# Patient Record
Sex: Male | Born: 1935 | Race: White | Hispanic: No | Smoking: Former smoker
Health system: Southern US, Community
[De-identification: ages and names within clinical notes are randomized; demographics above are authoritative.]

## PROBLEM LIST (undated history)

## (undated) DIAGNOSIS — N189 Chronic kidney disease, unspecified: Secondary | ICD-10-CM

## (undated) DIAGNOSIS — E7800 Pure hypercholesterolemia, unspecified: Secondary | ICD-10-CM

## (undated) DIAGNOSIS — R195 Other fecal abnormalities: Secondary | ICD-10-CM

## (undated) DIAGNOSIS — I10 Essential (primary) hypertension: Secondary | ICD-10-CM

## (undated) DIAGNOSIS — K589 Irritable bowel syndrome without diarrhea: Secondary | ICD-10-CM

## (undated) DIAGNOSIS — I2510 Atherosclerotic heart disease of native coronary artery without angina pectoris: Secondary | ICD-10-CM

## (undated) DIAGNOSIS — C801 Malignant (primary) neoplasm, unspecified: Secondary | ICD-10-CM

## (undated) DIAGNOSIS — K219 Gastro-esophageal reflux disease without esophagitis: Secondary | ICD-10-CM

## (undated) DIAGNOSIS — C61 Malignant neoplasm of prostate: Secondary | ICD-10-CM

## (undated) HISTORY — PX: OTHER SURGICAL HISTORY: SHX169

## (undated) HISTORY — DX: Chronic kidney disease, unspecified: N18.9

## (undated) HISTORY — PX: SKIN CANCER EXCISION: SHX779

## (undated) HISTORY — DX: Pure hypercholesterolemia, unspecified: E78.00

## (undated) HISTORY — DX: Atherosclerotic heart disease of native coronary artery without angina pectoris: I25.10

## (undated) HISTORY — PX: RADIOACTIVE SEED IMPLANT: SHX5150

## (undated) HISTORY — DX: Other fecal abnormalities: R19.5

## (undated) HISTORY — DX: Malignant neoplasm of prostate: C61

## (undated) HISTORY — DX: Irritable bowel syndrome, unspecified: K58.9

## (undated) HISTORY — DX: Essential (primary) hypertension: I10

## (undated) HISTORY — PX: CHOLECYSTECTOMY: SHX55

## (undated) HISTORY — DX: Malignant (primary) neoplasm, unspecified: C80.1

## (undated) HISTORY — DX: Gastro-esophageal reflux disease without esophagitis: K21.9

---

## 1998-06-12 ENCOUNTER — Inpatient Hospital Stay (HOSPITAL_COMMUNITY): Admission: EM | Admit: 1998-06-12 | Discharge: 1998-06-16 | Payer: Self-pay | Admitting: Emergency Medicine

## 1998-06-12 ENCOUNTER — Encounter: Payer: Self-pay | Admitting: Emergency Medicine

## 1998-06-13 ENCOUNTER — Encounter: Payer: Self-pay | Admitting: Emergency Medicine

## 1998-06-14 ENCOUNTER — Encounter: Payer: Self-pay | Admitting: General Surgery

## 2000-07-24 ENCOUNTER — Ambulatory Visit (HOSPITAL_COMMUNITY): Admission: RE | Admit: 2000-07-24 | Discharge: 2000-07-24 | Payer: Self-pay | Admitting: Ophthalmology

## 2002-03-08 ENCOUNTER — Ambulatory Visit: Admission: RE | Admit: 2002-03-08 | Discharge: 2002-06-04 | Payer: Self-pay | Admitting: Radiation Oncology

## 2002-03-25 ENCOUNTER — Encounter: Payer: Self-pay | Admitting: Urology

## 2002-03-25 ENCOUNTER — Encounter: Admission: RE | Admit: 2002-03-25 | Discharge: 2002-03-25 | Payer: Self-pay | Admitting: Urology

## 2002-05-03 ENCOUNTER — Encounter: Payer: Self-pay | Admitting: Urology

## 2002-05-03 ENCOUNTER — Ambulatory Visit (HOSPITAL_BASED_OUTPATIENT_CLINIC_OR_DEPARTMENT_OTHER): Admission: RE | Admit: 2002-05-03 | Discharge: 2002-05-03 | Payer: Self-pay | Admitting: Urology

## 2004-06-27 HISTORY — PX: CORONARY ARTERY BYPASS GRAFT: SHX141

## 2004-07-08 ENCOUNTER — Encounter: Admission: RE | Admit: 2004-07-08 | Discharge: 2004-10-06 | Payer: Self-pay | Admitting: Emergency Medicine

## 2004-07-15 ENCOUNTER — Inpatient Hospital Stay (HOSPITAL_BASED_OUTPATIENT_CLINIC_OR_DEPARTMENT_OTHER): Admission: RE | Admit: 2004-07-15 | Discharge: 2004-07-15 | Payer: Self-pay | Admitting: Cardiology

## 2004-07-19 ENCOUNTER — Inpatient Hospital Stay (HOSPITAL_COMMUNITY): Admission: AD | Admit: 2004-07-19 | Discharge: 2004-07-26 | Payer: Self-pay | Admitting: Cardiology

## 2004-09-28 ENCOUNTER — Encounter
Admission: RE | Admit: 2004-09-28 | Discharge: 2004-09-28 | Payer: Self-pay | Admitting: Thoracic Surgery (Cardiothoracic Vascular Surgery)

## 2005-10-17 ENCOUNTER — Encounter (INDEPENDENT_AMBULATORY_CARE_PROVIDER_SITE_OTHER): Payer: Self-pay | Admitting: Specialist

## 2005-10-17 ENCOUNTER — Ambulatory Visit (HOSPITAL_BASED_OUTPATIENT_CLINIC_OR_DEPARTMENT_OTHER): Admission: RE | Admit: 2005-10-17 | Discharge: 2005-10-17 | Payer: Self-pay | Admitting: Urology

## 2006-10-26 ENCOUNTER — Encounter: Payer: Self-pay | Admitting: Cardiology

## 2010-04-14 ENCOUNTER — Ambulatory Visit: Payer: Self-pay | Admitting: Cardiology

## 2010-11-12 NOTE — Cardiovascular Report (Signed)
NAME:  COSTA, JHA NO.:  [id number]   MEDICAL RECORD NO.:  [id number]          PATIENT TYPE:  OIB   LOCATION:  6501                         FACILITY:  [facility]   PHYSICIAN:  Peter M. Swaziland, M.D.  DATE OF BIRTH:  11/03/1932   DATE OF PROCEDURE:  07/15/2004  DATE OF DISCHARGE:                              CARDIAC CATHETERIZATION   INDICATIONS FOR PROCEDURE:  A 77 year old white male with history of  diabetes, hyperlipidemia, and hypertension who presents with symptoms of  dyspnea on exertion. He has an abnormal stress test.   PROCEDURES:  Left heart catheterization, coronary and left ventricular  angiography access via the right femoral artery using standard Seldinger  technique.   EQUIPMENT USED:  A  4-French, 4-cm, right-and-left Judkins catheter; 4-  French, pigtail catheter; 4-French arterial sheath   MEDICATIONS:  Local anesthesia 1% Xylocaine.   CONTRAST:  90 mL of Omnipaque.   HEMODYNAMIC DATA:  1.  Aortic pressure is 149/70 with mean of 101 mmHg.  2.  Left ventricular pressure was 142 with EDP of 15 mmHg.   ANGIOGRAPHIC DATA:  1.  The left coronary arises and distributes normally.  The left main      coronary is normal.  2.  The left anterior descending artery is heavily calcified proximally. It      is occluded after the takeoff of the first septal perforator. There are      good collaterals right-to-left from the right ventricular branch of the      right coronary. There also faint collaterals, left-to-left from the      obtuse marginal branch of the circumflex.  3.  The left circumflex coronary gives rise to a very large obtuse marginal      branch. Then continues and gives off a smaller obtuse marginal branch      before terminating in the AV groove.  In the proximal circumstance      circumflex, there is very complex 90% stenosis prior to the takeoff of      the first obtuse marginal with a large aneurysmal segment and large      aneurysmal  segment at the takeoff of the first obtuse marginal branch.      The first marginal branch is diffusely diseased up to 70% proximally. At      its bifurcation in the mid vessel there is a 90% bifurcation stenosis.      The left circumflex continues after the first marginal branch with      diffuse 50-60% stenosis.  4.  The right coronary artery rises and distributes in a dominant fashion.      In the mid vessel after the takeoff of the right ventricular marginal      branch, there is a complex 90% stenosis. This is followed by 95%      stenosis of the distal right coronary artery. The posterior descending      artery is a relatively small branch, but appears patent. The      posterolateral branch is occluded at its arch and fills by left-to-right  collaterals.  5.  Left ventricular angiography is performed in RAO view. This demonstrates      normal left ventricular size and contractility with normal systolic      function. Ejection fraction is estimated at 60%. There is no mitral      regurgitation or prolapse.   FINAL INTERPRETATION.:  1.  Severe 3-vessel, obstructive, atherosclerotic, coronary artery disease.  2.  Normal left ventricular function.   PLAN:  Recommend referral for coronary artery bypass surgery.       PMJ/MEDQ  D:  07/15/2004  T:  07/15/2004  Job:  [id number]   cc:   Dellis Anes. Idell Pickles, M.D.  [address]  [place]  [place] [address]  Fax: [phone]   CVTS

## 2010-11-12 NOTE — Op Note (Signed)
NAME:  Alexander Choi, Alexander Choi NO.:  [id number]   MEDICAL RECORD NO.:  [id number]          PATIENT TYPE:  INP   LOCATION:  2316                         FACILITY:  [facility]   PHYSICIAN:  Salvatore Decent. Dorris Fetch, M.D.DATE OF BIRTH:  1932/09/22   DATE OF PROCEDURE:  07/21/2004  DATE OF DISCHARGE:                                 OPERATIVE REPORT   PREOPERATIVE DIAGNOSIS:  Severe three-vessel coronary disease with unstable  angina.   POSTOPERATIVE DIAGNOSIS:  Severe three-vessel coronary disease with unstable  angina.   PROCEDURES:  1.  Median sternotomy.  2.  Extracorporeal circulation.  3.  Coronary artery bypass grafting x 4 (left internal mammary artery to      LAD, saphenous vein graft to acute marginal, sequential saphenous vein      graft to obtuse marginal 1 mid and distal).  4.  Endoscopic vein harvest, right thigh.   SURGEON:  Salvatore Decent. Dorris Fetch, M.D.   ASSISTANTEber Jones A. Eustaquio Boyden.   ANESTHESIA:  General.   FINDINGS:  Vein and mammary artery of good quality.  Targets of poor quality  and diffusely disease, except acute marginal, which was a good quality  vessel.  Distal right coronary and posterior descending not graftable.  Not  a candidate for redo surgery.   CLINICAL NOTE:  Alexander Choi is a 77 year old gentleman who had presented  initially with stable angina.  He had cardiac catheterization which revealed  severe three-vessel disease with diffusely disease, poorly quality target  vessels.  The patient was referred for consideration for coronary artery  bypass graft.  The indications, risks, benefits and alternatives had been  discussed in detail with the patient.  He was subsequently admitted to the  hospital with unstable angina in the interim between his office visit and  his scheduled surgery.  The patient understood and accepted the risks of the  procedure.  He and his family understood that with the poor target vessels  he was at high risk  for early and late graft failure.  He agreed to proceed  with surgery.   OPERATIVE NOTE:  Alexander Choi was brought to the preoperative holding area on  July 21, 2004.  There lines were placed by the anesthesia service to  monitor arterial __________ and pulmonary arterial pressure.  EKG leads were  placed for continuous telemetry.  Intravenous antibiotics were administered.  The patient was taken to the operating room, anesthetized and intubated.  A  Foley catheter was placed.  The chest, abdomen and legs were prepped and  draped in usual fashion.   An incision was made in the medial aspect of the right leg at the level of  the knee.  The greater saphenous vein was identified and was harvested  endoscopically from the right thigh.  An additional short segment was  harvested from just below the knee also endoscopically.  The vein was of  good quality.   Simultaneously a medial sternotomy was performed and the left internal  mammary artery was harvested using standard technique.  The mammary was a  good quality conduit.  The patient  was fully heparinized prior to dividing  the distal end of the mammary and there was excellent flow through the cut  end of the vessel.   The pericardium was opened after ensuring adequate anticoagulation with ACT  measurement.  The ascending aorta was inspected and palpated.  There was no  palpable atherosclerotic disease.  The aorta was cannulated via concentric 2-  0 Ethibond pledgeted pursestring sutures.  A dual-stage venous cannula was  placed via pursestring suture in the right atrial appendage.  Cardiopulmonary bypass was instituted and the patient was cooled to 32  degrees Celsius.  The coronary arteries were inspected.  The LAD was heavily  calcified and diffusely diseased.  OM1 was more heavily calcified than  appreciated on the catheterization films and also was diffusely diseased,  including a lesion a bifurcation before two branches.  The  distal right  coronary artery was heavily calcified.  The posterior descending was a small  nongraftable vessel as it appeared on catheterization.  The large acute  marginal branch of the right, which also supplied some of the apical LAD  collaterals, was the only good quality vessel.  The conduits were inspected  and cut to length.  The foam pad was placed in the pericardium to protect  the left phrenic nerve.  A temperature probe was placed in the myocardium  septum.  A cardioplegia cannula was placed in the ascending aorta and a  retrograde cardioplegia cannula was placed via pursestring suture in the  right atrium and directed into the coronary sinus.   The aorta was cross clamped.  The left ventricle was emptied via the aortic  root.  Cardiac arrest was then achieved with a combination of cold antegrade  and retrograde blood cardioplegia and topical iced saline.  After achieving  a complete diastolic arrest and adequate myocardium septum cooling, the  following distal anastomoses were performed:   First, a reverse saphenous vein graft was placed end to side to the acute  marginal branch of the right coronary.  This was a 1.5 mm good quality  vessel at the site of the anastomosis.  The vein graft was of good quality.  The anastomosis was performed with a running 7-0 Prolene suture.  There was  excellent flow through the graft.  Cardioplegia was administered.  There was  good hemostasis.   Next, a reverse saphenous vein graft was placed sequentially to the first  obtuse marginal vessel.  The vein was first opened at a side branch and  anastomosed side to side to the mid portion of OM1.  The distal end of the  vein then was anastomosed to the superior branch of the bifurcation beyond  another tight stenosis.  The OM was a diffusely diseased, poor quality  vessel.  Both anastomoses were performed with running 7-0 Prolene sutures and both were probed proximally and distally to ensure  patency before tying  the suture.  There was good flow through the graft.  Cardioplegia was  administered.  A small leak for the heel of the distal anastomosis was fixed  with a single 7-0 Prolene suture.   Next, a window was made in the pericardium.  The left internal mammary  artery was brought through the pericardium.  The distal end was prepared for  grafting.  It was a 2 mm good quality conduit.  It was anastomosed end to  side to the distal LAD.  The LAD was diffusely diseased and heavily  calcified throughout.  It was  1.5 mm in diameter at the site of the  anastomosis, but only a 1 mm probe would pass proximally and distally.  The  end to side anastomosis was performed with a running 8-0 Prolene suture.  At  the completion of the anastomosis, bulldog clamps were briefly removed.  Immediate and rapid septal rewarming was noted.  The bulldog clamp was  replaced.   Additional cardioplegia then was administered.  The vein grafts were cut to  length and the proximal vein graft anastomoses were performed to 4.4 mm  punch aortotomies with running 6-0 Prolene sutures.  At the completion of  the final proximal anastomosis, lidocaine was administered.  The patient was  placed on Trendelenburg position.  The aortic root was deaired.  Retrograde  cardioplegia was administered.  The bulldog clamp was again removed from the  left mammary artery.  The balloon on the retrograde catheter was then  deflated and the aortic cross clamp was removed.  The total cross clamp time  was 73 minutes.   While the patient was being rewarmed, all proximal and distal anastomoses  were inspected for hemostasis.  The patient did require single  defibrillation with 20 joules, but remained in sinus rhythm thereafter.  A  low-dose dopamine infusion was initiated.  Epicardial pacing wires were  placed on the right ventricle and right atrium and the patient was rewarmed  to 37 degree Celsius.  He was weaned from  cardiopulmonary bypass without  difficulty.  The total bypass time was 121 minutes.  The initial cardiac  index was approximately 1.5 L per min/sq m, but responded immediately to  volume resuscitation.  The patient remained hemodynamically stable  thereafter.   A test dose of protamine was administered and was well tolerated.  The  atrial and aortic cannulas were removed.  The remainder of the protamine was  administered without incident.  The chest was irrigated with 1 L of warm  normal saline containing 1 g of vancomycin.  Hemostasis was achieved.  A  left pleural and two mediastinal chest tubes were placed through separate  subcostal incisions.  The sternum was closed with heavy gauge stainless  steel wires.  The remainder of the incisions were closed in standard  fashion.  Subcuticular closure was used for the skin.  All sponge, needle  and instrument counts were correct at the end of the procedure.  The patient was taken from the operating room to the surgical intensive care unit in  critical, but stable condition.      SCH/MEDQ  D:  07/21/2004  T:  07/21/2004  Job:  [id number]   cc:   Reuben Likes, M.D.  [address]  [place]  [place] [address]  Fax: [phone]

## 2010-11-12 NOTE — Op Note (Signed)
NAME:  Alexander Choi, Alexander Choi               ACCOUNT NO.:  [id number]   MEDICAL RECORD NO.:  [id number]          PATIENT TYPE:  AMB   LOCATION:  [facility]                         FACILITY:  [facility]   PHYSICIAN:  Mark C. Vernie Ammons, M.D.  DATE OF BIRTH:  03/26/1933   DATE OF PROCEDURE:  10/17/2005  DATE OF DISCHARGE:                                 OPERATIVE REPORT   PREOP DIAGNOSIS:  Gross hematuria.   POSTOP DIAGNOSES:  1.  Gross hematuria.  2.  Bladder lesion.  3.  Bladder stone (approx. 1 cm.).   PROCEDURE:  Cystoscopy with clot evacuation, bladder biopsy and removal of  bladder stone.   SURGEON:  Mark C. Vernie Ammons, M.D.   ANESTHESIA:  General.   SPECIMEN:  Stone to patient and bladder biopsy to pathology.   BLOOD LOSS:  Minimal.   COMPLICATIONS:  None.   DRAINS:  None.   COMPLICATIONS:  None.   INDICATIONS:  The patient is a 77 year old white male whose had a history of  radiation for prostate cancer. He has also had a prior TURP. He developed  gross hematuria. Upper tract evaluation revealed normal upper tracts and  cystoscopy in my office revealed a large clot in his bladder. He has gone  into clot retention two times, has an indwelling Foley at this time. He had  been on Levaquin.  His workup has revealed in addition to the normal upper  tract by CT scan, his hemoglobin was slightly low at 10.6 with hematocrit of  30.7, platelets were normal at 339. His PT is 13.3, PTT 35 and INR 1.0.  His  electrolytes and liver function tests were entirely normal with a creatinine  of 1.0.  He is brought to the operating room today for anesthetic cysto,  clot evacuation, and biopsy of any lesion in the bladder as well as  fulguration of any bleeding points. The risks, complications and  alternatives have discussed.  He understands and elected to proceed.   DESCRIPTION OF OPERATION:  After informed consent, the patient was brought  to the major OR, placed on the table and administered general  anesthesia in  moved to the dorsal lithotomy position. Genitalia was sterilely prepped and  draped. Initially a 22-French cystoscope was introduced per urethra and the  no new findings were noted from cystoscopy in my office. There was a lot of  bloody urine in the bladder and large clot precluding visualization.   The Ellik evacuator was then used to evacuate a large volume of gelatinous  clot from his bladder. Eventually I was able to clear all clot from the  bladder with the Ellik.   Repeat cystoscopy then was performed with both 12 and 70 degrees lenses. The  bladder was fully inspected. On the posterior wall there appeared to be some  bolus inflammatory changes most likely from the presence of his Foley  catheter. The remainder of the bladder was fully inspected and noted to be  free of any tumor, stones or inflammatory lesions. The ureteral orifices  were normal configuration and position. I then began withdrawing the scope  to  reevaluate the bladder neck and prostatic urethral region. Just inside  the bladder neck and the prostatic urethra, at the 5 o'clock position was a  prominent stone affixed at the bladder neck. I grasped this with alligator  forceps and was able to remove it intact. In doing so there was some  bleeding noted from where it had been attached and I then fulgurated this  with the Bugbee electrode. On the contralateral side, one could see bleeding  where the stone had been rubbing against the bladder neck and likely the  cause of his continued hematuria. I fulgurated this area as well. I then  inserted the cold cup biopsy forceps and obtained a biopsy from the direct  posterior wall of the bladder in the area of the lesion which appeared to be  edema. This was fulgurated with the Bugbee as well.   The end of the procedure no further bleeding was identified within the  bladder or prostatic urethra. I therefore drained the bladder, removed the  cystoscope and the  patient was awakened and taken to recovery room in stable  satisfactory condition. He tolerated procedure well. He will be given  prescription for Pyridium 200 milligrams #28 and Vicodin ES #24. He follow-  up my office in 2 weeks. He will remain off his aspirin and his Naprosyn for  five more days. His Foley catheter will be left out.  He will contact me if  he has any difficulty. Otherwise I will contact him with results of his  biopsy.      Mark C. Vernie Ammons, M.D.  Electronically Signed     MCO/MEDQ  D:  10/17/2005  T:  10/18/2005  Job:  [id number]

## 2010-11-12 NOTE — Op Note (Signed)
NAME:  Alexander Choi, Alexander Choi                         ACCOUNT NO.:  [id number]   MEDICAL RECORD NO.:  [id number]                   PATIENT TYPE:  AMB   LOCATION:  N[facility]C                                 [facility]ACILITY:  [facility]   PHYSICIAN:  Mark C. Vernie Ammons, M.D.               DATE OF BIRTH:  1932/09/01   DATE OF PROCEDURE:  05/03/2002  DATE OF DISCHARGE:                                 OPERATIVE REPORT   PREOPERATIVE DIAGNOSIS:  Adenocarcinoma of the prostate.   POSTOPERATIVE DIAGNOSIS:  Adenocarcinoma of the prostate.   PROCEDURE:  I-125 seed implantation.   SURGEON:  Mark C. Vernie Ammons, M.D.   RADIATION ONCOLOGIST:  Billie Lade, M.D.   ANESTHESIA:  General LMA.   BLOOD LOSS:  Less than 10 cc.   DRAINS:  16 French Foley catheter.   SPECIMENS:  None.   COMPLICATIONS:  None.   INDICATIONS FOR PROCEDURE:  The patient is a 77 year old white male with  Gleason 6 adenocarcinoma of the prostate, found in 5% of the specimen from  the right lobe.  That was performed due to a PSA elevation to 9 with a ratio  of 16% but no evidence of extracapsular disease by ultrasound and a smooth,  symmetric prostate on exam with no nodularity.  He is brought to the OR  today for radioactive seed implant.  I have discussed all of the treatment  options, and these are outlined in my office noted which have been placed in  the chart.   DESCRIPTION OF OPERATION:  After informed consent, the patient brought to  the major OR and placed on the table and administered general anesthesia,  then moved to the dorsal lithotomy position.  His genitalia was sterilely  prepped and draped, and a 16 French Foley catheter was inserted in the  bladder with dilute contrast filling the balloon and a rectal tube placed.  The ultrasound probe was then affixed to the table and placed within the  rectum.  Using the preoperative planning ultrasound, the prostate was  positioned in an identical position, and the real-time fluoroscopy  as well  as ultrasound were then used to place the seeds after two stabilizing  needles were inserted.  A total of 24 needles were used to place 97 seeds.   Flexible cystoscopy was then performed, and I noted the urethra was entirely  normal down to the sphincter.  At the verumontanum, I could see an area of  Vicryl traversing the prostatic urethra.  I saw also two strands protruding  from the prostate into the TUR defect.   The 21 French rigid scope and 12 degree lens were then inserted with the  cystoscope scissors, and I cut the Vicryl away from the strand that was  traversing the prostatic urethra at the level of the verumontanum, leaving  the seeds in place, but removing the intervening Vicryl so that no source of  irritation  would be left within the prostate.  Then the strands that were  protruding into the bladder were also cut flush with the prostate.  No other  strands were identified protruding, and I therefore drained the bladder and  reinserted the 16 French Foley catheter, and the patient was awakened and  taken to the recovery room in stable, satisfactory condition.  He tolerated  the procedure well.  There were no intraoperative complications, and after  removal of the strands, there were a total of 91 seeds that were remaining.   DISPOSITION:  He will be given a prescription for 250 mg of Levaquin for one  week as well as Flomax 0.4 mg q.d. for a week and Vicodin ES #28.  He will  return to my office in three weeks for follow-up.                                               Mark C. Vernie Ammons, M.D.    MCO/MEDQ  D:  05/03/2002  T:  05/03/2002  Job:  [id number]   cc:   Billie Lade, M.D.  [address] - [facility]  [place]  [place]  [phone]  Fax: [phone]

## 2010-11-12 NOTE — H&P (Signed)
NAME:  Alexander Choi, Alexander Choi NO.:  [id number]   MEDICAL RECORD NO.:  [id number]          PATIENT TYPE:  AMB   LOCATION:                               FACILITY:  [facility]   PHYSICIAN:  Peter M. Swaziland, M.D.  DATE OF BIRTH:  Oct 28, 1932   DATE OF ADMISSION:  07/15/2004  DATE OF DISCHARGE:                                HISTORY & PHYSICAL   Alexander Choi is a pleasant 77 year old white male who is seen for evaluation  of dyspnea.  He states that since prior to Christmas this year he has had  symptoms of shortness of breath.  It is associated with some numbness in his  left arm.  He has report of a lot of indigestion-like symptoms.  He has  multiple cardiac risk factors.  On subsequent stress Cardiolite study, the  patient had a very poor exercise tolerance associated with symptoms of chest  tightness and dyspnea.  He had been in a low heart rate.  He had marked ST  segment depression inferolaterally.  Remarkably, subsequent Cardiolite  images were fairly unremarkable and ejection fraction was estimated at 64%.  However, given his poor exercise response and multiple risk factors and  symptoms, it is felt that he has a high likelihood of having significant  obstructive disease and cardiac catheterization is recommended.   PAST MEDICAL HISTORY:  1.  Hypercholesterolemia.  2.  Diabetes mellitus type 2.  3.  Hypertension.  4.  Chronic bowel problems.  5.  History of bowel obstruction, status post surgery 27 years ago.  6.  Status post prostate surgery with seed implants.  7.  Removal of skin cancer.  8.  Status post cholecystectomy.   ALLERGIES:  No known drug allergies.  There is some history of intolerance  to ACE INHIBITORS with a cough.  Also sublingual NITROGLYCERIN in the past  and made him extremely ill.   CURRENT MEDICATIONS:  1.  Avapro 150 mg per day.  2.  Nexium 40 mg per day.  3.  Nabumetone 500 mg per day.  4.  Zocor 20 mg per day.  5.  Glipizide 2.5 mg  daily.   SOCIAL HISTORY:  The patient is retired from the [employer].  [employer].  He is married.  He has three children in good health.  He  formerly smoked greater than three packs per day but quit 27 years ago.  Denies alcohol use.  He walks and swims for exercise.   FAMILY HISTORY:  His father died at age 80 with colon cancer and had several  heart attacks in his 82s.  Mother had a history of hypertension and died of  leukemia.  One brother  has epilepsy and high cholesterol.  One sister has a  sleep disorder and one sister has had coronary bypass surgery in her 48s.   REVIEW OF SYMPTOMS:  She denies any edema, orthopnea or PND.  She does  complain of a lot of indigestion.  He also states that he bleeds and bruises  easily.  He has no history of TIA or stroke.  Other review of systems are  negative.   PHYSICAL EXAMINATION:  VITAL SIGNS:  Weight is 201, blood pressure 140/80,  pulse 80 and regular.  HEENT:  Pupils are equal, round and reactive to light.  Sclerae are  anicteric.  Extraocular movements are full.  Oropharynx is clear.  NECK:  Without JVD, adenopathy, thyromegaly or bruits.  LUNGS:  Clear to auscultation and percussion.  CARDIOVASCULAR:  Regular rate and rhythm without gallop, murmur or click.  ABDOMEN:  Soft and nontender.  There are no masses or bruits.  EXTREMITIES:  Without edema.   LABORATORY DATA:  ECG shows normal sinus rhythm and is normal at rest.   IMPRESSION:  1.  Symptoms of dyspnea and chest tightness consistent with angina, markedly      abnormal stress response to exercise.  2.  Hypercholesterolemia.  3.  Diabetes mellitus type 2.  4.  Hypertension.  5.  Previous bowel surgery.  6.  History of prostate carcinoma.  7.  Remote history of tobacco use.  8.  Family history of coronary disease.   PLAN:  Proceed with coronary angiography.        ___________________________________________  Peter M. Swaziland, M.D.    PMJ/MEDQ   D:  07/12/2004  T:  07/12/2004  Job:  [id number]   cc:   Reuben Likes, M.D.  [address]  [place]  [place] [address]  Fax: [phone]

## 2010-11-12 NOTE — Discharge Summary (Signed)
NAME:  Alexander Choi, CURE NO.:  [id number]   MEDICAL RECORD NO.:  [id number]          PATIENT TYPE:  INP   LOCATION:  2012                         FACILITY:  [facility]   PHYSICIAN:  Salvatore Decent. Dorris Fetch, M.D.DATE OF BIRTH:  09-18-1932   DATE OF ADMISSION:  07/19/2004  DATE OF DISCHARGE:  07/26/2004                                 DISCHARGE SUMMARY   ADMITTING DIAGNOSIS:  Severe three-vessel coronary artery disease with  unstable angina.   PAST MEDICAL HISTORY:  1.  Severe three-vessel coronary artery disease, scheduled for elective      coronary artery bypass graft January 30.  2.  Diabetes mellitus type 2.  3.  Hypertension.  4.  Hypercholesterolemia.   SURGICAL HISTORY:  1.  Abdominal surgery for bowel obstruction 27 years ago.  2.  Prostate surgery with seed implants.  3.  Removal of skin cancer.  4.  Cholecystectomy.   ALLERGIES:  The patient has no known drug allergies; however, he has  intolerance to ACE inhibitors resulting in a cough.  Also, sublingual  nitroglycerin has made him ill in the past.   DISCHARGE DIAGNOSIS:  Severe three-vessel coronary artery disease, status  post coronary artery bypass graft.   BRIEF HISTORY:  Mr. Hammond is a 77 year old Caucasian man.  He presented  with symptoms of shortness of breath as well as indigestion-like symptoms.  Dr. Swaziland recommended Cardiolite study.  This was abnormal.  He then  recommended proceeding with cardiac catheterization.  This was performed on  July 15, 2002.  This revealed severe three-vessel coronary artery disease  with normal left ventricular function.  As his lesions are not amenable to  PCI, cardiac surgery consultation was requested.  Mr. Prajapati was evaluated  by Dr. Viviann Spare C. Dorris Fetch, at the [facility] office on July 16, 2004.  After  examination of the patient and review of all the available records, Dr.  Dorris Fetch agreed that coronary artery bypass grafting was the preferred  treatment choice of this gentleman.  The procedure, risks and benefits were  all discussed with Mr. Allred, including that his coronary disease is very  severe and diffuse, and that he will be a difficult bypass candidate.  Mr.  Kissoon agreed to proceed with surgery.  He was scheduled for January 30.  However, prior to his scheduled surgery date, Mr. Ruta developed unstable  angina and was admitted to [facility].   HOSPITAL COURSE:  On July 19, 2004, Mr. Matheney presented to [facility] with symptoms of increasing chest tightness at rest.  He was  evaluated by Dr. Swaziland who felt the best course of action was admission for  medical stabilization prior to proceeding with coronary artery bypass graft.  Mr. Hannan was evaluated the next morning by Dr. Dorris Fetch.  He was added  to the surgical schedule for Wednesday, January 25.  Mr. Tucker remained  stable in the hospital, awaiting surgery.   On July 21, 2004, Mr. Ladona Ridgel underwent the following surgical procedure  by Dr. Viviann Spare C. Hendrickson:  Coronary artery bypass grafting x4.  Grafts  placed at the  time of the procedure:  Left internal mammary artery graft to  the left anterior descending artery, saphenous vein graft in a sequential  fashion to the mid OM and distal OM artery, saphenous vein was grafted to  the acute marginal artery.  Vein was harvested from the right thigh via the  endo vein harvesting technique.   CLINICAL NOTE:  As expected, Mr. Hymes had very poor targets for bypass.  He is not a redo candidate.  Mr. Farver tolerated this procedure well,  transferring in stable condition to the SICU.  He remained hemodynamically  stable in the immediate postoperative period.  He was extubated several  hours after arrival into the intensive care unit and awoke from anesthesia  neurologically intact.  Mr. Stancil required only routine care in the  intensive care unit.  He was ready for transfer to Unit 2000  on  postoperative day #2.   Mr. Mckinney postoperative course has been uneventful, and he is making very  good progress in recovering from his surgery.  This morning, July 25, 2004, on rounds, Mr. Wanzer reported feeling very well.  His vital signs are  stable.  His blood pressure is 119/67.  He is afebrile.  Room air saturation  is 90%.  His heart is maintained in normal sinus rhythm.  His lungs are  clear to auscultation.  His incisions are healing well.  He is tolerating  his diet.  Bowel and bladder functions are within normal limits for him.  He  is ambulating with the cardiac rehabilitation service and minimal  assistance.  His pain is well controlled.  If Mr. Dipasquale remains stable, it  is anticipated he will be ready for discharge home tomorrow, July 26, 2004.   LABORATORY DATA:  January 28:  Chemistries include sodium 138, potassium  4.0, BUN 30, creatinine 0.9, glucose 99.  January 27:  CBC:  White blood  cells 7.6, hemoglobin 8.2, hematocrit 23, platelets 151.  Hemoglobin A1c on  admission January 24, 7.4.   DISCHARGE CONDITION:  Improved.   DISCHARGE MEDICATIONS:  1.  Enteric-coated aspirin, 325 mg daily.  2.  Lopressor 25 mg b.i.d.  3.  Zocor 20 mg daily.  4.  Glipizide 2.5 mg daily.  5.  Nexium 40 mg daily.  6.  Lasix 40 mg daily for the next five days.  7.  Potassium chloride 20 mEq daily for the next five days.  8.  For pain management, Tylox one to two p.o. q.4h for moderate to severe      pain, or Tylenol 325 mg one to two p.o. q.4h. for mild pain.   DISCHARGE INSTRUCTIONS:  1.  Discharge activity:  He is asked to refrain from any driving or heavy      lifting, pushing or pulling.  He is also instructed to continue his      breathing exercises and daily walking.  2.  Discharge diet:  A carbohydrate modified medium calorie diet.  3.  Wound care:  He is to shower daily with mild soap and water.  If his     incisions show any sign of infection, he is to  call Dr. Sunday Corn      office.   FOLLOWUP:  1.  He needs to see Dr. Swaziland in approximately two weeks.  He has been      asked to call to arrange this appointment, and given the office phone      number.  2.  He will  have a chest x-ray taken at that appointment.  3.  Dr. Dorris Fetch would like to see him back at the [facility] office in three      weeks.  The office will call with the date and time for that      appointment.  He will be asked to bring the chest x-ray from Dr.      Elvis Coil office for Dr. Dorris Fetch to review.      CTK/MEDQ  D:  07/25/2004  T:  07/25/2004  Job:  [id number]   cc:   Peter M. Swaziland, M.D.  [address], [address]  [place], [place] [address]  Fax: [phone]   Reuben Likes, M.D.  [address]  [place]  [place] [address]  Fax: [phone]

## 2011-03-14 ENCOUNTER — Encounter: Payer: Self-pay | Admitting: Cardiology

## 2011-03-23 ENCOUNTER — Ambulatory Visit (INDEPENDENT_AMBULATORY_CARE_PROVIDER_SITE_OTHER): Payer: Medicare Other | Admitting: Cardiology

## 2011-03-23 ENCOUNTER — Encounter: Payer: Self-pay | Admitting: Cardiology

## 2011-03-23 DIAGNOSIS — E119 Type 2 diabetes mellitus without complications: Secondary | ICD-10-CM

## 2011-03-23 DIAGNOSIS — E7800 Pure hypercholesterolemia, unspecified: Secondary | ICD-10-CM

## 2011-03-23 DIAGNOSIS — I10 Essential (primary) hypertension: Secondary | ICD-10-CM

## 2011-03-23 DIAGNOSIS — I2510 Atherosclerotic heart disease of native coronary artery without angina pectoris: Secondary | ICD-10-CM

## 2011-03-23 NOTE — Patient Instructions (Signed)
Continue your current medications.  Watch your weight. Keep up with your exercise.  I will see you again in 6 months.

## 2011-03-24 ENCOUNTER — Encounter: Payer: Self-pay | Admitting: Cardiology

## 2011-03-24 DIAGNOSIS — E119 Type 2 diabetes mellitus without complications: Secondary | ICD-10-CM | POA: Insufficient documentation

## 2011-03-24 DIAGNOSIS — I10 Essential (primary) hypertension: Secondary | ICD-10-CM | POA: Insufficient documentation

## 2011-03-24 DIAGNOSIS — E7800 Pure hypercholesterolemia, unspecified: Secondary | ICD-10-CM | POA: Insufficient documentation

## 2011-03-24 DIAGNOSIS — I2510 Atherosclerotic heart disease of native coronary artery without angina pectoris: Secondary | ICD-10-CM | POA: Insufficient documentation

## 2011-03-24 NOTE — Assessment & Plan Note (Signed)
Blood pressure control is excellent on his current regimen. 

## 2011-03-24 NOTE — Progress Notes (Signed)
Alexander Choi Date of Birth: 03/28/1933   History of Present Illness: [name] is seen for followup today. He has a history of coronary disease and is status post coronary bypass surgery in 2006. This included an LIMA graft to LAD, saphenous vein graft sequentially to the mid and distal obtuse marginal vessel, and saphenous vein graft to the acute marginal branch of the right coronary. He continues to do very well. He denies any cardiac complaints. He specifically denies any chest pain, shortness of breath, or palpitations. He stays active.  Current Outpatient Prescriptions on File Prior to Visit  Medication Sig Dispense Refill  . aspirin 81 MG tablet Take 81 mg by mouth daily.        Marland Kitchen glipiZIDE (GLUCOTROL XL) 2.5 MG 24 hr tablet Take 2.5 mg by mouth daily.        . metoprolol succinate (TOPROL-XL) 25 MG 24 hr tablet Take 25 mg by mouth daily.        . Omega-3 Fatty Acids (FISH OIL PO) Take by mouth.        . simvastatin (ZOCOR) 40 MG tablet Take 1 tablet by mouth Daily.      . valsartan-hydrochlorothiazide (DIOVAN-HCT) 160-12.5 MG per tablet Take 1 tablet by mouth daily.          Allergies  Allergen Reactions  . Ace Inhibitors   . Nitrates, Organic     Past Medical History  Diagnosis Date  . Coronary artery disease   . Diabetes mellitus     Type 2  . Hypertension   . Hypercholesterolemia   . Cancer   . Prostate cancer   . Loose stools   . Chronic kidney disease     kidney stones  . Hematuria   . Urinary obstruction     Past Surgical History  Procedure Date  . Coronary artery bypass graft 2006  . Other surgical history     bowel obstruction  . Radioactive seed implant     for prostate cancer  . Cholecystectomy   . Skin cancer excision     History  Smoking status  . Former Smoker -- 3.0 packs/day  Smokeless tobacco  . Not on file    History  Alcohol Use No    Family History  Problem Relation Age of Onset  . Cancer Mother     Leukemia  .  Hypertension Mother   . Cancer Father     Colon  . Heart attack Father     Had Several in his 45's  . Hypertension Father   . Diabetes Father     Review of Systems: The review of systems is positive for stopping his metformin because of frequent loose stools. His blood sugars have been well controlled. All other systems were reviewed and are negative.  Physical Exam: BP 110/62  Pulse 58  Ht 5\' 10"  (1.778 m)  Wt 204 lb (92.534 kg)  BMI 29.27 kg/m2 The patient is alert and oriented x 3.  The mood and affect are normal.  The skin is warm and dry.  Color is normal.  The HEENT exam reveals that the sclera are nonicteric.  The mucous membranes are moist.  The carotids are 2+ without bruits.  There is no thyromegaly.  There is no JVD.  The lungs are clear.  The chest wall is non tender.  The heart exam reveals a regular rate with a normal S1 and S2.  There are no murmurs, gallops, or rubs.  The PMI is not displaced.   Abdominal exam reveals good bowel sounds.  There is no guarding or rebound.  There is no hepatosplenomegaly or tenderness.  There are no masses.  Exam of the legs reveal no clubbing, cyanosis, or edema.  The legs are without rashes.  The distal pulses are intact.  Cranial nerves II - XII are intact.  Motor and sensory functions are intact.  The gait is normal.  LABORATORY DATA: From April of this year his total cholesterol was 118, triglycerides 102, HDL 33, and LDL 65.  Assessment / Plan:

## 2011-03-24 NOTE — Assessment & Plan Note (Signed)
He is asymptomatic status post CABG in 2006. His last Myoview study was in 2008. We will consider a followup stress test next year.

## 2011-03-24 NOTE — Assessment & Plan Note (Signed)
Lipid parameters are very good. He will continue with simvastatin.

## 2011-04-29 ENCOUNTER — Encounter: Payer: Self-pay | Admitting: Cardiology

## 2011-10-26 ENCOUNTER — Ambulatory Visit: Payer: Medicare Other | Admitting: Cardiology

## 2011-10-26 ENCOUNTER — Encounter: Payer: Self-pay | Admitting: Cardiology

## 2011-10-26 ENCOUNTER — Ambulatory Visit (INDEPENDENT_AMBULATORY_CARE_PROVIDER_SITE_OTHER): Payer: Medicare Other | Admitting: Cardiology

## 2011-10-26 VITALS — BP 115/76 | HR 58 | Ht 70.0 in | Wt 204.8 lb

## 2011-10-26 DIAGNOSIS — E785 Hyperlipidemia, unspecified: Secondary | ICD-10-CM

## 2011-10-26 DIAGNOSIS — E7800 Pure hypercholesterolemia, unspecified: Secondary | ICD-10-CM

## 2011-10-26 DIAGNOSIS — E119 Type 2 diabetes mellitus without complications: Secondary | ICD-10-CM

## 2011-10-26 DIAGNOSIS — I2510 Atherosclerotic heart disease of native coronary artery without angina pectoris: Secondary | ICD-10-CM

## 2011-10-26 DIAGNOSIS — I10 Essential (primary) hypertension: Secondary | ICD-10-CM

## 2011-10-26 NOTE — Patient Instructions (Signed)
We will schedule you for a nuclear stress test.  Continue your other therapy.  I will see you again in six months.

## 2011-10-27 NOTE — Assessment & Plan Note (Signed)
He has persistently low HDL cholesterol. We are currently conducting a clinical trial of a new CTEP inhibitor. Alexander Choi is very interested in participating in this study and we will have our study coordinator discuss this with him.

## 2011-10-27 NOTE — Assessment & Plan Note (Signed)
Blood pressure control is excellent. We will continue with his current Diovan HCT and metoprolol.

## 2011-10-27 NOTE — Assessment & Plan Note (Signed)
He remains asymptomatic. His last evaluation with stress testing was in May of 2008. Since he is over 5 years out from his bypass surgery we will schedule him for a followup LexiScan Myoview study.

## 2011-10-27 NOTE — Progress Notes (Signed)
Alexander Choi Date of Birth: 1932-07-10   History of Present Illness: Alexander Choi is seen for followup today. He has a history of coronary disease and is status post coronary bypass surgery in 2006. This included an LIMA graft to LAD, saphenous vein graft sequentially to the mid and distal obtuse marginal vessel, and saphenous vein graft to the acute marginal branch of the right coronary. He reports that he is doing very well today. His Zocor was switched to Lipitor. He reports that he stopped his metformin because of some diarrhea but when his sugar started going up again he resumed it and has been doing well. He has been having more arthritic trouble with his right knee and has received some injections.  Current Outpatient Prescriptions on File Prior to Visit  Medication Sig Dispense Refill  . aspirin 81 MG tablet Take 81 mg by mouth daily.        Marland Kitchen atorvastatin (LIPITOR) 40 MG tablet Take 40 mg by mouth daily.      Marland Kitchen glyBURIDE (DIABETA) 5 MG tablet Take 5 mg by mouth 2 (two) times daily with a meal.       . metFORMIN (GLUCOPHAGE) 500 MG tablet Take 500 mg by mouth as directed.      . metoprolol succinate (TOPROL-XL) 25 MG 24 hr tablet Take 25 mg by mouth daily.        . valsartan-hydrochlorothiazide (DIOVAN-HCT) 160-12.5 MG per tablet Take 1 tablet by mouth daily.          Allergies  Allergen Reactions  . Ace Inhibitors   . Nitrates, Organic     Past Medical History  Diagnosis Date  . Coronary artery disease   . Diabetes mellitus     Type 2  . Hypertension   . Hypercholesterolemia   . Cancer   . Prostate cancer   . Loose stools   . Chronic kidney disease     kidney stones  . Hematuria   . Urinary obstruction     Past Surgical History  Procedure Date  . Coronary artery bypass graft 2006  . Other surgical history     bowel obstruction  . Radioactive seed implant     for prostate cancer  . Cholecystectomy   . Skin cancer excision     History  Smoking status  .  Former Smoker -- 3.0 packs/day  Smokeless tobacco  . Not on file    History  Alcohol Use No    Family History  Problem Relation Age of Onset  . Cancer Mother     Leukemia  . Hypertension Mother   . Cancer Father     Colon  . Heart attack Father     Had Several in his 25's  . Hypertension Father   . Diabetes Father     Review of Systems: The review of systems is positive arthralgias in his right knee. He still walks daily. All other systems were reviewed and are negative.  Physical Exam: BP 115/76  Pulse 58  Ht 5\' 10"  (1.778 m)  Wt 204 lb 12.8 oz (92.897 kg)  BMI 29.39 kg/m2 The patient is alert and oriented x 3.  The skin is warm and dry.  Color is normal.  The HEENT exam reveals that the sclera are nonicteric.  The mucous membranes are moist.  The carotids are 2+ without bruits.  There is no thyromegaly.  There is no JVD.  The lungs are clear.  The chest wall is non  tender.  The heart exam reveals a regular rate with a normal S1 and S2.  There are no murmurs, gallops, or rubs.  The PMI is not displaced.   Abdominal exam reveals good bowel sounds.  There is no guarding or rebound.  There is no hepatosplenomegaly or tenderness.  There are no masses.  Exam of the legs reveal no clubbing, cyanosis, or edema.  The legs are without rashes.  The distal pulses are intact.  Cranial nerves II - XII are intact.  Motor and sensory functions are intact.  The gait is normal.  LABORATORY DATA: ECG today shows sinus bradycardia with a rate of 58 beats per minute. It is otherwise normal. We reviewed his last lipid data which showed a persistently low HDL but his other parameters were fairly good.  Assessment / Plan:

## 2011-11-07 ENCOUNTER — Ambulatory Visit (HOSPITAL_COMMUNITY): Payer: Medicare Other | Attending: Cardiology | Admitting: Radiology

## 2011-11-07 VITALS — BP 151/65 | Ht 70.0 in | Wt 202.0 lb

## 2011-11-07 DIAGNOSIS — I2510 Atherosclerotic heart disease of native coronary artery without angina pectoris: Secondary | ICD-10-CM

## 2011-11-07 DIAGNOSIS — Z8249 Family history of ischemic heart disease and other diseases of the circulatory system: Secondary | ICD-10-CM | POA: Insufficient documentation

## 2011-11-07 DIAGNOSIS — I10 Essential (primary) hypertension: Secondary | ICD-10-CM | POA: Insufficient documentation

## 2011-11-07 DIAGNOSIS — Z951 Presence of aortocoronary bypass graft: Secondary | ICD-10-CM | POA: Insufficient documentation

## 2011-11-07 DIAGNOSIS — E785 Hyperlipidemia, unspecified: Secondary | ICD-10-CM

## 2011-11-07 DIAGNOSIS — E119 Type 2 diabetes mellitus without complications: Secondary | ICD-10-CM | POA: Insufficient documentation

## 2011-11-07 DIAGNOSIS — Z87891 Personal history of nicotine dependence: Secondary | ICD-10-CM | POA: Insufficient documentation

## 2011-11-07 MED ORDER — TECHNETIUM TC 99M TETROFOSMIN IV KIT
11.0000 | PACK | Freq: Once | INTRAVENOUS | Status: AC | PRN
  Administered 2011-11-07: 11 via INTRAVENOUS

## 2011-11-07 MED ORDER — REGADENOSON 0.4 MG/5ML IV SOLN
0.4000 mg | Freq: Once | INTRAVENOUS | Status: AC
  Administered 2011-11-07: 0.4 mg via INTRAVENOUS

## 2011-11-07 MED ORDER — TECHNETIUM TC 99M TETROFOSMIN IV KIT
33.0000 | PACK | Freq: Once | INTRAVENOUS | Status: AC | PRN
  Administered 2011-11-07: 33 via INTRAVENOUS

## 2011-11-07 NOTE — Progress Notes (Signed)
[facility] SITE 3 NUCLEAR MED [address] [place] [address] [phone]  Cardiology Nuclear Med Study  Alexander Choi is a 78 y.o. male     MRN : [id number]     DOB: [birth year]  Procedure Date: 11/07/2011  Nuclear Med Background Indication for Stress Test:  Evaluation for Ischemia and Graft Patency History:  2006 Heart Cath: severe 3v Dz-CABG-EF:60%, 2008 MPS: NL EF: 61% Cardiac Risk Factors: Family History - CAD, History of Smoking, Hypertension, Lipids and NIDDM  Symptoms:  n/a   Nuclear Pre-Procedure Caffeine/Decaff Intake:  None> 12 hrs NPO After: 9:00pm   Lungs:  clear O2 Sat: 95% on room air. IV 0.9% NS with Angio Cath:  20g  IV Site: L Hand x 1, tolerated well IV Started by:  Irean Hong, RN  Chest Size (in):  46 Cup Size: n/a  Height: 5\' 10"  (1.778 m)  Weight:  202 lb (91.627 kg)  BMI:  Body mass index is 28.98 kg/(m^2). Tech Comments:  Toprol held 24 hrs per patient,no diabetic medication today    Nuclear Med Study 1 or 2 day study: 1 day  Stress Test Type:  Treadmill/Lexiscan  Reading MD: Olga Millers, MD  Order Authorizing Provider:  Peter Swaziland, MD  Resting Radionuclide: Technetium 28m Tetrofosmin  Resting Radionuclide Dose: 11.0 mCi   Stress Radionuclide:  Technetium 22m Tetrofosmin  Stress Radionuclide Dose: 33.0 mCi           Stress Protocol Rest HR: 55 Stress HR: 80  Rest BP: 151/65 Stress BP: 173/64  Exercise Time (min): n/a METS: n/a   Predicted Max HR: 144 bpm % Max HR: 55.56 bpm Rate Pressure Product: 21308   Dose of Adenosine (mg):  n/a Dose of Lexiscan: 0.4 mg  Dose of Atropine (mg): n/a Dose of Dobutamine: n/a mcg/kg/min (at max HR)  Stress Test Technologist: Milana Na, EMT-P  Nuclear Technologist:  Domenic Polite, CNMT     Rest Procedure:  Myocardial perfusion imaging was performed at rest 45 minutes following the intravenous administration of Technetium 51m Tetrofosmin. Rest ECG: NSR - Normal  EKG  Stress Procedure:  The patient received IV Lexiscan 0.4 mg over 15-seconds with concurrent low level exercise and then Technetium 18m Tetrofosmin was injected at 30-seconds while the patient continued walking one more minute. There were no significant changes, + sob, and nausea with Lexiscan. Quantitative spect images were obtained after a 45-minute delay. Stress ECG: No significant change from baseline ECG  QPS Raw Data Images:  Acquisition technically good; normal left ventricular size. Stress Images:  There is decreased uptake in the inferior wall. Rest Images:  There is decreased uptake in the inferior wall. Subtraction (SDS):  No evidence of ischemia. Transient Ischemic Dilatation (Normal <1.22):  1.03 Lung/Heart Ratio (Normal <0.45):  0.29  Quantitative Gated Spect Images QGS EDV:  89 ml QGS ESV:  33 ml  Impression Exercise Capacity:  Lexiscan with no exercise. BP Response:  Normal blood pressure response. Clinical Symptoms:  There is dyspnea. ECG Impression:  No significant ST segment change suggestive of ischemia. Comparison with Prior Nuclear Study: No significant change from previous study  Overall Impression:  Normal stress nuclear study with a small, mild, fixed inferobasal defect consistent with thinning; no ischemia.  LV Ejection Fraction: 62%.  LV Wall Motion:  NL LV Function; NL Wall Motion  Olga Millers

## 2012-05-04 ENCOUNTER — Encounter: Payer: Self-pay | Admitting: Cardiology

## 2012-05-04 ENCOUNTER — Ambulatory Visit (INDEPENDENT_AMBULATORY_CARE_PROVIDER_SITE_OTHER): Payer: Medicare Other | Admitting: Cardiology

## 2012-05-04 VITALS — BP 120/70 | HR 51 | Ht 70.0 in | Wt 193.0 lb

## 2012-05-04 DIAGNOSIS — E7800 Pure hypercholesterolemia, unspecified: Secondary | ICD-10-CM

## 2012-05-04 DIAGNOSIS — I2510 Atherosclerotic heart disease of native coronary artery without angina pectoris: Secondary | ICD-10-CM

## 2012-05-04 DIAGNOSIS — I10 Essential (primary) hypertension: Secondary | ICD-10-CM

## 2012-05-04 NOTE — Patient Instructions (Signed)
Continue your current therapy.  I will see you back in 6 months.   

## 2012-05-04 NOTE — Progress Notes (Signed)
Alexander Choi Date of Birth: 1933/01/12   History of Present Illness: [name] is seen for followup today. He has a history of coronary disease and is status post coronary bypass surgery in 2006. This included an LIMA graft to LAD, saphenous vein graft sequentially to the mid and distal obtuse marginal vessel, and saphenous vein graft to the acute marginal branch of the right coronary. He had a normal Myoview study in may of 2013. He continues to feel very well. He denies any chest pain or shortness of breath. He is now unrolled and are trial for a new CTEP  Inhibitor. He states he hasn't noticed any change.  Current Outpatient Prescriptions on File Prior to Visit  Medication Sig Dispense Refill  . aspirin 81 MG tablet Take 81 mg by mouth daily.        Marland Kitchen atorvastatin (LIPITOR) 40 MG tablet Take 40 mg by mouth daily.      Marland Kitchen glyBURIDE (DIABETA) 5 MG tablet Take 5 mg by mouth 2 (two) times daily with a meal.       . metFORMIN (GLUCOPHAGE) 500 MG tablet Take 500 mg by mouth as directed.      . metoprolol succinate (TOPROL-XL) 25 MG 24 hr tablet Take 25 mg by mouth daily.        . valsartan-hydrochlorothiazide (DIOVAN-HCT) 160-12.5 MG per tablet Take 1 tablet by mouth daily.          Allergies  Allergen Reactions  . Ace Inhibitors   . Nitrates, Organic     Past Medical History  Diagnosis Date  . Coronary artery disease   . Diabetes mellitus     Type 2  . Hypertension   . Hypercholesterolemia   . Cancer   . Prostate cancer   . Loose stools   . Chronic kidney disease     kidney stones  . Hematuria   . Urinary obstruction     Past Surgical History  Procedure Date  . Coronary artery bypass graft 2006  . Other surgical history     bowel obstruction  . Radioactive seed implant     for prostate cancer  . Cholecystectomy   . Skin cancer excision     History  Smoking status  . Former Smoker -- 3.0 packs/day  Smokeless tobacco  . Not on file    History  Alcohol Use No     Family History  Problem Relation Age of Onset  . Cancer Mother     Leukemia  . Hypertension Mother   . Cancer Father     Colon  . Heart attack Father     Had Several in his 29's  . Hypertension Father   . Diabetes Father     Review of Systems: The review of systems is positive for cataract surgery. He has chronic arthralgias and thinks he may need to have knee surgery. All other systems were reviewed and are negative.  Physical Exam: BP 120/70  Pulse 51  Ht 5\' 10"  (1.778 m)  Wt 193 lb (87.544 kg)  BMI 27.69 kg/m2  SpO2 95% The patient is alert and oriented x 3.  The skin is warm and dry.  Color is normal.  The HEENT is normal. The carotids are 2+ without bruits.  There is no thyromegaly.  There is no JVD.  The lungs are clear.  The chest wall is non tender.  The heart exam reveals a regular rate with a normal S1 and S2.  There  are no murmurs, gallops, or rubs.  The PMI is not displaced.   Abdominal exam reveals good bowel sounds.    There are no masses.  Exam of the legs reveal no clubbing, cyanosis, or edema.  The legs are without rashes.  The distal pulses are intact.  Cranial nerves II - XII are intact.  Motor and sensory functions are intact.  The gait is normal.  LABORATORY DATA:   Assessment / Plan: 1. Coronary disease status post CABG in 2006. Normal Myoview study are rare this year. He is asymptomatic. We will continue with his medical therapy. I'll see him back again in 6 months.  2. Hypertension well controlled.  3. Hypercholesterolemia. Currently in a new drug trial. We will follow.

## 2012-11-20 ENCOUNTER — Ambulatory Visit: Payer: Medicare Other | Admitting: Cardiology

## 2012-11-23 ENCOUNTER — Ambulatory Visit: Payer: Medicare Other | Admitting: Nurse Practitioner

## 2012-11-23 ENCOUNTER — Encounter: Payer: Self-pay | Admitting: Cardiology

## 2012-11-26 ENCOUNTER — Encounter (INDEPENDENT_AMBULATORY_CARE_PROVIDER_SITE_OTHER): Payer: 59

## 2012-11-26 DIAGNOSIS — R0989 Other specified symptoms and signs involving the circulatory and respiratory systems: Secondary | ICD-10-CM

## 2012-11-28 ENCOUNTER — Ambulatory Visit (INDEPENDENT_AMBULATORY_CARE_PROVIDER_SITE_OTHER): Payer: 59 | Admitting: Nurse Practitioner

## 2012-11-28 ENCOUNTER — Encounter: Payer: Self-pay | Admitting: Nurse Practitioner

## 2012-11-28 VITALS — BP 120/56 | HR 50 | Ht 70.0 in | Wt 186.1 lb

## 2012-11-28 DIAGNOSIS — I2510 Atherosclerotic heart disease of native coronary artery without angina pectoris: Secondary | ICD-10-CM

## 2012-11-28 NOTE — Progress Notes (Signed)
Alexander Choi Date of Birth: 22-Feb-1933 Medical Record #[id number]  History of Present Illness: Alexander Choi is seen back today for a 6 month check. Seen for Dr. Swaziland. He has known CAD with prior CABG in 2006, negative Myoview in May of 2013. Other issues include DM, HTN and HLD.   Last seen here in November and was doing ok.   Comes back today. He is here alone. Doing ok. No real complaint. Has been working on his weight. Sugars are lower and he has had his Glyburide cut back. No chest pain. Not short of breath. Remains active and exercises daily. Tolerating his medicines. He is in a study on his lipids and has his labs checked thru that and otherwise thru Dr. Tomasa Blase.   Current Outpatient Prescriptions on File Prior to Visit  Medication Sig Dispense Refill  . aspirin 81 MG tablet Take 81 mg by mouth daily.        Marland Kitchen atorvastatin (LIPITOR) 40 MG tablet Take 40 mg by mouth daily.      Marland Kitchen glyBURIDE (DIABETA) 5 MG tablet Take 2.5 mg by mouth 2 (two) times daily with a meal.       . metFORMIN (GLUCOPHAGE) 500 MG tablet Take 500 mg by mouth as directed.      . metoprolol succinate (TOPROL-XL) 25 MG 24 hr tablet Take 25 mg by mouth daily.        . valsartan-hydrochlorothiazide (DIOVAN-HCT) 160-12.5 MG per tablet Take 1 tablet by mouth daily.         No current facility-administered medications on file prior to visit.    Allergies  Allergen Reactions  . Ace Inhibitors   . Nitrates, Organic     Past Medical History  Diagnosis Date  . Coronary artery disease     with CABG in 2006 with LIMA to LAD, SVG to mid and distal OM, SVG to AM. Normal Myoview in May of 2013  . Diabetes mellitus     Type 2  . Hypertension   . Hypercholesterolemia   . Cancer   . Prostate cancer   . Loose stools   . Chronic kidney disease     kidney stones  . Hematuria   . Urinary obstruction     Past Surgical History  Procedure Laterality Date  . Coronary artery bypass graft  2006  . Other surgical  history      bowel obstruction  . Radioactive seed implant      for prostate cancer  . Cholecystectomy    . Skin cancer excision      History  Smoking status  . Former Smoker -- 3.00 packs/day  Smokeless tobacco  . Not on file    History  Alcohol Use No    Family History  Problem Relation Age of Onset  . Cancer Mother     Leukemia  . Hypertension Mother   . Cancer Father     Colon  . Heart attack Father     Had Several in his 38's  . Hypertension Father   . Diabetes Father     Review of Systems: The review of systems is per the HPI.  All other systems were reviewed and are negative.  Physical Exam: BP 120/56  Pulse 50  Ht 5\' 10"  (1.778 m)  Wt 186 lb 1.9 oz (84.423 kg)  BMI 26.71 kg/m2 Patient is very pleasant and in no acute distress. Skin is warm and dry. Color is normal.  HEENT is unremarkable. Normocephalic/atraumatic.  PERRL. Sclera are nonicteric. Neck is supple. No masses. No JVD. Lungs are clear. Cardiac exam shows a regular rate and rhythm. Abdomen is soft. Extremities are without edema. Gait and ROM are intact. No gross neurologic deficits noted.  LABORATORY DATA:  No results found for this basename: WBC,  HGB,  HCT,  PLT,  GLUCOSE,  CHOL,  TRIG,  HDL,  LDLDIRECT,  LDLCALC,  ALT,  AST,  NA,  K,  CL,  CREATININE,  BUN,  CO2,  TSH,  PSA,  INR,  GLUF,  HGBA1C,  MICROALBUR    Assessment / Plan: 1. CAD - prior CABG - negative Myoview last May of 2013 - doing very well. Continue with CV risk factor modification.   2. HTN - blood pressure looks good.   3.HLD - on statin therapy.   Will see him back in 6 months. No change in his medicines.   Patient is agreeable to this plan and will call if any problems develop in the interim.   Rosalio Macadamia, RN, ANP-C [facility] [address] [place], [place]  [address]

## 2012-11-28 NOTE — Patient Instructions (Addendum)
I think you are doing well  Stay active  Dr. Swaziland will see you in 6 months  Stay on your current medicines  Call the [facility] office at [phone] if you have any questions, problems or concerns.

## 2013-02-08 ENCOUNTER — Encounter: Payer: Self-pay | Admitting: Cardiovascular Disease

## 2013-07-17 ENCOUNTER — Encounter: Payer: Self-pay | Admitting: Cardiology

## 2013-07-17 ENCOUNTER — Ambulatory Visit (INDEPENDENT_AMBULATORY_CARE_PROVIDER_SITE_OTHER): Payer: 59 | Admitting: Cardiology

## 2013-07-17 VITALS — BP 132/68 | HR 57 | Ht 70.0 in | Wt 183.8 lb

## 2013-07-17 DIAGNOSIS — I2510 Atherosclerotic heart disease of native coronary artery without angina pectoris: Secondary | ICD-10-CM

## 2013-07-17 DIAGNOSIS — E7800 Pure hypercholesterolemia, unspecified: Secondary | ICD-10-CM

## 2013-07-17 DIAGNOSIS — I10 Essential (primary) hypertension: Secondary | ICD-10-CM

## 2013-07-17 NOTE — Patient Instructions (Signed)
Continue your current therapy  I will see you in 6 months.   

## 2013-07-17 NOTE — Progress Notes (Signed)
Alexander Choi Date of Birth: 06-08-1933 Medical Record #[id number]  History of Present Illness: Alexander Choi is seen back today for a 6 month check. He has known CAD with prior CABG in 2006, negative Myoview in May of 2013. Other issues include DM, HTN and HLD.  On follow up today he states he is doing well.  No real complaint. Has been working on his weight.  No chest pain. Not short of breath. Remains active and exercises daily. Tolerating his medicines. He is in a study on his lipids. Labs in November reviewed but lipids not included since this is monitored by the study.  Current Outpatient Prescriptions on File Prior to Visit  Medication Sig Dispense Refill  . aspirin 81 MG tablet Take 81 mg by mouth daily.        Marland Kitchen atorvastatin (LIPITOR) 40 MG tablet Take 40 mg by mouth daily.      . metFORMIN (GLUCOPHAGE) 500 MG tablet Take 500 mg by mouth 2 (two) times daily with a meal.       . metoprolol succinate (TOPROL-XL) 25 MG 24 hr tablet Take 25 mg by mouth daily.        . valsartan-hydrochlorothiazide (DIOVAN-HCT) 160-12.5 MG per tablet Take 1 tablet by mouth daily.         No current facility-administered medications on file prior to visit.    Allergies  Allergen Reactions  . Ace Inhibitors   . Nitrates, Organic     Past Medical History  Diagnosis Date  . Coronary artery disease     with CABG in 2006 with LIMA to LAD, SVG to mid and distal OM, SVG to AM. Normal Myoview in May of 2013  . Diabetes mellitus     Type 2  . Hypertension   . Hypercholesterolemia   . Cancer   . Prostate cancer   . Loose stools   . Chronic kidney disease     kidney stones  . Hematuria   . Urinary obstruction   . GERD (gastroesophageal reflux disease)     Past Surgical History  Procedure Laterality Date  . Coronary artery bypass graft  2006  . Other surgical history      bowel obstruction  . Radioactive seed implant      for prostate cancer  . Cholecystectomy    . Skin cancer excision       History  Smoking status  . Former Smoker -- 3.00 packs/day  Smokeless tobacco  . Not on file    History  Alcohol Use No    Family History  Problem Relation Age of Onset  . Cancer Mother     Leukemia  . Hypertension Mother   . Cancer Father     Colon  . Heart attack Father     Had Several in his 36's  . Hypertension Father   . Diabetes Father     Review of Systems: The review of systems is per the HPI.  All other systems were reviewed and are negative.  Physical Exam: BP 132/68  Pulse 57  Ht 5\' 10"  (1.778 m)  Wt 183 lb 12.8 oz (83.371 kg)  BMI 26.37 kg/m2 Patient is very pleasant and in no acute distress. Skin is warm and dry. Color is normal.  HEENT is unremarkable. Normocephalic/atraumatic. PERRL. Sclera are nonicteric. Neck is supple. No masses. No JVD. Lungs are clear. Cardiac exam shows a regular rate and rhythm. Abdomen is soft. Extremities are without edema. Gait and ROM are  intact. No gross neurologic deficits noted.  LABORATORY DATA: Ecg: sinus brady rate 57 bpm. Normal.   Assessment / Plan: 1. CAD - prior CABG - negative Myoview last May of 2013 - doing very well. Continue with CV risk factor modification.   2. HTN - blood pressure looks good.   3.HLD - on statin therapy.   4. DM type II controlled.

## 2013-09-06 ENCOUNTER — Encounter: Payer: Self-pay | Admitting: Cardiology

## 2013-11-19 ENCOUNTER — Ambulatory Visit (INDEPENDENT_AMBULATORY_CARE_PROVIDER_SITE_OTHER): Payer: Medicare PPO | Admitting: Podiatrist

## 2013-11-19 ENCOUNTER — Encounter: Payer: Self-pay | Admitting: Podiatrist

## 2013-11-19 ENCOUNTER — Ambulatory Visit (INDEPENDENT_AMBULATORY_CARE_PROVIDER_SITE_OTHER): Payer: Medicare PPO

## 2013-11-19 VITALS — BP 131/64 | HR 59 | Resp 18

## 2013-11-19 DIAGNOSIS — M7750 Other enthesopathy of unspecified foot: Secondary | ICD-10-CM

## 2013-11-19 DIAGNOSIS — R52 Pain, unspecified: Secondary | ICD-10-CM

## 2013-11-19 DIAGNOSIS — M792 Neuralgia and neuritis, unspecified: Secondary | ICD-10-CM

## 2013-11-19 DIAGNOSIS — IMO0002 Reserved for concepts with insufficient information to code with codable children: Secondary | ICD-10-CM

## 2013-11-19 MED ORDER — MELOXICAM 15 MG PO TABS: 15.0000 mg | ORAL_TABLET | Freq: Every day | ORAL | Status: DC

## 2013-11-19 MED ORDER — TRIAMCINOLONE ACETONIDE 10 MG/ML IJ SUSP
10.0000 mg | Freq: Once | INTRAMUSCULAR | Status: AC
  Administered 2013-11-19: 10 mg

## 2013-11-19 MED ORDER — METHYLPREDNISOLONE (PAK) 4 MG PO TABS: ORAL_TABLET | ORAL | Status: DC

## 2013-11-19 NOTE — Patient Instructions (Addendum)
Diabetic Neuropathy Diabetic neuropathy is a nerve disease or nerve damage that is caused by diabetes mellitus. About half of all people with diabetes mellitus have some form of nerve damage. Nerve damage is more common in those who have had diabetes mellitus for many years and who generally have not had good control of their blood sugar (glucose) level. Diabetic neuropathy is a common complication of diabetes mellitus. There are three more common types of diabetic neuropathy and a fourth type that is less common and less understood:   Peripheral neuropathy This is the most common type of diabetic neuropathy. It causes damage to the nerves of the feet and legs first and then eventually the hands and arms.The damage affects the ability to sense touch.  Autonomic neuropathy This type causes damage to the autonomic nervous system, which controls the following functions:  Heartbeat.  Body temperature.  Blood pressure.  Urination.  Digestion.  Sweating.  Sexual function.  Focal neuropathy Focal neuropathy can be painful and unpredictable and occurs most often in older adults with diabetes mellitus. It involves a specific nerve or one area and often comes on suddenly. It usually does not cause long-term problems.  Radiculoplexus neuropathy Sometimes called lumbosacral radiculoplexus neuropathy, radiculoplexus neuropathy affects the nerves of the thighs, hips, buttocks, or legs. It is more common in people with type 2 diabetes mellitus and in older men. It is characterized by debilitating pain, weakness, and atrophy, usually in the thigh muscles. CAUSES  The cause of peripheral, autonomic, and focal neuropathies is diabetes mellitus that is uncontrolled and high glucose levels. The cause of radiculoplexus neuropathy is unknown. However, it is thought to be caused by inflammation related to uncontrolled glucose levels. SIGNS AND SYMPTOMS  Peripheral Neuropathy Peripheral neuropathy develops  slowly over time. When the nerves of the feet and legs no longer work there may be:   Burning, stabbing, or aching pain in the legs or feet.  Inability to feel pressure or pain in your feet. This can lead to:  Thick calluses over pressure areas.  Pressure sores.  Ulcers.  Foot deformities.  Reduced ability to feel temperature changes.  Muscle weakness. Autonomic Neuropathy The symptoms of autonomic neuropathy vary depending on which nerves are affected. Symptoms may include:  Problems with digestion, such as:  Feeling sick to your stomach (nausea).  Vomiting.  Bloating.  Constipation.  Diarrhea.  Abdominal pain.  Difficulty with urination. This occurs if you lose your ability to sense when your bladder is full. Problems include:  Urine leakage (incontinence).  Inability to empty your bladder completely (retention).  Rapid or irregular heartbeat (palpitations).  Blood pressure drops when you stand up (orthostatic hypotension). When you stand up you may feel:  Dizzy.  Weak.  Faint.  In men, inability to attain and maintain an erection.  In women, vaginal dryness and problems with decreased sexual desire and arousal.  Problems with body temperature regulation.  Increased or decreased sweating. Focal Neuropathy  Abnormal eye movements or abnormal alignment of both eyes.  Weakness in the wrist.  Foot drop. This results in an inability to lift the foot properly and abnormal walking or foot movement.  Paralysis on one side of your face (Bell palsy).  Chest or abdominal pain. Radiculoplexus Neuropathy  Sudden, severe pain in your hip, thigh, or buttocks.  Weakness and wasting of thigh muscles.  Difficulty rising from a seated position.  Abdominal swelling.  Unexplained weight loss (usually more than 10 lb [4.5 kg]). DIAGNOSIS  Peripheral Neuropathy   Your senses may be tested. Sensory function testing can be done with:  A light touch using a  monofilament.  A vibration with tuning fork.  A sharp sensation with a pin prick. Other tests that can help diagnose neuropathy are:  Nerve conduction velocity. This test checks the transmission of an electrical current through a nerve.  Electromyography. This shows how muscles respond to electrical signals transmitted by nearby nerves.  Quantitative sensory testing. This is used to assess how your nerves respond to vibrations and changes in temperature. Autonomic Neuropathy Diagnosis is often based on reported symptoms. Tell your health care provider if you experience:   Dizziness.   Constipation.   Diarrhea.   Inappropriate urination or inability to urinate.   Inability to get or maintain an erection.  Tests that may be done include:   Electrocardiography or Holter monitor. These are tests that can help show problems with the heart rate or heart rhythm.   An X-ray exam may be done. Focal Neuropathy Diagnosis is made based on your symptoms and what your health care provider finds during your exam. Other tests may be done. They may include:  Nerve conduction velocities. This checks the transmission of electrical current through a nerve.  Electromyography. This shows how muscles respond to electrical signals transmitted by nearby nerves.  Quantitative sensory testing. This test is used to assess how your nerves respond to vibration and changes in temperature. Radiculoplexus Neuropathy  Often the first thing is to eliminate any other issue or problems that might be the cause, as there is no stick test for diagnosis.  X-ray exam of your spine and lumbar region.  Spinal tap to rule out cancer.  MRI to rule out other lesions. TREATMENT  Once nerve damage occurs, it cannot be reversed. The goal of treatment is to keep the disease or nerve damage from getting worse and affecting more nerve fibers. Controlling your blood glucose level is the key. Most people with  radiculoplexus neuropathy see at least a partial improvement over time. You will need to keep your blood glucose and HbA1c levels in the target range determined by your health care provider. Things that help control blood glucose levels include:   Blood glucose monitoring.   Meal planning.   Physical activity.   Diabetes medicine.  Over time, maintaining lower blood glucose levels helps lessen symptoms. Sometimes, prescription pain medicine is needed. HOME CARE INSTRUCTIONS:  Do not smoke.  Keep your blood glucose level in the range that you and your health care provider have determined acceptable for you.  Keep your blood pressure level in the range that you and your health care provider have determined acceptable for you.  Eat a well-balanced diet.  Be active every day.  Check your feet every day. SEEK MEDICAL CARE IF:   You have burning, stabbing, or aching pain in the legs or feet.  You are unable to feel pressure or pain in your feet.  You develop problems with digestion such as:  Nausea.  Vomiting.  Bloating.  Constipation.  Diarrhea.  Abdominal pain.  You have difficulty with urination, such as:  Incontinence.  Retention.  You have palpitations.  You develop orthostatic hypotension. When you stand up you may feel:  Dizzy.  Weak.  Faint.  You cannot attain and maintain an erection (in men).  You have vaginal dryness and problems with decreased sexual desire and arousal (in women).  You have severe pain in your thighs, legs, or buttocks.  You have   unexplained weight loss. Document Released: 08/22/2001 Document Revised: 04/03/2013 Document Reviewed: 11/22/2012 [employer] Patient Information 2014 [employer].  

## 2013-11-19 NOTE — Progress Notes (Signed)
   Subjective:    Patient ID: Alexander Choi, male    DOB: [birth year], 80 y.o.   MRN: [id number]  HPI my right foot has some nerve pain on top and has been going on for about 2 weeks ago and burns and throbs and no numbness or tingling and I took ibuprofen before lunch today    Review of Systems  Genitourinary: Positive for frequency.  Hematological: Bruises/bleeds easily.  All other systems reviewed and are negative.      Objective:   Physical Exam GENERAL APPEARANCE: Alert, conversant. Appropriately groomed. No acute distress.  VASCULAR: Pedal pulses palpable at 1/4 DP and PT bilateral.  Capillary refill time is immediate to all digits,  Proximal to distal cooling it warm to warm.   NEUROLOGIC: sensation is decreased via 5.07 monofilament at 0/5 sites bilateral.  Light touch is decreased bilateral, vibratory sensation absent bilateral, achilles tendon reflex is absent bilateral. Nerve type discomfort is present along the dorsal aspect of the foot at the deep peroneal nerve.  Subjective complaints of shocking pain reported at times.  MUSCULOSKELETAL: pes planus deformity of the foot is noted.  The foot is abducted at the mid tarsal joint region.  Patient relates past pain at the 5th metatarsal base but no current pain is reported.  No pain in the sinus tarsi region noted.  DERMATOLOGIC: skin color, texture, and turger are within normal limits.  No preulcerative lesions are seen, no interdigital maceration noted.  No open lesions present.  Digital nails are asymptomatic.    Nerve pain top of right foot at superficial peroneal nerve.  Pain on lateral 5th met base is better.  Neuropathy  Abducted foot type.  pred kit, nsaids, shot      Assessment & Plan:  Neuritis, pes planus deformity, diabetes with neuropathy, arthritis  Plan:  Recommended an injection near the nerve to try and decrease the discomfort-- otherwise discussed oral gabapentin as the treatment option.  Patient agreed on an  injection and this was carried out with 2.5 mg kenalog and marcaine plain near the deep peroneal nerve anterior to the ankle joint.  Discussed his foot and his trauma he sustained as a young man with what sounds like a lisfranc's dislocation.  Most likely arthritis is causing his pain.  Recommended a medrol dose pack if the pain returns and mobic.  He will call if he has continued discomfort.

## 2013-11-20 ENCOUNTER — Encounter: Payer: Self-pay | Admitting: Cardiology

## 2013-11-22 ENCOUNTER — Encounter: Payer: Self-pay | Admitting: Cardiology

## 2013-12-04 ENCOUNTER — Telehealth: Payer: Self-pay | Admitting: *Deleted

## 2013-12-04 NOTE — Telephone Encounter (Signed)
Spoke with patient and called the prescriptions into [facility]. Alexander Choi

## 2013-12-04 NOTE — Telephone Encounter (Signed)
I was in to see her on 05/26.  She gave me a shot for Neuropathy.  She said if that didn't work to give her a call and she would prescribe a medication.  I need to get that medication.

## 2013-12-04 NOTE — Telephone Encounter (Signed)
Can  You call in a medrol dose pack and mobic-=- and see if he has a follow up-- i'd like to see him in 2-3 weeks since his pain is still there.  Thanks!

## 2014-03-27 ENCOUNTER — Encounter: Payer: Self-pay | Admitting: Cardiology

## 2014-03-27 ENCOUNTER — Ambulatory Visit (INDEPENDENT_AMBULATORY_CARE_PROVIDER_SITE_OTHER): Payer: 59 | Admitting: Cardiology

## 2014-03-27 VITALS — BP 122/66 | HR 64 | Ht 70.0 in | Wt 192.0 lb

## 2014-03-27 DIAGNOSIS — E7800 Pure hypercholesterolemia, unspecified: Secondary | ICD-10-CM

## 2014-03-27 DIAGNOSIS — I10 Essential (primary) hypertension: Secondary | ICD-10-CM

## 2014-03-27 DIAGNOSIS — I25119 Atherosclerotic heart disease of native coronary artery with unspecified angina pectoris: Secondary | ICD-10-CM

## 2014-03-27 NOTE — Patient Instructions (Signed)
We will schedule you for a nuclear stress test.   Continue your current therapy  I will see you in one year.  

## 2014-03-27 NOTE — Progress Notes (Signed)
[name] Date of Birth: 09-18-1932 Medical Record #[id number]  History of Present Illness: Mr. Berendt is seen back today for a follow up visit. He has known CAD with prior CABG in 2006, negative Myoview in May of 2013. Other issues include DM, HTN and HLD.  On follow up today he states he is doing well.  He has been under a great deal of stress this past summer due to his wife's psychiatric problems with severe anxiety. She is now in an assisted living arrangement and his stress is better. He has experienced severe reflux symptoms especially at night following supper. He is on Zantac and Prilosec with some improvement. No exertional chest pain. He reports some problems with bladder control evaluated by urology but this has improved. He has gained 9 lbs and his glipizide dose has been increased. His lipids are monitored by a lipid study he is participating in.   Current Outpatient Prescriptions on File Prior to Visit  Medication Sig Dispense Refill  . aspirin 81 MG tablet Take 81 mg by mouth daily.        Marland Kitchen atorvastatin (LIPITOR) 40 MG tablet Take 40 mg by mouth daily.      Marland Kitchen FREESTYLE LITE test strip As directed      . metFORMIN (GLUCOPHAGE) 500 MG tablet Take 500 mg by mouth 2 (two) times daily with a meal.       . metoprolol succinate (TOPROL-XL) 25 MG 24 hr tablet Take 25 mg by mouth daily.        . valsartan-hydrochlorothiazide (DIOVAN-HCT) 160-12.5 MG per tablet Take 1 tablet by mouth daily.         No current facility-administered medications on file prior to visit.    Allergies  Allergen Reactions  . Ace Inhibitors   . Nitrates, Organic     Past Medical History  Diagnosis Date  . Coronary artery disease     with CABG in 2006 with LIMA to LAD, SVG to mid and distal OM, SVG to AM. Normal Myoview in May of 2013  . Diabetes mellitus     Type 2  . Hypertension   . Hypercholesterolemia   . Cancer   . Prostate cancer   . Loose stools   . Chronic kidney disease     kidney  stones  . Hematuria   . Urinary obstruction   . GERD (gastroesophageal reflux disease)     Past Surgical History  Procedure Laterality Date  . Coronary artery bypass graft  2006  . Other surgical history      bowel obstruction  . Radioactive seed implant      for prostate cancer  . Cholecystectomy    . Skin cancer excision      History  Smoking status  . Former Smoker -- 3.00 packs/day  Smokeless tobacco  . Not on file    History  Alcohol Use No    Family History  Problem Relation Age of Onset  . Cancer Mother     Leukemia  . Hypertension Mother   . Cancer Father     Colon  . Heart attack Father     Had Several in his 53's  . Hypertension Father   . Diabetes Father     Review of Systems: The review of systems is per the HPI.  All other systems were reviewed and are negative.  Physical Exam: BP 122/66  Pulse 64  Ht 5\' 10"  (1.778 m)  Wt 192 lb (87.091 kg)  BMI 27.55 kg/m2 Patient is very pleasant and in no acute distress. Skin is warm and dry. Color is normal.  HEENT is unremarkable. Normocephalic/atraumatic. PERRL. Sclera are nonicteric. Neck is supple. No masses. No JVD. Lungs are clear. Cardiac exam shows a regular rate and rhythm. Normal S1-2. No gallop or murmur. Abdomen is soft. Extremities are without edema. Gait and ROM are intact. No gross neurologic deficits noted.  LABORATORY DATA: Labs reviewed from 02/24/14 and scanned into Epic. Normal chemistries and CBC. A1c of 7.0%.    Assessment / Plan: 1. CAD - prior CABG in 2006 - negative Myoview in May of 2013. With his significant reflux symptoms I want to make sure he is not ischemic. Will schedule for [employer].  Continue with CV risk factor modification.   2. HTN - blood pressure looks good.   3.HLD - on statin therapy.   4. DM type II. Per primary care.

## 2014-04-02 ENCOUNTER — Telehealth (HOSPITAL_COMMUNITY): Payer: Self-pay

## 2014-04-02 NOTE — Telephone Encounter (Signed)
Encounter complete. 

## 2014-04-04 ENCOUNTER — Other Ambulatory Visit: Payer: Self-pay | Admitting: Cardiology

## 2014-04-04 ENCOUNTER — Ambulatory Visit (HOSPITAL_COMMUNITY)
Admission: RE | Admit: 2014-04-04 | Discharge: 2014-04-04 | Disposition: A | Discharge status: Home / Self Care | Payer: Medicare PPO | Source: Ambulatory Visit | Attending: Cardiology | Admitting: Cardiology

## 2014-04-04 DIAGNOSIS — E119 Type 2 diabetes mellitus without complications: Secondary | ICD-10-CM | POA: Insufficient documentation

## 2014-04-04 DIAGNOSIS — E669 Obesity, unspecified: Secondary | ICD-10-CM | POA: Insufficient documentation

## 2014-04-04 DIAGNOSIS — E7800 Pure hypercholesterolemia, unspecified: Secondary | ICD-10-CM

## 2014-04-04 DIAGNOSIS — I10 Essential (primary) hypertension: Secondary | ICD-10-CM

## 2014-04-04 DIAGNOSIS — Z87891 Personal history of nicotine dependence: Secondary | ICD-10-CM | POA: Insufficient documentation

## 2014-04-04 DIAGNOSIS — R9439 Abnormal result of other cardiovascular function study: Secondary | ICD-10-CM

## 2014-04-04 DIAGNOSIS — R5383 Other fatigue: Secondary | ICD-10-CM | POA: Insufficient documentation

## 2014-04-04 DIAGNOSIS — R0600 Dyspnea, unspecified: Secondary | ICD-10-CM | POA: Insufficient documentation

## 2014-04-04 DIAGNOSIS — I25119 Atherosclerotic heart disease of native coronary artery with unspecified angina pectoris: Secondary | ICD-10-CM

## 2014-04-04 DIAGNOSIS — E785 Hyperlipidemia, unspecified: Secondary | ICD-10-CM | POA: Insufficient documentation

## 2014-04-04 MED ORDER — TECHNETIUM TC 99M SESTAMIBI GENERIC - CARDIOLITE
30.9000 | Freq: Once | INTRAVENOUS | Status: AC | PRN
  Administered 2014-04-04: 30.9 via INTRAVENOUS

## 2014-04-04 MED ORDER — AMINOPHYLLINE 25 MG/ML IV SOLN
75.0000 mg | Freq: Once | INTRAVENOUS | Status: AC
  Administered 2014-04-04: 75 mg via INTRAVENOUS

## 2014-04-04 MED ORDER — REGADENOSON 0.4 MG/5ML IV SOLN
0.4000 mg | Freq: Once | INTRAVENOUS | Status: AC
  Administered 2014-04-04: 0.4 mg via INTRAVENOUS

## 2014-04-04 MED ORDER — TECHNETIUM TC 99M SESTAMIBI GENERIC - CARDIOLITE
10.6000 | Freq: Once | INTRAVENOUS | Status: AC | PRN
  Administered 2014-04-04: 11 via INTRAVENOUS

## 2014-04-04 NOTE — Procedures (Addendum)
[facility] [address] [place] [address] [phone]  Cardiology Nuclear Med Study  Alexander Choi is a 80 y.o. male     MRN : [id number]     DOB: 1932/07/20  Procedure Date: 04/04/2014  Nuclear Med Background Indication for Stress Test:  Graft Patency History:  CAD;CABG X3-2006;Last NUC MPI on 11/07/2011-nonischemic;EF=62% Cardiac Risk Factors: Family History - CAD, History of Smoking, Hypertension, Lipids, NIDDM and Overweight  Symptoms:  DOE and Fatigue   Nuclear Pre-Procedure Caffeine/Decaff Intake:  7:00pm NPO After: 5:00am   IV Site: R Forearm  IV 0.9% NS with Angio Cath:  22g  Chest Size (in):  42"  IV Started by: Rolene Course, RN  Height: 5\' 10"  (1.778 m)  Cup Size: n/a  BMI:  Body mass index is 27.55 kg/(m^2). Weight:  192 lb (87.091 kg)   Tech Comments:  n/a    Nuclear Med Study 1 or 2 day study: 1 day  Stress Test Type:  [place]  Order Authorizing Provider:  Peter [name], MD   Resting Radionuclide: Technetium 86m Sestamibi  Resting Radionuclide Dose: 10.6 mCi   Stress Radionuclide:  Technetium 73m Sestamibi  Stress Radionuclide Dose: 30.9 mCi           Stress Protocol Rest HR: 55 Stress HR: 67  Rest BP: 161/77 Stress BP: 146/67  Exercise Time (min): n/a METS: n/a   Predicted Max HR: 141 bpm % Max HR: 52.48 bpm Rate Pressure Product: 11988  Dose of Adenosine (mg):  n/a Dose of Lexiscan: 0.4 mg  Dose of Atropine (mg): n/a Dose of Dobutamine: n/a mcg/kg/min (at max HR)  Stress Test Technologist: Leane Para, CCT Nuclear Technologist: Imagene Riches, CNMT   Rest Procedure:  Myocardial perfusion imaging was performed at rest 45 minutes following the intravenous administration of Technetium 43m Sestamibi. Stress Procedure:  The patient received IV Lexiscan 0.4 mg over 15-seconds.  Technetium 10m Sestamibi injected at 30-seconds.  The patient experienced SOB and 75 mg Aminophylline IV was  administered.  There were no significant changes with Lexiscan.  Quantitative spect images were obtained after a 45 minute delay.  Transient Ischemic Dilatation (Normal <1.22):  1.07  QGS EDV:  94 ml QGS ESV:  40 ml LV Ejection Fraction: 58%       Rest ECG: NSR - Normal EKG  Stress ECG: No significant change from baseline ECG  QPS Raw Data Images:  Normal; no motion artifact; normal heart/lung ratio. Stress Images:  There is decreased uptake in the inferior wall. Rest Images:  Normal homogeneous uptake in all areas of the myocardium. Subtraction (SDS):  These findings are consistent with ischemia.  Impression Exercise Capacity:  Lexiscan with no exercise. BP Response:  Normal blood pressure response. Clinical Symptoms:  No significant symptoms noted. ECG Impression:  No significant ST segment change suggestive of ischemia. Comparison with Prior Nuclear Study: Inferior ischemic abnormality new. Pt will follow up with Dr. [name]  Overall Impression:  Low risk stress nuclear study Mild to moderate ischemic abnormality mid to distal inferior wall.  LV Wall Motion:  NL LV Function; NL Wall Motion   Lorretta Harp, MD  04/04/2014 11:25 AM

## 2014-04-08 ENCOUNTER — Other Ambulatory Visit: Payer: Self-pay

## 2014-04-08 ENCOUNTER — Ambulatory Visit
Admission: RE | Admit: 2014-04-08 | Discharge: 2014-04-08 | Disposition: A | Discharge status: Home / Self Care | Payer: Medicare PPO | Source: Ambulatory Visit | Attending: Cardiology | Admitting: Cardiology

## 2014-04-08 DIAGNOSIS — R9439 Abnormal result of other cardiovascular function study: Secondary | ICD-10-CM

## 2014-04-08 LAB — CBC WITH DIFFERENTIAL/PLATELET
BASOS ABS: 0.1 10*3/uL (ref 0.0–0.1)
BASOS PCT: 1 % (ref 0–1)
EOS PCT: 5 % (ref 0–5)
Eosinophils Absolute: 0.3 10*3/uL (ref 0.0–0.7)
HEMATOCRIT: 37.7 % — AB (ref 39.0–52.0)
Hemoglobin: 13 g/dL (ref 13.0–17.0)
LYMPHS PCT: 33 % (ref 12–46)
Lymphs Abs: 2 10*3/uL (ref 0.7–4.0)
MCH: 31.2 pg (ref 26.0–34.0)
MCHC: 34.5 g/dL (ref 30.0–36.0)
MCV: 90.4 fL (ref 78.0–100.0)
MONO ABS: 0.4 10*3/uL (ref 0.1–1.0)
Monocytes Relative: 7 % (ref 3–12)
Neutro Abs: 3.3 10*3/uL (ref 1.7–7.7)
Neutrophils Relative %: 54 % (ref 43–77)
Platelets: 215 10*3/uL (ref 150–400)
RBC: 4.17 MIL/uL — ABNORMAL LOW (ref 4.22–5.81)
RDW: 14.1 % (ref 11.5–15.5)
WBC: 6.1 10*3/uL (ref 4.0–10.5)

## 2014-04-08 LAB — BASIC METABOLIC PANEL
BUN: 13 mg/dL (ref 6–23)
CO2: 31 meq/L (ref 19–32)
Calcium: 9.4 mg/dL (ref 8.4–10.5)
Chloride: 98 mEq/L (ref 96–112)
Creat: 0.75 mg/dL (ref 0.50–1.35)
GLUCOSE: 142 mg/dL — AB (ref 70–99)
POTASSIUM: 4.1 meq/L (ref 3.5–5.3)
SODIUM: 138 meq/L (ref 135–145)

## 2014-04-08 LAB — PROTIME-INR
INR: 0.96 (ref <1.50)
Prothrombin Time: 12.8 seconds (ref 11.6–15.2)

## 2014-04-09 ENCOUNTER — Encounter (HOSPITAL_COMMUNITY): Payer: Self-pay | Admitting: Pharmacy Technician

## 2014-04-11 ENCOUNTER — Encounter (HOSPITAL_COMMUNITY)
Admission: RE | Disposition: A | Discharge status: Home / Self Care | Payer: Self-pay | Source: Ambulatory Visit | Attending: Cardiology

## 2014-04-11 ENCOUNTER — Ambulatory Visit (HOSPITAL_COMMUNITY)
Admission: RE | Admit: 2014-04-11 | Discharge: 2014-04-11 | Disposition: A | Discharge status: Home / Self Care | Payer: Medicare PPO | Source: Ambulatory Visit | Attending: Cardiology | Admitting: Cardiology

## 2014-04-11 DIAGNOSIS — Z7982 Long term (current) use of aspirin: Secondary | ICD-10-CM | POA: Diagnosis not present

## 2014-04-11 DIAGNOSIS — I10 Essential (primary) hypertension: Secondary | ICD-10-CM | POA: Diagnosis not present

## 2014-04-11 DIAGNOSIS — E119 Type 2 diabetes mellitus without complications: Secondary | ICD-10-CM | POA: Diagnosis not present

## 2014-04-11 DIAGNOSIS — Z87891 Personal history of nicotine dependence: Secondary | ICD-10-CM | POA: Diagnosis not present

## 2014-04-11 DIAGNOSIS — R9439 Abnormal result of other cardiovascular function study: Secondary | ICD-10-CM

## 2014-04-11 DIAGNOSIS — E785 Hyperlipidemia, unspecified: Secondary | ICD-10-CM | POA: Diagnosis not present

## 2014-04-11 DIAGNOSIS — Z951 Presence of aortocoronary bypass graft: Secondary | ICD-10-CM | POA: Diagnosis not present

## 2014-04-11 DIAGNOSIS — E118 Type 2 diabetes mellitus with unspecified complications: Secondary | ICD-10-CM | POA: Diagnosis not present

## 2014-04-11 DIAGNOSIS — I2510 Atherosclerotic heart disease of native coronary artery without angina pectoris: Secondary | ICD-10-CM | POA: Diagnosis not present

## 2014-04-11 DIAGNOSIS — K219 Gastro-esophageal reflux disease without esophagitis: Secondary | ICD-10-CM | POA: Insufficient documentation

## 2014-04-11 HISTORY — PX: LEFT HEART CATHETERIZATION WITH CORONARY/GRAFT ANGIOGRAM: SHX5450

## 2014-04-11 LAB — GLUCOSE, CAPILLARY: Glucose-Capillary: 131 mg/dL — ABNORMAL HIGH (ref 70–99)

## 2014-04-11 SURGERY — LEFT HEART CATHETERIZATION WITH CORONARY/GRAFT ANGIOGRAM
Anesthesia: LOCAL

## 2014-04-11 MED ORDER — SODIUM CHLORIDE 0.9 % IJ SOLN: 3.0000 mL | INTRAMUSCULAR | Status: DC | PRN

## 2014-04-11 MED ORDER — SODIUM CHLORIDE 0.9 % IV SOLN: 250.0000 mL | INTRAVENOUS | Status: DC | PRN

## 2014-04-11 MED ORDER — MIDAZOLAM HCL 2 MG/2ML IJ SOLN
INTRAMUSCULAR | Status: AC
  Filled 2014-04-11: qty 2

## 2014-04-11 MED ORDER — SODIUM CHLORIDE 0.9 % IV SOLN: 1.0000 mL/kg/h | INTRAVENOUS | Status: DC

## 2014-04-11 MED ORDER — SODIUM CHLORIDE 0.9 % IJ SOLN: 3.0000 mL | Freq: Two times a day (BID) | INTRAMUSCULAR | Status: DC

## 2014-04-11 MED ORDER — VERAPAMIL HCL 2.5 MG/ML IV SOLN
INTRAVENOUS | Status: AC
  Filled 2014-04-11: qty 2

## 2014-04-11 MED ORDER — SODIUM CHLORIDE 0.9 % IV SOLN
INTRAVENOUS | Status: DC
  Administered 2014-04-11: 1000 mL via INTRAVENOUS

## 2014-04-11 MED ORDER — FENTANYL CITRATE 0.05 MG/ML IJ SOLN
INTRAMUSCULAR | Status: AC
  Filled 2014-04-11: qty 2

## 2014-04-11 MED ORDER — LIDOCAINE HCL (PF) 1 % IJ SOLN
INTRAMUSCULAR | Status: AC
  Filled 2014-04-11: qty 30

## 2014-04-11 MED ORDER — ASPIRIN 81 MG PO CHEW
81.0000 mg | CHEWABLE_TABLET | ORAL | Status: AC
  Administered 2014-04-11: 81 mg via ORAL

## 2014-04-11 MED ORDER — HEPARIN SODIUM (PORCINE) 1000 UNIT/ML IJ SOLN
INTRAMUSCULAR | Status: AC
  Filled 2014-04-11: qty 1

## 2014-04-11 MED ORDER — ASPIRIN 81 MG PO CHEW
CHEWABLE_TABLET | ORAL | Status: AC
  Filled 2014-04-11: qty 1

## 2014-04-11 MED ORDER — METFORMIN HCL 500 MG PO TABS: 500.0000 mg | ORAL_TABLET | Freq: Two times a day (BID) | ORAL | Status: DC

## 2014-04-11 MED ORDER — HEPARIN (PORCINE) IN NACL 2-0.9 UNIT/ML-% IJ SOLN
INTRAMUSCULAR | Status: AC
  Filled 2014-04-11: qty 1500

## 2014-04-11 NOTE — Discharge Instructions (Signed)
Radial Site Care °Refer to this sheet in the next few weeks. These instructions provide you with information on caring for yourself after your procedure. Your caregiver may also give you more specific instructions. Your treatment has been planned according to current medical practices, but problems sometimes occur. Call your caregiver if you have any problems or questions after your procedure. °HOME CARE INSTRUCTIONS °· You may shower the day after the procedure. Remove the bandage (dressing) and gently wash the site with plain soap and water. Gently pat the site dry. °· Do not apply powder or lotion to the site. °· Do not submerge the affected site in water for 3 to 5 days. °· Inspect the site at least twice daily. °· Do not flex or bend the affected arm for 24 hours. °· No lifting over 5 pounds (2.3 kg) for 5 days after your procedure. °· Do not drive home if you are discharged the same day of the procedure. Have someone else drive you. °· You may drive 24 hours after the procedure unless otherwise instructed by your caregiver. °· Do not operate machinery or power tools for 24 hours. °· A responsible adult should be with you for the first 24 hours after you arrive home. °What to expect: °· Any bruising will usually fade within 1 to 2 weeks. °· Blood that collects in the tissue (hematoma) may be painful to the touch. It should usually decrease in size and tenderness within 1 to 2 weeks. °SEEK IMMEDIATE MEDICAL CARE IF: °· You have unusual pain at the radial site. °· You have redness, warmth, swelling, or pain at the radial site. °· You have drainage (other than a small amount of blood on the dressing). °· You have chills. °· You have a fever or persistent symptoms for more than 72 hours. °· You have a fever and your symptoms suddenly get worse. °· Your arm becomes pale, cool, tingly, or numb. °· You have heavy bleeding from the site. Hold pressure on the site. °Document Released: 07/16/2010 Document Revised:  09/05/2011 Document Reviewed: 07/16/2010 °ExitCare® Patient Information ©2015 [employer]. This information is not intended to replace advice given to you by your health care provider. Make sure you discuss any questions you have with your health care provider. ° °

## 2014-04-11 NOTE — Progress Notes (Signed)
TR BAND REMOVAL  LOCATION:    left radial  DEFLATED PER PROTOCOL:    Yes.    TIME BAND OFF / DRESSING APPLIED:    1315   SITE UPON ARRIVAL:    Level 0  SITE AFTER BAND REMOVAL:    Level 0  CIRCULATION SENSATION AND MOVEMENT:    Within Normal Limits   Yes.    COMMENTS:   TRB REMOVED/ TEGADERM DSG APPLIED

## 2014-04-11 NOTE — H&P (View-Only) (Signed)
Alexander Choi Date of Birth: 04/02/1933 Medical Record #[id number]  History of Present Illness: Alexander Choi is seen back today for a follow up visit. He has known CAD with prior CABG in 2006, negative Myoview in May of 2013. Other issues include DM, HTN and HLD.  On follow up today he states he is doing well.  He has been under a great deal of stress this past summer due to his wife's psychiatric problems with severe anxiety. She is now in an assisted living arrangement and his stress is better. He has experienced severe reflux symptoms especially at night following supper. He is on Zantac and Prilosec with some improvement. No exertional chest pain. He reports some problems with bladder control evaluated by urology but this has improved. He has gained 9 lbs and his glipizide dose has been increased. His lipids are monitored by a lipid study he is participating in.   Current Outpatient Prescriptions on File Prior to Visit  Medication Sig Dispense Refill  . aspirin 81 MG tablet Take 81 mg by mouth daily.        Marland Kitchen atorvastatin (LIPITOR) 40 MG tablet Take 40 mg by mouth daily.      Marland Kitchen FREESTYLE LITE test strip As directed      . metFORMIN (GLUCOPHAGE) 500 MG tablet Take 500 mg by mouth 2 (two) times daily with a meal.       . metoprolol succinate (TOPROL-XL) 25 MG 24 hr tablet Take 25 mg by mouth daily.        . valsartan-hydrochlorothiazide (DIOVAN-HCT) 160-12.5 MG per tablet Take 1 tablet by mouth daily.         No current facility-administered medications on file prior to visit.    Allergies  Allergen Reactions  . Ace Inhibitors   . Nitrates, Organic     Past Medical History  Diagnosis Date  . Coronary artery disease     with CABG in 2006 with LIMA to LAD, SVG to mid and distal OM, SVG to AM. Normal Myoview in May of 2013  . Diabetes mellitus     Type 2  . Hypertension   . Hypercholesterolemia   . Cancer   . Prostate cancer   . Loose stools   . Chronic kidney disease     kidney  stones  . Hematuria   . Urinary obstruction   . GERD (gastroesophageal reflux disease)     Past Surgical History  Procedure Laterality Date  . Coronary artery bypass graft  2006  . Other surgical history      bowel obstruction  . Radioactive seed implant      for prostate cancer  . Cholecystectomy    . Skin cancer excision      History  Smoking status  . Former Smoker -- 3.00 packs/day  Smokeless tobacco  . Not on file    History  Alcohol Use No    Family History  Problem Relation Age of Onset  . Cancer Mother     Leukemia  . Hypertension Mother   . Cancer Father     Colon  . Heart attack Father     Had Several in his 36's  . Hypertension Father   . Diabetes Father     Review of Systems: The review of systems is per the HPI.  All other systems were reviewed and are negative.  Physical Exam: BP 122/66  Pulse 64  Ht 5\' 10"  (1.778 m)  Wt 192 lb (87.091 kg)  BMI 27.55 kg/m2 Patient is very pleasant and in no acute distress. Skin is warm and dry. Color is normal.  HEENT is unremarkable. Normocephalic/atraumatic. PERRL. Sclera are nonicteric. Neck is supple. No masses. No JVD. Lungs are clear. Cardiac exam shows a regular rate and rhythm. Normal S1-2. No gallop or murmur. Abdomen is soft. Extremities are without edema. Gait and ROM are intact. No gross neurologic deficits noted.  LABORATORY DATA: Labs reviewed from 02/24/14 and scanned into Epic. Normal chemistries and CBC. A1c of 7.0%.    Assessment / Plan: 1. CAD - prior CABG in 2006 - negative Myoview in May of 2013. With his significant reflux symptoms I want to make sure he is not ischemic. Will schedule for [employer].  Continue with CV risk factor modification.   2. HTN - blood pressure looks good.   3.HLD - on statin therapy.   4. DM type II. Per primary care.

## 2014-04-11 NOTE — Interval H&P Note (Signed)
History and Physical Interval Note:  04/11/2014 9:35 AM  Alexander Choi  has presented today for surgery, with the diagnosis of abn stress  The various methods of treatment have been discussed with the patient and family. After consideration of risks, benefits and other options for treatment, the patient has consented to  Procedure(s): LEFT HEART CATHETERIZATION WITH CORONARY/GRAFT ANGIOGRAM (N/A) as a surgical intervention .  The patient's history has been reviewed, patient examined, no change in status, stable for surgery.  I have reviewed the patient's chart and labs.  Questions were answered to the patient's satisfaction.   Cath Lab Visit (complete for each Cath Lab visit)  Clinical Evaluation Leading to the Procedure:   ACS: No.  Non-ACS:    Anginal Classification: CCS III  Anti-ischemic medical therapy: Minimal Therapy (1 class of medications)  Non-Invasive Test Results: Intermediate-risk stress test findings: cardiac mortality 1-3%/year  Prior CABG: Previous CABG        [name] 04/11/2014 9:35 AM

## 2014-04-11 NOTE — CV Procedure (Addendum)
    Cardiac Catheterization Procedure Note  Name: RAJVEER HANDLER MRN: [id number] DOB: January 26, 1933  Procedure: Left Heart Cath, Selective Coronary Angiography, SVG and LIMA angiography,LV angiography  Indication: 80 yo WM s/p CABG in 2006 presents with symptoms of severe indigestion. Myoview study demonstrates inferior wall ischemia new since 2013.    Procedural Details: The left wrist was prepped, draped, and anesthetized with 1% lidocaine. Using the modified Seldinger technique, a 6 French slender sheath was introduced into the left radial artery. 3 mg of verapamil was administered through the sheath, weight-based unfractionated heparin was administered intravenously. Standard Judkins catheters were used for selective coronary angiography and left ventriculography. A JR4 catheter was used to image the grafts. A 3DRC catheter was used to image the RCA. Catheter exchanges were performed over an exchange length guidewire. There were no immediate procedural complications. A TR band was used for radial hemostasis at the completion of the procedure.  The patient was transferred to the post catheterization recovery area for further monitoring.  Procedural Findings: Hemodynamics: AO 112/54 mean 77 mm Hg LV 118/15 mm Hg  Coronary angiography: Coronary dominance: right  Left mainstem: 20% proximal narrowing.   Left anterior descending (LAD): The LAD is occluded proximally after the first septal perforator. It is heavily calcified.   There is a small ramus intermediate with a 95% stenosis in the proximal vessel.  Left circumflex (LCx): The LCx is a large vessel with complex disease in the proximal and mid vessel. There is moderate calcification. There is a 90-95% stenosis in the proximal vessel followed by a 90% stenosis in the mid vessel. Between these lesions there is an aneurysmal segment. The first OM has a 95% proximal stenosis and is then occluded in the mid vessel. The Second and third OM  branches are normal. The distal LCx gives a large epicardial collateral to the posterolateral branches of the RCA.  Right coronary artery (RCA): The RCA is heavily calcified. There is a 95% stenosis in the mid vessel. There is diffuse 80-90% disease from the crux into the distal vessel. The PDA is occluded proximally. The PLOM is occluded.  The SVG to the RV marginal branch of the RCA is widely patent.   There is a SVG sequentially to OM1 in the mid and distal vessel is widely patent. It gives collateral flow to the first diagonal.  The LIMA to the LAD is widely patent. There is a focal 90% stenosis in the distal native LAD.  Left ventriculography: Left ventricular systolic function is normal, LVEF is estimated at 60-65%, there is no significant mitral regurgitation   Final Conclusions:   1. Severe 3 vessel obstructive CAD 2. Patent LIMA to the LAD 3. Patent sequential SVG to the OM1 (mid and distal OM) 4. Patent SVG to the [facility] branch of the RCA 5. Normal LV function.  Recommendations: Will review study. Potential areas of ischemia include the distal LAD and the mid to distal LCx with OM2, OM3 and collaterals to the distal RCA. 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   Arick Mareno [name], [place]  04/11/2014, 10:33 AM

## 2014-04-14 ENCOUNTER — Telehealth: Payer: Self-pay | Admitting: Cardiology

## 2014-04-14 ENCOUNTER — Other Ambulatory Visit: Payer: Self-pay | Admitting: Cardiology

## 2014-04-14 MED ORDER — NITROGLYCERIN 0.4 MG SL SUBL: 0.4000 mg | SUBLINGUAL_TABLET | SUBLINGUAL | Status: DC | PRN

## 2014-04-14 MED ORDER — PANTOPRAZOLE SODIUM 40 MG PO TBEC: 40.0000 mg | DELAYED_RELEASE_TABLET | Freq: Every day | ORAL | Status: DC

## 2014-04-14 NOTE — Telephone Encounter (Signed)
Returned call to patient no answer.LMTC. 

## 2014-04-14 NOTE — Telephone Encounter (Signed)
I spoke with [name] by phone. Reviewed results of cardiac cath Friday. Grafts all patent. He does have complex disease involving the LCx that was not grafted and is a potential area of ischemia. In reviewing his cath films this was present even before bypass. Will atttempt medical therapy. He has been having a lot of reflux symptoms especially with wife's illness this year. Will change to Protonix. Interestingly his symptoms are improved with walking and tend to occur at night.   Alexander Blansett [name] MD, [facility]

## 2014-04-14 NOTE — Telephone Encounter (Signed)
I spoke to patient this morning and already addressed this. See my phone note.  Peter [name] MD, [facility]

## 2014-04-14 NOTE — Telephone Encounter (Signed)
Returned call to patient he stated Friday night after cath he had chest pressure across chest lasted appox 2 min.Stated this past Sat 04/12/14 had chest pressure left chest that lasted 5 to 10 min.Stated he chewed a aspirin with relief.Stated he was unable to speak to [place] after cath and he wanted to let Dr.Jordan know about chest pressure and to find out results of cath.Stated he feels better this morning no chest pressure.Message sent to [place].

## 2014-04-14 NOTE — Telephone Encounter (Signed)
Pt had a cath on Friday. Please call,he have been pressure in chest at night.

## 2014-04-14 NOTE — Telephone Encounter (Signed)
Received a call from patient he stated he spoke to Dr.Jordan this morning and has already picked up medication he prescribed.

## 2014-04-24 ENCOUNTER — Telehealth: Payer: Self-pay | Admitting: *Deleted

## 2014-04-24 NOTE — Telephone Encounter (Signed)
Opened chart in error.

## 2014-04-30 ENCOUNTER — Ambulatory Visit (INDEPENDENT_AMBULATORY_CARE_PROVIDER_SITE_OTHER): Payer: Medicare PPO | Admitting: Nurse Practitioner

## 2014-04-30 ENCOUNTER — Encounter: Payer: Self-pay | Admitting: Nurse Practitioner

## 2014-04-30 VITALS — BP 154/58 | HR 51 | Ht 70.0 in | Wt 196.8 lb

## 2014-04-30 DIAGNOSIS — I2510 Atherosclerotic heart disease of native coronary artery without angina pectoris: Secondary | ICD-10-CM

## 2014-04-30 DIAGNOSIS — Z9889 Other specified postprocedural states: Secondary | ICD-10-CM

## 2014-04-30 NOTE — Patient Instructions (Signed)
Stay on your current medicines  See Dr. [name] in 3 to 4 months  Call the [facility] office at [phone] if you have any questions, problems or concerns.

## 2014-04-30 NOTE — Progress Notes (Signed)
[name] Date of Birth: 06-01-1933 Medical Record #[id number]  History of Present Illness: [name] is seen back today for a follow up visit. This is a post cath visit. Seen for Dr. [name]. He has known CAD with prior CABG in 2006, negative Myoview in May of 2013. Other issues include DM, HTN and HLD.   He was seen earlier in October by Dr. [name] - was having GERD symptoms - similar to his past anginal equivalent - Lexiscan obtained and then proceeded with cath. He had been under a great deal of stress this past summer due to his wife's psychiatric problems with severe anxiety but she has since been placed in an assisted living arrangement. .  Comes back today. Here alone.He says he is doing much better. His reflux has improved. No chest pain. Not short of breath. Wife will be leaving the assisted living center soon - says they have her medicines straightened out. He is eating out more - weight is up. No swelling. More limited by arthritis. Thinking about seeing someone about a knee replacement but says that for now "he is getting along ok".   Current Outpatient Prescriptions  Medication Sig Dispense Refill  . Acetaminophen (TYLENOL ARTHRITIS PAIN PO) Take 2 tablets by mouth daily.     Marland Kitchen aspirin 81 MG tablet Take 81 mg by mouth daily.      Marland Kitchen atorvastatin (LIPITOR) 40 MG tablet Take 40 mg by mouth daily.    Marland Kitchen FREESTYLE LITE test strip As directed    . glipiZIDE (GLUCOTROL) 5 MG tablet Take 5 mg by mouth daily before breakfast.     . metFORMIN (GLUCOPHAGE) 500 MG tablet Take 500 mg by mouth 2 (two) times daily with a meal.     . metoprolol succinate (TOPROL-XL) 25 MG 24 hr tablet Take 25 mg by mouth daily.      . nitroGLYCERIN (NITROSTAT) 0.4 MG SL tablet Place 1 tablet (0.4 mg total) under the tongue every 5 (five) minutes as needed for chest pain. 90 tablet 3  . pantoprazole (PROTONIX) 40 MG tablet Take 1 tablet (40 mg total) by mouth daily. 30 tablet 11  .  valsartan-hydrochlorothiazide (DIOVAN-HCT) 160-12.5 MG per tablet Take 1 tablet by mouth daily.       No current facility-administered medications for this visit.    Allergies  Allergen Reactions  . Ace Inhibitors   . Nitrates, Organic   . Penicillins Rash    Past Medical History  Diagnosis Date  . Coronary artery disease     with CABG in 2006 with LIMA to LAD, SVG to mid and distal OM, SVG to AM. Normal Myoview in May of 2013  . Diabetes mellitus     Type 2  . Hypertension   . Hypercholesterolemia   . Cancer   . Prostate cancer   . Loose stools   . Chronic kidney disease     kidney stones  . Hematuria   . Urinary obstruction   . GERD (gastroesophageal reflux disease)     Past Surgical History  Procedure Laterality Date  . Coronary artery bypass graft  2006  . Other surgical history      bowel obstruction  . Radioactive seed implant      for prostate cancer  . Cholecystectomy    . Skin cancer excision      History  Smoking status  . Former Smoker -- 3.00 packs/day  Smokeless tobacco  . Not on file  History  Alcohol Use No    Family History  Problem Relation Age of Onset  . Cancer Mother     Leukemia  . Hypertension Mother   . Cancer Father     Colon  . Heart attack Father     Had Several in his 22's  . Hypertension Father   . Diabetes Father     Review of Systems: The review of systems is per the HPI.  All other systems were reviewed and are negative.  Physical Exam: BP 154/58 mmHg  Pulse 51  Ht 5\' 10"  (1.778 m)  Wt 196 lb 12.8 oz (89.268 kg)  BMI 28.24 kg/m2  SpO2 97% Patient is very pleasant and in no acute distress. His weight is up 8 pounds. Skin is warm and dry. Color is normal.  HEENT is unremarkable. Normocephalic/atraumatic. PERRL. Sclera are nonicteric. Neck is supple. No masses. No JVD. Lungs are clear. Cardiac exam shows a regular rate and rhythm. Abdomen is soft. Extremities are without edema. Gait and ROM are intact. No gross  neurologic deficits noted.  Wt Readings from Last 3 Encounters:  04/30/14 196 lb 12.8 oz (89.268 kg)  04/11/14 188 lb (85.276 kg)  04/04/14 192 lb (87.091 kg)    LABORATORY DATA/PROCEDURES:  Lab Results  Component Value Date   WBC 6.1 04/08/2014   HGB 13.0 04/08/2014   HCT 37.7* 04/08/2014   PLT 215 04/08/2014   GLUCOSE 142* 04/08/2014   NA 138 04/08/2014   K 4.1 04/08/2014   CL 98 04/08/2014   CREATININE 0.75 04/08/2014   BUN 13 04/08/2014   CO2 31 04/08/2014   INR 0.96 04/08/2014    BNP (last 3 results) No results for input(s): PROBNP in the last 8760 hours.  Cardiac Catheterization Procedure Note  Name: HASSELL PATRAS MRN: [id number] DOB: Aug 01, 1932  Procedure: Left Heart Cath, Selective Coronary Angiography, SVG and LIMA angiography,LV angiography  Indication: 80 yo WM s/p CABG in 2006 presents with symptoms of severe indigestion. Myoview study demonstrates inferior wall ischemia new since 2013.   Procedural Details: The left wrist was prepped, draped, and anesthetized with 1% lidocaine. Using the modified Seldinger technique, a 6 French slender sheath was introduced into the left radial artery. 3 mg of verapamil was administered through the sheath, weight-based unfractionated heparin was administered intravenously. Standard Judkins catheters were used for selective coronary angiography and left ventriculography. A JR4 catheter was used to image the grafts. A 3DRC catheter was used to image the RCA. Catheter exchanges were performed over an exchange length guidewire. There were no immediate procedural complications. A TR band was used for radial hemostasis at the completion of the procedure. The patient was transferred to the post catheterization recovery area for further monitoring.  Procedural Findings: Hemodynamics: AO 112/54 mean 77 mm Hg LV 118/15 mm Hg  Coronary angiography: Coronary dominance: right  Left mainstem: 20%  proximal narrowing.   Left anterior descending (LAD): The LAD is occluded proximally after the first septal perforator. It is heavily calcified.   There is a small ramus intermediate with a 95% stenosis in the proximal vessel.  Left circumflex (LCx): The LCx is a large vessel with complex disease in the proximal and mid vessel. There is moderate calcification. There is a 90-95% stenosis in the proximal vessel followed by a 90% stenosis in the mid vessel. Between these lesions there is an aneurysmal segment. The first OM has a 95% proximal stenosis and is then occluded in the mid vessel. The Second  and third OM branches are normal. The distal LCx gives a large epicardial collateral to the posterolateral branches of the RCA.  Right coronary artery (RCA): The RCA is heavily calcified. There is a 95% stenosis in the mid vessel. There is diffuse 80-90% disease from the crux into the distal vessel. The PDA is occluded proximally. The PLOM is occluded.  The SVG to the RV marginal branch of the RCA is widely patent.   There is a SVG sequentially to OM1 in the mid and distal vessel is widely patent. It gives collateral flow to the first diagonal.  The LIMA to the LAD is widely patent. There is a focal 90% stenosis in the distal native LAD.  Left ventriculography: Left ventricular systolic function is normal, LVEF is estimated at 60-65%, there is no significant mitral regurgitation   Final Conclusions:  1. Severe 3 vessel obstructive CAD 2. Patent LIMA to the LAD 3. Patent sequential SVG to the OM1 (mid and distal OM) 4. Patent SVG to the [facility] branch of the RCA 5. Normal LV function.  Recommendations: Will review study. Potential areas of ischemia include the distal LAD and the mid to distal LCx with OM2, OM3 and collaterals to the distal RCA. 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   "I spoke with Mr. Mark by phone. Reviewed results of cardiac cath Friday. Grafts  all patent. He does have complex disease involving the LCx that was not grafted and is a potential area of ischemia. In reviewing his cath films this was present even before bypass. Will atttempt medical therapy. He has been having a lot of reflux symptoms especially with wife's illness this year. Will change to Protonix. Interestingly his symptoms are improved with walking and tend to occur at night."  Peter [name], [place]  04/14/2014, 10:33 AM  Assessment / Plan: 1. S/P cardiac cath - to manage medically - he has improved clinically - would continue on current regimen. See back in 3 to 4 months.   2. CAD  3. GERD -  Improved with change of PPI therapy  4. OA  See back in 3 to 4 months. No change with his current regimen.   Patient is agreeable to this plan and will call if any problems develop in the interim.   Burtis Junes, RN, [place] [address] [place], [place]  [id number] [phone]

## 2014-06-05 ENCOUNTER — Encounter (HOSPITAL_COMMUNITY): Payer: Self-pay | Admitting: Cardiology

## 2014-06-16 ENCOUNTER — Encounter: Payer: Self-pay | Admitting: Cardiology

## 2014-07-10 ENCOUNTER — Ambulatory Visit (INDEPENDENT_AMBULATORY_CARE_PROVIDER_SITE_OTHER): Payer: Medicare PPO

## 2014-07-10 VITALS — BP 140/69 | HR 64 | Resp 12

## 2014-07-10 DIAGNOSIS — M792 Neuralgia and neuritis, unspecified: Secondary | ICD-10-CM

## 2014-07-10 DIAGNOSIS — R269 Unspecified abnormalities of gait and mobility: Secondary | ICD-10-CM

## 2014-07-10 DIAGNOSIS — M19171 Post-traumatic osteoarthritis, right ankle and foot: Secondary | ICD-10-CM

## 2014-07-10 DIAGNOSIS — M7750 Other enthesopathy of unspecified foot: Secondary | ICD-10-CM

## 2014-07-10 DIAGNOSIS — M779 Enthesopathy, unspecified: Secondary | ICD-10-CM

## 2014-07-10 DIAGNOSIS — M7671 Peroneal tendinitis, right leg: Secondary | ICD-10-CM

## 2014-07-10 MED ORDER — CYCLOBENZAPRINE HCL 10 MG PO TABS: 10.0000 mg | ORAL_TABLET | Freq: Two times a day (BID) | ORAL | Status: DC

## 2014-07-10 NOTE — Patient Instructions (Signed)
Peroneal Tendinitis with Rehab Tendonitis is inflammation of a tendon. Inflammation of the tendons on the back of the outer ankle (peroneal tendons) is known as peroneal tendonitis. The peroneal tendons are responsible for connecting the muscles that allow you to stand on your tiptoes to the bones of the ankle. For this reason, peroneal tendonitis often causes pain when trying to complete such motions. Peroneal tendonitis often involves a tear (strain) of the peroneal tendons. Strains are classified into three categories. Grade 1 strains cause pain, but the tendon is not lengthened. Grade 2 strains include a lengthened ligament, due to the ligament being stretched or partially ruptured. With grade 2 strains there is still function, although function may be decreased. Grade 3 strains involve a complete tear of the tendon or muscle, and function is usually impaired. SYMPTOMS   Pain, tenderness, swelling, warmth, or redness over the back of the outer side of the ankle, the outer part of the mid-foot, or the bottom of the arch.  Pain that gets worse with ankle motion (especially when pushing off or pushing down with the front of the foot), or when standing on the ball of the foot or pushing the foot outward.  Crackling sound (crepitation) when the tendon is moved or touched. CAUSES  Peroneal tendinitis occurs when injury to the peroneal tendons causes the body to respond with inflammation. Common causes of injury include:  An overuse injury, in which the groove behind the outer ankle (where the tendon is located) causes wear on the tendon.  A sudden stress placed on the tendon, such as from an increase in the intensity, frequency, or duration of training.  Direct hit (trauma) to the tendon.  Return to activity too soon after a previous ankle injury. RISK INCREASES WITH:  Sports that require sudden, repetitive pushing off of the foot, such as jumping or quick starts.  Kicking and running sports,  especially running down hills or long distances.  Poor strength and flexibility.  Previous injury to the foot, ankle, or leg. PREVENTION  Warm up and stretch properly before activity.  Allow for adequate recovery between workouts.  Maintain physical fitness:  Strength, flexibility, and endurance.  Cardiovascular fitness.  Complete rehabilitation after previous injury. PROGNOSIS  If treated properly, peroneal tendonitis usually heals within 6 weeks.  RELATED COMPLICATIONS  Longer healing time, if not properly treated or if not given enough time to heal.  Recurring symptoms if activity is resumed too soon, with overuse, or when using poor technique.  If untreated, tendinitis may result in tendon rupture, requiring surgery. TREATMENT  Treatment first involves the use of ice and medicine to reduce pain and inflammation. The use of strengthening and stretching exercises may help reduce pain with activity. These exercises may be performed at home or with a therapist. Sometimes, the foot and ankle will be restrained for 10 to 14 days to promote healing. Your caregiver may advise that you place a heel lift in your shoes to reduce the stress placed on the tendon. If nonsurgical treatment is unsuccessful, surgery to remove the inflamed tendon lining (sheath) may be advised.  MEDICATION   If pain medicine is needed, nonsteroidal anti-inflammatory medicines (aspirin and ibuprofen), or other minor pain relievers (acetaminophen), are often advised.  Do not take pain medicine for 7 days before surgery.  Prescription pain relievers may be given, if your caregiver thinks they are needed. Use only as directed and only as much as you need. HEAT AND COLD  Cold treatment (icing) should   be applied for 10 to 15 minutes every 2 to 3 hours for inflammation and pain, and immediately after activity that aggravates your symptoms. Use ice packs or an ice massage.  Heat treatment may be used before  performing stretching and strengthening activities prescribed by your caregiver, physical therapist, or athletic trainer. Use a heat pack or a warm water soak. SEEK MEDICAL CARE IF:  Symptoms get worse or do not improve in 2 to 4 weeks, despite treatment.  New, unexplained symptoms develop. (Drugs used in treatment may produce side effects.) EXERCISES RANGE OF MOTION (ROM) AND STRETCHING EXERCISES - Peroneal Tendinitis These exercises may help you when beginning to rehabilitate your injury. Your symptoms may resolve with or without further involvement from your physician, physical therapist or athletic trainer. While completing these exercises, remember:   Restoring tissue flexibility helps normal motion to return to the joints. This allows healthier, less painful movement and activity.  An effective stretch should be held for at least 30 seconds.  A stretch should never be painful. You should only feel a gentle lengthening or release in the stretched tissue. RANGE OF MOTION - Ankle Eversion  Sit with your right / left ankle crossed over your opposite knee.  Grip your foot with your opposite hand, placing your thumb on the top of your foot and your fingers across the bottom of your foot.  Gently push your foot downward with a slight rotation, so your littlest toes rise slightly toward the ceiling.  You should feel a gentle stretch on the inside of your ankle. Hold the stretch for __________ seconds. Repeat __________ times. Complete this exercise __________ times per day.  RANGE OF MOTION - Ankle Inversion  Sit with your right / left ankle crossed over your opposite knee.  Grip your foot with your opposite hand, placing your thumb on the bottom of your foot and your fingers across the top of your foot.  Gently pull your foot so the smallest toe comes toward you and your thumb pushes the inside of the ball of your foot away from you.  You should feel a gentle stretch on the outside of  your ankle. Hold the stretch for __________ seconds. Repeat __________ times. Complete this exercise __________ times per day.  RANGE OF MOTION - Ankle Plantar Flexion  Sit with your right / left leg crossed over your opposite knee.  Use your opposite hand to pull the top of your foot and toes toward you.  You should feel a gentle stretch on the top of your foot and ankle. Hold this position for __________ seconds. Repeat __________ times. Complete __________ times per day.  STRETCH - Gastroc, Standing  Place your hands on a wall.  Extend your right / left leg behind you, keeping the front knee somewhat bent.  Slightly point your toes inward on your back foot.  Keeping your right / left heel on the floor and your knee straight, shift your weight toward the wall, not allowing your back to arch.  You should feel a gentle stretch in the calf. Hold this position for __________ seconds. Repeat __________ times. Complete this stretch __________ times per day. STRETCH - Soleus, Standing  Place your hands on a wall.  Extend your right / left leg behind you, keeping the other knee somewhat bent.  Slightly point your toes inward on your back foot.  Keep your heel on the floor, bend your back knee, and slightly shift your weight over the back leg so that   you feel a gentle stretch deep in your back calf.  Hold this position for __________ seconds. Repeat __________ times. Complete this stretch __________ times per day. STRETCH - Gastrocsoleus, Standing Note: This exercise can place a lot of stress on your foot and ankle. Please complete this exercise only if specifically instructed by your caregiver.   Place the ball of your right / left foot on a step, keeping your other foot firmly on the same step.  Hold on to the wall or a rail for balance.  Slowly lift your other foot, allowing your body weight to press your heel down over the edge of the step.  You should feel a stretch in your  right / left calf.  Hold this position for __________ seconds.  Repeat this exercise with a slight bend in your knee. Repeat __________ times. Complete this stretch __________ times per day.  STRENGTHENING EXERCISES - Peroneal Tendinitis  These exercises may help you when beginning to rehabilitate your injury. They may resolve your symptoms with or without further involvement from your physician, physical therapist or athletic trainer. While completing these exercises, remember:   Muscles can gain both the endurance and the strength needed for everyday activities through controlled exercises.  Complete these exercises as instructed by your physician, physical therapist or athletic trainer. Increase the resistance and repetitions only as guided by your caregiver. STRENGTH - Dorsiflexors  Secure a rubber exercise band or tubing to a fixed object (table, pole) and loop the other end around your right / left foot.  Sit on the floor facing the fixed object. The band should be slightly tense when your foot is relaxed.  Slowly draw your foot back toward you, using your ankle and toes.  Hold this position for __________ seconds. Slowly release the tension in the band and return your foot to the starting position. Repeat __________ times. Complete this exercise __________ times per day.  STRENGTH - Towel Curls  Sit in a chair, on a non-carpeted surface.  Place your foot on a towel, keeping your heel on the floor.  Pull the towel toward your heel only by curling your toes. Keep your heel on the floor.  If instructed by your physician, physical therapist or athletic trainer, add weight to the end of the towel. Repeat __________ times. Complete this exercise __________ times per day. STRENGTH - Ankle Eversion   Secure one end of a rubber exercise band or tubing to a fixed object (table, pole). Loop the other end around your foot, just before your toes.  Place your fists between your knees.  This will focus your strengthening at your ankle.  Drawing the band across your opposite foot, away from the pole, slowly, pull your little toe out and up. Make sure the band is positioned to resist the entire motion.  Hold this position for __________ seconds.  Have your muscles resist the band, as it slowly pulls your foot back to the starting position. Repeat __________ times. Complete this exercise __________ times per day.  Document Released: 06/13/2005 Document Revised: 10/28/2013 Document Reviewed: 09/25/2008 [employer] Patient Information 2015 [employer]. This information is not intended to replace advice given to you by your health care provider. Make sure you discuss any questions you have with your health care provider.  

## 2014-07-10 NOTE — Progress Notes (Signed)
   Subjective:    Patient ID: Alexander Choi, male    DOB: 28-Oct-1932, 81 y.o.   MRN: [id number]  HPI  ''RT OUTSIDE AND TOP OF THE FOOT IS PAINFUL.''  Review of Systems  All other systems reviewed and are negative.      Objective:   Physical Exam Vascular status appears to be intact pedal pulses palpable DP plus one over 4 PT plus one over 4 bilateral Refill time 3 seconds all digits there is decreased sensation Semmes Weinstein to the forefoot digits and arch. Patient does have some hypersensitivity at times the toes with shooting sensation. Vibratory sensation is absent Achilles tendon reflex is also absent. Patient is has spasming of the foot with peroneal contracture and peroneal spastic type contracture at this time. Cannot relax foot is in an abducted position with pain on palpation of the  Tendon and fifth metatarsal base insertion of the peroneal tendon. 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 Patient does have are osteoarthropathy as well as diabetic neuropathy issues no other new problems or difficulties wearing appropriate diabetic type shoes or stability shoes however movement inversion eversion is painful tender symptomatically guarded by the patient. No pain in the sinus tarsi no open wounds no ulcers no secondary infections mild semirigid digital contractures noted 2 through 5 hallux is relatively rectus       Assessment & Plan:  Assessment this time is diabetic diabetes with history peripheral neuropathy however patient also has biomechanical fault gait abnormality and has valgus deformity of the foot with collapse the medial column and peroneal spasm being noted at this time. Asymmetric joint space narrowing Lisfranc's fourth fifth metatarsal base and cuboid and some loss of joint space in the medial Lisfranc's joint. Patient given  prescription for Flexeril at this time as a muscle relaxant help calm down the peroneal tendon and reduce pain in the muscles cramping and spasming. Patient will maintain a good stable shoe we'll make arrangements for Richie brace casting for AFO brace that may help stabilize the foot and ankle for both arthropathy gait abnormality and peroneal tendinitis. A Richie brace may provide stability and prevent further breakdown and arthrosis of the joints. Will be scheduled for cast the brace with the next 2-4 weeks. Next  Harriet Masson DPM

## 2014-07-21 ENCOUNTER — Ambulatory Visit (INDEPENDENT_AMBULATORY_CARE_PROVIDER_SITE_OTHER): Payer: Medicare PPO | Admitting: *Deleted

## 2014-07-21 VITALS — BP 150/77 | HR 68 | Resp 18

## 2014-07-21 DIAGNOSIS — M7671 Peroneal tendinitis, right leg: Secondary | ICD-10-CM

## 2014-07-21 NOTE — Patient Instructions (Signed)
PATIENT WAS CASTED TODAY FOR RITCHIE BRACE

## 2014-07-21 NOTE — Progress Notes (Signed)
   Subjective:    Patient ID: Alexander Choi, male    DOB: August 08, 1932, 81 y.o.   MRN: [id number]  HPI I AM HERE TO GET CASTED FOR RITCHIE BRACE    Review of Systems     Objective:   Physical Exam        Assessment & Plan:

## 2014-07-28 ENCOUNTER — Encounter: Payer: Self-pay | Admitting: Cardiology

## 2014-07-28 ENCOUNTER — Ambulatory Visit (INDEPENDENT_AMBULATORY_CARE_PROVIDER_SITE_OTHER): Payer: Medicare PPO | Admitting: Cardiology

## 2014-07-28 VITALS — BP 120/64 | HR 73 | Ht 70.0 in | Wt 192.0 lb

## 2014-07-28 DIAGNOSIS — E7800 Pure hypercholesterolemia, unspecified: Secondary | ICD-10-CM

## 2014-07-28 DIAGNOSIS — I2510 Atherosclerotic heart disease of native coronary artery without angina pectoris: Secondary | ICD-10-CM

## 2014-07-28 DIAGNOSIS — I10 Essential (primary) hypertension: Secondary | ICD-10-CM

## 2014-07-28 DIAGNOSIS — E119 Type 2 diabetes mellitus without complications: Secondary | ICD-10-CM

## 2014-07-28 NOTE — Patient Instructions (Signed)
Continue your current therapy  I will see you in 6 months.   

## 2014-07-28 NOTE — Progress Notes (Signed)
Alexander Choi Date of Birth: [birth year] Medical Record #[id number]  History of Present Illness: Alexander Choi is seen for follow up CAD.He has known CAD with prior CABG in 2006, negative Myoview in May of 2013. Other issues include DM, HTN and HLD.   He was seen earlier in October - was having GERD symptoms - similar to his past anginal equivalent - Lexiscan obtained and then proceeded with cath. cath results noted below. Treated medically. He had been under a great deal of stress this past summer due to his wife's psychiatric problems- currently in a nursing facility.   On follow up today he reports he is doing well. No dyspnea. He had one episode of chest pain that resolved with ASA. He has completed his lipid trial ?CTEP inhibitor. He has lost 4 lbs and states he has been able to exercise more.   Current Outpatient Prescriptions  Medication Sig Dispense Refill  . Acetaminophen (TYLENOL ARTHRITIS PAIN PO) Take 2 tablets by mouth daily.     Marland Kitchen aspirin 81 MG tablet Take 81 mg by mouth daily.      Marland Kitchen atorvastatin (LIPITOR) 40 MG tablet Take 40 mg by mouth daily.    . cyclobenzaprine (FLEXERIL) 10 MG tablet Take 1 tablet (10 mg total) by mouth 2 (two) times daily with a meal. 60 tablet 1  . FREESTYLE LITE test strip As directed    . glipiZIDE (GLUCOTROL) 5 MG tablet Take 5 mg by mouth daily before breakfast.     . metFORMIN (GLUCOPHAGE) 500 MG tablet Take 500 mg by mouth 2 (two) times daily with a meal.     . metoprolol succinate (TOPROL-XL) 25 MG 24 hr tablet Take 25 mg by mouth daily.      Marland Kitchen MYRBETRIQ 50 MG TB24 tablet     . nitroGLYCERIN (NITROSTAT) 0.4 MG SL tablet Place 1 tablet (0.4 mg total) under the tongue every 5 (five) minutes as needed for chest pain. 90 tablet 3  . pantoprazole (PROTONIX) 40 MG tablet Take 1 tablet (40 mg total) by mouth daily. 30 tablet 11  . valsartan-hydrochlorothiazide (DIOVAN-HCT) 160-12.5 MG per tablet Take 1 tablet by mouth daily.       No current  facility-administered medications for this visit.    Allergies  Allergen Reactions  . Ace Inhibitors   . Nitrates, Organic   . Penicillins Rash    Past Medical History  Diagnosis Date  . Coronary artery disease     with CABG in 2006 with LIMA to LAD, SVG to mid and distal OM, SVG to AM. Normal Myoview in May of 2013  . Diabetes mellitus     Type 2  . Hypertension   . Hypercholesterolemia   . Cancer   . Prostate cancer   . Loose stools   . Chronic kidney disease     kidney stones  . Hematuria   . Urinary obstruction   . GERD (gastroesophageal reflux disease)     Past Surgical History  Procedure Laterality Date  . Coronary artery bypass graft  2006  . Other surgical history      bowel obstruction  . Radioactive seed implant      for prostate cancer  . Cholecystectomy    . Skin cancer excision    . Left heart catheterization with coronary/graft angiogram N/A 04/11/2014    Procedure: LEFT HEART CATHETERIZATION WITH [name];  Surgeon: Sierra Bissonette M [name], MD;  Location: [facility] CATH LAB;  Service: Cardiovascular;  Laterality: N/A;  History  Smoking status  . Former Smoker -- 3.00 packs/day  Smokeless tobacco  . Not on file    History  Alcohol Use No    Family History  Problem Relation Age of Onset  . Cancer Mother     Leukemia  . Hypertension Mother   . Cancer Father     Colon  . Heart attack Father     Had Several in his 80's  . Hypertension Father   . Diabetes Father     Review of Systems: The review of systems is per the HPI.  All other systems were reviewed and are negative.  Physical Exam: BP 120/64 mmHg  Pulse 73  Ht 5\' 10"  (1.778 m)  Wt 192 lb (87.091 kg)  BMI 27.55 kg/m2 Patient is very pleasant and in no acute distress. His weight is up 8 pounds. Skin is warm and dry. Color is normal.  HEENT is unremarkable. Normocephalic/atraumatic. PERRL. Sclera are nonicteric. Neck is supple. No masses. No JVD. Lungs are clear. Cardiac exam  shows a regular rate and rhythm. Abdomen is soft. Extremities are without edema. Gait and ROM are intact. No gross neurologic deficits noted.  Wt Readings from Last 3 Encounters:  07/28/14 192 lb (87.091 kg)  04/30/14 196 lb 12.8 oz (89.268 kg)  04/11/14 188 lb (85.276 kg)    LABORATORY DATA/PROCEDURES:  Lab Results  Component Value Date   WBC 6.1 04/08/2014   HGB 13.0 04/08/2014   HCT 37.7* 04/08/2014   PLT 215 04/08/2014   GLUCOSE 142* 04/08/2014   NA 138 04/08/2014   K 4.1 04/08/2014   CL 98 04/08/2014   CREATININE 0.75 04/08/2014   BUN 13 04/08/2014   CO2 31 04/08/2014   INR 0.96 04/08/2014    BNP (last 3 results) No results for input(s): PROBNP in the last 8760 hours.  Cardiac Catheterization Procedure Note  Name: Alexander Choi MRN: [id number] DOB: 27-Feb-1933  Procedure: Left Heart Cath, Selective Coronary Angiography, SVG and LIMA angiography,LV angiography  Indication: 81 yo WM s/p CABG in 2006 presents with symptoms of severe indigestion. Myoview study demonstrates inferior wall ischemia new since 2013.   Procedural Details: The left wrist was prepped, draped, and anesthetized with 1% lidocaine. Using the modified Seldinger technique, a 6 French slender sheath was introduced into the left radial artery. 3 mg of verapamil was administered through the sheath, weight-based unfractionated heparin was administered intravenously. Standard Judkins catheters were used for selective coronary angiography and left ventriculography. A JR4 catheter was used to image the grafts. A 3DRC catheter was used to image the RCA. Catheter exchanges were performed over an exchange length guidewire. There were no immediate procedural complications. A TR band was used for radial hemostasis at the completion of the procedure. The patient was transferred to the post catheterization recovery area for further monitoring.  Procedural Findings: Hemodynamics: AO  112/54 mean 77 mm Hg LV 118/15 mm Hg  Coronary angiography: Coronary dominance: right  Left mainstem: 20% proximal narrowing.   Left anterior descending (LAD): The LAD is occluded proximally after the first septal perforator. It is heavily calcified.   There is a small ramus intermediate with a 95% stenosis in the proximal vessel.  Left circumflex (LCx): The LCx is a large vessel with complex disease in the proximal and mid vessel. There is moderate calcification. There is a 90-95% stenosis in the proximal vessel followed by a 90% stenosis in the mid vessel. Between these lesions there is an aneurysmal segment. The  first OM has a 95% proximal stenosis and is then occluded in the mid vessel. The Second and third OM branches are normal. The distal LCx gives a large epicardial collateral to the posterolateral branches of the RCA.  Right coronary artery (RCA): The RCA is heavily calcified. There is a 95% stenosis in the mid vessel. There is diffuse 80-90% disease from the crux into the distal vessel. The PDA is occluded proximally. The PLOM is occluded.  The SVG to the RV marginal branch of the RCA is widely patent.   There is a SVG sequentially to OM1 in the mid and distal vessel is widely patent. It gives collateral flow to the first diagonal.  The LIMA to the LAD is widely patent. There is a focal 90% stenosis in the distal native LAD.  Left ventriculography: Left ventricular systolic function is normal, LVEF is estimated at 60-65%, there is no significant mitral regurgitation   Final Conclusions:  1. Severe 3 vessel obstructive CAD 2. Patent LIMA to the LAD 3. Patent sequential SVG to the OM1 (mid and distal OM) 4. Patent SVG to the [facility] Alexander Choi branch of the RCA 5. Normal LV function.  Recommendations: Will review study. Potential areas of ischemia include the distal LAD and the mid to distal LCx with OM2, OM3 and collaterals to the distal RCA. The disease in the proximal to mid LCx is very  complex and aneurysmal.   "I spoke with [name] by phone. Reviewed results of cardiac cath Friday. Grafts all patent. He does have complex disease involving the LCx that was not grafted and is a potential area of ischemia. In reviewing his cath films this was present even before bypass. Will atttempt medical therapy. He has been having a lot of reflux symptoms especially with wife's illness this year. Will change to Protonix. Interestingly his symptoms are improved with walking and tend to occur at night."  Alexander Choi, [place]  04/14/2014, 10:33 AM  Assessment / Plan: 1. CAD- s/p CABG 2006. Grafts are patent. To manage medically - he has improved clinically - would continue on current regimen. I will follow up in 6 months.  2. Hyperlipidemia. On statin.  3. GERD -  Improved with change of PPI therapy  4. DM type 2. Encourage further weight loss.

## 2014-08-15 ENCOUNTER — Ambulatory Visit (INDEPENDENT_AMBULATORY_CARE_PROVIDER_SITE_OTHER): Payer: Medicare PPO

## 2014-08-15 ENCOUNTER — Ambulatory Visit: Payer: Medicare PPO

## 2014-08-15 DIAGNOSIS — M19171 Post-traumatic osteoarthritis, right ankle and foot: Secondary | ICD-10-CM | POA: Diagnosis not present

## 2014-08-15 DIAGNOSIS — R269 Unspecified abnormalities of gait and mobility: Secondary | ICD-10-CM | POA: Diagnosis not present

## 2014-08-15 DIAGNOSIS — M779 Enthesopathy, unspecified: Secondary | ICD-10-CM | POA: Diagnosis not present

## 2014-08-15 DIAGNOSIS — M7671 Peroneal tendinitis, right leg: Secondary | ICD-10-CM | POA: Diagnosis not present

## 2014-08-15 DIAGNOSIS — M25371 Other instability, right ankle: Secondary | ICD-10-CM

## 2014-08-15 DIAGNOSIS — M7750 Other enthesopathy of unspecified foot: Secondary | ICD-10-CM

## 2014-08-15 NOTE — Patient Instructions (Signed)
Patient was in office today to pick up brace. Alexander Choi

## 2014-08-15 NOTE — Patient Instructions (Signed)

## 2014-08-15 NOTE — Progress Notes (Signed)
   Subjective:    Patient ID: Alexander Choi, male    DOB: 1933/06/15, 81 y.o.   MRN: [id number]  HPI I am here to get fitted for my brace    Review of Systems no new findings or systemic changes noted     Objective:   Physical Exam Neurovascular status unchanged pedal pulses palpable no other new findings or complications noted the AFO is dispensed with oral and written break in instructions for use the AFO fits and contour as well as the foot and ankle as designed. The AFO is dispensed and fitted to the foot as well as the accommodative shoe.       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 Plan at this time AFO is dispensed with oral and written instructions follow-up within the next one to 2 months for reevaluation adjustments as needed in the future.  Harriet Masson DPM

## 2014-09-05 ENCOUNTER — Encounter: Payer: Self-pay | Admitting: Cardiology

## 2014-09-26 ENCOUNTER — Ambulatory Visit: Payer: Medicare PPO

## 2014-10-09 ENCOUNTER — Ambulatory Visit (INDEPENDENT_AMBULATORY_CARE_PROVIDER_SITE_OTHER): Payer: Medicare PPO

## 2014-10-09 VITALS — BP 144/75 | HR 54 | Resp 18

## 2014-10-09 DIAGNOSIS — M19171 Post-traumatic osteoarthritis, right ankle and foot: Secondary | ICD-10-CM | POA: Diagnosis not present

## 2014-10-09 DIAGNOSIS — M779 Enthesopathy, unspecified: Secondary | ICD-10-CM | POA: Diagnosis not present

## 2014-10-09 DIAGNOSIS — M7750 Other enthesopathy of unspecified foot: Secondary | ICD-10-CM

## 2014-10-09 NOTE — Progress Notes (Signed)
Patient presents today for follow-up of there is an AFO brace for the right foot. He states that it is comfortable he's been wearing it for the last 2 months without problems.  Objective: Neurovascular status is intact and unchanged.  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 Collapsing arch height with a prominent navicular is also present. Hallux is also laterally deviating with a mild bunion deformity present.  Assessment: Ankle arthropathy, ankle instability and peroneal tendinitis right  Plan: AFO is noted to fit the foot nicely. When standing his arch is supported nicely and his ankle was also supported at 90. Overall he is happy with the device. If any concerns arise he will call otherwise he'll be seen back as needed.

## 2014-12-03 DIAGNOSIS — E1140 Type 2 diabetes mellitus with diabetic neuropathy, unspecified: Secondary | ICD-10-CM | POA: Diagnosis not present

## 2014-12-03 DIAGNOSIS — E785 Hyperlipidemia, unspecified: Secondary | ICD-10-CM | POA: Diagnosis not present

## 2014-12-03 DIAGNOSIS — D649 Anemia, unspecified: Secondary | ICD-10-CM | POA: Diagnosis not present

## 2014-12-05 ENCOUNTER — Encounter: Payer: Self-pay | Admitting: Cardiology

## 2015-01-26 ENCOUNTER — Encounter: Payer: Self-pay | Admitting: Cardiology

## 2015-01-26 ENCOUNTER — Ambulatory Visit (INDEPENDENT_AMBULATORY_CARE_PROVIDER_SITE_OTHER): Payer: Medicare PPO | Admitting: Cardiology

## 2015-01-26 VITALS — BP 138/66 | HR 74 | Ht 70.0 in | Wt 190.0 lb

## 2015-01-26 DIAGNOSIS — E7800 Pure hypercholesterolemia, unspecified: Secondary | ICD-10-CM

## 2015-01-26 DIAGNOSIS — I25708 Atherosclerosis of coronary artery bypass graft(s), unspecified, with other forms of angina pectoris: Secondary | ICD-10-CM | POA: Diagnosis not present

## 2015-01-26 DIAGNOSIS — I10 Essential (primary) hypertension: Secondary | ICD-10-CM

## 2015-01-26 MED ORDER — PANTOPRAZOLE SODIUM 40 MG PO TBEC: 40.0000 mg | DELAYED_RELEASE_TABLET | Freq: Every day | ORAL | Status: DC

## 2015-01-26 NOTE — Progress Notes (Signed)
Alexander Choi Date of Birth: [birth year] Medical Record #[id number]  History of Present Illness: Alexander Choi is seen for follow up CAD.He has known CAD with prior CABG in 2006, negative Myoview in October 2015 showed inferior ischemia. Cardiac cath showed all grafts patent. There was severe disease in the native LCx that predated his CABG. Other issues include DM, HTN and HLD.  On follow up today he reports he is doing well. No dyspnea. Minimal symptoms of chest discomfort relieved with acid reflux therapy.  He is walking 30 minutes twice a day without restriction. Having more difficulty with IBS.   Current Outpatient Prescriptions  Medication Sig Dispense Refill  . Acetaminophen (TYLENOL ARTHRITIS PAIN PO) Take 2 tablets by mouth daily.     Marland Kitchen aspirin 81 MG tablet Take 81 mg by mouth daily.      Marland Kitchen atorvastatin (LIPITOR) 40 MG tablet Take 40 mg by mouth daily.    . cyclobenzaprine (FLEXERIL) 10 MG tablet Take 1 tablet (10 mg total) by mouth 2 (two) times daily with a meal. 60 tablet 1  . Eluxadoline (VIBERZI) 75 MG TABS Take 75 mg by mouth 2 (two) times daily.    Marland Kitchen FREESTYLE LITE test strip As directed    . gabapentin (NEURONTIN) 100 MG capsule Take 100 mg by mouth 3 (three) times daily.    Marland Kitchen glipiZIDE (GLUCOTROL) 5 MG tablet Take 5 mg by mouth daily before breakfast.     . metFORMIN (GLUCOPHAGE) 500 MG tablet Take 500 mg by mouth 2 (two) times daily with a meal.     . metoprolol succinate (TOPROL-XL) 25 MG 24 hr tablet Take 25 mg by mouth daily.      Marland Kitchen MYRBETRIQ 50 MG TB24 tablet     . nitroGLYCERIN (NITROSTAT) 0.4 MG SL tablet Place 1 tablet (0.4 mg total) under the tongue every 5 (five) minutes as needed for chest pain. 90 tablet 3  . pantoprazole (PROTONIX) 40 MG tablet Take 1 tablet (40 mg total) by mouth daily. 90 tablet 3  . valsartan-hydrochlorothiazide (DIOVAN-HCT) 160-12.5 MG per tablet Take 1 tablet by mouth daily.       No current facility-administered medications for this  visit.    Allergies  Allergen Reactions  . Ace Inhibitors   . Nitrates, Organic   . Penicillins Rash    Past Medical History  Diagnosis Date  . Coronary artery disease     with CABG in 2006 with LIMA to LAD, SVG to mid and distal OM, SVG to AM. Normal Myoview in May of 2013  . Diabetes mellitus     Type 2  . Hypertension   . Hypercholesterolemia   . Cancer   . Prostate cancer   . Loose stools   . Chronic kidney disease     kidney stones  . Hematuria   . Urinary obstruction   . GERD (gastroesophageal reflux disease)   . Irritable bowel syndrome     Past Surgical History  Procedure Laterality Date  . Coronary artery bypass graft  2006  . Other surgical history      bowel obstruction  . Radioactive seed implant      for prostate cancer  . Cholecystectomy    . Skin cancer excision    . Left heart catheterization with coronary/graft angiogram N/A 04/11/2014    Procedure: LEFT HEART CATHETERIZATION WITH [name];  Surgeon: Demetress Tift M [name], MD;  Location: [facility] CATH LAB;  Service: Cardiovascular;  Laterality: N/A;  History  Smoking status  . Former Smoker -- 3.00 packs/day  Smokeless tobacco  . Not on file    History  Alcohol Use No    Family History  Problem Relation Age of Onset  . Cancer Mother     Leukemia  . Hypertension Mother   . Cancer Father     Colon  . Heart attack Father     Had Several in his 35's  . Hypertension Father   . Diabetes Father     Review of Systems: The review of systems is per the HPI.  All other systems were reviewed and are negative.  Physical Exam: BP 138/66 mmHg  Pulse 74  Ht 5\' 10"  (1.778 m)  Wt 86.183 kg (190 lb)  BMI 27.26 kg/m2 Patient is very pleasant and in no acute distress. His weight is up 8 pounds. Skin is warm and dry. Color is normal.  HEENT is unremarkable. Normocephalic/atraumatic. PERRL. Sclera are nonicteric. Neck is supple. No masses. No JVD. Lungs are clear. Cardiac exam shows a regular  rate and rhythm. Abdomen is soft. Extremities are without edema. Gait and ROM are intact. No gross neurologic deficits noted.  Wt Readings from Last 3 Encounters:  01/26/15 86.183 kg (190 lb)  07/28/14 87.091 kg (192 lb)  04/30/14 89.268 kg (196 lb 12.8 oz)    LABORATORY DATA/PROCEDURES:  Lab Results  Component Value Date   WBC 6.1 04/08/2014   HGB 13.0 04/08/2014   HCT 37.7* 04/08/2014   PLT 215 04/08/2014   GLUCOSE 142* 04/08/2014   NA 138 04/08/2014   K 4.1 04/08/2014   CL 98 04/08/2014   CREATININE 0.75 04/08/2014   BUN 13 04/08/2014   CO2 31 04/08/2014   INR 0.96 04/08/2014    BNP (last 3 results) No results for input(s): PROBNP in the last 8760 hours.  Cardiac Catheterization Procedure Note  Name: Alexander Choi MRN: [id number] DOB: [birth year]  Procedure: Left Heart Cath, Selective Coronary Angiography, SVG and LIMA angiography,LV angiography  Indication: 81 yo WM s/p CABG in 2006 presents with symptoms of severe indigestion. Myoview study demonstrates inferior wall ischemia new since 2013.   Procedural Details: The left wrist was prepped, draped, and anesthetized with 1% lidocaine. Using the modified Seldinger technique, a 6 French slender sheath was introduced into the left radial artery. 3 mg of verapamil was administered through the sheath, weight-based unfractionated heparin was administered intravenously. Standard Judkins catheters were used for selective coronary angiography and left ventriculography. A JR4 catheter was used to image the grafts. A 3DRC catheter was used to image the RCA. Catheter exchanges were performed over an exchange length guidewire. There were no immediate procedural complications. A TR band was used for radial hemostasis at the completion of the procedure. The patient was transferred to the post catheterization recovery area for further monitoring.  Procedural Findings: Hemodynamics: AO 112/54 mean 77 mm  Hg LV 118/15 mm Hg  Coronary angiography: Coronary dominance: right  Left mainstem: 20% proximal narrowing.   Left anterior descending (LAD): The LAD is occluded proximally after the first septal perforator. It is heavily calcified.   There is a small ramus intermediate with a 95% stenosis in the proximal vessel.  Left circumflex (LCx): The LCx is a large vessel with complex disease in the proximal and mid vessel. There is moderate calcification. There is a 90-95% stenosis in the proximal vessel followed by a 90% stenosis in the mid vessel. Between these lesions there is an aneurysmal segment. The  first OM has a 95% proximal stenosis and is then occluded in the mid vessel. The Second and third OM branches are normal. The distal LCx gives a large epicardial collateral to the posterolateral branches of the RCA.  Right coronary artery (RCA): The RCA is heavily calcified. There is a 95% stenosis in the mid vessel. There is diffuse 80-90% disease from the crux into the distal vessel. The PDA is occluded proximally. The PLOM is occluded.  The SVG to the RV marginal branch of the RCA is widely patent.   There is a SVG sequentially to OM1 in the mid and distal vessel is widely patent. It gives collateral flow to the first diagonal.  The LIMA to the LAD is widely patent. There is a focal 90% stenosis in the distal native LAD.  Left ventriculography: Left ventricular systolic function is normal, LVEF is estimated at 60-65%, there is no significant mitral regurgitation   Final Conclusions:  1. Severe 3 vessel obstructive CAD 2. Patent LIMA to the LAD 3. Patent sequential SVG to the OM1 (mid and distal OM) 4. Patent SVG to the [facility] branch of the RCA 5. Normal LV function.  Recommendations: Will review study. Potential areas of ischemia include the distal LAD and the mid to distal LCx with OM2, OM3 and collaterals to the distal RCA. The disease in the proximal to mid LCx is very complex and  aneurysmal.   Reatha Sur [name], [place]  04/14/2014, 10:33 AM  Assessment / Plan: 1. CAD- s/p CABG 2006. Grafts are patent. Will continue to manage medically - he has improved clinically - would continue on current regimen. I will follow up in 6 months.  2. Hyperlipidemia. On statin.  3. GERD -  Improved with  PPI therapy  4. DM type 2. Encourage further weight loss.

## 2015-01-26 NOTE — Patient Instructions (Signed)
Continue your current therapy  I will see you in 6 months.   

## 2015-03-11 DIAGNOSIS — I25810 Atherosclerosis of coronary artery bypass graft(s) without angina pectoris: Secondary | ICD-10-CM | POA: Diagnosis not present

## 2015-03-11 DIAGNOSIS — E785 Hyperlipidemia, unspecified: Secondary | ICD-10-CM | POA: Diagnosis not present

## 2015-03-11 DIAGNOSIS — E1140 Type 2 diabetes mellitus with diabetic neuropathy, unspecified: Secondary | ICD-10-CM | POA: Diagnosis not present

## 2015-03-11 DIAGNOSIS — Z1389 Encounter for screening for other disorder: Secondary | ICD-10-CM | POA: Diagnosis not present

## 2015-03-11 DIAGNOSIS — Z23 Encounter for immunization: Secondary | ICD-10-CM | POA: Diagnosis not present

## 2015-03-11 DIAGNOSIS — Z6827 Body mass index (BMI) 27.0-27.9, adult: Secondary | ICD-10-CM | POA: Diagnosis not present

## 2015-03-11 DIAGNOSIS — D638 Anemia in other chronic diseases classified elsewhere: Secondary | ICD-10-CM | POA: Diagnosis not present

## 2015-03-11 DIAGNOSIS — I10 Essential (primary) hypertension: Secondary | ICD-10-CM | POA: Diagnosis not present

## 2015-03-12 ENCOUNTER — Encounter: Payer: Self-pay | Admitting: Cardiology

## 2015-04-01 DIAGNOSIS — E1140 Type 2 diabetes mellitus with diabetic neuropathy, unspecified: Secondary | ICD-10-CM | POA: Diagnosis not present

## 2015-04-10 DIAGNOSIS — Z79899 Other long term (current) drug therapy: Secondary | ICD-10-CM | POA: Diagnosis not present

## 2015-04-10 DIAGNOSIS — I2510 Atherosclerotic heart disease of native coronary artery without angina pectoris: Secondary | ICD-10-CM | POA: Diagnosis not present

## 2015-04-10 DIAGNOSIS — I119 Hypertensive heart disease without heart failure: Secondary | ICD-10-CM | POA: Diagnosis not present

## 2015-04-10 DIAGNOSIS — I160 Hypertensive urgency: Secondary | ICD-10-CM | POA: Diagnosis not present

## 2015-04-10 DIAGNOSIS — I10 Essential (primary) hypertension: Secondary | ICD-10-CM | POA: Diagnosis not present

## 2015-04-10 DIAGNOSIS — R51 Headache: Secondary | ICD-10-CM | POA: Diagnosis not present

## 2015-04-11 DIAGNOSIS — I10 Essential (primary) hypertension: Secondary | ICD-10-CM | POA: Diagnosis not present

## 2015-05-06 DIAGNOSIS — I10 Essential (primary) hypertension: Secondary | ICD-10-CM | POA: Diagnosis not present

## 2015-05-29 DIAGNOSIS — M1711 Unilateral primary osteoarthritis, right knee: Secondary | ICD-10-CM | POA: Diagnosis not present

## 2015-05-29 DIAGNOSIS — M19071 Primary osteoarthritis, right ankle and foot: Secondary | ICD-10-CM | POA: Diagnosis not present

## 2015-06-05 ENCOUNTER — Ambulatory Visit (INDEPENDENT_AMBULATORY_CARE_PROVIDER_SITE_OTHER): Payer: Medicare PPO | Admitting: Cardiology

## 2015-06-05 ENCOUNTER — Encounter: Payer: Self-pay | Admitting: Cardiology

## 2015-06-05 VITALS — BP 134/70 | HR 59 | Ht 70.0 in | Wt 196.5 lb

## 2015-06-05 DIAGNOSIS — E119 Type 2 diabetes mellitus without complications: Secondary | ICD-10-CM | POA: Diagnosis not present

## 2015-06-05 DIAGNOSIS — I10 Essential (primary) hypertension: Secondary | ICD-10-CM

## 2015-06-05 DIAGNOSIS — E7800 Pure hypercholesterolemia, unspecified: Secondary | ICD-10-CM

## 2015-06-05 DIAGNOSIS — I25810 Atherosclerosis of coronary artery bypass graft(s) without angina pectoris: Secondary | ICD-10-CM | POA: Diagnosis not present

## 2015-06-05 MED ORDER — NITROGLYCERIN 0.4 MG SL SUBL: 0.4000 mg | SUBLINGUAL_TABLET | SUBLINGUAL | Status: DC | PRN

## 2015-06-05 NOTE — Progress Notes (Signed)
Alexander Choi Date of Birth: [birth year] Medical Record [id number]  History of Present Illness: Alexander Choi is seen for follow up CAD. He has known CAD with prior CABG in 2006, negative Myoview in October 2015 showed inferior ischemia. Cardiac cath showed all grafts patent. There was severe disease in the native LCx that predated his CABG. Other issues include DM, HTN and HLD.  On follow up today he reports he is doing well. No dyspnea or chest pain.  He notes his blood pressure was high and his Valsartan dose was doubled with good effect. His BS has been running higher. 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 He is planning on having TKR.   Current Outpatient Prescriptions  Medication Sig Dispense Refill  . Acetaminophen (TYLENOL ARTHRITIS PAIN PO) Take 2 tablets by mouth daily.     Marland Kitchen aspirin 81 MG tablet Take 81 mg by mouth daily.      Marland Kitchen atorvastatin (LIPITOR) 40 MG tablet Take 40 mg by mouth daily.    Marland Kitchen FREESTYLE LITE test strip As directed    . gabapentin (NEURONTIN) 100 MG capsule Take 100 mg by mouth 3 (three) times daily.    Marland Kitchen glipiZIDE (GLUCOTROL) 5 MG tablet Take 5 mg by mouth daily before breakfast.     . metFORMIN (GLUCOPHAGE) 500 MG tablet Take 500 mg by mouth 2 (two) times daily with a meal.     . metoprolol succinate (TOPROL-XL) 25 MG 24 hr tablet Take 25 mg by mouth daily.      . nitroGLYCERIN (NITROSTAT) 0.4 MG SL tablet Place 1 tablet (0.4 mg total) under the tongue every 5 (five) minutes as needed for chest pain. 90 tablet 3  . pantoprazole (PROTONIX) 40 MG tablet Take 1 tablet (40 mg total) by mouth daily. 90 tablet 3  . valsartan-hydrochlorothiazide (DIOVAN-HCT) 320-25 MG tablet Take 1 tablet by mouth daily. Take 1 tab daily     No current facility-administered medications for this visit.    Allergies  Allergen Reactions  . Ace Inhibitors   . Nitrates, Organic   . Penicillins Rash    Past Medical History    Diagnosis Date  . Coronary artery disease     with CABG in 2006 with LIMA to LAD, SVG to mid and distal OM, SVG to AM. Normal Myoview in May of 2013  . Diabetes mellitus     Type 2  . Hypertension   . Hypercholesterolemia   . Cancer ([place])   . Prostate cancer ([place])   . Loose stools   . Chronic kidney disease     kidney stones  . Hematuria   . Urinary obstruction   . GERD (gastroesophageal reflux disease)   . Irritable bowel syndrome     Past Surgical History  Procedure Laterality Date  . Coronary artery bypass graft  2006  . Other surgical history      bowel obstruction  . Radioactive seed implant      for prostate cancer  . Cholecystectomy    . Skin cancer excision    . Left heart catheterization with coronary/graft angiogram N/A 04/11/2014    Procedure: LEFT HEART CATHETERIZATION WITH [name];  Surgeon: Alexander Grand M [name], MD;  Location: [facility] CATH LAB;  Service: Cardiovascular;  Laterality: N/A;    History  Smoking status  . Former Smoker -- 3.00 packs/day  Smokeless tobacco  . Not on file    History  Alcohol Use No    Family History  Problem Relation Age of Onset  . Cancer Mother     Leukemia  . Hypertension Mother   . Cancer Father     Colon  . Heart attack Father     Had Several in his 80's  . Hypertension Father   . Diabetes Father     Review of Systems: The review of systems is per the HPI.  All other systems were reviewed and are negative.  Physical Exam: BP 134/70 mmHg  Pulse 59  Ht 5\' 10"  (1.778 m)  Wt 89.132 kg (196 lb 8 oz)  BMI 28.19 kg/m2 Patient is very pleasant and in no acute distress.  Skin is warm and dry. Color is normal.  HEENT is unremarkable. Normocephalic/atraumatic. PERRL. Sclera are nonicteric. Neck is supple. No masses. No JVD. Lungs are clear. Cardiac exam shows a regular rate and rhythm. Abdomen is soft. Extremities are without edema. Gait and ROM are intact. No gross neurologic deficits noted.  Wt Readings  from Last 3 Encounters:  06/05/15 89.132 kg (196 lb 8 oz)  01/26/15 86.183 kg (190 lb)  07/28/14 87.091 kg (192 lb)    LABORATORY DATA/PROCEDURES:  Lab Results  Component Value Date   WBC 6.1 04/08/2014   HGB 13.0 04/08/2014   HCT 37.7* 04/08/2014   PLT 215 04/08/2014   GLUCOSE 142* 04/08/2014   NA 138 04/08/2014   K 4.1 04/08/2014   CL 98 04/08/2014   CREATININE 0.75 04/08/2014   BUN 13 04/08/2014   CO2 31 04/08/2014   INR 0.96 04/08/2014    BNP (last 3 results) No results for input(s): PROBNP in the last 8760 hours.  Cardiac Catheterization Procedure Note  Name: Alexander Choi MRN: [id number] DOB: February 25, 1933  Procedure: Left Heart Cath, Selective Coronary Angiography, SVG and LIMA angiography,LV angiography  Indication: 81 yo WM s/p CABG in 2006 presents with symptoms of severe indigestion. Myoview study demonstrates inferior wall ischemia new since 2013.   Procedural Details: The left wrist was prepped, draped, and anesthetized with 1% lidocaine. Using the modified Seldinger technique, a 6 French slender sheath was introduced into the left radial artery. 3 mg of verapamil was administered through the sheath, weight-based unfractionated heparin was administered intravenously. Standard Judkins catheters were used for selective coronary angiography and left ventriculography. A JR4 catheter was used to image the grafts. A 3DRC catheter was used to image the RCA. Catheter exchanges were performed over an exchange length guidewire. There were no immediate procedural complications. A TR band was used for radial hemostasis at the completion of the procedure. The patient was transferred to the post catheterization recovery area for further monitoring.  Procedural Findings: Hemodynamics: AO 112/54 mean 77 mm Hg LV 118/15 mm Hg  Coronary angiography: Coronary dominance: right  Left mainstem: 20% proximal narrowing.   Left anterior descending  (LAD): The LAD is occluded proximally after the first septal perforator. It is heavily calcified.   There is a small ramus intermediate with a 95% stenosis in the proximal vessel.  Left circumflex (LCx): The LCx is a large vessel with complex disease in the proximal and mid vessel. There is moderate calcification. There is a 90-95% stenosis in the proximal vessel followed by a 90% stenosis in the mid vessel. Between these lesions there is an aneurysmal segment. The first OM has a 95% proximal stenosis and is then occluded in the mid vessel. The Second and third OM branches are normal. The distal LCx gives  a large epicardial collateral to the posterolateral branches of the RCA.  Right coronary artery (RCA): The RCA is heavily calcified. There is a 95% stenosis in the mid vessel. There is diffuse 80-90% disease from the crux into the distal vessel. The PDA is occluded proximally. The PLOM is occluded.  The SVG to the RV marginal branch of the RCA is widely patent.   There is a SVG sequentially to OM1 in the mid and distal vessel is widely patent. It gives collateral flow to the first diagonal.  The LIMA to the LAD is widely patent. There is a focal 90% stenosis in the distal native LAD.  Left ventriculography: Left ventricular systolic function is normal, LVEF is estimated at 60-65%, there is no significant mitral regurgitation   Final Conclusions:  1. Severe 3 vessel obstructive CAD 2. Patent LIMA to the LAD 3. Patent sequential SVG to the OM1 (mid and distal OM) 4. Patent SVG to the [facility] branch of the RCA 5. Normal LV function.  Recommendations: Will review study. Potential areas of ischemia include the distal LAD and the mid to distal LCx with OM2, OM3 and collaterals to the distal RCA. 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   Ilham Roughton Choi, [place]  04/14/2014, 10:33 AM  Ecg today shows NSR with rate 59. Otherwise normal. I have personally reviewed and  interpreted this study.  Assessment / Plan: 1. CAD- s/p CABG 2006. Grafts are patent. Will continue to manage medically - he is stable clinically - would continue on current regimen. I will follow up in 6 months.  2. Hyperlipidemia. On statin.  3. GERD -  Improved with  PPI therapy  4. DM type 2. Encourage further weight loss.   5. HTN controlled.  From a cardiac standpoint he is cleared for knee surgery.

## 2015-06-05 NOTE — Patient Instructions (Signed)
Continue your current therapy  I will see you in 6 months.   

## 2015-06-11 DIAGNOSIS — Z6828 Body mass index (BMI) 28.0-28.9, adult: Secondary | ICD-10-CM | POA: Diagnosis not present

## 2015-06-11 DIAGNOSIS — Z139 Encounter for screening, unspecified: Secondary | ICD-10-CM | POA: Diagnosis not present

## 2015-06-11 DIAGNOSIS — I25810 Atherosclerosis of coronary artery bypass graft(s) without angina pectoris: Secondary | ICD-10-CM | POA: Diagnosis not present

## 2015-06-11 DIAGNOSIS — M1711 Unilateral primary osteoarthritis, right knee: Secondary | ICD-10-CM | POA: Diagnosis not present

## 2015-06-11 DIAGNOSIS — D638 Anemia in other chronic diseases classified elsewhere: Secondary | ICD-10-CM | POA: Diagnosis not present

## 2015-06-11 DIAGNOSIS — E785 Hyperlipidemia, unspecified: Secondary | ICD-10-CM | POA: Diagnosis not present

## 2015-06-11 DIAGNOSIS — I10 Essential (primary) hypertension: Secondary | ICD-10-CM | POA: Diagnosis not present

## 2015-06-11 DIAGNOSIS — E1140 Type 2 diabetes mellitus with diabetic neuropathy, unspecified: Secondary | ICD-10-CM | POA: Diagnosis not present

## 2015-06-12 DIAGNOSIS — Z7984 Long term (current) use of oral hypoglycemic drugs: Secondary | ICD-10-CM | POA: Diagnosis not present

## 2015-06-12 DIAGNOSIS — Z961 Presence of intraocular lens: Secondary | ICD-10-CM | POA: Diagnosis not present

## 2015-06-12 DIAGNOSIS — H18413 Arcus senilis, bilateral: Secondary | ICD-10-CM | POA: Diagnosis not present

## 2015-06-12 DIAGNOSIS — H02403 Unspecified ptosis of bilateral eyelids: Secondary | ICD-10-CM | POA: Diagnosis not present

## 2015-06-12 DIAGNOSIS — H11153 Pinguecula, bilateral: Secondary | ICD-10-CM | POA: Diagnosis not present

## 2015-06-12 DIAGNOSIS — Z9849 Cataract extraction status, unspecified eye: Secondary | ICD-10-CM | POA: Diagnosis not present

## 2015-06-12 DIAGNOSIS — H04123 Dry eye syndrome of bilateral lacrimal glands: Secondary | ICD-10-CM | POA: Diagnosis not present

## 2015-06-12 DIAGNOSIS — E119 Type 2 diabetes mellitus without complications: Secondary | ICD-10-CM | POA: Diagnosis not present

## 2015-06-12 DIAGNOSIS — H353131 Nonexudative age-related macular degeneration, bilateral, early dry stage: Secondary | ICD-10-CM | POA: Diagnosis not present

## 2015-06-15 ENCOUNTER — Encounter: Payer: Self-pay | Admitting: Cardiology

## 2015-07-13 ENCOUNTER — Telehealth: Payer: Self-pay

## 2015-07-13 NOTE — Telephone Encounter (Signed)
Received a surgical clearance from [facility].Dr.Jordan cleared patient for surgery.Form faxed back to fax # [phone].

## 2015-07-27 ENCOUNTER — Ambulatory Visit: Payer: Medicare PPO | Admitting: Cardiology

## 2015-09-03 ENCOUNTER — Encounter: Payer: Self-pay | Admitting: Cardiology

## 2015-10-06 ENCOUNTER — Other Ambulatory Visit: Payer: Self-pay

## 2016-02-15 ENCOUNTER — Other Ambulatory Visit: Payer: Self-pay | Admitting: Cardiology

## 2016-02-15 NOTE — Telephone Encounter (Signed)
Rx request sent to pharmacy.  

## 2016-02-24 ENCOUNTER — Encounter (INDEPENDENT_AMBULATORY_CARE_PROVIDER_SITE_OTHER): Payer: Self-pay

## 2016-02-24 ENCOUNTER — Ambulatory Visit (INDEPENDENT_AMBULATORY_CARE_PROVIDER_SITE_OTHER): Payer: Medicare Other | Admitting: Sports Medicine

## 2016-02-24 ENCOUNTER — Encounter: Payer: Self-pay | Admitting: Sports Medicine

## 2016-02-24 DIAGNOSIS — M79671 Pain in right foot: Secondary | ICD-10-CM

## 2016-02-24 DIAGNOSIS — M79672 Pain in left foot: Secondary | ICD-10-CM

## 2016-02-24 DIAGNOSIS — M2142 Flat foot [pes planus] (acquired), left foot: Secondary | ICD-10-CM | POA: Diagnosis not present

## 2016-02-24 DIAGNOSIS — E1142 Type 2 diabetes mellitus with diabetic polyneuropathy: Secondary | ICD-10-CM

## 2016-02-24 DIAGNOSIS — M2141 Flat foot [pes planus] (acquired), right foot: Secondary | ICD-10-CM | POA: Diagnosis not present

## 2016-02-24 NOTE — Progress Notes (Addendum)
Subjective: Alexander Choi is a 82 y.o. male patient with history of diabetes who presents to office today complaining of pain at left arch and side of foot that feel sharp that comes and goes. Neuropathy on Gabapentin with relief but forgets to take it 3x per day. Reports history of flat feet and has arch supports which he got from [employer] store and wanted to know which should he use. Patient states that the glucose reading this morning was 90 mg/dl. Patient denies any new changes in medication or new problems.   Reports had AFO that he used for right foot; does not notice a difference with or without.  Patient Active Problem List   Diagnosis Date Noted  . Coronary artery disease   . Hypertension   . Hypercholesterolemia   . Diabetes mellitus ([place])    Current Outpatient Prescriptions on File Prior to Visit  Medication Sig Dispense Refill  . Acetaminophen (TYLENOL ARTHRITIS PAIN PO) Take 2 tablets by mouth daily.     Marland Kitchen aspirin 81 MG tablet Take 81 mg by mouth daily.      Marland Kitchen atorvastatin (LIPITOR) 40 MG tablet Take 40 mg by mouth daily.    Marland Kitchen FREESTYLE LITE test strip As directed    . gabapentin (NEURONTIN) 100 MG capsule Take 100 mg by mouth 3 (three) times daily.    Marland Kitchen glipiZIDE (GLUCOTROL) 5 MG tablet Take 5 mg by mouth daily before breakfast.     . metFORMIN (GLUCOPHAGE) 500 MG tablet Take 500 mg by mouth 2 (two) times daily with a meal.     . metoprolol succinate (TOPROL-XL) 25 MG 24 hr tablet Take 25 mg by mouth daily.      . nitroGLYCERIN (NITROSTAT) 0.4 MG SL tablet Place 1 tablet (0.4 mg total) under the tongue every 5 (five) minutes as needed for chest pain. 90 tablet 3  . pantoprazole (PROTONIX) 40 MG tablet TAKE ONE TABLET BY MOUTH ONCE DAILY 90 tablet 3  . valsartan-hydrochlorothiazide (DIOVAN-HCT) 320-25 MG tablet Take 1 tablet by mouth daily. Take 1 tab daily     No current facility-administered medications on file prior to visit.    Allergies  Allergen Reactions  . Ace  Inhibitors   . Nitrates, Organic   . Penicillins Rash    No results found for this or any previous visit (from the past 2160 hour(s)).  Objective: General: Patient is awake, alert, and oriented x 3 and in no acute distress.  Integument: Skin is warm, dry and supple bilateral. Nails are short, thickened and dystrophic with subungual debris, consistent with onychomycosis, 1-5 bilateral with dry blood underneath left 2nd toenail. No signs of infection. No open lesions or preulcerative lesions present bilateral. Remaining integument unremarkable.  Vasculature:  Dorsalis Pedis pulse 1/4 bilateral. Posterior Tibial pulse  1/4 bilateral. Capillary fill time <5 sec 1-5 bilateral. Scant hair growth to the level of the digits.Temperature gradient within normal limits. No varicosities present bilateral. No edema present bilateral.   Neurology: The patient has intact sensation measured with a 5.07/10g Semmes Weinstein Monofilament at all pedal sites bilateral . Vibratory sensation diminished bilateral with tuning fork. No Babinski sign present bilateral. Subjective sharp shooting ocassional pain left medial foot and arch, non-reproducible.   Musculoskeletal: Pes planus foot type bilateral. Muscular strength 5/5 in all lower extremity muscular groups bilateral without pain on range of motion. No tenderness with calf compression bilateral.  Assessment and Plan: Problem List Items Addressed This Visit    None  Visit Diagnoses    Pes planus of both feet    -  Primary   Arch pain of left foot       Foot pain, bilateral       Diabetic polyneuropathy associated with type 2 diabetes mellitus ([place])          -Examined patient. -Discussed and educated patient on diabetic foot care, especially with  regards to the vascular, neurological and musculoskeletal systems.  -Stressed the importance of good glycemic control and the detriment of not controlling glucose levels in relation to the foot. -Discussed  occasional pain which is likely secondary to neuropathy vs structural -Rx topical neuropathy pain cream from [facility] -Recommend patient to continue with good supportive shoes and custom orthotics/insert -Answered all patient questions -Patient to return as needed -Patient advised to call the office if any problems or questions arise in the meantime.  Landis Martins, DPM

## 2016-02-25 ENCOUNTER — Telehealth: Payer: Self-pay | Admitting: *Deleted

## 2016-02-25 MED ORDER — NONFORMULARY OR COMPOUNDED ITEM: 3 refills | Status: DC

## 2016-02-25 NOTE — Telephone Encounter (Addendum)
-----   Message from Landis Martins, [place] sent at 02/24/2016  2:37 PM EDT ----- Regarding: [facility] Neuropathy cream for left foot pain Thanks Dr Cannon Kettle. 02/25/2016-Orders faxed to [facility] [employer].

## 2016-03-09 ENCOUNTER — Encounter: Payer: Self-pay | Admitting: Cardiology

## 2016-03-23 NOTE — Progress Notes (Signed)
[name] Date of Birth: December 28, 1932 Medical Record [id number]  History of Present Illness: [name] is seen for follow up CAD. He has known CAD with prior CABG in 2006, negative Myoview in October 2015 showed inferior ischemia. Cardiac cath showed all grafts patent. There was severe disease in the native LCx that predated his CABG. Other issues include DM, HTN and HLD.   On follow up today he reports he is doing well. No dyspnea or chest pain.   His BS is doing better. He did not tolerate higher metformin doses due to GI side effects. He did undergo right TKR in April. He is walking 30 minutes a day. He does complain of peripheral neuropathy in his feet.  Current Outpatient Prescriptions  Medication Sig Dispense Refill  . Acetaminophen (TYLENOL ARTHRITIS PAIN PO) Take 2 tablets by mouth daily.     Marland Kitchen aspirin 81 MG tablet Take 81 mg by mouth daily.      Marland Kitchen atorvastatin (LIPITOR) 40 MG tablet Take 40 mg by mouth daily.    Marland Kitchen FREESTYLE LITE test strip As directed    . gabapentin (NEURONTIN) 100 MG capsule Take 100 mg by mouth 3 (three) times daily.    Marland Kitchen glimepiride (AMARYL) 1 MG tablet Take 1 mg by mouth daily with breakfast.    . metFORMIN (GLUCOPHAGE) 500 MG tablet Take 500 mg by mouth 2 (two) times daily with a meal.     . metoprolol succinate (TOPROL-XL) 25 MG 24 hr tablet Take 25 mg by mouth daily.      . nitroGLYCERIN (NITROSTAT) 0.4 MG SL tablet Place 1 tablet (0.4 mg total) under the tongue every 5 (five) minutes as needed for chest pain. 90 tablet 3  . NONFORMULARY OR COMPOUNDED ITEM [facility]:  Peripheral Neuropathy Cream - Bupivacaine 1%, doxepin 3%, Gabapentin 6%, Pentoxifylline 3%, Topiramate 1%, apply 1-2 grams to affected area 3-4 times daily. 120 each 3  . pantoprazole (PROTONIX) 40 MG tablet TAKE ONE TABLET BY MOUTH ONCE DAILY 90 tablet 3  . ranitidine (ZANTAC) 300 MG tablet Take 300 mg by mouth at bedtime.    . valsartan-hydrochlorothiazide (DIOVAN-HCT) 320-25 MG  tablet Take 1 tablet by mouth daily. Take 1 tab daily     No current facility-administered medications for this visit.     Allergies  Allergen Reactions  . Ace Inhibitors   . Nitrates, Organic   . Penicillins Rash    Past Medical History:  Diagnosis Date  . Cancer ([place])   . Chronic kidney disease    kidney stones  . Coronary artery disease    with CABG in 2006 with LIMA to LAD, SVG to mid and distal OM, SVG to AM. Normal Myoview in May of 2013  . Diabetes mellitus    Type 2  . GERD (gastroesophageal reflux disease)   . Hematuria   . Hypercholesterolemia   . Hypertension   . Irritable bowel syndrome   . Loose stools   . Prostate cancer ([place])   . Urinary obstruction     Past Surgical History:  Procedure Laterality Date  . CHOLECYSTECTOMY    . CORONARY ARTERY BYPASS GRAFT  2006  . LEFT HEART CATHETERIZATION WITH CORONARY/GRAFT ANGIOGRAM N/A 04/11/2014   Procedure: LEFT HEART CATHETERIZATION WITH [name];  Surgeon: Peter M [name], MD;  Location: [facility] CATH LAB;  Service: Cardiovascular;  Laterality: N/A;  . OTHER SURGICAL HISTORY     bowel obstruction  . RADIOACTIVE SEED IMPLANT  for prostate cancer  . SKIN CANCER EXCISION      History  Smoking Status  . Former Smoker  . Packs/day: 3.00  Smokeless Tobacco  . Never Used    History  Alcohol Use No    Family History  Problem Relation Age of Onset  . Cancer Mother     Leukemia  . Hypertension Mother   . Cancer Father     Colon  . Heart attack Father     Had Several in his 77's  . Hypertension Father   . Diabetes Father     Review of Systems: The review of systems is per the HPI.  All other systems were reviewed and are negative.  Physical Exam: BP (!) 150/66   Pulse (!) 47   Ht 5\' 10"  (1.778 m)   Wt 191 lb 3.2 oz (86.7 kg)   SpO2 99%   BMI 27.43 kg/m  Patient is very pleasant and in no acute distress.  Skin is warm and dry. Color is normal.  HEENT is unremarkable.  Normocephalic/atraumatic. PERRL. Sclera are nonicteric. Neck is supple. No masses. No JVD. Lungs are clear. Cardiac exam shows a regular rate and rhythm. Abdomen is soft. Extremities are without edema. Gait and ROM are intact. No gross neurologic deficits noted.  Wt Readings from Last 3 Encounters:  03/24/16 191 lb 3.2 oz (86.7 kg)  06/05/15 196 lb 8 oz (89.1 kg)  01/26/15 190 lb (86.2 kg)    LABORATORY DATA/PROCEDURES:  Lab Results  Component Value Date   WBC 6.1 04/08/2014   HGB 13.0 04/08/2014   HCT 37.7 (L) 04/08/2014   PLT 215 04/08/2014   GLUCOSE 142 (H) 04/08/2014   NA 138 04/08/2014   K 4.1 04/08/2014   CL 98 04/08/2014   CREATININE 0.75 04/08/2014   BUN 13 04/08/2014   CO2 31 04/08/2014   INR 0.96 04/08/2014    BNP (last 3 results) No results for input(s): PROBNP in the last 8760 hours.  Labs reviewed from 01/04/16: cholesterol 123, triglycerides 151, HDL 36, LDL 57. CBC and CMET normal.    Assessment / Plan: 1. CAD- s/p CABG 2006. Grafts are patent by cath in 2015. Will continue to manage medically - he is stable clinically - would continue on current regimen. I will follow up in 6 months.  2. Hyperlipidemia. On statin. Excellent control.  3. GERD  4. DM type 2. Encourage further weight loss.   5. HTN

## 2016-03-24 ENCOUNTER — Ambulatory Visit (INDEPENDENT_AMBULATORY_CARE_PROVIDER_SITE_OTHER): Payer: Medicare Other | Admitting: Cardiology

## 2016-03-24 ENCOUNTER — Encounter: Payer: Self-pay | Admitting: Cardiology

## 2016-03-24 VITALS — BP 150/66 | HR 47 | Ht 70.0 in | Wt 191.2 lb

## 2016-03-24 DIAGNOSIS — E7800 Pure hypercholesterolemia, unspecified: Secondary | ICD-10-CM

## 2016-03-24 DIAGNOSIS — I10 Essential (primary) hypertension: Secondary | ICD-10-CM | POA: Diagnosis not present

## 2016-03-24 DIAGNOSIS — I25810 Atherosclerosis of coronary artery bypass graft(s) without angina pectoris: Secondary | ICD-10-CM

## 2016-03-24 DIAGNOSIS — E119 Type 2 diabetes mellitus without complications: Secondary | ICD-10-CM | POA: Diagnosis not present

## 2016-03-24 NOTE — Patient Instructions (Signed)
Continue your current therapy  I will see you in 6 months.   

## 2016-04-07 ENCOUNTER — Encounter: Payer: Self-pay | Admitting: Cardiology

## 2016-04-20 DIAGNOSIS — A5211 Tabes dorsalis: Secondary | ICD-10-CM | POA: Insufficient documentation

## 2016-07-08 ENCOUNTER — Encounter: Payer: Self-pay | Admitting: Cardiology

## 2016-08-31 NOTE — Progress Notes (Signed)
Alexander Choi Date of Birth: 04-23-1933 Medical Record #[id number]  History of Present Illness: Alexander Choi is seen for follow up CAD. He has known CAD with prior CABG in 2006, negative Myoview in October 2015 showed inferior ischemia. Cardiac cath showed all grafts patent. There was severe disease in the native LCx that predated his CABG. Other issues include DM, HTN and HLD.   On follow up today he reports he is doing well. No dyspnea or chest pain.   His BS is doing better. He did not tolerate higher metformin doses due to GI side effects. He does have stress fracture in left foot requiring a boot. Walking is limited. His weight is up 7 lbs from inactivity.  Current Outpatient Prescriptions  Medication Sig Dispense Refill  . Acetaminophen (TYLENOL ARTHRITIS PAIN PO) Take 2 tablets by mouth daily.     Marland Kitchen aspirin 81 MG tablet Take 81 mg by mouth daily.      Marland Kitchen atorvastatin (LIPITOR) 40 MG tablet Take 40 mg by mouth daily.    Marland Kitchen FREESTYLE LITE test strip As directed    . gabapentin (NEURONTIN) 100 MG capsule Take 100 mg by mouth 3 (three) times daily.    Marland Kitchen glimepiride (AMARYL) 1 MG tablet Take 1 mg by mouth daily with breakfast.    . metFORMIN (GLUCOPHAGE) 500 MG tablet Take 500 mg by mouth 2 (two) times daily with a meal.     . metoprolol succinate (TOPROL-XL) 25 MG 24 hr tablet Take 25 mg by mouth daily.      . nitroGLYCERIN (NITROSTAT) 0.4 MG SL tablet Place 1 tablet (0.4 mg total) under the tongue every 5 (five) minutes as needed for chest pain. 90 tablet 3  . NONFORMULARY OR COMPOUNDED ITEM [facility]:  Peripheral Neuropathy Cream - Bupivacaine 1%, doxepin 3%, Gabapentin 6%, Pentoxifylline 3%, Topiramate 1%, apply 1-2 grams to affected area 3-4 times daily. 120 each 3  . pantoprazole (PROTONIX) 40 MG tablet TAKE ONE TABLET BY MOUTH ONCE DAILY 90 tablet 3  . ranitidine (ZANTAC) 300 MG tablet Take 300 mg by mouth at bedtime.    . valsartan-hydrochlorothiazide (DIOVAN-HCT) 320-25 MG  tablet Take 1 tablet by mouth daily. Take 1 tab daily     No current facility-administered medications for this visit.     Allergies  Allergen Reactions  . Ace Inhibitors   . Nitrates, Organic   . Penicillins Rash    Past Medical History:  Diagnosis Date  . Cancer ([place])   . Chronic kidney disease    kidney stones  . Coronary artery disease    with CABG in 2006 with LIMA to LAD, SVG to mid and distal OM, SVG to AM. Normal Myoview in May of 2013  . Diabetes mellitus    Type 2  . GERD (gastroesophageal reflux disease)   . Hematuria   . Hypercholesterolemia   . Hypertension   . Irritable bowel syndrome   . Loose stools   . Prostate cancer ([place])   . Urinary obstruction     Past Surgical History:  Procedure Laterality Date  . CHOLECYSTECTOMY    . CORONARY ARTERY BYPASS GRAFT  2006  . LEFT HEART CATHETERIZATION WITH CORONARY/GRAFT ANGIOGRAM N/A 04/11/2014   Procedure: LEFT HEART CATHETERIZATION WITH [name];  Surgeon: Perri Aragones M [name], MD;  Location: [facility] CATH LAB;  Service: Cardiovascular;  Laterality: N/A;  . OTHER SURGICAL HISTORY     bowel obstruction  . RADIOACTIVE SEED IMPLANT     for  prostate cancer  . SKIN CANCER EXCISION      History  Smoking Status  . Former Smoker  . Packs/day: 3.00  Smokeless Tobacco  . Never Used    History  Alcohol Use No    Family History  Problem Relation Age of Onset  . Cancer Mother     Leukemia  . Hypertension Mother   . Cancer Father     Colon  . Heart attack Father     Had Several in his 28's  . Hypertension Father   . Diabetes Father     Review of Systems: The review of systems is per the HPI.  All other systems were reviewed and are negative.  Physical Exam: There were no vitals taken for this visit. Patient is very pleasant and in no acute distress.  Skin is warm and dry. Color is normal.  HEENT is unremarkable. Normocephalic/atraumatic. PERRL. Sclera are nonicteric. Neck is supple. No masses. No  JVD. Lungs are clear. Cardiac exam shows a regular rate and rhythm. Abdomen is soft. Extremities are without edema. Gait and ROM are intact. No gross neurologic deficits noted.  Wt Readings from Last 3 Encounters:  03/24/16 191 lb 3.2 oz (86.7 kg)  06/05/15 196 lb 8 oz (89.1 kg)  01/26/15 190 lb (86.2 kg)    LABORATORY DATA/PROCEDURES:  Lab Results  Component Value Date   WBC 6.1 04/08/2014   HGB 13.0 04/08/2014   HCT 37.7 (L) 04/08/2014   PLT 215 04/08/2014   GLUCOSE 142 (H) 04/08/2014   NA 138 04/08/2014   K 4.1 04/08/2014   CL 98 04/08/2014   CREATININE 0.75 04/08/2014   BUN 13 04/08/2014   CO2 31 04/08/2014   INR 0.96 04/08/2014    BNP (last 3 results) No results for input(s): PROBNP in the last 8760 hours.  Labs reviewed from 01/04/16: cholesterol 123, triglycerides 151, HDL 36, LDL 57. CBC and CMET normal.  Dated 07/08/16: cholesterol 116, triglycerides 205, HDL 30, LDL 45. CMET and CBC normal.  Ecg today shows NSR with a normal Ecg. I have personally reviewed and interpreted this study.  Assessment / Plan: 1. CAD- s/p CABG 2006. Grafts are patent by cath in 2015. Will continue to manage medically - he is stable clinically - would continue on current regimen. I will follow up in 6 months.  2. Hyperlipidemia. On statin. Excellent control.  3. GERD  4. DM type 2. Per primary care  5. HTN

## 2016-09-02 ENCOUNTER — Ambulatory Visit (INDEPENDENT_AMBULATORY_CARE_PROVIDER_SITE_OTHER): Payer: Medicare Other | Admitting: Cardiology

## 2016-09-02 ENCOUNTER — Encounter: Payer: Self-pay | Admitting: Cardiology

## 2016-09-02 VITALS — BP 130/62 | HR 60 | Ht 70.0 in | Wt 198.0 lb

## 2016-09-02 DIAGNOSIS — I10 Essential (primary) hypertension: Secondary | ICD-10-CM | POA: Diagnosis not present

## 2016-09-02 DIAGNOSIS — E7800 Pure hypercholesterolemia, unspecified: Secondary | ICD-10-CM | POA: Diagnosis not present

## 2016-09-02 DIAGNOSIS — E119 Type 2 diabetes mellitus without complications: Secondary | ICD-10-CM

## 2016-09-02 DIAGNOSIS — I25810 Atherosclerosis of coronary artery bypass graft(s) without angina pectoris: Secondary | ICD-10-CM

## 2016-09-02 NOTE — Patient Instructions (Signed)
Continue your current therapy  I will see you in 6 months.   

## 2017-03-23 NOTE — Progress Notes (Signed)
Alexander Choi Date of Birth: [birth year] Medical Record #[id number]  History of Present Illness: Alexander Choi is seen for follow up CAD. He has known CAD with prior CABG in 2006, negative Myoview in October 2015 showed inferior ischemia. Cardiac cath showed all grafts patent. There was severe disease in the native LCx that predated his CABG. Other issues include DM, HTN and HLD.   On follow up today he reports he is doing well. No dyspnea or chest pain.   He notes his wife has been in poor health for the last 2 years with numerous falls. He is now acting as caretaker. He overall feels well.   Current Outpatient Prescriptions  Medication Sig Dispense Refill  . Acetaminophen (TYLENOL ARTHRITIS PAIN PO) Take 2 tablets by mouth daily.     Marland Kitchen aspirin 81 MG tablet Take 81 mg by mouth daily.      Marland Kitchen atorvastatin (LIPITOR) 40 MG tablet Take 40 mg by mouth daily.    Marland Kitchen FREESTYLE LITE test strip As directed    . gabapentin (NEURONTIN) 100 MG capsule Take 100 mg by mouth 3 (three) times daily.    Marland Kitchen glimepiride (AMARYL) 1 MG tablet Take 1 mg by mouth daily with breakfast.    . metFORMIN (GLUCOPHAGE) 500 MG tablet Take 500 mg by mouth 2 (two) times daily with a meal.     . metoprolol succinate (TOPROL-XL) 25 MG 24 hr tablet Take 25 mg by mouth daily.      . nitroGLYCERIN (NITROSTAT) 0.4 MG SL tablet Place 1 tablet (0.4 mg total) under the tongue every 5 (five) minutes as needed for chest pain. 90 tablet 3  . NONFORMULARY OR COMPOUNDED ITEM [facility]:  Peripheral Neuropathy Cream - Bupivacaine 1%, doxepin 3%, Gabapentin 6%, Pentoxifylline 3%, Topiramate 1%, apply 1-2 grams to affected area 3-4 times daily. 120 each 3  . pantoprazole (PROTONIX) 40 MG tablet TAKE ONE TABLET BY MOUTH ONCE DAILY 90 tablet 3  . ranitidine (ZANTAC) 300 MG tablet Take 300 mg by mouth at bedtime.     No current facility-administered medications for this visit.     Allergies  Allergen Reactions  . Ace Inhibitors   .  Nitrates, Organic   . Penicillins Rash    Past Medical History:  Diagnosis Date  . Cancer ([place])   . Chronic kidney disease    kidney stones  . Coronary artery disease    with CABG in 2006 with LIMA to LAD, SVG to mid and distal OM, SVG to AM. Normal Myoview in May of 2013  . Diabetes mellitus    Type 2  . GERD (gastroesophageal reflux disease)   . Hematuria   . Hypercholesterolemia   . Hypertension   . Irritable bowel syndrome   . Loose stools   . Prostate cancer ([place])   . Urinary obstruction     Past Surgical History:  Procedure Laterality Date  . CHOLECYSTECTOMY    . CORONARY ARTERY BYPASS GRAFT  2006  . LEFT HEART CATHETERIZATION WITH CORONARY/GRAFT ANGIOGRAM N/A 04/11/2014   Procedure: LEFT HEART CATHETERIZATION WITH [name];  Surgeon: Tarrance Januszewski M [name], MD;  Location: [facility] CATH LAB;  Service: Cardiovascular;  Laterality: N/A;  . OTHER SURGICAL HISTORY     bowel obstruction  . RADIOACTIVE SEED IMPLANT     for prostate cancer  . SKIN CANCER EXCISION      History  Smoking Status  . Former Smoker  . Packs/day: 3.00  Smokeless Tobacco  . Never  Used    History  Alcohol Use No    Family History  Problem Relation Age of Onset  . Cancer Mother        Leukemia  . Hypertension Mother   . Cancer Father        Colon  . Heart attack Father        Had Several in his 81's  . Hypertension Father   . Diabetes Father     Review of Systems: The review of systems is per the HPI.  All other systems were reviewed and are negative.  Physical Exam: BP 130/64   Pulse 62   Ht 5\' 9"  (1.753 m)   Wt 195 lb (88.5 kg)   BMI 28.80 kg/m  GENERAL:  Well appearing WM in NAD HEENT:  PERRL, EOMI, sclera are clear. Oropharynx is clear. NECK:  No jugular venous distention, carotid upstroke brisk and symmetric, no bruits, no thyromegaly or adenopathy LUNGS:  Clear to auscultation bilaterally CHEST:  Unremarkable HEART:  RRR,  PMI not displaced or sustained,S1 and  S2 within normal limits, no S3, no S4: no clicks, no rubs, no murmurs ABD:  Soft, nontender. BS +, no masses or bruits. No hepatomegaly, no splenomegaly EXT:  2 + pulses throughout, no edema, no cyanosis no clubbing SKIN:  Warm and dry.  No rashes NEURO:  Alert and oriented x 3. Cranial nerves II through XII intact. PSYCH:  Cognitively intact    Wt Readings from Last 3 Encounters:  03/27/17 195 lb (88.5 kg)  09/02/16 198 lb (89.8 kg)  03/24/16 191 lb 3.2 oz (86.7 kg)    LABORATORY DATA/PROCEDURES:  Lab Results  Component Value Date   WBC 6.1 04/08/2014   HGB 13.0 04/08/2014   HCT 37.7 (L) 04/08/2014   PLT 215 04/08/2014   GLUCOSE 142 (H) 04/08/2014   NA 138 04/08/2014   K 4.1 04/08/2014   CL 98 04/08/2014   CREATININE 0.75 04/08/2014   BUN 13 04/08/2014   CO2 31 04/08/2014   INR 0.96 04/08/2014    BNP (last 3 results) No results for input(s): PROBNP in the last 8760 hours.  Labs reviewed from 01/04/16: cholesterol 123, triglycerides 151, HDL 36, LDL 57. CBC and CMET normal.  Dated 07/08/16: cholesterol 116, triglycerides 205, HDL 30, LDL 45. CMET and CBC normal. Dated 01/18/17: Hgb 12.2. CMET normal. Cholesterol 105, triglycerides 152, HDL 28, LDL 47. A1c 6.3%.  Assessment / Plan: 1. CAD- s/p CABG 2006. He is asymptomatic.  Grafts are patent by cath in 2015. Will continue to manage medically - with ASA, Toprol, an statin.. I will follow up in 6 months.  2. Hyperlipidemia. On statin. Excellent control.  3. DM type 2. Controlled. Per primary care.  5. HTN - well controlled.

## 2017-03-27 ENCOUNTER — Ambulatory Visit (INDEPENDENT_AMBULATORY_CARE_PROVIDER_SITE_OTHER): Payer: Medicare Other | Admitting: Cardiology

## 2017-03-27 VITALS — BP 130/64 | HR 62 | Ht 69.0 in | Wt 195.0 lb

## 2017-03-27 DIAGNOSIS — I25810 Atherosclerosis of coronary artery bypass graft(s) without angina pectoris: Secondary | ICD-10-CM | POA: Diagnosis not present

## 2017-03-27 DIAGNOSIS — E7800 Pure hypercholesterolemia, unspecified: Secondary | ICD-10-CM | POA: Diagnosis not present

## 2017-03-27 DIAGNOSIS — I10 Essential (primary) hypertension: Secondary | ICD-10-CM | POA: Diagnosis not present

## 2017-03-27 NOTE — Patient Instructions (Signed)
Continue your current therapy  I will see you in 6 months.   

## 2017-09-08 ENCOUNTER — Encounter: Payer: Self-pay | Admitting: Cardiology

## 2017-09-16 NOTE — Progress Notes (Signed)
[name] Date of Birth: Mar 21, 1933 Medical Record #[id number]  History of Present Illness: [name] is seen for follow up CAD. He has known CAD with prior CABG in 2006, negative Myoview in October 2015 showed inferior ischemia. Cardiac cath showed all grafts patent. There was severe disease in the native LCx that predated his CABG. Other issues include DM, HTN and HLD.   On follow up today he reports he is doing well. No dyspnea or chest pain.   He states his wife was just diagnosed with a brain tumor. Work up is in process. As a result he hasn't exercised as much recently.  Sugars have increased a little.   Current Outpatient Medications  Medication Sig Dispense Refill  . Acetaminophen (TYLENOL ARTHRITIS PAIN PO) Take 2 tablets by mouth daily.     Marland Kitchen aspirin 81 MG tablet Take 81 mg by mouth daily.      Marland Kitchen atorvastatin (LIPITOR) 40 MG tablet Take 40 mg by mouth daily.    Marland Kitchen FREESTYLE LITE test strip As directed    . gabapentin (NEURONTIN) 100 MG capsule Take 100 mg by mouth 3 (three) times daily.    Marland Kitchen glimepiride (AMARYL) 1 MG tablet Take 1 mg by mouth daily with breakfast.    . metFORMIN (GLUCOPHAGE) 500 MG tablet Take 500 mg by mouth 2 (two) times daily with a meal.     . metoprolol succinate (TOPROL-XL) 25 MG 24 hr tablet Take 25 mg by mouth daily.      . nitroGLYCERIN (NITROSTAT) 0.4 MG SL tablet Place 1 tablet (0.4 mg total) under the tongue every 5 (five) minutes as needed for chest pain. 90 tablet 3  . NONFORMULARY OR COMPOUNDED ITEM [facility]:  Peripheral Neuropathy Cream - Bupivacaine 1%, doxepin 3%, Gabapentin 6%, Pentoxifylline 3%, Topiramate 1%, apply 1-2 grams to affected area 3-4 times daily. 120 each 3  . pantoprazole (PROTONIX) 40 MG tablet TAKE ONE TABLET BY MOUTH ONCE DAILY 90 tablet 3  . ranitidine (ZANTAC) 300 MG tablet Take 300 mg by mouth at bedtime.     No current facility-administered medications for this visit.     Allergies  Allergen Reactions  .  Ace Inhibitors   . Nitrates, Organic   . Penicillins Rash    Past Medical History:  Diagnosis Date  . Cancer ([place])   . Chronic kidney disease    kidney stones  . Coronary artery disease    with CABG in 2006 with LIMA to LAD, SVG to mid and distal OM, SVG to AM. Normal Myoview in May of 2013  . Diabetes mellitus    Type 2  . GERD (gastroesophageal reflux disease)   . Hematuria   . Hypercholesterolemia   . Hypertension   . Irritable bowel syndrome   . Loose stools   . Prostate cancer ([place])   . Urinary obstruction     Past Surgical History:  Procedure Laterality Date  . CHOLECYSTECTOMY    . CORONARY ARTERY BYPASS GRAFT  2006  . LEFT HEART CATHETERIZATION WITH CORONARY/GRAFT ANGIOGRAM N/A 04/11/2014   Procedure: LEFT HEART CATHETERIZATION WITH [name];  Surgeon: Miyoshi Ligas M [name], MD;  Location: [facility] CATH LAB;  Service: Cardiovascular;  Laterality: N/A;  . OTHER SURGICAL HISTORY     bowel obstruction  . RADIOACTIVE SEED IMPLANT     for prostate cancer  . SKIN CANCER EXCISION      Social History   Tobacco Use  Smoking Status Former Smoker  . Packs/day:  3.00  Smokeless Tobacco Never Used    Social History   Substance and Sexual Activity  Alcohol Use No    Family History  Problem Relation Age of Onset  . Cancer Mother        Leukemia  . Hypertension Mother   . Cancer Father        Colon  . Heart attack Father        Had Several in his 7's  . Hypertension Father   . Diabetes Father     Review of Systems: The review of systems is per the HPI.  All other systems were reviewed and are negative.  Physical Exam: BP (!) 144/68   Pulse (!) 54   Ht 5\' 10"  (1.778 m)   Wt 197 lb (89.4 kg)   BMI 28.27 kg/m  GENERAL:  Well appearing WM in NAD HEENT:  PERRL, EOMI, sclera are clear. Oropharynx is clear. NECK:  No jugular venous distention, carotid upstroke brisk and symmetric, no bruits, no thyromegaly or adenopathy LUNGS:  Clear to auscultation  bilaterally CHEST:  Unremarkable HEART:  RRR,  PMI not displaced or sustained,S1 and S2 within normal limits, no S3, no S4: no clicks, no rubs, no murmurs ABD:  Soft, nontender. BS +, no masses or bruits. No hepatomegaly, no splenomegaly EXT:  2 + pulses throughout, no edema, no cyanosis no clubbing SKIN:  Warm and dry.  No rashes NEURO:  Alert and oriented x 3. Cranial nerves II through XII intact. PSYCH:  Cognitively intact      Wt Readings from Last 3 Encounters:  09/18/17 197 lb (89.4 kg)  03/27/17 195 lb (88.5 kg)  09/02/16 198 lb (89.8 kg)    LABORATORY DATA/PROCEDURES:  Lab Results  Component Value Date   WBC 6.1 04/08/2014   HGB 13.0 04/08/2014   HCT 37.7 (L) 04/08/2014   PLT 215 04/08/2014   GLUCOSE 142 (H) 04/08/2014   NA 138 04/08/2014   K 4.1 04/08/2014   CL 98 04/08/2014   CREATININE 0.75 04/08/2014   BUN 13 04/08/2014   CO2 31 04/08/2014   INR 0.96 04/08/2014    BNP (last 3 results) No results for input(s): PROBNP in the last 8760 hours.  Labs reviewed from 01/04/16: cholesterol 123, triglycerides 151, HDL 36, LDL 57. CBC and CMET normal.  Dated 07/08/16: cholesterol 116, triglycerides 205, HDL 30, LDL 45. CMET and CBC normal. Dated 01/18/17: Hgb 12.2. CMET normal. Cholesterol 105, triglycerides 152, HDL 28, LDL 47. A1c 6.3%. Dated 07/25/17: Hgb 12.5. Glucose 124. Other chemistries normal. Cholesterol 107, Triglycerides 139, HDL 29, LDL 50. A1c 7.2%. Iron studies normal.   Ecg today shows NSR with rate 54. First degree AV block. I have personally reviewed and interpreted this study.  Assessment / Plan: 1. CAD- s/p CABG 2006. He is asymptomatic.  Grafts are patent by cath in 2015. Will continue to manage medically - with ASA, Toprol, an statin.  2. Hyperlipidemia. On statin. Excellent control.  3. DM type 2. A1c is up some. Going to work on exercise and weight control.  4. HTN - well controlled.

## 2017-09-18 ENCOUNTER — Encounter: Payer: Self-pay | Admitting: Cardiology

## 2017-09-18 ENCOUNTER — Ambulatory Visit: Payer: Medicare Other | Admitting: Cardiology

## 2017-09-18 VITALS — BP 144/68 | HR 54 | Ht 70.0 in | Wt 197.0 lb

## 2017-09-18 DIAGNOSIS — I10 Essential (primary) hypertension: Secondary | ICD-10-CM | POA: Diagnosis not present

## 2017-09-18 DIAGNOSIS — E7800 Pure hypercholesterolemia, unspecified: Secondary | ICD-10-CM | POA: Diagnosis not present

## 2017-09-18 DIAGNOSIS — I25810 Atherosclerosis of coronary artery bypass graft(s) without angina pectoris: Secondary | ICD-10-CM

## 2017-09-18 NOTE — Addendum Note (Signed)
Addended by: Kathyrn Lass on: 09/18/2017 09:37 AM   Modules accepted: Orders

## 2018-03-02 NOTE — Progress Notes (Signed)
Alexander Choi Date of Birth: 1933/05/16 Medical Record #[id number]  History of Present Illness: Alexander Choi is seen for follow up CAD. He has known CAD with prior CABG in 2006, negative Myoview in October 2015 showed inferior ischemia. Cardiac cath showed all grafts patent. There was severe disease in the native LCx that predated his CABG. Other issues include DM, HTN and HLD.   On follow up today he is grieving over the passing of his wife this past Friday. She was ill with brain cancer. He denies any dyspnea or chest pain.   He states he has been eating on the go and has gained 7 lbs this year. He is planning to walk more now and eat better. Sugars have been higher. He notes some cramping at rest in his right hand and thigh.  Current Outpatient Medications  Medication Sig Dispense Refill  . Acetaminophen (TYLENOL ARTHRITIS PAIN PO) Take 2 tablets by mouth daily.     Marland Kitchen aspirin 81 MG tablet Take 81 mg by mouth daily.      Marland Kitchen atorvastatin (LIPITOR) 40 MG tablet Take 40 mg by mouth daily.    Marland Kitchen FREESTYLE LITE test strip As directed    . gabapentin (NEURONTIN) 100 MG capsule Take 100 mg by mouth 3 (three) times daily.    Marland Kitchen glimepiride (AMARYL) 1 MG tablet Take 1 mg by mouth daily with breakfast.    . metFORMIN (GLUCOPHAGE) 500 MG tablet Take 500 mg by mouth 2 (two) times daily with a meal.     . metoprolol succinate (TOPROL-XL) 25 MG 24 hr tablet Take 25 mg by mouth daily.      . nitroGLYCERIN (NITROSTAT) 0.4 MG SL tablet Place 1 tablet (0.4 mg total) under the tongue every 5 (five) minutes as needed for chest pain. 90 tablet 3  . NONFORMULARY OR COMPOUNDED ITEM [facility]:  Peripheral Neuropathy Cream - Bupivacaine 1%, doxepin 3%, Gabapentin 6%, Pentoxifylline 3%, Topiramate 1%, apply 1-2 grams to affected area 3-4 times daily. 120 each 3  . pantoprazole (PROTONIX) 40 MG tablet TAKE ONE TABLET BY MOUTH ONCE DAILY 90 tablet 3  . ranitidine (ZANTAC) 300 MG tablet Take 300 mg by mouth at  bedtime.     No current facility-administered medications for this visit.     Allergies  Allergen Reactions  . Ace Inhibitors   . Nitrates, Organic   . Penicillins Rash    Past Medical History:  Diagnosis Date  . Cancer ([place])   . Chronic kidney disease    kidney stones  . Coronary artery disease    with CABG in 2006 with LIMA to LAD, SVG to mid and distal OM, SVG to AM. Normal Myoview in May of 2013  . Diabetes mellitus    Type 2  . GERD (gastroesophageal reflux disease)   . Hematuria   . Hypercholesterolemia   . Hypertension   . Irritable bowel syndrome   . Loose stools   . Prostate cancer ([place])   . Urinary obstruction     Past Surgical History:  Procedure Laterality Date  . CHOLECYSTECTOMY    . CORONARY ARTERY BYPASS GRAFT  2006  . LEFT HEART CATHETERIZATION WITH CORONARY/GRAFT ANGIOGRAM N/A 04/11/2014   Procedure: LEFT HEART CATHETERIZATION WITH Alexander Choi;  Surgeon: Alexander Trombetta M [name], MD;  Location: [facility] CATH LAB;  Service: Cardiovascular;  Laterality: N/A;  . OTHER SURGICAL HISTORY     bowel obstruction  . RADIOACTIVE SEED IMPLANT     for prostate cancer  .  SKIN CANCER EXCISION      Social History   Tobacco Use  Smoking Status Former Smoker  . Packs/day: 3.00  Smokeless Tobacco Never Used    Social History   Substance and Sexual Activity  Alcohol Use No    Family History  Problem Relation Age of Onset  . Cancer Mother        Leukemia  . Hypertension Mother   . Cancer Father        Colon  . Heart attack Father        Had Several in his 44's  . Hypertension Father   . Diabetes Father     Review of Systems: The review of systems is per the HPI.  All other systems were reviewed and are negative.  Physical Exam: BP (!) 153/64   Pulse 63   Ht 5\' 10"  (1.778 m)   Wt 202 lb 9.6 oz (91.9 kg)   BMI 29.07 kg/m  GENERAL:  Well appearing elderly WM HEENT:  PERRL, EOMI, sclera are clear. Oropharynx is clear. NECK:  No jugular venous  distention, carotid upstroke brisk and symmetric, no bruits, no thyromegaly or adenopathy LUNGS:  Clear to auscultation bilaterally CHEST:  Unremarkable HEART:  RRR,  PMI not displaced or sustained,S1 and S2 within normal limits, no S3, no S4: no clicks, no rubs, no murmurs ABD:  Soft, nontender. BS +, no masses or bruits. No hepatomegaly, no splenomegaly EXT:  2 + pulses throughout, no edema, no cyanosis no clubbing SKIN:  Warm and dry.  No rashes NEURO:  Alert and oriented x 3. Cranial nerves II through XII intact. PSYCH:  Cognitively intact   Wt Readings from Last 3 Encounters:  03/05/18 202 lb 9.6 oz (91.9 kg)  09/18/17 197 lb (89.4 kg)  03/27/17 195 lb (88.5 kg)    LABORATORY DATA/PROCEDURES:  Lab Results  Component Value Date   WBC 6.1 04/08/2014   HGB 13.0 04/08/2014   HCT 37.7 (L) 04/08/2014   PLT 215 04/08/2014   GLUCOSE 142 (H) 04/08/2014   NA 138 04/08/2014   K 4.1 04/08/2014   CL 98 04/08/2014   CREATININE 0.75 04/08/2014   BUN 13 04/08/2014   CO2 31 04/08/2014   INR 0.96 04/08/2014    BNP (last 3 results) No results for input(s): PROBNP in the last 8760 hours.  Labs reviewed from 01/04/16: cholesterol 123, triglycerides 151, HDL 36, LDL 57. CBC and CMET normal.  Dated 07/08/16: cholesterol 116, triglycerides 205, HDL 30, LDL 45. CMET and CBC normal. Dated 01/18/17: Hgb 12.2. CMET normal. Cholesterol 105, triglycerides 152, HDL 28, LDL 47. A1c 6.3%. Dated 07/25/17: Hgb 12.5. Glucose 124. Other chemistries normal. Cholesterol 107, Triglycerides 139, HDL 29, LDL 50. A1c 7.2%. Iron studies normal.  Dated 5.1.19: cholesterol 107, triglycerides 109, HDL 29, LDL 56. A1c 7.5%. CBC and chemistries normal.    Assessment / Plan: 1. CAD- s/p CABG 2006. He is asymptomatic.  Grafts are patent by cath in 2015. Will continue to manage medically - with ASA, Toprol, an statin.  2. Hyperlipidemia. On statin. Excellent control.  3. DM type 2. A1c and sugars are up some. For now  will focus on eating better and aerobic  Exercise.  4. HTN - well controlled.   Follow up in 6 months.

## 2018-03-05 ENCOUNTER — Encounter: Payer: Self-pay | Admitting: Cardiology

## 2018-03-05 ENCOUNTER — Ambulatory Visit: Payer: Medicare Other | Admitting: Cardiology

## 2018-03-05 VITALS — BP 153/64 | HR 63 | Ht 70.0 in | Wt 202.6 lb

## 2018-03-05 DIAGNOSIS — E119 Type 2 diabetes mellitus without complications: Secondary | ICD-10-CM

## 2018-03-05 DIAGNOSIS — I25810 Atherosclerosis of coronary artery bypass graft(s) without angina pectoris: Secondary | ICD-10-CM

## 2018-03-05 DIAGNOSIS — I10 Essential (primary) hypertension: Secondary | ICD-10-CM

## 2018-03-05 DIAGNOSIS — E7800 Pure hypercholesterolemia, unspecified: Secondary | ICD-10-CM

## 2018-03-05 NOTE — Patient Instructions (Signed)
Continue your current therapy  I will see you in 6 months.   

## 2018-09-13 ENCOUNTER — Telehealth: Payer: Self-pay

## 2018-09-14 NOTE — Telephone Encounter (Signed)
Follow  Up ° ° ° ° °Pt is returning call ° ° ° °Please call back  °

## 2018-09-17 ENCOUNTER — Ambulatory Visit: Payer: Medicare Other | Admitting: Cardiology

## 2018-09-17 NOTE — Telephone Encounter (Signed)
   Primary Cardiologist:  Dr.Jordan   Patient contacted.  History reviewed.  No symptoms to suggest any unstable cardiac conditions.  Based on discussion, with current pandemic situation, we will be postponing this 09/17/27 appointment for Alexander Choi.  If symptoms change, he has been instructed to contact our office.   Appointment rescheduled to 11/27/18 at 2:40 pm.   Kathyrn Lass, LPN  [id number] 3:81 AM         .

## 2018-09-17 NOTE — Telephone Encounter (Signed)
PT HAS RESCHEDULED [name] APPT TO 11-27-2018

## 2018-11-22 ENCOUNTER — Telehealth: Payer: Self-pay | Admitting: Cardiology

## 2018-11-22 NOTE — Telephone Encounter (Signed)
LVM for patient to call regarding video or phone visit with Dr. [name].

## 2018-11-27 ENCOUNTER — Telehealth: Payer: Medicare Other | Admitting: Cardiology

## 2019-01-21 DIAGNOSIS — L84 Corns and callosities: Secondary | ICD-10-CM | POA: Insufficient documentation

## 2019-04-18 DIAGNOSIS — M25512 Pain in left shoulder: Secondary | ICD-10-CM

## 2019-04-29 ENCOUNTER — Ambulatory Visit: Payer: Medicare Other | Admitting: Cardiology

## 2019-06-13 DIAGNOSIS — Z01818 Encounter for other preprocedural examination: Secondary | ICD-10-CM

## 2019-07-01 ENCOUNTER — Telehealth: Payer: Medicare Other | Admitting: Cardiology

## 2019-07-01 ENCOUNTER — Ambulatory Visit: Payer: Medicare Other | Admitting: Cardiology

## 2019-07-01 DIAGNOSIS — S46812D Strain of other muscles, fascia and tendons at shoulder and upper arm level, left arm, subsequent encounter: Secondary | ICD-10-CM | POA: Diagnosis not present

## 2019-07-01 DIAGNOSIS — M25512 Pain in left shoulder: Secondary | ICD-10-CM | POA: Diagnosis not present

## 2019-07-03 DIAGNOSIS — M25512 Pain in left shoulder: Secondary | ICD-10-CM | POA: Diagnosis not present

## 2019-07-03 DIAGNOSIS — S46812D Strain of other muscles, fascia and tendons at shoulder and upper arm level, left arm, subsequent encounter: Secondary | ICD-10-CM | POA: Diagnosis not present

## 2019-07-08 DIAGNOSIS — S46812D Strain of other muscles, fascia and tendons at shoulder and upper arm level, left arm, subsequent encounter: Secondary | ICD-10-CM | POA: Diagnosis not present

## 2019-07-08 DIAGNOSIS — M25512 Pain in left shoulder: Secondary | ICD-10-CM | POA: Diagnosis not present

## 2019-07-09 NOTE — Progress Notes (Addendum)
Alexander Choi Date of Birth: 1933-03-02 Medical Record [id number]  History of Present Illness: Alexander Choi is seen for follow up CAD. He has known CAD with prior CABG in Sep 30, 2004, negative Myoview in October 2015 showed inferior ischemia. Cardiac cath showed all grafts patent. There was severe disease in the native LCx that predated his CABG. Other issues include DM, HTN and HLD.   On follow up today he is doing well from a cardiac standpoint.  He denies any dyspnea or chest pain. He is taking Tagamet for some heartburn. His wife passed away in 09/30/2017. He was admitted to [facility] in October with bowel obstruction. He had surgery with lysis of adhesions. Was in hospital for 16 days. One week later he fell in his garage and reinjured his shoulder. Had shoulder surgery in December.   Current Outpatient Medications  Medication Sig Dispense Refill  . Acetaminophen (TYLENOL ARTHRITIS PAIN PO) Take 2 tablets by mouth daily.     Marland Kitchen aspirin 81 MG tablet Take 81 mg by mouth daily.      Marland Kitchen atorvastatin (LIPITOR) 40 MG tablet Take 40 mg by mouth daily.    . cimetidine (TAGAMET) 400 MG tablet Take 400 mg by mouth 2 (two) times daily.    Marland Kitchen dicyclomine (BENTYL) 10 MG capsule     . FREESTYLE LITE test strip As directed    . gabapentin (NEURONTIN) 100 MG capsule Take 100 mg by mouth 3 (three) times daily.    Marland Kitchen glimepiride (AMARYL) 1 MG tablet Take 1 mg by mouth daily with breakfast. 1/2 in the morning 1/2 at night    . hydrochlorothiazide (HYDRODIURIL) 25 MG tablet TAKE 1 TABLET BY MOUTH ONCE DAILY    . Hyoscyamine Sulfate SL 0.125 MG SUBL DISSOLVE 1 TABLET IN MOUTH EVERY 4 TO 6 HOURS AS NEEDED FOR DIARRHEA    . losartan (COZAAR) 100 MG tablet TAKE 1 TABLET BY MOUTH ONCE DAILY    . metFORMIN (GLUCOPHAGE) 500 MG tablet Take 500 mg by mouth 2 (two) times daily with a meal.     . metoprolol succinate (TOPROL-XL) 25 MG 24 hr tablet Take 25 mg by mouth daily.      . nitroGLYCERIN (NITROSTAT) 0.4 MG SL tablet  Place 1 tablet (0.4 mg total) under the tongue every 5 (five) minutes as needed for chest pain. 90 tablet 3  . NONFORMULARY OR COMPOUNDED ITEM [facility]:  Peripheral Neuropathy Cream - Bupivacaine 1%, doxepin 3%, Gabapentin 6%, Pentoxifylline 3%, Topiramate 1%, apply 1-2 grams to affected area 3-4 times daily. 120 each 3  . pantoprazole (PROTONIX) 40 MG tablet TAKE ONE TABLET BY MOUTH ONCE DAILY 90 tablet 3  . valsartan-hydrochlorothiazide (DIOVAN-HCT) 320-25 MG tablet Take by mouth.     No current facility-administered medications for this visit.    Allergies  Allergen Reactions  . Ace Inhibitors   . Nitrates, Organic   . Penicillins Rash    Past Medical History:  Diagnosis Date  . Cancer ([place])   . Chronic kidney disease    kidney stones  . Coronary artery disease    with CABG in 09-30-2004 with LIMA to LAD, SVG to mid and distal OM, SVG to AM. Normal Myoview in May of 2013  . Diabetes mellitus    Type 2  . GERD (gastroesophageal reflux disease)   . Hematuria   . Hypercholesterolemia   . Hypertension   . Irritable bowel syndrome   . Loose stools   . Prostate cancer ([place])   .  Urinary obstruction     Past Surgical History:  Procedure Laterality Date  . CHOLECYSTECTOMY    . CORONARY ARTERY BYPASS GRAFT  2006  . LEFT HEART CATHETERIZATION WITH CORONARY/GRAFT ANGIOGRAM N/A 04/11/2014   Procedure: LEFT HEART CATHETERIZATION WITH [name];  Surgeon: Lyndsay Talamante M [name], MD;  Location: [facility] CATH LAB;  Service: Cardiovascular;  Laterality: N/A;  . OTHER SURGICAL HISTORY     bowel obstruction  . RADIOACTIVE SEED IMPLANT     for prostate cancer  . SKIN CANCER EXCISION      Social History   Tobacco Use  Smoking Status Former Smoker  . Packs/day: 3.00  Smokeless Tobacco Never Used    Social History   Substance and Sexual Activity  Alcohol Use No    Family History  Problem Relation Age of Onset  . Cancer Mother        Leukemia  . Hypertension Mother   .  Cancer Father        Colon  . Heart attack Father        Had Several in his 83's  . Hypertension Father   . Diabetes Father     Review of Systems: The review of systems is per the HPI.  All other systems were reviewed and are negative.  Physical Exam: BP 129/66   Pulse 73   Temp (!) 97 F (36.1 C)   Ht 5\' 10"  (1.778 m)   Wt 199 lb 12.8 oz (90.6 kg)   SpO2 98%   BMI 28.67 kg/m  GENERAL:  Well appearing elderly WM HEENT:  PERRL, EOMI, sclera are clear. Oropharynx is clear. NECK:  No jugular venous distention, carotid upstroke brisk and symmetric, no bruits, no thyromegaly or adenopathy LUNGS:  Clear to auscultation bilaterally CHEST:  Unremarkable HEART:  RRR,  PMI not displaced or sustained,S1 and S2 within normal limits, no S3, no S4: no clicks, no rubs, no murmurs ABD:  Soft, nontender. BS +, no masses or bruits. No hepatomegaly, no splenomegaly EXT:  2 + pulses throughout, no edema, no cyanosis no clubbing. Brace on left ankle.  SKIN:  Warm and dry.  No rashes NEURO:  Alert and oriented x 3. Cranial nerves II through XII intact. PSYCH:  Cognitively intact   Wt Readings from Last 3 Encounters:  07/16/19 199 lb 12.8 oz (90.6 kg)  03/05/18 202 lb 9.6 oz (91.9 kg)  09/18/17 197 lb (89.4 kg)    LABORATORY DATA/PROCEDURES:  Lab Results  Component Value Date   WBC 6.1 04/08/2014   HGB 13.0 04/08/2014   HCT 37.7 (L) 04/08/2014   PLT 215 04/08/2014   GLUCOSE 142 (H) 04/08/2014   NA 138 04/08/2014   K 4.1 04/08/2014   CL 98 04/08/2014   CREATININE 0.75 04/08/2014   BUN 13 04/08/2014   CO2 31 04/08/2014   INR 0.96 04/08/2014    BNP (last 3 results) No results for input(s): PROBNP in the last 8760 hours.  Labs reviewed from 01/04/16: cholesterol 123, triglycerides 151, HDL 36, LDL 57. CBC and CMET normal.  Dated 07/08/16: cholesterol 116, triglycerides 205, HDL 30, LDL 45. CMET and CBC normal. Dated 01/18/17: Hgb 12.2. CMET normal. Cholesterol 105, triglycerides 152,  HDL 28, LDL 47. A1c 6.3%. Dated 07/25/17: Hgb 12.5. Glucose 124. Other chemistries normal. Cholesterol 107, Triglycerides 139, HDL 29, LDL 50. A1c 7.2%. Iron studies normal.  Dated 5.1.19: cholesterol 107, triglycerides 109, HDL 29, LDL 56. A1c 7.5%. CBC and chemistries normal.  Dated 05/29/19: cholesterol 142,  triglycerides 207, HDL 33, LDL 54. A1c 6.9%. CBC and CMET normal.  Ecg today shows NSR with normal Ecg. Rate 66. I have personally reviewed and interpreted this study.  Assessment / Plan: 1. CAD- s/p CABG 2006. He is asymptomatic.  Grafts are patent by cath in 2015. Will continue to manage medically - with ASA, Toprol, an statin.  2. Hyperlipidemia. On statin. Excellent control.  3. DM type 2. A1c is improved.   4. HTN - well controlled.   5. Bowel obstruction s/p surgery.   Follow up in one year

## 2019-07-10 DIAGNOSIS — S46812D Strain of other muscles, fascia and tendons at shoulder and upper arm level, left arm, subsequent encounter: Secondary | ICD-10-CM | POA: Diagnosis not present

## 2019-07-10 DIAGNOSIS — M25512 Pain in left shoulder: Secondary | ICD-10-CM | POA: Diagnosis not present

## 2019-07-15 DIAGNOSIS — S46812D Strain of other muscles, fascia and tendons at shoulder and upper arm level, left arm, subsequent encounter: Secondary | ICD-10-CM | POA: Diagnosis not present

## 2019-07-15 DIAGNOSIS — M25512 Pain in left shoulder: Secondary | ICD-10-CM | POA: Diagnosis not present

## 2019-07-16 ENCOUNTER — Encounter: Payer: Self-pay | Admitting: Cardiology

## 2019-07-16 ENCOUNTER — Encounter (INDEPENDENT_AMBULATORY_CARE_PROVIDER_SITE_OTHER): Payer: Self-pay

## 2019-07-16 ENCOUNTER — Other Ambulatory Visit: Payer: Self-pay

## 2019-07-16 ENCOUNTER — Ambulatory Visit: Payer: Medicare PPO | Admitting: Cardiology

## 2019-07-16 VITALS — BP 129/66 | HR 73 | Temp 97.0°F | Ht 70.0 in | Wt 199.8 lb

## 2019-07-16 DIAGNOSIS — I25810 Atherosclerosis of coronary artery bypass graft(s) without angina pectoris: Secondary | ICD-10-CM | POA: Diagnosis not present

## 2019-07-16 DIAGNOSIS — I10 Essential (primary) hypertension: Secondary | ICD-10-CM

## 2019-07-16 DIAGNOSIS — E7800 Pure hypercholesterolemia, unspecified: Secondary | ICD-10-CM

## 2019-07-16 DIAGNOSIS — E119 Type 2 diabetes mellitus without complications: Secondary | ICD-10-CM | POA: Diagnosis not present

## 2019-07-17 DIAGNOSIS — M25512 Pain in left shoulder: Secondary | ICD-10-CM | POA: Diagnosis not present

## 2019-07-17 DIAGNOSIS — S46812D Strain of other muscles, fascia and tendons at shoulder and upper arm level, left arm, subsequent encounter: Secondary | ICD-10-CM | POA: Diagnosis not present

## 2019-07-23 DIAGNOSIS — S46812D Strain of other muscles, fascia and tendons at shoulder and upper arm level, left arm, subsequent encounter: Secondary | ICD-10-CM | POA: Diagnosis not present

## 2019-07-23 DIAGNOSIS — M25512 Pain in left shoulder: Secondary | ICD-10-CM | POA: Diagnosis not present

## 2019-07-25 DIAGNOSIS — S46812D Strain of other muscles, fascia and tendons at shoulder and upper arm level, left arm, subsequent encounter: Secondary | ICD-10-CM | POA: Diagnosis not present

## 2019-07-25 DIAGNOSIS — M25512 Pain in left shoulder: Secondary | ICD-10-CM | POA: Diagnosis not present

## 2019-07-30 DIAGNOSIS — D3132 Benign neoplasm of left choroid: Secondary | ICD-10-CM | POA: Diagnosis not present

## 2019-07-30 DIAGNOSIS — H40013 Open angle with borderline findings, low risk, bilateral: Secondary | ICD-10-CM | POA: Diagnosis not present

## 2019-07-30 DIAGNOSIS — H18413 Arcus senilis, bilateral: Secondary | ICD-10-CM | POA: Diagnosis not present

## 2019-07-30 DIAGNOSIS — E119 Type 2 diabetes mellitus without complications: Secondary | ICD-10-CM | POA: Diagnosis not present

## 2019-07-30 DIAGNOSIS — H11153 Pinguecula, bilateral: Secondary | ICD-10-CM | POA: Diagnosis not present

## 2019-07-30 DIAGNOSIS — H353131 Nonexudative age-related macular degeneration, bilateral, early dry stage: Secondary | ICD-10-CM | POA: Diagnosis not present

## 2019-07-30 DIAGNOSIS — Z794 Long term (current) use of insulin: Secondary | ICD-10-CM | POA: Diagnosis not present

## 2019-07-30 DIAGNOSIS — H52223 Regular astigmatism, bilateral: Secondary | ICD-10-CM | POA: Diagnosis not present

## 2019-07-30 DIAGNOSIS — H04123 Dry eye syndrome of bilateral lacrimal glands: Secondary | ICD-10-CM | POA: Diagnosis not present

## 2019-07-31 DIAGNOSIS — S46812D Strain of other muscles, fascia and tendons at shoulder and upper arm level, left arm, subsequent encounter: Secondary | ICD-10-CM | POA: Diagnosis not present

## 2019-07-31 DIAGNOSIS — M25512 Pain in left shoulder: Secondary | ICD-10-CM | POA: Diagnosis not present

## 2019-08-01 DIAGNOSIS — M25512 Pain in left shoulder: Secondary | ICD-10-CM | POA: Diagnosis not present

## 2019-08-01 DIAGNOSIS — S46812D Strain of other muscles, fascia and tendons at shoulder and upper arm level, left arm, subsequent encounter: Secondary | ICD-10-CM | POA: Diagnosis not present

## 2019-08-05 DIAGNOSIS — S46812D Strain of other muscles, fascia and tendons at shoulder and upper arm level, left arm, subsequent encounter: Secondary | ICD-10-CM | POA: Diagnosis not present

## 2019-08-05 DIAGNOSIS — M25512 Pain in left shoulder: Secondary | ICD-10-CM | POA: Diagnosis not present

## 2019-08-07 DIAGNOSIS — M25512 Pain in left shoulder: Secondary | ICD-10-CM | POA: Diagnosis not present

## 2019-08-07 DIAGNOSIS — S46812D Strain of other muscles, fascia and tendons at shoulder and upper arm level, left arm, subsequent encounter: Secondary | ICD-10-CM | POA: Diagnosis not present

## 2019-08-12 DIAGNOSIS — S46812D Strain of other muscles, fascia and tendons at shoulder and upper arm level, left arm, subsequent encounter: Secondary | ICD-10-CM | POA: Diagnosis not present

## 2019-08-12 DIAGNOSIS — M25512 Pain in left shoulder: Secondary | ICD-10-CM | POA: Diagnosis not present

## 2019-08-14 DIAGNOSIS — S46812D Strain of other muscles, fascia and tendons at shoulder and upper arm level, left arm, subsequent encounter: Secondary | ICD-10-CM | POA: Diagnosis not present

## 2019-08-14 DIAGNOSIS — M25512 Pain in left shoulder: Secondary | ICD-10-CM | POA: Diagnosis not present

## 2019-08-19 DIAGNOSIS — M25512 Pain in left shoulder: Secondary | ICD-10-CM | POA: Diagnosis not present

## 2019-08-19 DIAGNOSIS — S46812D Strain of other muscles, fascia and tendons at shoulder and upper arm level, left arm, subsequent encounter: Secondary | ICD-10-CM | POA: Diagnosis not present

## 2019-08-21 DIAGNOSIS — M25512 Pain in left shoulder: Secondary | ICD-10-CM | POA: Diagnosis not present

## 2019-08-21 DIAGNOSIS — S46812D Strain of other muscles, fascia and tendons at shoulder and upper arm level, left arm, subsequent encounter: Secondary | ICD-10-CM | POA: Diagnosis not present

## 2019-08-26 DIAGNOSIS — S46812D Strain of other muscles, fascia and tendons at shoulder and upper arm level, left arm, subsequent encounter: Secondary | ICD-10-CM | POA: Diagnosis not present

## 2019-08-26 DIAGNOSIS — M25512 Pain in left shoulder: Secondary | ICD-10-CM | POA: Diagnosis not present

## 2019-08-28 DIAGNOSIS — M25512 Pain in left shoulder: Secondary | ICD-10-CM | POA: Diagnosis not present

## 2019-08-28 DIAGNOSIS — S46812D Strain of other muscles, fascia and tendons at shoulder and upper arm level, left arm, subsequent encounter: Secondary | ICD-10-CM | POA: Diagnosis not present

## 2019-08-30 DIAGNOSIS — E1165 Type 2 diabetes mellitus with hyperglycemia: Secondary | ICD-10-CM | POA: Diagnosis not present

## 2019-08-30 DIAGNOSIS — E1140 Type 2 diabetes mellitus with diabetic neuropathy, unspecified: Secondary | ICD-10-CM | POA: Diagnosis not present

## 2019-08-30 DIAGNOSIS — I10 Essential (primary) hypertension: Secondary | ICD-10-CM | POA: Diagnosis not present

## 2019-08-30 DIAGNOSIS — K219 Gastro-esophageal reflux disease without esophagitis: Secondary | ICD-10-CM | POA: Diagnosis not present

## 2019-08-30 DIAGNOSIS — D638 Anemia in other chronic diseases classified elsewhere: Secondary | ICD-10-CM | POA: Diagnosis not present

## 2019-08-30 DIAGNOSIS — E785 Hyperlipidemia, unspecified: Secondary | ICD-10-CM | POA: Diagnosis not present

## 2019-08-30 DIAGNOSIS — I25810 Atherosclerosis of coronary artery bypass graft(s) without angina pectoris: Secondary | ICD-10-CM | POA: Diagnosis not present

## 2019-09-03 DIAGNOSIS — M25512 Pain in left shoulder: Secondary | ICD-10-CM | POA: Diagnosis not present

## 2019-09-03 DIAGNOSIS — S46812D Strain of other muscles, fascia and tendons at shoulder and upper arm level, left arm, subsequent encounter: Secondary | ICD-10-CM | POA: Diagnosis not present

## 2019-09-05 DIAGNOSIS — M25512 Pain in left shoulder: Secondary | ICD-10-CM | POA: Diagnosis not present

## 2019-09-05 DIAGNOSIS — S46812D Strain of other muscles, fascia and tendons at shoulder and upper arm level, left arm, subsequent encounter: Secondary | ICD-10-CM | POA: Diagnosis not present

## 2019-09-09 DIAGNOSIS — M25512 Pain in left shoulder: Secondary | ICD-10-CM | POA: Diagnosis not present

## 2019-09-09 DIAGNOSIS — S46812D Strain of other muscles, fascia and tendons at shoulder and upper arm level, left arm, subsequent encounter: Secondary | ICD-10-CM | POA: Diagnosis not present

## 2019-09-11 DIAGNOSIS — S46812D Strain of other muscles, fascia and tendons at shoulder and upper arm level, left arm, subsequent encounter: Secondary | ICD-10-CM | POA: Diagnosis not present

## 2019-09-11 DIAGNOSIS — M25512 Pain in left shoulder: Secondary | ICD-10-CM | POA: Diagnosis not present

## 2019-09-16 DIAGNOSIS — M25512 Pain in left shoulder: Secondary | ICD-10-CM | POA: Diagnosis not present

## 2019-09-16 DIAGNOSIS — S46812D Strain of other muscles, fascia and tendons at shoulder and upper arm level, left arm, subsequent encounter: Secondary | ICD-10-CM | POA: Diagnosis not present

## 2019-09-17 ENCOUNTER — Emergency Department (HOSPITAL_COMMUNITY): Payer: Medicare PPO

## 2019-09-17 ENCOUNTER — Other Ambulatory Visit: Payer: Self-pay

## 2019-09-17 ENCOUNTER — Emergency Department (HOSPITAL_COMMUNITY)
Admission: EM | Admit: 2019-09-17 | Discharge: 2019-09-17 | Disposition: A | Discharge status: Home / Self Care | Payer: Medicare PPO | Attending: Emergency Medicine | Admitting: Emergency Medicine

## 2019-09-17 DIAGNOSIS — E119 Type 2 diabetes mellitus without complications: Secondary | ICD-10-CM | POA: Diagnosis not present

## 2019-09-17 DIAGNOSIS — I2510 Atherosclerotic heart disease of native coronary artery without angina pectoris: Secondary | ICD-10-CM | POA: Diagnosis not present

## 2019-09-17 DIAGNOSIS — Z8546 Personal history of malignant neoplasm of prostate: Secondary | ICD-10-CM | POA: Diagnosis not present

## 2019-09-17 DIAGNOSIS — Z951 Presence of aortocoronary bypass graft: Secondary | ICD-10-CM | POA: Diagnosis not present

## 2019-09-17 DIAGNOSIS — I10 Essential (primary) hypertension: Secondary | ICD-10-CM | POA: Insufficient documentation

## 2019-09-17 DIAGNOSIS — R0602 Shortness of breath: Secondary | ICD-10-CM | POA: Diagnosis not present

## 2019-09-17 DIAGNOSIS — Z79899 Other long term (current) drug therapy: Secondary | ICD-10-CM | POA: Insufficient documentation

## 2019-09-17 DIAGNOSIS — Z794 Long term (current) use of insulin: Secondary | ICD-10-CM | POA: Insufficient documentation

## 2019-09-17 DIAGNOSIS — Z7982 Long term (current) use of aspirin: Secondary | ICD-10-CM | POA: Insufficient documentation

## 2019-09-17 LAB — BASIC METABOLIC PANEL
Anion gap: 12 (ref 5–15)
BUN: 17 mg/dL (ref 8–23)
CO2: 30 mmol/L (ref 22–32)
Calcium: 9.2 mg/dL (ref 8.9–10.3)
Chloride: 95 mmol/L — ABNORMAL LOW (ref 98–111)
Creatinine, Ser: 1.08 mg/dL (ref 0.61–1.24)
GFR calc Af Amer: >60 mL/min (ref >60)
GFR calc non Af Amer: >60 mL/min (ref >60)
Glucose, Bld: 104 mg/dL — ABNORMAL HIGH (ref 70–99)
Potassium: 3.5 mmol/L (ref 3.5–5.1)
Sodium: 137 mmol/L (ref 135–145)

## 2019-09-17 LAB — CBC
HCT: 36.8 % — ABNORMAL LOW (ref 39.0–52.0)
Hemoglobin: 12.2 g/dL — ABNORMAL LOW (ref 13.0–17.0)
MCH: 31.7 pg (ref 26.0–34.0)
MCHC: 33.2 g/dL (ref 30.0–36.0)
MCV: 95.6 fL (ref 80.0–100.0)
Platelets: 198 10*3/uL (ref 150–400)
RBC: 3.85 MIL/uL — ABNORMAL LOW (ref 4.22–5.81)
RDW: 12.2 % (ref 11.5–15.5)
WBC: 7.4 10*3/uL (ref 4.0–10.5)
nRBC: 0 % (ref 0.0–0.2)

## 2019-09-17 LAB — TROPONIN I (HIGH SENSITIVITY): Troponin I (High Sensitivity): 5 ng/L

## 2019-09-17 MED ORDER — PANTOPRAZOLE SODIUM 40 MG PO TBEC: 40.0000 mg | DELAYED_RELEASE_TABLET | Freq: Every day | ORAL | 0 refills | Status: DC

## 2019-09-17 MED ORDER — ALUM & MAG HYDROXIDE-SIMETH 200-200-20 MG/5ML PO SUSP
30.0000 mL | Freq: Once | ORAL | Status: AC
  Administered 2019-09-17: 30 mL via ORAL
  Filled 2019-09-17: qty 30

## 2019-09-17 MED ORDER — PANTOPRAZOLE SODIUM 40 MG PO TBEC
40.0000 mg | DELAYED_RELEASE_TABLET | Freq: Once | ORAL | Status: AC
  Administered 2019-09-17: 40 mg via ORAL
  Filled 2019-09-17: qty 1

## 2019-09-17 MED ORDER — LIDOCAINE VISCOUS HCL 2 % MT SOLN
15.0000 mL | Freq: Once | OROMUCOSAL | Status: AC
  Administered 2019-09-17: 15 mL via ORAL
  Filled 2019-09-17: qty 15

## 2019-09-17 NOTE — ED Provider Notes (Addendum)
[facility] EMERGENCY DEPARTMENT Provider Note   CSN: [id number] Arrival date & time: 09/17/19  1411     History Chief Complaint  Patient presents with  . Shortness of Breath    Alexander Choi is a 86 y.o. male.  HPI   This patient is an 86 year old male, he has multiple medical problem, history of prostate cancer, acid reflux, hypercholesterolemia and a history of coronary bypass grafting approximately 17 years ago.  The patient reports that he is always short of breath, he can usually walk a mile give or take, he gets short of breath after about half a mile when walking in the morning and about after 1/10 of a mile if he tries to walk at night.  Today he was feeling short of breath while he was trying to get ready to leave the house which was a little bit unusual for him.  When he called his doctor they told him to come to the hospital to be evaluated.  The patient reports he is also having significant acid reflux symptoms feeling of burning in his throat and feeling like he coughs when he tries to eat.  He is not currently short of breath or having any chest pain but does feel an acid burning feeling in his throat.  He denies swelling of the legs, denies chest pain with exertion or at any time, denies headache.  He is struggling with numbness in his fingers of his left hand after having shoulder surgery which is a chronic problem ever since the surgical procedure.  He currently takes an aspirin, he does not take any other blood thinners, he has never had a pulmonary embolism, he denies urination problems diarrhea, nausea or vomiting.  He states he has had both doses of his Covid vaccine  Past Medical History:  Diagnosis Date  . Cancer ([place])   . Chronic kidney disease    kidney stones  . Coronary artery disease    with CABG in 2006 with LIMA to LAD, SVG to mid and distal OM, SVG to AM. Normal Myoview in May of 2013  . Diabetes mellitus    Type 2  . GERD  (gastroesophageal reflux disease)   . Hematuria   . Hypercholesterolemia   . Hypertension   . Irritable bowel syndrome   . Loose stools   . Prostate cancer ([place])   . Urinary obstruction     Patient Active Problem List   Diagnosis Date Noted  . Coronary artery disease   . Hypertension   . Hypercholesterolemia   . Diabetes mellitus ([place])     Past Surgical History:  Procedure Laterality Date  . CHOLECYSTECTOMY    . CORONARY ARTERY BYPASS GRAFT  2006  . LEFT HEART CATHETERIZATION WITH CORONARY/GRAFT ANGIOGRAM N/A 04/11/2014   Procedure: LEFT HEART CATHETERIZATION WITH [name];  Surgeon: Peter M [name], MD;  Location: [facility] CATH LAB;  Service: Cardiovascular;  Laterality: N/A;  . OTHER SURGICAL HISTORY     bowel obstruction  . RADIOACTIVE SEED IMPLANT     for prostate cancer  . SKIN CANCER EXCISION         Family History  Problem Relation Age of Onset  . Cancer Mother        Leukemia  . Hypertension Mother   . Cancer Father        Colon  . Heart attack Father        Had Several in his 6's  . Hypertension Father   .  Diabetes Father     Social History   Tobacco Use  . Smoking status: Former Smoker    Packs/day: 3.00  . Smokeless tobacco: Never Used  Substance Use Topics  . Alcohol use: No  . Drug use: No    Home Medications Prior to Admission medications   Medication Sig Start Date End Date Taking? Authorizing Provider  Acetaminophen (TYLENOL ARTHRITIS PAIN PO) Take 2 tablets by mouth daily.     [provider]  aspirin 81 MG tablet Take 81 mg by mouth daily.      [provider]  atorvastatin (LIPITOR) 40 MG tablet Take 40 mg by mouth daily.    [provider]  cimetidine (TAGAMET) 400 MG tablet Take 400 mg by mouth 2 (two) times daily.    [provider]  dicyclomine (BENTYL) 10 MG capsule  12/24/18   [provider]  FREESTYLE LITE test strip As directed 05/05/13   [provider]    gabapentin (NEURONTIN) 100 MG capsule Take 100 mg by mouth 3 (three) times daily. 12/03/14   [provider]  glimepiride (AMARYL) 1 MG tablet Take 1 mg by mouth daily with breakfast. 1/2 in the morning 1/2 at night    [provider]  hydrochlorothiazide (HYDRODIURIL) 25 MG tablet TAKE 1 TABLET BY MOUTH ONCE DAILY 05/31/18   [provider]  Hyoscyamine Sulfate SL 0.125 MG SUBL DISSOLVE 1 TABLET IN MOUTH EVERY 4 TO 6 HOURS AS NEEDED FOR DIARRHEA 05/31/18   [provider]  losartan (COZAAR) 100 MG tablet TAKE 1 TABLET BY MOUTH ONCE DAILY 05/31/18   [provider]  metFORMIN (GLUCOPHAGE) 500 MG tablet Take 500 mg by mouth 2 (two) times daily with a meal.  03/10/14   [provider]  metoprolol succinate (TOPROL-XL) 25 MG 24 hr tablet Take 25 mg by mouth daily.      [provider]  nitroGLYCERIN (NITROSTAT) 0.4 MG SL tablet Place 1 tablet (0.4 mg total) under the tongue every 5 (five) minutes as needed for chest pain. 06/05/15   [name], Peter M, MD  NONFORMULARY OR COMPOUNDED [facility]:  Peripheral Neuropathy Cream - Bupivacaine 1%, doxepin 3%, Gabapentin 6%, Pentoxifylline 3%, Topiramate 1%, apply 1-2 grams to affected area 3-4 times daily. 02/25/16   Landis Martins, DPM  pantoprazole (PROTONIX) 40 MG tablet Take 1 tablet (40 mg total) by mouth daily. 09/17/19 01/15/20  Noemi Chapel, MD  valsartan-hydrochlorothiazide (DIOVAN-HCT) 320-25 MG tablet Take by mouth.    [provider]    Allergies    Ace inhibitors; Nitrates, organic; and Penicillins  Review of Systems   Review of Systems  All other systems reviewed and are negative.   Physical Exam Updated Vital Signs BP (!) 158/53   Pulse (!) 51   Temp 97.7 F (36.5 C) (Oral)   Resp 13   Ht 1.778 m (5\' 10" )   SpO2 98%   BMI 28.67 kg/m   Physical Exam Vitals and nursing note reviewed.  Constitutional:      General: He is not in acute distress.     Appearance: He is well-developed.  HENT:     Head: Normocephalic and atraumatic.     Mouth/Throat:     Pharynx: No oropharyngeal exudate.  Eyes:     General: No scleral icterus.       Right eye: No discharge.        Left eye: No discharge.     Conjunctiva/sclera: Conjunctivae normal.  Pupils: Pupils are equal, round, and reactive to light.  Neck:     Thyroid: No thyromegaly.     Vascular: No JVD.  Cardiovascular:     Rate and Rhythm: Normal rate and regular rhythm.     Heart sounds: Normal heart sounds. No murmur. No friction rub. No gallop.   Pulmonary:     Effort: Pulmonary effort is normal. No respiratory distress.     Breath sounds: Normal breath sounds. No wheezing or rales.     Comments: Lung sounds are slightly diminished at the left base but no distress, no rales, no wheezing Abdominal:     General: Bowel sounds are normal. There is no distension.     Palpations: Abdomen is soft. There is no mass.     Tenderness: There is no abdominal tenderness.  Musculoskeletal:        General: No tenderness. Normal range of motion.     Cervical back: Normal range of motion and neck supple.  Lymphadenopathy:     Cervical: No cervical adenopathy.  Skin:    General: Skin is warm and dry.     Findings: No erythema or rash.  Neurological:     Mental Status: He is alert.     Coordination: Coordination normal.  Psychiatric:        Behavior: Behavior normal.     ED Results / Procedures / Treatments   Labs (all labs ordered are listed, but only abnormal results are displayed) Labs Reviewed  CBC - Abnormal; Notable for the following components:      Result Value   RBC 3.85 (*)    Hemoglobin 12.2 (*)    HCT 36.8 (*)    All other components within normal limits  BASIC METABOLIC PANEL - Abnormal; Notable for the following components:   Chloride 95 (*)    Glucose, Bld 104 (*)    All other components within normal limits  TROPONIN I (HIGH SENSITIVITY)    EKG EKG  Interpretation  Date/Time:  Tuesday September 17 2019 14:31:26 EDT Ventricular Rate:  58 PR Interval:  222 QRS Duration: 100 QT Interval:  410 QTC Calculation: 402 R Axis:   51 Text Interpretation: Normal sinus rhythm Normal ECG since last tracing no significant change Confirmed by Noemi Chapel [phone]) on 09/17/2019 5:46:20 PM   Radiology DG Chest 2 View  Result Date: 09/17/2019 CLINICAL DATA:  Shortness of breath for several hours EXAM: CHEST - 2 VIEW COMPARISON:  05/11/2019 FINDINGS: Cardiac shadow is stable. Elevation of the left hemidiaphragm is again noted. Postsurgical changes are again seen. Aortic calcifications are noted. No focal infiltrate or sizable effusion is seen. IMPRESSION: No acute abnormality noted. Electronically Signed   By: Inez Catalina M.D.   On: 09/17/2019 15:08    Procedures Procedures (including critical care time)  Medications Ordered in ED Medications  alum & mag hydroxide-simeth (MAALOX/MYLANTA) 200-200-20 MG/5ML suspension 30 mL (30 mLs Oral Given 09/17/19 1824)    And  lidocaine (XYLOCAINE) 2 % viscous mouth solution 15 mL (15 mLs Oral Given 09/17/19 1824)  pantoprazole (PROTONIX) EC tablet 40 mg (40 mg Oral Given 09/17/19 1824)    ED Course  I have reviewed the triage vital signs and the nursing notes.  Pertinent labs & imaging results that were available during my care of the patient were reviewed by me and considered in my medical decision making (see chart for details).    MDM Rules/Calculators/A&P  This patient has some intermittent shortness of breath which seems to come on with exertion.  Usually he has this only when he is walking longer distances, today it seemed to come on at rest, I have looked at his EKG, his chest x-ray and his lab work none of which are remarkable whatsoever.  He has chronic elevation of the left hemidiaphragm explains the decreased sounds at the left base.  There is no signs of infiltrate or pneumonia,  no signs of arrhythmia or ischemia on the EKG, will add a troponin, otherwise this patient is well-appearing and I anticipate discharge.  I will be more aggressive about treating his acid reflux as he is currently taking no PPI - will add.  Pt feels better with PPI and Gi cocktail - the CXR is without findings of concern as is the EKG,  Pt feels no SOB, stable for d/c.   Final Clinical Impression(s) / ED Diagnoses Final diagnoses:  Shortness of breath    Rx / DC Orders ED Discharge Orders         Ordered    pantoprazole (PROTONIX) 40 MG tablet  Daily     09/17/19 2011           Noemi Chapel, MD 09/17/19 2011    Noemi Chapel, MD 09/17/19 2012

## 2019-09-17 NOTE — ED Triage Notes (Signed)
Pt here for evaluation of shortness of breath onset this morning while walking around his basement. Endorses lower back pain. Endorses coughing when eating or drinking. Denies sick contacts.

## 2019-09-17 NOTE — Discharge Instructions (Signed)
Your testing has not shown a cause of your symptoms Your xray and blood work are reassuring.  To the emergency department for any other severe or worsening symptoms including fevers vomiting difficulty breathing or chest pain.  Please start taking Protonix daily

## 2019-09-17 NOTE — ED Notes (Signed)
Alexander Choi (Son#[phone]) called/would like to come visit once in a room.

## 2019-09-17 NOTE — ED Notes (Signed)
Patient verbalizes understanding of discharge instructions. Opportunity for questioning and answers were provided. Armband removed by staff, pt discharged from ED.  

## 2019-09-19 DIAGNOSIS — M25512 Pain in left shoulder: Secondary | ICD-10-CM | POA: Diagnosis not present

## 2019-09-19 DIAGNOSIS — S46812D Strain of other muscles, fascia and tendons at shoulder and upper arm level, left arm, subsequent encounter: Secondary | ICD-10-CM | POA: Diagnosis not present

## 2019-09-19 DIAGNOSIS — I25810 Atherosclerosis of coronary artery bypass graft(s) without angina pectoris: Secondary | ICD-10-CM | POA: Diagnosis not present

## 2019-09-19 DIAGNOSIS — K219 Gastro-esophageal reflux disease without esophagitis: Secondary | ICD-10-CM | POA: Diagnosis not present

## 2019-09-20 DIAGNOSIS — G5622 Lesion of ulnar nerve, left upper limb: Secondary | ICD-10-CM | POA: Insufficient documentation

## 2019-09-20 DIAGNOSIS — G5602 Carpal tunnel syndrome, left upper limb: Secondary | ICD-10-CM | POA: Insufficient documentation

## 2019-09-20 DIAGNOSIS — E1142 Type 2 diabetes mellitus with diabetic polyneuropathy: Secondary | ICD-10-CM | POA: Diagnosis not present

## 2019-09-20 DIAGNOSIS — M79642 Pain in left hand: Secondary | ICD-10-CM | POA: Diagnosis not present

## 2019-09-23 DIAGNOSIS — S46812D Strain of other muscles, fascia and tendons at shoulder and upper arm level, left arm, subsequent encounter: Secondary | ICD-10-CM | POA: Diagnosis not present

## 2019-09-23 DIAGNOSIS — M25512 Pain in left shoulder: Secondary | ICD-10-CM | POA: Diagnosis not present

## 2019-09-25 DIAGNOSIS — M25512 Pain in left shoulder: Secondary | ICD-10-CM | POA: Diagnosis not present

## 2019-09-25 DIAGNOSIS — S46812D Strain of other muscles, fascia and tendons at shoulder and upper arm level, left arm, subsequent encounter: Secondary | ICD-10-CM | POA: Diagnosis not present

## 2019-10-02 DIAGNOSIS — Z809 Family history of malignant neoplasm, unspecified: Secondary | ICD-10-CM | POA: Diagnosis not present

## 2019-10-02 DIAGNOSIS — Z87891 Personal history of nicotine dependence: Secondary | ICD-10-CM | POA: Diagnosis not present

## 2019-10-02 DIAGNOSIS — K2970 Gastritis, unspecified, without bleeding: Secondary | ICD-10-CM | POA: Diagnosis not present

## 2019-10-02 DIAGNOSIS — E785 Hyperlipidemia, unspecified: Secondary | ICD-10-CM | POA: Diagnosis not present

## 2019-10-02 DIAGNOSIS — K219 Gastro-esophageal reflux disease without esophagitis: Secondary | ICD-10-CM | POA: Diagnosis not present

## 2019-10-02 DIAGNOSIS — I10 Essential (primary) hypertension: Secondary | ICD-10-CM | POA: Diagnosis not present

## 2019-10-02 DIAGNOSIS — I25119 Atherosclerotic heart disease of native coronary artery with unspecified angina pectoris: Secondary | ICD-10-CM | POA: Diagnosis not present

## 2019-10-02 DIAGNOSIS — Z880 Allergy status to penicillin: Secondary | ICD-10-CM | POA: Diagnosis not present

## 2019-10-02 DIAGNOSIS — M25512 Pain in left shoulder: Secondary | ICD-10-CM | POA: Diagnosis not present

## 2019-10-02 DIAGNOSIS — E1142 Type 2 diabetes mellitus with diabetic polyneuropathy: Secondary | ICD-10-CM | POA: Diagnosis not present

## 2019-10-02 DIAGNOSIS — Z7984 Long term (current) use of oral hypoglycemic drugs: Secondary | ICD-10-CM | POA: Diagnosis not present

## 2019-10-02 DIAGNOSIS — Z9181 History of falling: Secondary | ICD-10-CM | POA: Diagnosis not present

## 2019-10-02 DIAGNOSIS — S46812D Strain of other muscles, fascia and tendons at shoulder and upper arm level, left arm, subsequent encounter: Secondary | ICD-10-CM | POA: Diagnosis not present

## 2019-10-02 DIAGNOSIS — M1990 Unspecified osteoarthritis, unspecified site: Secondary | ICD-10-CM | POA: Diagnosis not present

## 2019-10-02 DIAGNOSIS — Z85828 Personal history of other malignant neoplasm of skin: Secondary | ICD-10-CM | POA: Diagnosis not present

## 2019-10-09 DIAGNOSIS — S46812D Strain of other muscles, fascia and tendons at shoulder and upper arm level, left arm, subsequent encounter: Secondary | ICD-10-CM | POA: Diagnosis not present

## 2019-10-09 DIAGNOSIS — G5602 Carpal tunnel syndrome, left upper limb: Secondary | ICD-10-CM | POA: Diagnosis not present

## 2019-10-09 DIAGNOSIS — M25512 Pain in left shoulder: Secondary | ICD-10-CM | POA: Diagnosis not present

## 2019-10-11 DIAGNOSIS — E1140 Type 2 diabetes mellitus with diabetic neuropathy, unspecified: Secondary | ICD-10-CM | POA: Diagnosis not present

## 2019-10-11 DIAGNOSIS — E1165 Type 2 diabetes mellitus with hyperglycemia: Secondary | ICD-10-CM | POA: Diagnosis not present

## 2019-10-14 DIAGNOSIS — K5730 Diverticulosis of large intestine without perforation or abscess without bleeding: Secondary | ICD-10-CM | POA: Diagnosis not present

## 2019-10-14 DIAGNOSIS — K219 Gastro-esophageal reflux disease without esophagitis: Secondary | ICD-10-CM | POA: Diagnosis not present

## 2019-10-14 DIAGNOSIS — R1013 Epigastric pain: Secondary | ICD-10-CM | POA: Diagnosis not present

## 2019-10-14 DIAGNOSIS — D126 Benign neoplasm of colon, unspecified: Secondary | ICD-10-CM | POA: Diagnosis not present

## 2019-10-16 DIAGNOSIS — M25512 Pain in left shoulder: Secondary | ICD-10-CM | POA: Diagnosis not present

## 2019-10-16 DIAGNOSIS — Z20822 Contact with and (suspected) exposure to covid-19: Secondary | ICD-10-CM | POA: Diagnosis not present

## 2019-10-16 DIAGNOSIS — S46812D Strain of other muscles, fascia and tendons at shoulder and upper arm level, left arm, subsequent encounter: Secondary | ICD-10-CM | POA: Diagnosis not present

## 2019-10-16 DIAGNOSIS — Z20828 Contact with and (suspected) exposure to other viral communicable diseases: Secondary | ICD-10-CM | POA: Diagnosis not present

## 2019-10-21 DIAGNOSIS — S46812D Strain of other muscles, fascia and tendons at shoulder and upper arm level, left arm, subsequent encounter: Secondary | ICD-10-CM | POA: Diagnosis not present

## 2019-10-21 DIAGNOSIS — M25512 Pain in left shoulder: Secondary | ICD-10-CM | POA: Diagnosis not present

## 2019-10-22 DIAGNOSIS — I10 Essential (primary) hypertension: Secondary | ICD-10-CM | POA: Diagnosis not present

## 2019-10-22 DIAGNOSIS — Z951 Presence of aortocoronary bypass graft: Secondary | ICD-10-CM | POA: Diagnosis not present

## 2019-10-22 DIAGNOSIS — R0789 Other chest pain: Secondary | ICD-10-CM | POA: Diagnosis not present

## 2019-10-22 DIAGNOSIS — E119 Type 2 diabetes mellitus without complications: Secondary | ICD-10-CM | POA: Diagnosis not present

## 2019-10-22 DIAGNOSIS — K2970 Gastritis, unspecified, without bleeding: Secondary | ICD-10-CM | POA: Diagnosis not present

## 2019-10-22 DIAGNOSIS — K2940 Chronic atrophic gastritis without bleeding: Secondary | ICD-10-CM | POA: Diagnosis not present

## 2019-10-22 DIAGNOSIS — K219 Gastro-esophageal reflux disease without esophagitis: Secondary | ICD-10-CM | POA: Diagnosis not present

## 2019-10-22 DIAGNOSIS — Z87891 Personal history of nicotine dependence: Secondary | ICD-10-CM | POA: Diagnosis not present

## 2019-10-22 DIAGNOSIS — Z85828 Personal history of other malignant neoplasm of skin: Secondary | ICD-10-CM | POA: Diagnosis not present

## 2019-10-22 DIAGNOSIS — K449 Diaphragmatic hernia without obstruction or gangrene: Secondary | ICD-10-CM | POA: Diagnosis not present

## 2019-10-22 DIAGNOSIS — R079 Chest pain, unspecified: Secondary | ICD-10-CM | POA: Diagnosis not present

## 2019-10-28 DIAGNOSIS — Z1159 Encounter for screening for other viral diseases: Secondary | ICD-10-CM | POA: Diagnosis not present

## 2019-10-28 DIAGNOSIS — Z1152 Encounter for screening for COVID-19: Secondary | ICD-10-CM | POA: Diagnosis not present

## 2019-11-01 DIAGNOSIS — Z7982 Long term (current) use of aspirin: Secondary | ICD-10-CM | POA: Diagnosis not present

## 2019-11-01 DIAGNOSIS — M1990 Unspecified osteoarthritis, unspecified site: Secondary | ICD-10-CM | POA: Diagnosis not present

## 2019-11-01 DIAGNOSIS — E119 Type 2 diabetes mellitus without complications: Secondary | ICD-10-CM | POA: Diagnosis not present

## 2019-11-01 DIAGNOSIS — I2510 Atherosclerotic heart disease of native coronary artery without angina pectoris: Secondary | ICD-10-CM | POA: Diagnosis not present

## 2019-11-01 DIAGNOSIS — K219 Gastro-esophageal reflux disease without esophagitis: Secondary | ICD-10-CM | POA: Diagnosis not present

## 2019-11-01 DIAGNOSIS — I10 Essential (primary) hypertension: Secondary | ICD-10-CM | POA: Diagnosis not present

## 2019-11-01 DIAGNOSIS — Z8546 Personal history of malignant neoplasm of prostate: Secondary | ICD-10-CM | POA: Diagnosis not present

## 2019-11-01 DIAGNOSIS — G5602 Carpal tunnel syndrome, left upper limb: Secondary | ICD-10-CM | POA: Diagnosis not present

## 2019-11-01 DIAGNOSIS — E785 Hyperlipidemia, unspecified: Secondary | ICD-10-CM | POA: Diagnosis not present

## 2019-11-18 ENCOUNTER — Telehealth: Payer: Self-pay | Admitting: Cardiology

## 2019-11-18 NOTE — Telephone Encounter (Signed)
It certainly is more consistent with acid reflux. Agree with follow up with primary care. I reviewed records and Ecg from when he went to the ED in March and looked good from my standpoint.  Mackay Hanauer [name] MD, [facility]

## 2019-11-18 NOTE — Telephone Encounter (Signed)
New Message  Pt c/o Shortness Of Breath: STAT if SOB developed within the last 24 hours or pt is noticeably SOB on the phone  1. Are you currently SOB (can you hear that pt is SOB on the phone)? Yes  2. How long have you been experiencing SOB? Around 3/24  3. Are you SOB when sitting or when up moving around? Moving around.,  4. Are you currently experiencing any other symptoms? Numbness in the left arm

## 2019-11-18 NOTE — Telephone Encounter (Signed)
Incoming call from the patient. He stated that he has been having indigestion for the past few months which is worse at night and a numbness in his left arm. He stated that the numbness in his left arm has been there since shoulder surgery in December.   He went to the ED in March and was prescribed Protonix 40 mg. He stated that he takes that and Tums for relief. He denies swelling and chest pain.  He has been advised that he should follow up with his PCP about the indigestion as they might be able to try a different medication.   He wanted Dr. [name] to know to see if he felt like it may be cardiac related.

## 2019-11-18 NOTE — Telephone Encounter (Signed)
Spoke to patient Dr.Jordan's advice given.Stated GI Dr.thought sob coming from heart.Appointment scheduled with Almyra Deforest PA 5/27 at 2:45 pm.

## 2019-11-21 ENCOUNTER — Other Ambulatory Visit: Payer: Self-pay

## 2019-11-21 ENCOUNTER — Ambulatory Visit: Payer: Medicare PPO | Admitting: Physician Assistant

## 2019-11-21 VITALS — BP 118/62 | HR 51 | Temp 97.3°F | Ht 69.0 in | Wt 183.0 lb

## 2019-11-21 DIAGNOSIS — E119 Type 2 diabetes mellitus without complications: Secondary | ICD-10-CM

## 2019-11-21 DIAGNOSIS — E785 Hyperlipidemia, unspecified: Secondary | ICD-10-CM

## 2019-11-21 DIAGNOSIS — I10 Essential (primary) hypertension: Secondary | ICD-10-CM | POA: Diagnosis not present

## 2019-11-21 DIAGNOSIS — I25709 Atherosclerosis of coronary artery bypass graft(s), unspecified, with unspecified angina pectoris: Secondary | ICD-10-CM

## 2019-11-21 MED ORDER — AMLODIPINE BESYLATE 2.5 MG PO TABS: 2.5000 mg | ORAL_TABLET | Freq: Every day | ORAL | 1 refills | Status: DC

## 2019-11-21 NOTE — Progress Notes (Signed)
Cardiology Office Note:    Date:  11/23/2019   ID:  Alexander Choi, DOB 04-27-1933, MRN [id number]  PCP:  Nicoletta Dress, MD  Cardiologist:  Peter [name], MD  Electrophysiologist:  None   Referring MD: Nicoletta Dress, MD   Chief Complaint  Patient presents with  . Follow-up    seen for Dr. [name]    History of Present Illness:    Alexander Choi is a 86 y.o. male with a hx of CAD s/p CABG 2006, HTN, HLD, and DM II. he had a Myoview in October 2015 that was positive for ischemia in the inferior wall.  Last cardiac catheterization performed by Dr. [name] on 04/11/2014 showed severe three-vessel CAD with patent LIMA to LAD, patent sequential SVG to OM1, patent SVG to [facility] branch of RCA, although there was potential area of ischemia including the distal LAD and mid to distal left circumflex with OM 2 and OM 3 and a collateral to the distal RCA, disease in proximal to mid left circumflex vessel was very complex and aneurysmal and not amenable to PCI.  Eventually, it was decided to pursue medical management.  He was last seen by Dr. [name] in January 2021 at which time he was doing well.  Patient presents today for cardiology office visit evaluation.  He was seen in the ED in March 2021 with shortness of breath and his symptom was felt to be related to acid reflux symptom.  He was discharged on PPI Protonix.  Since then, he continued to have acid reflux-like symptoms.  He also have left arm pain.  Although patient admits to have bad left shoulder and a possible carpal tunnel in the left arm, he says he has left arm pain was also his original anginal symptom years ago prior to bypass surgery.  He has been noticing acid reflux symptom after both meals and physical activity.  He describes the symptom as a burning sensation along with burping.  He says he can walk roughly 5 to 7 minutes before the acid reflux symptom come on.  This has been steady for the past 2 months and has not increase in  frequency or duration.  I am not confident that his acid reflux symptom is noncardiac in nature.  I recommended stopping his hydrochlorothiazide and adding amlodipine 2.5 mg daily.  Even though valsartan-HCTZ is also on his medication list, he says that was taken off years ago.  We will remove valsartan-HCTZ off of his medication list.  I will see the patient back in 2 weeks for reassessment.  I am hesitant to proceed with Myoview at this time as he had positive Myoview in 2015 prior to his cardiac catheterization.  Medical therapy was pursued at the time.  I suspect if we do a repeat Myoview, it will come back abnormal either way.  Past Medical History:  Diagnosis Date  . Cancer ([place])   . Chronic kidney disease    kidney stones  . Coronary artery disease    with CABG in 2006 with LIMA to LAD, SVG to mid and distal OM, SVG to AM. Normal Myoview in May of 2013  . Diabetes mellitus    Type 2  . GERD (gastroesophageal reflux disease)   . Hematuria   . Hypercholesterolemia   . Hypertension   . Irritable bowel syndrome   . Loose stools   . Prostate cancer ([place])   . Urinary obstruction     Past Surgical History:  Procedure Laterality  Date  . CHOLECYSTECTOMY    . CORONARY ARTERY BYPASS GRAFT  2006  . LEFT HEART CATHETERIZATION WITH CORONARY/GRAFT ANGIOGRAM N/A 04/11/2014   Procedure: LEFT HEART CATHETERIZATION WITH [name];  Surgeon: Peter M [name], MD;  Location: [facility] LAB;  Service: Cardiovascular;  Laterality: N/A;  . OTHER SURGICAL HISTORY     bowel obstruction  . RADIOACTIVE SEED IMPLANT     for prostate cancer  . SKIN CANCER EXCISION      Current Medications: Current Meds  Medication Sig  . Accu-Chek Softclix Lancets lancets   . Accu-Chek Softclix Lancets lancets   . Acetaminophen (TYLENOL ARTHRITIS PAIN PO) Take 2 tablets by mouth daily.   Marland Kitchen aspirin 81 MG tablet Take 81 mg by mouth daily.    Marland Kitchen atorvastatin (LIPITOR) 40 MG tablet Take 40 mg by mouth daily.   . cimetidine (TAGAMET) 400 MG tablet Take 400 mg by mouth 2 (two) times daily.  . cyanocobalamin 100 MCG tablet Take 100 mcg by mouth daily. 2 capsules daily  . dicyclomine (BENTYL) 10 MG capsule   . gabapentin (NEURONTIN) 100 MG capsule Take 100 mg by mouth 3 (three) times daily.  Marland Kitchen glimepiride (AMARYL) 4 MG tablet   . glucose blood (ACCU-CHEK AVIVA PLUS) test strip   . Hyoscyamine Sulfate SL 0.125 MG SUBL DISSOLVE 1 TABLET IN MOUTH EVERY 4 TO 6 HOURS AS NEEDED FOR DIARRHEA  . losartan (COZAAR) 100 MG tablet TAKE 1 TABLET BY MOUTH ONCE DAILY  . metFORMIN (GLUCOPHAGE) 500 MG tablet Take 500 mg by mouth 2 (two) times daily with a meal.   . metoprolol succinate (TOPROL-XL) 25 MG 24 hr tablet Take 25 mg by mouth daily.    . nitroGLYCERIN (NITROSTAT) 0.4 MG SL tablet Place 1 tablet (0.4 mg total) under the tongue every 5 (five) minutes as needed for chest pain.  . NONFORMULARY OR COMPOUNDED ITEM [facility]:  Peripheral Neuropathy Cream - Bupivacaine 1%, doxepin 3%, Gabapentin 6%, Pentoxifylline 3%, Topiramate 1%, apply 1-2 grams to affected area 3-4 times daily.  . pantoprazole (PROTONIX) 40 MG tablet Take 1 tablet (40 mg total) by mouth daily.  . [DISCONTINUED] hydrochlorothiazide (HYDRODIURIL) 25 MG tablet TAKE 1 TABLET BY MOUTH ONCE DAILY     Allergies:   Ace inhibitors; Nitrates, organic; and Penicillins   Social History   Socioeconomic History  . Marital status: Married    Spouse name: Not on file  . Number of children: 3  . Years of education: Not on file  . Highest education level: Not on file  Occupational History  . Occupation: state trooper    Comment: retired  . 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 Smoking status: Former Smoker    Packs/day: 3.00  . Smokeless tobacco: Never Used  Substance and Sexual Activity  . Alcohol use: No  . Drug use: No  . Sexual activity: Not Currently  Other Topics Concern  . Not on file  Social History Narrative  . Not on file    Social Determinants of Health   Financial Resource Strain:   . Difficulty of Paying Living Expenses:   Food Insecurity:   . Worried About Charity fundraiser in the Last Year:   . Arboriculturist in the Last Year:   Transportation Needs:   . Film/video editor (Medical):   Marland Kitchen Lack of Transportation (Non-Medical):   Physical Activity:   . Days of Exercise per Week:   . Minutes of Exercise per  Session:   Stress:   . Feeling of Stress :   Social Connections:   . Frequency of Communication with Friends and Family:   . Frequency of Social Gatherings with Friends and Family:   . Attends Religious Services:   . Active Member of Clubs or Organizations:   . Attends Archivist Meetings:   Marland Kitchen Marital Status:      Family History: The patient's family history includes Cancer in his father and mother; Diabetes in his father; Heart attack in his father; Hypertension in his father and mother.  ROS:   Please see the history of present illness.     All other systems reviewed and are negative.  EKGs/Labs/Other Studies Reviewed:    The following studies were reviewed today:  Cath 10/162015 Procedural Findings: Hemodynamics: AO 112/54 mean 77 mm Hg LV 118/15 mm Hg  Coronary angiography: Coronary dominance: right  Left mainstem: 20% proximal narrowing.   Left anterior descending (LAD): The LAD is occluded proximally after the first septal perforator. It is heavily calcified.   There is a small ramus intermediate with a 95% stenosis in the proximal vessel.  Left circumflex (LCx): The LCx is a large vessel with complex disease in the proximal and mid vessel. There is moderate calcification. There is a 90-95% stenosis in the proximal vessel followed by a 90% stenosis in the mid vessel. Between these lesions there is an aneurysmal segment. The first OM has a 95% proximal stenosis and is then occluded in the mid vessel. The Second and third OM branches are normal. The  distal LCx gives a large epicardial collateral to the posterolateral branches of the RCA.  Right coronary artery (RCA): The RCA is heavily calcified. There is a 95% stenosis in the mid vessel. There is diffuse 80-90% disease from the crux into the distal vessel. The PDA is occluded proximally. The PLOM is occluded.  The SVG to the RV marginal branch of the RCA is widely patent.   There is a SVG sequentially to OM1 in the mid and distal vessel is widely patent. It gives collateral flow to the first diagonal.  The LIMA to the LAD is widely patent. There is a focal 90% stenosis in the distal native LAD.  Left ventriculography: Left ventricular systolic function is normal, LVEF is estimated at 60-65%, there is no significant mitral regurgitation   Final Conclusions:   1. Severe 3 vessel obstructive CAD 2. Patent LIMA to the LAD 3. Patent sequential SVG to the OM1 (mid and distal OM) 4. Patent SVG to the [facility] branch of the RCA 5. Normal LV function.  Recommendations: Will review study. Potential areas of ischemia include the distal LAD and the mid to distal LCx with OM2, OM3 and collaterals to the distal RCA. 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   EKG:  EKG is ordered today.  The ekg ordered today demonstrates sinus bradycardia without significant ST-T wave changes  Recent Labs: 09/17/2019: BUN 17; Creatinine, Ser 1.08; Hemoglobin 12.2; Platelets 198; Potassium 3.5; Sodium 137  Recent Lipid Panel No results found for: CHOL, TRIG, HDL, CHOLHDL, VLDL, LDLCALC, LDLDIRECT  Physical Exam:    VS:  BP 118/62   Pulse (!) 51   Temp (!) 97.3 F (36.3 C)   Ht 5\' 9"  (1.753 m)   Wt 183 lb (83 kg)   SpO2 97%   BMI 27.02 kg/m     Wt Readings from Last 3 Encounters:  11/21/19  183 lb (83 kg)  07/16/19 199 lb 12.8 oz (90.6 kg)  03/05/18 202 lb 9.6 oz (91.9 kg)     GEN:  Well nourished, well developed in no acute distress HEENT: Normal NECK: No JVD; No  carotid bruits LYMPHATICS: No lymphadenopathy CARDIAC: RRR, no murmurs, rubs, gallops RESPIRATORY:  Clear to auscultation without rales, wheezing or rhonchi  ABDOMEN: Soft, non-tender, non-distended MUSCULOSKELETAL:  No edema; No deformity  SKIN: Warm and dry NEUROLOGIC:  Alert and oriented x 3 PSYCHIATRIC:  Normal affect   ASSESSMENT:    1. Coronary artery disease involving coronary bypass graft of native heart with angina pectoris ([place])   2. Essential hypertension   3. Hyperlipidemia LDL goal <70   4. Controlled type 2 diabetes mellitus without complication, without long-term current use of insulin (HCC)    PLAN:    In order of problems listed above:  1. CAD s/p CABG: He has been having acid reflux-like symptoms recently.  He is on medication for acid reflux, however symptom seems to persist despite treatment.  The symptoms occur both after meals and with activity.  I am not confident that his acid reflux symptom is truly noncardiac in nature.  I recommended stopping his hydrochlorothiazide and add amlodipine 2.5 mg daily as antianginal therapy.  Myoview is unlikely to be beneficial in this case as he had a severe disease on the last cardiac catheterization following a abnormal Myoview.  Medical therapy was recommended at the time, therefore repeat Myoview likely will come back abnormal anyway  2. Hypertension: Blood pressure stable, switching hydrochlorothiazide to amlodipine for antianginal purposes  3. Hyperlipidemia: On Lipitor 40 mg daily  4. DM2: Managed by primary care provider.   Medication Adjustments/Labs and Tests Ordered: Current medicines are reviewed at length with the patient today.  Concerns regarding medicines are outlined above.  Orders Placed This Encounter  Procedures  . EKG 12-Lead   Meds ordered this encounter  Medications  . amLODipine (NORVASC) 2.5 MG tablet    Sig: Take 1 tablet (2.5 mg total) by mouth daily.    Dispense:  180 tablet    Refill:  1     Patient Instructions  Medication Instructions:   STOP HCTZ (Hydrochlorothiazide)  START Amlodipine 2.5 mg daily.  *If you need a refill on your cardiac medications before your next appointment, please call your pharmacy*   Follow-Up: At [facility], you and your health needs are our priority.  As part of our continuing mission to provide you with exceptional heart care, we have created designated Provider Care Teams.  These Care Teams include your primary Cardiologist (physician) and Advanced Practice Providers (APPs -  Physician Assistants and Nurse Practitioners) who all work together to provide you with the care you need, when you need it.  We recommend signing up for the patient portal called "MyChart".  Sign up information is provided on this After Visit Summary.  MyChart is used to connect with patients for Virtual Visits (Telemedicine).  Patients are able to view lab/test results, encounter notes, upcoming appointments, etc.  Non-urgent messages can be sent to your provider as well.   To learn more about what you can do with MyChart, go to [email or web address].    Your next appointment:   Friday  06/11/2021at 3:15PM  The format for your next appointment:   In Person  Provider:   Almyra Deforest, PA-C  Other Instructions      Signed, Almyra Deforest, [place]  11/23/2019 11:46 PM    [facility]

## 2019-11-21 NOTE — Patient Instructions (Addendum)
Medication Instructions:   STOP HCTZ (Hydrochlorothiazide)  START Amlodipine 2.5 mg daily.  *If you need a refill on your cardiac medications before your next appointment, please call your pharmacy*   Follow-Up: At [facility], you and your health needs are our priority.  As part of our continuing mission to provide you with exceptional heart care, we have created designated Provider Care Teams.  These Care Teams include your primary Cardiologist (physician) and Advanced Practice Providers (APPs -  Physician Assistants and Nurse Practitioners) who all work together to provide you with the care you need, when you need it.  We recommend signing up for the patient portal called "MyChart".  Sign up information is provided on this After Visit Summary.  MyChart is used to connect with patients for Virtual Visits (Telemedicine).  Patients are able to view lab/test results, encounter notes, upcoming appointments, etc.  Non-urgent messages can be sent to your provider as well.   To learn more about what you can do with MyChart, go to [email or web address].    Your next appointment:   Friday  06/11/2021at 3:15PM  The format for your next appointment:   In Person  Provider:   Almyra Deforest, PA-C  Other Instructions

## 2019-11-23 ENCOUNTER — Encounter: Payer: Self-pay | Admitting: Physician Assistant

## 2019-12-03 DIAGNOSIS — K219 Gastro-esophageal reflux disease without esophagitis: Secondary | ICD-10-CM | POA: Diagnosis not present

## 2019-12-03 DIAGNOSIS — E1140 Type 2 diabetes mellitus with diabetic neuropathy, unspecified: Secondary | ICD-10-CM | POA: Diagnosis not present

## 2019-12-03 DIAGNOSIS — E785 Hyperlipidemia, unspecified: Secondary | ICD-10-CM | POA: Diagnosis not present

## 2019-12-03 DIAGNOSIS — D638 Anemia in other chronic diseases classified elsewhere: Secondary | ICD-10-CM | POA: Diagnosis not present

## 2019-12-03 DIAGNOSIS — I25810 Atherosclerosis of coronary artery bypass graft(s) without angina pectoris: Secondary | ICD-10-CM | POA: Diagnosis not present

## 2019-12-03 DIAGNOSIS — E1165 Type 2 diabetes mellitus with hyperglycemia: Secondary | ICD-10-CM | POA: Diagnosis not present

## 2019-12-03 DIAGNOSIS — I10 Essential (primary) hypertension: Secondary | ICD-10-CM | POA: Diagnosis not present

## 2019-12-06 ENCOUNTER — Ambulatory Visit: Payer: Medicare PPO | Admitting: Physician Assistant

## 2019-12-06 NOTE — H&P (View-Only) (Signed)
Alexander Choi Date of Birth: [birth year] Medical Record #[id number]  History of Present Illness: Alexander Choi is seen for follow up CAD. He has known CAD with prior CABG in 2004/09/26, negative Myoview in October 2015 showed inferior ischemia. Cardiac cath showed all grafts patent. There was severe disease in the native LCx that predated his CABG. Other issues include DM, HTN and HLD.   On follow up today he is doing well from a cardiac standpoint.  He denies any dyspnea or chest pain. He is taking Tagamet for some heartburn. His wife passed away in 09-26-2017. He was admitted to [facility] in October 2020 with bowel obstruction. He had surgery with lysis of adhesions. Was in hospital for 16 days. One week later he fell in his garage and reinjured his shoulder. Had shoulder surgery in December.   He was seen in the ED in September 27, 2022 with dyspnea and symptoms of severe indigestion. He has been aggressively treated for indigestion with Tagamet and Protonix without improvement. Notes he had EGD 2 months ago that showed only mild inflammation. States symptoms are worse after eating a meal but also occur with activity. SOB is definitely worse with activity. States he can only swing a gold club 5-6 times before he has to stop. Doesn't take Ntg because it makes him sick. Notes some discomfort in his left arm but has also had carpel tunnel and shoulder surgery done this past year.   He was seen in May by Almyra Deforest PA-C for symptoms more consistent with acid reflux. PPI recommended.   Current Outpatient Medications  Medication Sig Dispense Refill  . Accu-Chek Softclix Lancets lancets     . Accu-Chek Softclix Lancets lancets     . Acetaminophen (TYLENOL ARTHRITIS PAIN PO) Take 2 tablets by mouth daily.     Marland Kitchen amLODipine (NORVASC) 2.5 MG tablet Take 1 tablet (2.5 mg total) by mouth daily. 180 tablet 1  . aspirin 81 MG tablet Take 81 mg by mouth daily.      Marland Kitchen atorvastatin (LIPITOR) 40 MG tablet Take 40 mg by mouth  daily.    . cimetidine (TAGAMET) 400 MG tablet Take 400 mg by mouth 2 (two) times daily.    . cyanocobalamin 100 MCG tablet Take 100 mcg by mouth daily. 2 capsules daily    . dicyclomine (BENTYL) 10 MG capsule     . gabapentin (NEURONTIN) 100 MG capsule Take 100 mg by mouth 3 (three) times daily.    Marland Kitchen glimepiride (AMARYL) 4 MG tablet     . glucose blood (ACCU-CHEK AVIVA PLUS) test strip     . Hyoscyamine Sulfate SL 0.125 MG SUBL DISSOLVE 1 TABLET IN MOUTH EVERY 4 TO 6 HOURS AS NEEDED FOR DIARRHEA    . losartan (COZAAR) 100 MG tablet TAKE 1 TABLET BY MOUTH ONCE DAILY    . metFORMIN (GLUCOPHAGE) 500 MG tablet Take 500 mg by mouth 2 (two) times daily with a meal.     . metoprolol succinate (TOPROL-XL) 25 MG 24 hr tablet Take 25 mg by mouth daily.      . nitroGLYCERIN (NITROSTAT) 0.4 MG SL tablet Place 1 tablet (0.4 mg total) under the tongue every 5 (five) minutes as needed for chest pain. 90 tablet 3  . NONFORMULARY OR COMPOUNDED ITEM [facility]:  Peripheral Neuropathy Cream - Bupivacaine 1%, doxepin 3%, Gabapentin 6%, Pentoxifylline 3%, Topiramate 1%, apply 1-2 grams to affected area 3-4 times daily. 120 each 3  . pantoprazole (PROTONIX) 40 MG tablet  Take 1 tablet (40 mg total) by mouth daily. 30 tablet 0   No current facility-administered medications for this visit.    Allergies  Allergen Reactions  . Ace Inhibitors   . Nitrates, Organic   . Penicillins Rash    Past Medical History:  Diagnosis Date  . Cancer ([place])   . Chronic kidney disease    kidney stones  . Coronary artery disease    with CABG in 2006 with LIMA to LAD, SVG to mid and distal OM, SVG to AM. Normal Myoview in May of 2013  . Diabetes mellitus    Type 2  . GERD (gastroesophageal reflux disease)   . Hematuria   . Hypercholesterolemia   . Hypertension   . Irritable bowel syndrome   . Loose stools   . Prostate cancer ([place])   . Urinary obstruction     Past Surgical History:  Procedure Laterality Date  .  CHOLECYSTECTOMY    . CORONARY ARTERY BYPASS GRAFT  2006  . LEFT Choi CATHETERIZATION WITH CORONARY/GRAFT ANGIOGRAM N/A 04/11/2014   Procedure: LEFT Choi CATHETERIZATION WITH [name];  Surgeon: Cathaleen Korol M [name], MD;  Location: [facility] CATH LAB;  Service: Cardiovascular;  Laterality: N/A;  . OTHER SURGICAL HISTORY     bowel obstruction  . RADIOACTIVE SEED IMPLANT     for prostate cancer  . SKIN CANCER EXCISION      Social History   Tobacco Use  Smoking Status Former Smoker  . Packs/day: 3.00  Smokeless Tobacco Never Used    Social History   Substance and Sexual Activity  Alcohol Use No    Family History  Problem Relation Age of Onset  . Cancer Mother        Leukemia  . Hypertension Mother   . Cancer Father        Colon  . Choi attack Father        Had Several in his 54's  . Hypertension Father   . Diabetes Father     Review of Systems: The review of systems is per the HPI.  All other systems were reviewed and are negative.  Physical Exam: There were no vitals taken for this visit. GENERAL:  Well appearing elderly WM HEENT:  PERRL, EOMI, sclera are clear. Oropharynx is clear. NECK:  No jugular venous distention, carotid upstroke brisk and symmetric, no bruits, no thyromegaly or adenopathy LUNGS:  Clear to auscultation bilaterally CHEST:  Unremarkable Choi:  RRR,  PMI not displaced or sustained,S1 and S2 within normal limits, no S3, no S4: no clicks, no rubs, no murmurs ABD:  Soft, nontender. BS +, no masses or bruits. No hepatomegaly, no splenomegaly EXT:  2 + pulses throughout, no edema, no cyanosis no clubbing. Brace on left ankle.  SKIN:  Warm and dry.  No rashes NEURO:  Alert and oriented x 3. Cranial nerves II through XII intact. PSYCH:  Cognitively intact   Wt Readings from Last 3 Encounters:  11/21/19 183 lb (83 kg)  07/16/19 199 lb 12.8 oz (90.6 kg)  03/05/18 202 lb 9.6 oz (91.9 kg)    LABORATORY DATA/PROCEDURES:  Lab Results    Component Value Date   WBC 7.4 09/17/2019   HGB 12.2 (L) 09/17/2019   HCT 36.8 (L) 09/17/2019   PLT 198 09/17/2019   GLUCOSE 104 (H) 09/17/2019   NA 137 09/17/2019   K 3.5 09/17/2019   CL 95 (L) 09/17/2019   CREATININE 1.08 09/17/2019   BUN 17 09/17/2019   CO2 30  09/17/2019   INR 0.96 04/08/2014    BNP (last 3 results) No results for input(s): PROBNP in the last 8760 hours.  Labs reviewed from 01/04/16: cholesterol 123, triglycerides 151, HDL 36, LDL 57. CBC and CMET normal.  Dated 07/08/16: cholesterol 116, triglycerides 205, HDL 30, LDL 45. CMET and CBC normal. Dated 01/18/17: Hgb 12.2. CMET normal. Cholesterol 105, triglycerides 152, HDL 28, LDL 47. A1c 6.3%. Dated 07/25/17: Hgb 12.5. Glucose 124. Other chemistries normal. Cholesterol 107, Triglycerides 139, HDL 29, LDL 50. A1c 7.2%. Iron studies normal.  Dated 5.1.19: cholesterol 107, triglycerides 109, HDL 29, LDL 56. A1c 7.5%. CBC and chemistries normal.  Dated 05/29/19: cholesterol 142, triglycerides 207, HDL 33, LDL 54. A1c 6.9%. CBC and CMET normal. Dated 12/03/19: Hgb 12. A1c 6.7%. otherwise CBC and CMET normal. Iron studies normal. Cholesterol 122, triglycerides 118, HDL 35, LDL 65.    Assessment / Plan: 1. CAD- s/p CABG 2006. He is asymptomatic.  Grafts are patent by cath in 2015. SVG to OM1, SVG to RV marginal branch and LIMA to LAD. He did have complex severe disease in the LCx and in the mid RCA that were not grafted. While his current symptoms sound a lot like indigestion he has not responded to aggressive antireflux therapy and recent EGD was not that revealing. I reviewed his prior cath films. PCI of the LCx and RCA would be quite complex and require atherectomy. I have recommended proceeding with [facility] with grafts. If stable from before may need to consider staged PCI of the RCA and LCx.  Will continue current antianginal therapy  2. Hyperlipidemia. On statin. Excellent control.  3. DM type 2. A1c is improved.   4. HTN  - well controlled.   5. Bowel obstruction s/p surgery.   6. S/p left shoulder and carpel tunnel surgery.   7. GERD

## 2019-12-06 NOTE — Progress Notes (Signed)
Alexander Choi Date of Birth: 03-01-1933 Medical Record #[id number]  History of Present Illness: Alexander Choi is seen for follow up CAD. He has known CAD with prior CABG in 10/17/2004, negative Myoview in October 2015 showed inferior ischemia. Cardiac cath showed all grafts patent. There was severe disease in the native LCx that predated his CABG. Other issues include DM, HTN and HLD.   On follow up today he is doing well from a cardiac standpoint.  He denies any dyspnea or chest pain. He is taking Tagamet for some heartburn. His wife passed away in 10-17-2017. He was admitted to [facility] in October 2020 with bowel obstruction. He had surgery with lysis of adhesions. Was in hospital for 16 days. One week later he fell in his garage and reinjured his shoulder. Had shoulder surgery in December.   He was seen in the ED in 18-Oct-2022 with dyspnea and symptoms of severe indigestion. He has been aggressively treated for indigestion with Tagamet and Protonix without improvement. Notes he had EGD 2 months ago that showed only mild inflammation. States symptoms are worse after eating a meal but also occur with activity. SOB is definitely worse with activity. States he can only swing a gold club 5-6 times before he has to stop. Doesn't take Ntg because it makes him sick. Notes some discomfort in his left arm but has also had carpel tunnel and shoulder surgery done this past year.   He was seen in May by Almyra Deforest PA-C for symptoms more consistent with acid reflux. PPI recommended.   Current Outpatient Medications  Medication Sig Dispense Refill  . Accu-Chek Softclix Lancets lancets     . Accu-Chek Softclix Lancets lancets     . Acetaminophen (TYLENOL ARTHRITIS PAIN PO) Take 2 tablets by mouth daily.     Marland Kitchen amLODipine (NORVASC) 2.5 MG tablet Take 1 tablet (2.5 mg total) by mouth daily. 180 tablet 1  . aspirin 81 MG tablet Take 81 mg by mouth daily.      Marland Kitchen atorvastatin (LIPITOR) 40 MG tablet Take 40 mg by mouth  daily.    . cimetidine (TAGAMET) 400 MG tablet Take 400 mg by mouth 2 (two) times daily.    . cyanocobalamin 100 MCG tablet Take 100 mcg by mouth daily. 2 capsules daily    . dicyclomine (BENTYL) 10 MG capsule     . gabapentin (NEURONTIN) 100 MG capsule Take 100 mg by mouth 3 (three) times daily.    Marland Kitchen glimepiride (AMARYL) 4 MG tablet     . glucose blood (ACCU-CHEK AVIVA PLUS) test strip     . Hyoscyamine Sulfate SL 0.125 MG SUBL DISSOLVE 1 TABLET IN MOUTH EVERY 4 TO 6 HOURS AS NEEDED FOR DIARRHEA    . losartan (COZAAR) 100 MG tablet TAKE 1 TABLET BY MOUTH ONCE DAILY    . metFORMIN (GLUCOPHAGE) 500 MG tablet Take 500 mg by mouth 2 (two) times daily with a meal.     . metoprolol succinate (TOPROL-XL) 25 MG 24 hr tablet Take 25 mg by mouth daily.      . nitroGLYCERIN (NITROSTAT) 0.4 MG SL tablet Place 1 tablet (0.4 mg total) under the tongue every 5 (five) minutes as needed for chest pain. 90 tablet 3  . NONFORMULARY OR COMPOUNDED ITEM [facility]:  Peripheral Neuropathy Cream - Bupivacaine 1%, doxepin 3%, Gabapentin 6%, Pentoxifylline 3%, Topiramate 1%, apply 1-2 grams to affected area 3-4 times daily. 120 each 3  . pantoprazole (PROTONIX) 40 MG tablet  Take 1 tablet (40 mg total) by mouth daily. 30 tablet 0   No current facility-administered medications for this visit.    Allergies  Allergen Reactions  . Ace Inhibitors   . Nitrates, Organic   . Penicillins Rash    Past Medical History:  Diagnosis Date  . Cancer ([place])   . Chronic kidney disease    kidney stones  . Coronary artery disease    with CABG in 2006 with LIMA to LAD, SVG to mid and distal OM, SVG to AM. Normal Myoview in May of 2013  . Diabetes mellitus    Type 2  . GERD (gastroesophageal reflux disease)   . Hematuria   . Hypercholesterolemia   . Hypertension   . Irritable bowel syndrome   . Loose stools   . Prostate cancer ([place])   . Urinary obstruction     Past Surgical History:  Procedure Laterality Date  .  CHOLECYSTECTOMY    . CORONARY ARTERY BYPASS GRAFT  2006  . LEFT HEART CATHETERIZATION WITH CORONARY/GRAFT ANGIOGRAM N/A 04/11/2014   Procedure: LEFT HEART CATHETERIZATION WITH [name];  Surgeon: Margarethe Virgen M [name], MD;  Location: [facility] CATH LAB;  Service: Cardiovascular;  Laterality: N/A;  . OTHER SURGICAL HISTORY     bowel obstruction  . RADIOACTIVE SEED IMPLANT     for prostate cancer  . SKIN CANCER EXCISION      Social History   Tobacco Use  Smoking Status Former Smoker  . Packs/day: 3.00  Smokeless Tobacco Never Used    Social History   Substance and Sexual Activity  Alcohol Use No    Family History  Problem Relation Age of Onset  . Cancer Mother        Leukemia  . Hypertension Mother   . Cancer Father        Colon  . Heart attack Father        Had Several in his 63's  . Hypertension Father   . Diabetes Father     Review of Systems: The review of systems is per the HPI.  All other systems were reviewed and are negative.  Physical Exam: There were no vitals taken for this visit. GENERAL:  Well appearing elderly WM HEENT:  PERRL, EOMI, sclera are clear. Oropharynx is clear. NECK:  No jugular venous distention, carotid upstroke brisk and symmetric, no bruits, no thyromegaly or adenopathy LUNGS:  Clear to auscultation bilaterally CHEST:  Unremarkable HEART:  RRR,  PMI not displaced or sustained,S1 and S2 within normal limits, no S3, no S4: no clicks, no rubs, no murmurs ABD:  Soft, nontender. BS +, no masses or bruits. No hepatomegaly, no splenomegaly EXT:  2 + pulses throughout, no edema, no cyanosis no clubbing. Brace on left ankle.  SKIN:  Warm and dry.  No rashes NEURO:  Alert and oriented x 3. Cranial nerves II through XII intact. PSYCH:  Cognitively intact   Wt Readings from Last 3 Encounters:  11/21/19 183 lb (83 kg)  07/16/19 199 lb 12.8 oz (90.6 kg)  03/05/18 202 lb 9.6 oz (91.9 kg)    LABORATORY DATA/PROCEDURES:  Lab Results   Component Value Date   WBC 7.4 09/17/2019   HGB 12.2 (L) 09/17/2019   HCT 36.8 (L) 09/17/2019   PLT 198 09/17/2019   GLUCOSE 104 (H) 09/17/2019   NA 137 09/17/2019   K 3.5 09/17/2019   CL 95 (L) 09/17/2019   CREATININE 1.08 09/17/2019   BUN 17 09/17/2019   CO2 30 09/17/2019  INR 0.96 04/08/2014    BNP (last 3 results) No results for input(s): PROBNP in the last 8760 hours.  Labs reviewed from 01/04/16: cholesterol 123, triglycerides 151, HDL 36, LDL 57. CBC and CMET normal.  Dated 07/08/16: cholesterol 116, triglycerides 205, HDL 30, LDL 45. CMET and CBC normal. Dated 01/18/17: Hgb 12.2. CMET normal. Cholesterol 105, triglycerides 152, HDL 28, LDL 47. A1c 6.3%. Dated 07/25/17: Hgb 12.5. Glucose 124. Other chemistries normal. Cholesterol 107, Triglycerides 139, HDL 29, LDL 50. A1c 7.2%. Iron studies normal.  Dated 5.1.19: cholesterol 107, triglycerides 109, HDL 29, LDL 56. A1c 7.5%. CBC and chemistries normal.  Dated 05/29/19: cholesterol 142, triglycerides 207, HDL 33, LDL 54. A1c 6.9%. CBC and CMET normal. Dated 12/03/19: Hgb 12. A1c 6.7%. otherwise CBC and CMET normal. Iron studies normal. Cholesterol 122, triglycerides 118, HDL 35, LDL 65.    Assessment / Plan: 1. CAD- s/p CABG 2006. He is asymptomatic.  Grafts are patent by cath in 2015. SVG to OM1, SVG to RV marginal branch and LIMA to LAD. He did have complex severe disease in the LCx and in the mid RCA that were not grafted. While his current symptoms sound a lot like indigestion he has not responded to aggressive antireflux therapy and recent EGD was not that revealing. I reviewed his prior cath films. PCI of the LCx and RCA would be quite complex and require atherectomy. I have recommended proceeding with [facility] with grafts. If stable from before may need to consider staged PCI of the RCA and LCx.  Will continue current antianginal therapy  2. Hyperlipidemia. On statin. Excellent control.  3. DM type 2. A1c is improved.   4. HTN  - well controlled.   5. Bowel obstruction s/p surgery.   6. S/p left shoulder and carpel tunnel surgery.   7. GERD

## 2019-12-09 ENCOUNTER — Other Ambulatory Visit: Payer: Self-pay

## 2019-12-09 ENCOUNTER — Ambulatory Visit: Payer: Medicare PPO | Admitting: Cardiology

## 2019-12-09 ENCOUNTER — Encounter: Payer: Self-pay | Admitting: Cardiology

## 2019-12-09 ENCOUNTER — Other Ambulatory Visit: Payer: Self-pay | Admitting: Cardiology

## 2019-12-09 VITALS — BP 152/68 | HR 58 | Ht 70.0 in | Wt 185.2 lb

## 2019-12-09 DIAGNOSIS — E785 Hyperlipidemia, unspecified: Secondary | ICD-10-CM

## 2019-12-09 DIAGNOSIS — E7800 Pure hypercholesterolemia, unspecified: Secondary | ICD-10-CM | POA: Diagnosis not present

## 2019-12-09 DIAGNOSIS — I200 Unstable angina: Secondary | ICD-10-CM

## 2019-12-09 DIAGNOSIS — E119 Type 2 diabetes mellitus without complications: Secondary | ICD-10-CM

## 2019-12-09 DIAGNOSIS — I25709 Atherosclerosis of coronary artery bypass graft(s), unspecified, with unspecified angina pectoris: Secondary | ICD-10-CM | POA: Diagnosis not present

## 2019-12-09 MED ORDER — SODIUM CHLORIDE 0.9% FLUSH: 3.0000 mL | Freq: Two times a day (BID) | INTRAVENOUS | Status: DC

## 2019-12-09 NOTE — Patient Instructions (Addendum)
Medication Instructions:  Continue same medications *If you need a refill on your cardiac medications before your next appointment, please call your pharmacy*   Lab Work: No Labs needed recent lab 6/8 with PCP    Testing/Procedures: Cardiac Cath scheduled Thurs 6/17 follow instructions below   Follow-Up: At [facility], you and your health needs are our priority.  As part of our continuing mission to provide you with exceptional heart care, we have created designated Provider Care Teams.  These Care Teams include your primary Cardiologist (physician) and Advanced Practice Providers (APPs -  Physician Assistants and Nurse Practitioners) who all work together to provide you with the care you need, when you need it.  We recommend signing up for the patient portal called "MyChart".  Sign up information is provided on this After Visit Summary.  MyChart is used to connect with patients for Virtual Visits (Telemedicine).  Patients are able to view lab/test results, encounter notes, upcoming appointments, etc.  Non-urgent messages can be sent to your provider as well.   To learn more about what you can do with MyChart, go to [email or web address].    Your next appointment:  Tuesday 01/07/20 at 4:30 pm   The format for your next appointment: Office    Provider:  [place] [address] Dept: [phone] Loc: [place]  12/09/2019  You are scheduled for a Cath on Thursday 12/12/19 with Dr.Jordan.  1. Please arrive at the [facility] (Main Entrance A) at [facility]: [address] [place], [place] [address] at 7:00 am (This time is two hours before your procedure to ensure your preparation). Free valet parking service is available.   Special note: Every effort is made to have your procedure done on time. Please understand  that emergencies sometimes delay scheduled procedures.  2. Diet: Do not eat solid foods after midnight.  The patient may have clear liquids until 5am upon the day of the procedure.  3. Labs: No Labs needed Recent lab with PCP.  4. Medication instructions in preparation for your procedure:   Hold Metformin day before cath and hold morning of cath.Hold 2 days after cath.  Hold Glimepiride morning of cath.     On the morning of your procedure, take your Aspirin 81 mg.and any morning medicines NOT listed above.  You may use sips of water.  5. Plan for one night stay--bring personal belongings. 6. Bring a current list of your medications and current insurance cards. 7. You MUST have a responsible person to drive you home. 8. Someone MUST be with you the first 24 hours after you arrive home or your discharge will be delayed. 9. Please wear clothes that are easy to get on and off and wear slip-on shoes.  Thank you for allowing [place] to care for you!   -- [facility]

## 2019-12-11 ENCOUNTER — Telehealth: Payer: Self-pay | Admitting: *Deleted

## 2019-12-11 ENCOUNTER — Encounter: Payer: Self-pay | Admitting: *Deleted

## 2019-12-11 NOTE — Telephone Encounter (Addendum)
Pt contacted pre-catheterization scheduled at [facility] for: Thursday December 12, 2019 9 AM Verified arrival time and place: [facility]) at: 7 AM   No solid food after midnight prior to cath, clear liquids until 5 AM day of procedure.  Hold: Glimepiride-AM of procedure  Metformin-day of procedure and 48 hours post procedure.  Except hold medications AM meds can be  taken pre-cath with sips of water including: ASA 81 mg   Confirmed patient has responsible adult to drive home post procedure and observe 24 hours after arriving home: yes  You are allowed ONE visitor in the waiting room during your procedure. Both you and your visitor must wear masks.      COVID-19 Pre-Screening Questions:  . In the past 7 to 10 days have you had a new cough, shortness of breath, headache, congestion, fever (100 or greater) unexplained body aches, new sore throat, or sudden loss of taste or sense of smell? no . In the past 7 to 10 days have you been around anyone with known Covid 19? no   Reviewed procedure/mask/visitor instructions, COVID-19 screening questions reviewed with patient.

## 2019-12-12 ENCOUNTER — Inpatient Hospital Stay (HOSPITAL_COMMUNITY)
Admission: AD | Admit: 2019-12-12 | Discharge: 2019-12-16 | DRG: 287 | Disposition: A | Discharge status: Home / Self Care | Payer: Medicare PPO | Attending: Cardiology | Admitting: Cardiology

## 2019-12-12 ENCOUNTER — Encounter (HOSPITAL_COMMUNITY)
Admission: AD | Disposition: A | Discharge status: Home / Self Care | Payer: Self-pay | Source: Home / Self Care | Attending: Cardiology

## 2019-12-12 ENCOUNTER — Other Ambulatory Visit: Payer: Self-pay

## 2019-12-12 ENCOUNTER — Encounter (HOSPITAL_COMMUNITY): Payer: Self-pay | Admitting: Cardiology

## 2019-12-12 DIAGNOSIS — J90 Pleural effusion, not elsewhere classified: Secondary | ICD-10-CM | POA: Diagnosis not present

## 2019-12-12 DIAGNOSIS — Z79899 Other long term (current) drug therapy: Secondary | ICD-10-CM

## 2019-12-12 DIAGNOSIS — Z7982 Long term (current) use of aspirin: Secondary | ICD-10-CM | POA: Diagnosis not present

## 2019-12-12 DIAGNOSIS — E785 Hyperlipidemia, unspecified: Secondary | ICD-10-CM | POA: Diagnosis present

## 2019-12-12 DIAGNOSIS — Z951 Presence of aortocoronary bypass graft: Secondary | ICD-10-CM

## 2019-12-12 DIAGNOSIS — Z8546 Personal history of malignant neoplasm of prostate: Secondary | ICD-10-CM

## 2019-12-12 DIAGNOSIS — I2510 Atherosclerotic heart disease of native coronary artery without angina pectoris: Secondary | ICD-10-CM | POA: Diagnosis present

## 2019-12-12 DIAGNOSIS — I200 Unstable angina: Secondary | ICD-10-CM | POA: Diagnosis not present

## 2019-12-12 DIAGNOSIS — K219 Gastro-esophageal reflux disease without esophagitis: Secondary | ICD-10-CM | POA: Diagnosis not present

## 2019-12-12 DIAGNOSIS — I25110 Atherosclerotic heart disease of native coronary artery with unstable angina pectoris: Secondary | ICD-10-CM | POA: Diagnosis not present

## 2019-12-12 DIAGNOSIS — Z87891 Personal history of nicotine dependence: Secondary | ICD-10-CM

## 2019-12-12 DIAGNOSIS — Z85828 Personal history of other malignant neoplasm of skin: Secondary | ICD-10-CM | POA: Diagnosis not present

## 2019-12-12 DIAGNOSIS — Z9049 Acquired absence of other specified parts of digestive tract: Secondary | ICD-10-CM | POA: Diagnosis not present

## 2019-12-12 DIAGNOSIS — Z806 Family history of leukemia: Secondary | ICD-10-CM

## 2019-12-12 DIAGNOSIS — I2584 Coronary atherosclerosis due to calcified coronary lesion: Secondary | ICD-10-CM | POA: Diagnosis present

## 2019-12-12 DIAGNOSIS — Z833 Family history of diabetes mellitus: Secondary | ICD-10-CM | POA: Diagnosis not present

## 2019-12-12 DIAGNOSIS — R001 Bradycardia, unspecified: Secondary | ICD-10-CM | POA: Diagnosis present

## 2019-12-12 DIAGNOSIS — I2583 Coronary atherosclerosis due to lipid rich plaque: Secondary | ICD-10-CM | POA: Diagnosis not present

## 2019-12-12 DIAGNOSIS — I25119 Atherosclerotic heart disease of native coronary artery with unspecified angina pectoris: Secondary | ICD-10-CM

## 2019-12-12 DIAGNOSIS — E1142 Type 2 diabetes mellitus with diabetic polyneuropathy: Secondary | ICD-10-CM | POA: Diagnosis present

## 2019-12-12 DIAGNOSIS — Z8249 Family history of ischemic heart disease and other diseases of the circulatory system: Secondary | ICD-10-CM

## 2019-12-12 DIAGNOSIS — Z538 Procedure and treatment not carried out for other reasons: Secondary | ICD-10-CM | POA: Diagnosis not present

## 2019-12-12 DIAGNOSIS — I10 Essential (primary) hypertension: Secondary | ICD-10-CM | POA: Diagnosis not present

## 2019-12-12 DIAGNOSIS — E119 Type 2 diabetes mellitus without complications: Secondary | ICD-10-CM

## 2019-12-12 DIAGNOSIS — Z7984 Long term (current) use of oral hypoglycemic drugs: Secondary | ICD-10-CM | POA: Diagnosis not present

## 2019-12-12 DIAGNOSIS — Z87442 Personal history of urinary calculi: Secondary | ICD-10-CM

## 2019-12-12 DIAGNOSIS — J984 Other disorders of lung: Secondary | ICD-10-CM | POA: Diagnosis not present

## 2019-12-12 DIAGNOSIS — I495 Sick sinus syndrome: Secondary | ICD-10-CM | POA: Diagnosis not present

## 2019-12-12 DIAGNOSIS — I471 Supraventricular tachycardia: Secondary | ICD-10-CM | POA: Diagnosis present

## 2019-12-12 DIAGNOSIS — E7800 Pure hypercholesterolemia, unspecified: Secondary | ICD-10-CM | POA: Diagnosis present

## 2019-12-12 HISTORY — PX: RIGHT/LEFT HEART CATH AND CORONARY/GRAFT ANGIOGRAPHY: CATH118267

## 2019-12-12 LAB — POCT I-STAT EG7
Acid-Base Excess: 4 mmol/L — ABNORMAL HIGH (ref 0.0–2.0)
Bicarbonate: 30.9 mmol/L — ABNORMAL HIGH (ref 20.0–28.0)
Calcium, Ion: 1.21 mmol/L (ref 1.15–1.40)
HCT: 33 % — ABNORMAL LOW (ref 39.0–52.0)
Hemoglobin: 11.2 g/dL — ABNORMAL LOW (ref 13.0–17.0)
O2 Saturation: 76 %
Potassium: 3.8 mmol/L (ref 3.5–5.1)
Sodium: 143 mmol/L (ref 135–145)
TCO2: 33 mmol/L — ABNORMAL HIGH (ref 22–32)
pCO2, Ven: 57.7 mmHg (ref 44.0–60.0)
pH, Ven: 7.337 (ref 7.250–7.430)
pO2, Ven: 45 mmHg (ref 32.0–45.0)

## 2019-12-12 LAB — POCT I-STAT 7, (LYTES, BLD GAS, ICA,H+H)
Acid-Base Excess: 4 mmol/L — ABNORMAL HIGH (ref 0.0–2.0)
Bicarbonate: 30 mmol/L — ABNORMAL HIGH (ref 20.0–28.0)
Calcium, Ion: 1.22 mmol/L (ref 1.15–1.40)
HCT: 32 % — ABNORMAL LOW (ref 39.0–52.0)
Hemoglobin: 10.9 g/dL — ABNORMAL LOW (ref 13.0–17.0)
O2 Saturation: 99 %
Potassium: 3.8 mmol/L (ref 3.5–5.1)
Sodium: 143 mmol/L (ref 135–145)
TCO2: 32 mmol/L (ref 22–32)
pCO2 arterial: 52.4 mmHg — ABNORMAL HIGH (ref 32.0–48.0)
pH, Arterial: 7.365 (ref 7.350–7.450)
pO2, Arterial: 135 mmHg — ABNORMAL HIGH (ref 83.0–108.0)

## 2019-12-12 LAB — GLUCOSE, CAPILLARY
Glucose-Capillary: 100 mg/dL — ABNORMAL HIGH (ref 70–99)
Glucose-Capillary: 84 mg/dL (ref 70–99)
Glucose-Capillary: 83 mg/dL (ref 70–99)
Glucose-Capillary: 94 mg/dL (ref 70–99)
Glucose-Capillary: 152 mg/dL — ABNORMAL HIGH (ref 70–99)

## 2019-12-12 LAB — HEPARIN LEVEL (UNFRACTIONATED): Heparin Unfractionated: 0.28 IU/mL — ABNORMAL LOW (ref 0.30–0.70)

## 2019-12-12 SURGERY — RIGHT/LEFT HEART CATH AND CORONARY/GRAFT ANGIOGRAPHY
Anesthesia: LOCAL

## 2019-12-12 MED ORDER — ACETAMINOPHEN 325 MG PO TABS: 650.0000 mg | ORAL_TABLET | ORAL | Status: DC | PRN

## 2019-12-12 MED ORDER — ASPIRIN EC 81 MG PO TBEC
81.0000 mg | DELAYED_RELEASE_TABLET | Freq: Every day | ORAL | Status: DC
  Administered 2019-12-13 – 2019-12-16 (×4): 81 mg via ORAL
  Filled 2019-12-12 (×5): qty 1

## 2019-12-12 MED ORDER — FAMOTIDINE 20 MG PO TABS
20.0000 mg | ORAL_TABLET | Freq: Every day | ORAL | Status: DC
  Administered 2019-12-13 – 2019-12-16 (×4): 20 mg via ORAL
  Filled 2019-12-12 (×4): qty 1

## 2019-12-12 MED ORDER — HYDRALAZINE HCL 20 MG/ML IJ SOLN: 10.0000 mg | INTRAMUSCULAR | Status: AC | PRN

## 2019-12-12 MED ORDER — ONDANSETRON HCL 4 MG/2ML IJ SOLN
4.0000 mg | Freq: Four times a day (QID) | INTRAMUSCULAR | Status: DC | PRN
  Administered 2019-12-13 (×2): 4 mg via INTRAVENOUS
  Filled 2019-12-12 (×2): qty 2

## 2019-12-12 MED ORDER — SODIUM CHLORIDE 0.9% FLUSH: 3.0000 mL | Freq: Two times a day (BID) | INTRAVENOUS | Status: DC

## 2019-12-12 MED ORDER — METOPROLOL SUCCINATE ER 25 MG PO TB24
25.0000 mg | ORAL_TABLET | Freq: Every day | ORAL | Status: DC
  Administered 2019-12-14: 25 mg via ORAL
  Filled 2019-12-12: qty 1

## 2019-12-12 MED ORDER — HEPARIN SODIUM (PORCINE) 1000 UNIT/ML IJ SOLN
INTRAMUSCULAR | Status: DC | PRN
  Administered 2019-12-12: 4000 [IU] via INTRAVENOUS

## 2019-12-12 MED ORDER — NITROGLYCERIN 0.4 MG SL SUBL: 0.4000 mg | SUBLINGUAL_TABLET | SUBLINGUAL | Status: DC | PRN

## 2019-12-12 MED ORDER — LIDOCAINE HCL (PF) 1 % IJ SOLN
INTRAMUSCULAR | Status: AC
  Filled 2019-12-12: qty 30

## 2019-12-12 MED ORDER — AMLODIPINE BESYLATE 2.5 MG PO TABS: 2.5000 mg | ORAL_TABLET | Freq: Every day | ORAL | Status: DC

## 2019-12-12 MED ORDER — SODIUM CHLORIDE 0.9 % WEIGHT BASED INFUSION: 1.0000 mL/kg/h | INTRAVENOUS | Status: DC

## 2019-12-12 MED ORDER — GABAPENTIN 100 MG PO CAPS
100.0000 mg | ORAL_CAPSULE | Freq: Two times a day (BID) | ORAL | Status: DC
  Administered 2019-12-12 – 2019-12-16 (×8): 100 mg via ORAL
  Filled 2019-12-12 (×8): qty 1

## 2019-12-12 MED ORDER — VERAPAMIL HCL 2.5 MG/ML IV SOLN
INTRAVENOUS | Status: DC | PRN
  Administered 2019-12-12: 10 mL via INTRA_ARTERIAL

## 2019-12-12 MED ORDER — HEPARIN (PORCINE) IN NACL 1000-0.9 UT/500ML-% IV SOLN
INTRAVENOUS | Status: AC
  Filled 2019-12-12: qty 1000

## 2019-12-12 MED ORDER — MIDAZOLAM HCL 2 MG/2ML IJ SOLN
INTRAMUSCULAR | Status: AC
  Filled 2019-12-12: qty 2

## 2019-12-12 MED ORDER — HEPARIN (PORCINE) IN NACL 1000-0.9 UT/500ML-% IV SOLN
INTRAVENOUS | Status: DC | PRN
  Administered 2019-12-12 (×2): 500 mL

## 2019-12-12 MED ORDER — MIDAZOLAM HCL 2 MG/2ML IJ SOLN
INTRAMUSCULAR | Status: DC | PRN
  Administered 2019-12-12: 1 mg via INTRAVENOUS

## 2019-12-12 MED ORDER — SODIUM CHLORIDE 0.9% FLUSH: 3.0000 mL | INTRAVENOUS | Status: DC | PRN

## 2019-12-12 MED ORDER — INSULIN ASPART 100 UNIT/ML ~~LOC~~ SOLN
0.0000 [IU] | Freq: Three times a day (TID) | SUBCUTANEOUS | Status: DC
  Administered 2019-12-14 – 2019-12-15 (×2): 2 [IU] via SUBCUTANEOUS
  Administered 2019-12-15: 3 [IU] via SUBCUTANEOUS
  Administered 2019-12-16: 2 [IU] via SUBCUTANEOUS

## 2019-12-12 MED ORDER — LOSARTAN POTASSIUM 50 MG PO TABS
100.0000 mg | ORAL_TABLET | Freq: Every day | ORAL | Status: DC
  Administered 2019-12-13 – 2019-12-16 (×4): 100 mg via ORAL
  Filled 2019-12-12 (×4): qty 2

## 2019-12-12 MED ORDER — SODIUM CHLORIDE 0.9 % WEIGHT BASED INFUSION: 3.0000 mL/kg/h | INTRAVENOUS | Status: DC

## 2019-12-12 MED ORDER — ASPIRIN 81 MG PO CHEW: 81.0000 mg | CHEWABLE_TABLET | ORAL | Status: DC

## 2019-12-12 MED ORDER — SODIUM CHLORIDE 0.9 % WEIGHT BASED INFUSION
1.0000 mL/kg/h | INTRAVENOUS | Status: AC
  Administered 2019-12-12: 1 mL/kg/h via INTRAVENOUS

## 2019-12-12 MED ORDER — SODIUM CHLORIDE 0.9 % IV SOLN: 250.0000 mL | INTRAVENOUS | Status: DC | PRN

## 2019-12-12 MED ORDER — HEPARIN (PORCINE) 25000 UT/250ML-% IV SOLN
1200.0000 [IU]/h | INTRAVENOUS | Status: DC
  Administered 2019-12-12: 1100 [IU]/h via INTRAVENOUS
  Filled 2019-12-12: qty 250

## 2019-12-12 MED ORDER — HEPARIN SODIUM (PORCINE) 1000 UNIT/ML IJ SOLN
INTRAMUSCULAR | Status: AC
  Filled 2019-12-12: qty 1

## 2019-12-12 MED ORDER — LIDOCAINE HCL (PF) 1 % IJ SOLN
INTRAMUSCULAR | Status: DC | PRN
  Administered 2019-12-12 (×2): 2 mL

## 2019-12-12 MED ORDER — FENTANYL CITRATE (PF) 100 MCG/2ML IJ SOLN
INTRAMUSCULAR | Status: AC
  Filled 2019-12-12: qty 2

## 2019-12-12 MED ORDER — GLIMEPIRIDE 1 MG PO TABS
4.0000 mg | ORAL_TABLET | Freq: Every day | ORAL | Status: DC
  Administered 2019-12-14 – 2019-12-16 (×3): 4 mg via ORAL
  Filled 2019-12-12 (×3): qty 4

## 2019-12-12 MED ORDER — CLOPIDOGREL BISULFATE 75 MG PO TABS
600.0000 mg | ORAL_TABLET | Freq: Once | ORAL | Status: AC
  Administered 2019-12-12: 600 mg via ORAL
  Filled 2019-12-12: qty 8

## 2019-12-12 MED ORDER — ATORVASTATIN CALCIUM 40 MG PO TABS: 40.0000 mg | ORAL_TABLET | Freq: Every day | ORAL | Status: DC

## 2019-12-12 MED ORDER — CLOPIDOGREL BISULFATE 75 MG PO TABS
75.0000 mg | ORAL_TABLET | Freq: Every day | ORAL | Status: DC
  Administered 2019-12-13: 75 mg via ORAL
  Filled 2019-12-12: qty 1

## 2019-12-12 MED ORDER — VERAPAMIL HCL 2.5 MG/ML IV SOLN
INTRAVENOUS | Status: AC
  Filled 2019-12-12: qty 2

## 2019-12-12 MED ORDER — PANTOPRAZOLE SODIUM 40 MG PO TBEC
40.0000 mg | DELAYED_RELEASE_TABLET | Freq: Two times a day (BID) | ORAL | Status: DC
  Administered 2019-12-12 – 2019-12-16 (×8): 40 mg via ORAL
  Filled 2019-12-12 (×8): qty 1

## 2019-12-12 MED ORDER — FENTANYL CITRATE (PF) 100 MCG/2ML IJ SOLN
INTRAMUSCULAR | Status: DC | PRN
  Administered 2019-12-12: 25 ug via INTRAVENOUS

## 2019-12-12 MED ORDER — IOHEXOL 350 MG/ML SOLN
INTRAVENOUS | Status: DC | PRN
  Administered 2019-12-12: 145 mL

## 2019-12-12 SURGICAL SUPPLY — 14 items
CATH BALLN WEDGE 5F 110CM (CATHETERS) ×2 IMPLANT
CATH INFINITI 5 FR AR1 MOD (CATHETERS) ×2 IMPLANT
CATH INFINITI 5FR AL1 (CATHETERS) ×2 IMPLANT
CATH INFINITI 5FR MULTPACK ANG (CATHETERS) ×2 IMPLANT
DEVICE RAD COMP TR BAND LRG (VASCULAR PRODUCTS) ×2 IMPLANT
GLIDESHEATH SLEND SS 6F .021 (SHEATH) ×2 IMPLANT
GUIDEWIRE INQWIRE 1.5J.035X260 (WIRE) ×1 IMPLANT
INQWIRE 1.5J .035X260CM (WIRE) ×2
KIT HEART LEFT (KITS) ×2 IMPLANT
PACK CARDIAC CATHETERIZATION (CUSTOM PROCEDURE TRAY) ×2 IMPLANT
SHEATH GLIDE SLENDER 4/5FR (SHEATH) ×2 IMPLANT
SYR MEDRAD MARK 7 150ML (SYRINGE) ×2 IMPLANT
TRANSDUCER W/STOPCOCK (MISCELLANEOUS) ×2 IMPLANT
TUBING CIL FLEX 10 FLL-RA (TUBING) ×2 IMPLANT

## 2019-12-12 NOTE — Interval H&P Note (Signed)
History and Physical Interval Note:  12/12/2019 8:59 AM  Alexander Choi  has presented today for surgery, with the diagnosis of CAD.  The various methods of treatment have been discussed with the patient and family. After consideration of risks, benefits and other options for treatment, the patient has consented to  Procedure(s): RIGHT/LEFT HEART CATH AND CORONARY/GRAFT ANGIOGRAPHY (N/A) as a surgical intervention.  The patient's history has been reviewed, patient examined, no change in status, stable for surgery.  I have reviewed the patient's chart and labs.  Questions were answered to the patient's satisfaction.   Cath Lab Visit (complete for each Cath Lab visit)  Clinical Evaluation Leading to the Procedure:   ACS: Yes.    Non-ACS:    Anginal Classification: CCS III  Anti-ischemic medical therapy: Maximal Therapy (2 or more classes of medications)  Non-Invasive Test Results: No non-invasive testing performed  Prior CABG: Previous CABG        [name] [facility] 12/12/2019 8:59 AM

## 2019-12-12 NOTE — Progress Notes (Signed)
ANTICOAGULATION CONSULT NOTE - Initial Consult  Pharmacy Consult for heparin  Indication: chest pain/ACS  Allergies  Allergen Reactions  . Ace Inhibitors Other (See Comments)    unknown  . Nitrates, Organic Swelling    Swelling in hands  . Shellfish Allergy Nausea Only    Pt states scallops-not all shellfish-GI symptoms/ nausea -50+ years ago  . Penicillins Rash    Patient Measurements: Height: 5\' 10"  (177.8 cm) Weight: 81.6 kg (180 lb) IBW/kg (Calculated) : 73   Vital Signs: Temp: 98 F (36.7 C) (06/17 0714) Temp Source: Skin (06/17 0714) BP: 144/81 (06/17 1215) Pulse Rate: 48 (06/17 1215)  Labs: Recent Labs    12/12/19 0925 12/12/19 0928  HGB 10.9* 11.2*  HCT 32.0* 33.0*    CrCl cannot be calculated (Patient's most recent lab result is older than the maximum 21 days allowed.).   Medical History: Past Medical History:  Diagnosis Date  . Cancer ([place])   . Chronic kidney disease    kidney stones  . Coronary artery disease    with CABG in 2006 with LIMA to LAD, SVG to mid and distal OM, SVG to AM. Normal Myoview in May of 2013  . Diabetes mellitus    Type 2  . GERD (gastroesophageal reflux disease)   . Hematuria   . Hypercholesterolemia   . Hypertension   . Irritable bowel syndrome   . Loose stools   . Prostate cancer ([place])   . Urinary obstruction      Assessment: 87yom admitted with CP s/p cath with calcification of LCx plan atherectomy 6/18 - loaded with clopidogrel. Start heparin tonight 8hr after sheath pulled   Goal of Therapy:  Heparin level 0.3-0.7 units/ml Monitor platelets by anticoagulation protocol: Yes   Plan:  Start heparin drip 1100 uts/hr at 6pm (8hr after sheath pulled) HL 6hr after start  Daily cbc, HL  Bonnita Nasuti Pharm.D. CPP, BCPS Clinical Pharmacist [phone] 12/12/2019 1:42 PM

## 2019-12-13 ENCOUNTER — Ambulatory Visit (HOSPITAL_COMMUNITY): Admission: RE | Admit: 2019-12-13 | Payer: Medicare PPO | Source: Home / Self Care | Admitting: Cardiology

## 2019-12-13 ENCOUNTER — Encounter (HOSPITAL_COMMUNITY): Payer: Self-pay | Admitting: Cardiology

## 2019-12-13 ENCOUNTER — Encounter (HOSPITAL_COMMUNITY)
Admission: AD | Disposition: A | Discharge status: Home / Self Care | Payer: Self-pay | Source: Home / Self Care | Attending: Cardiology

## 2019-12-13 DIAGNOSIS — I200 Unstable angina: Secondary | ICD-10-CM

## 2019-12-13 DIAGNOSIS — E785 Hyperlipidemia, unspecified: Secondary | ICD-10-CM

## 2019-12-13 DIAGNOSIS — I10 Essential (primary) hypertension: Secondary | ICD-10-CM

## 2019-12-13 HISTORY — PX: TEMPORARY PACEMAKER: CATH118268

## 2019-12-13 LAB — BASIC METABOLIC PANEL
Anion gap: 6 (ref 5–15)
BUN: 11 mg/dL (ref 8–23)
CO2: 29 mmol/L (ref 22–32)
Calcium: 8.6 mg/dL — ABNORMAL LOW (ref 8.9–10.3)
Chloride: 102 mmol/L (ref 98–111)
Creatinine, Ser: 0.83 mg/dL (ref 0.61–1.24)
GFR calc Af Amer: >60 mL/min (ref >60)
GFR calc non Af Amer: >60 mL/min (ref >60)
Glucose, Bld: 128 mg/dL — ABNORMAL HIGH (ref 70–99)
Potassium: 3.6 mmol/L (ref 3.5–5.1)
Sodium: 137 mmol/L (ref 135–145)

## 2019-12-13 LAB — CBC
HCT: 34.2 % — ABNORMAL LOW (ref 39.0–52.0)
Hemoglobin: 11.1 g/dL — ABNORMAL LOW (ref 13.0–17.0)
MCH: 30.5 pg (ref 26.0–34.0)
MCHC: 32.5 g/dL (ref 30.0–36.0)
MCV: 94 fL (ref 80.0–100.0)
Platelets: 160 10*3/uL (ref 150–400)
RBC: 3.64 MIL/uL — ABNORMAL LOW (ref 4.22–5.81)
RDW: 12.5 % (ref 11.5–15.5)
WBC: 5.3 10*3/uL (ref 4.0–10.5)
nRBC: 0 % (ref 0.0–0.2)

## 2019-12-13 LAB — POCT ACTIVATED CLOTTING TIME
Activated Clotting Time: 329 seconds
Activated Clotting Time: 318 seconds
Activated Clotting Time: 389 seconds
Activated Clotting Time: 191 seconds
Activated Clotting Time: 158 seconds

## 2019-12-13 LAB — GLUCOSE, CAPILLARY
Glucose-Capillary: 108 mg/dL — ABNORMAL HIGH (ref 70–99)
Glucose-Capillary: 97 mg/dL (ref 70–99)
Glucose-Capillary: 58 mg/dL — ABNORMAL LOW (ref 70–99)
Glucose-Capillary: 81 mg/dL (ref 70–99)
Glucose-Capillary: 144 mg/dL — ABNORMAL HIGH (ref 70–99)

## 2019-12-13 LAB — HEMOGLOBIN A1C
Hgb A1c MFr Bld: 6.8 % — ABNORMAL HIGH (ref 4.8–5.6)
Mean Plasma Glucose: 148 mg/dL

## 2019-12-13 LAB — HEPARIN LEVEL (UNFRACTIONATED): Heparin Unfractionated: 0.49 IU/mL (ref 0.30–0.70)

## 2019-12-13 SURGERY — TEMPORARY PACEMAKER
Anesthesia: LOCAL

## 2019-12-13 MED ORDER — DEXTROSE 50 % IV SOLN
INTRAVENOUS | Status: AC
  Administered 2019-12-13: 50 mL
  Filled 2019-12-13: qty 50

## 2019-12-13 MED ORDER — SODIUM CHLORIDE 0.9% FLUSH: 3.0000 mL | Freq: Two times a day (BID) | INTRAVENOUS | Status: DC

## 2019-12-13 MED ORDER — HEPARIN SODIUM (PORCINE) 1000 UNIT/ML IJ SOLN
INTRAMUSCULAR | Status: DC | PRN
  Administered 2019-12-13: 9000 [IU] via INTRAVENOUS

## 2019-12-13 MED ORDER — NITROGLYCERIN 1 MG/10 ML FOR IR/CATH LAB
INTRA_ARTERIAL | Status: AC
  Filled 2019-12-13: qty 10

## 2019-12-13 MED ORDER — IOHEXOL 350 MG/ML SOLN
INTRAVENOUS | Status: AC
  Filled 2019-12-13: qty 1

## 2019-12-13 MED ORDER — HEPARIN (PORCINE) IN NACL 1000-0.9 UT/500ML-% IV SOLN
INTRAVENOUS | Status: DC | PRN
  Administered 2019-12-13 (×2): 500 mL

## 2019-12-13 MED ORDER — VERAPAMIL HCL 2.5 MG/ML IV SOLN
INTRAVENOUS | Status: AC
  Filled 2019-12-13: qty 2

## 2019-12-13 MED ORDER — SODIUM CHLORIDE 0.9 % IV SOLN: 250.0000 mL | INTRAVENOUS | Status: DC | PRN

## 2019-12-13 MED ORDER — SODIUM CHLORIDE 0.9 % WEIGHT BASED INFUSION: 1.0000 mL/kg/h | INTRAVENOUS | Status: DC

## 2019-12-13 MED ORDER — HEPARIN (PORCINE) IN NACL 1000-0.9 UT/500ML-% IV SOLN
INTRAVENOUS | Status: AC
  Filled 2019-12-13: qty 1000

## 2019-12-13 MED ORDER — FENTANYL CITRATE (PF) 100 MCG/2ML IJ SOLN
INTRAMUSCULAR | Status: AC
  Filled 2019-12-13: qty 2

## 2019-12-13 MED ORDER — AMLODIPINE BESYLATE 5 MG PO TABS
5.0000 mg | ORAL_TABLET | Freq: Every day | ORAL | Status: DC
  Administered 2019-12-14 – 2019-12-16 (×3): 5 mg via ORAL
  Filled 2019-12-13 (×3): qty 1

## 2019-12-13 MED ORDER — LIDOCAINE HCL (PF) 1 % IJ SOLN
INTRAMUSCULAR | Status: AC
  Filled 2019-12-13: qty 30

## 2019-12-13 MED ORDER — MIDAZOLAM HCL 2 MG/2ML IJ SOLN
INTRAMUSCULAR | Status: DC | PRN
  Administered 2019-12-13 (×2): 1 mg via INTRAVENOUS

## 2019-12-13 MED ORDER — HYDRALAZINE HCL 20 MG/ML IJ SOLN
10.0000 mg | INTRAMUSCULAR | Status: AC | PRN
  Administered 2019-12-13: 10 mg via INTRAVENOUS
  Filled 2019-12-13 (×2): qty 1

## 2019-12-13 MED ORDER — IOHEXOL 350 MG/ML SOLN
INTRAVENOUS | Status: DC | PRN
  Administered 2019-12-13: 125 mL via INTRA_ARTERIAL

## 2019-12-13 MED ORDER — ATORVASTATIN CALCIUM 80 MG PO TABS
80.0000 mg | ORAL_TABLET | Freq: Every day | ORAL | Status: DC
  Administered 2019-12-13 – 2019-12-16 (×4): 80 mg via ORAL
  Filled 2019-12-13 (×4): qty 1

## 2019-12-13 MED ORDER — HEPARIN SODIUM (PORCINE) 1000 UNIT/ML IJ SOLN
INTRAMUSCULAR | Status: AC
  Filled 2019-12-13: qty 1

## 2019-12-13 MED ORDER — MIDAZOLAM HCL 2 MG/2ML IJ SOLN
INTRAMUSCULAR | Status: AC
  Filled 2019-12-13: qty 2

## 2019-12-13 MED ORDER — SODIUM CHLORIDE 0.9 % WEIGHT BASED INFUSION: 1.0000 mL/kg/h | INTRAVENOUS | Status: AC

## 2019-12-13 MED ORDER — SODIUM CHLORIDE 0.9 % WEIGHT BASED INFUSION
3.0000 mL/kg/h | INTRAVENOUS | Status: DC
  Administered 2019-12-13: 3 mL/kg/h via INTRAVENOUS

## 2019-12-13 MED ORDER — SODIUM CHLORIDE 0.9 % IV SOLN
INTRAVENOUS | Status: DC
  Administered 2019-12-13: 500 mL via INTRAVENOUS

## 2019-12-13 MED ORDER — SODIUM CHLORIDE 0.9% FLUSH
3.0000 mL | Freq: Two times a day (BID) | INTRAVENOUS | Status: DC
  Administered 2019-12-15 – 2019-12-16 (×2): 3 mL via INTRAVENOUS

## 2019-12-13 MED ORDER — NITROGLYCERIN 1 MG/10 ML FOR IR/CATH LAB
INTRA_ARTERIAL | Status: DC | PRN
  Administered 2019-12-13: 200 ug via INTRACORONARY

## 2019-12-13 MED ORDER — SODIUM CHLORIDE 0.9% FLUSH: 3.0000 mL | INTRAVENOUS | Status: DC | PRN

## 2019-12-13 MED ORDER — LIDOCAINE HCL (PF) 1 % IJ SOLN
INTRAMUSCULAR | Status: DC | PRN
  Administered 2019-12-13: 30 mL via INTRADERMAL

## 2019-12-13 MED ORDER — ASPIRIN 81 MG PO CHEW: 81.0000 mg | CHEWABLE_TABLET | Freq: Every day | ORAL | Status: DC

## 2019-12-13 MED ORDER — FENTANYL CITRATE (PF) 100 MCG/2ML IJ SOLN
INTRAMUSCULAR | Status: DC | PRN
  Administered 2019-12-13 (×2): 25 ug via INTRAVENOUS

## 2019-12-13 MED ORDER — SODIUM CHLORIDE 0.9 % IV BOLUS
500.0000 mL | Freq: Once | INTRAVENOUS | Status: AC
  Administered 2019-12-13: 500 mL via INTRAVENOUS

## 2019-12-13 SURGICAL SUPPLY — 19 items
CATH LAUNCHER 6FR EBU3.5 (CATHETERS) ×3 IMPLANT
CATH S G BIP PACING (CATHETERS) ×3 IMPLANT
CATH SUPERCROSS ANGLED 90 DEG (MICROCATHETER) ×3 IMPLANT
CATH TELEPORT (CATHETERS) ×3 IMPLANT
ELECT DEFIB PAD ADLT CADENCE (PAD) ×3 IMPLANT
KIT ENCORE 26 ADVANTAGE (KITS) ×3 IMPLANT
KIT HEART LEFT (KITS) ×3 IMPLANT
LUBRICANT VIPERSLIDE CORONARY (MISCELLANEOUS) ×3 IMPLANT
PACK CARDIAC CATHETERIZATION (CUSTOM PROCEDURE TRAY) ×3 IMPLANT
SHEATH PINNACLE 6F 10CM (SHEATH) ×6 IMPLANT
SHEATH PROBE COVER 6X72 (BAG) ×3 IMPLANT
TRANSDUCER W/STOPCOCK (MISCELLANEOUS) ×6 IMPLANT
TUBING CIL FLEX 10 FLL-RA (TUBING) ×3 IMPLANT
WIRE ASAHI FIELDER XT 300CM (WIRE) ×3 IMPLANT
WIRE ASAHI MIRACLEBROS-3 300CM (WIRE) ×3 IMPLANT
WIRE ASAHI PROWATER 180CM (WIRE) ×3 IMPLANT
WIRE ASAHI PROWATER 300CM (WIRE) ×3 IMPLANT
WIRE EMERALD 3MM-J .035X150CM (WIRE) ×3 IMPLANT
WIRE HI TORQ WHISPER MS 300CM (WIRE) ×3 IMPLANT

## 2019-12-13 NOTE — Progress Notes (Addendum)
Progress Note  Patient Name: Alexander Choi Date of Encounter: 12/13/2019  [facility] Cardiologist: Peter [name], MD   Subjective   Mild left-sided chest discomfort overnight which resolved.  No shortness of breath.  Inpatient Medications    Scheduled Meds: . amLODipine  2.5 mg Oral Daily  . aspirin EC  81 mg Oral Daily  . atorvastatin  40 mg Oral Daily  . clopidogrel  75 mg Oral Q breakfast  . famotidine  20 mg Oral Daily  . gabapentin  100 mg Oral BID  . glimepiride  4 mg Oral Q breakfast  . insulin aspart  0-15 Units Subcutaneous TID WC  . losartan  100 mg Oral Daily  . metoprolol succinate  25 mg Oral Daily  . pantoprazole  40 mg Oral BID  . sodium chloride flush  3 mL Intravenous Q12H  . sodium chloride flush  3 mL Intravenous Q12H   Continuous Infusions: . sodium chloride    . sodium chloride    . sodium chloride    . heparin 1,200 Units/hr (12/13/19 0058)   PRN Meds: sodium chloride, sodium chloride, acetaminophen, nitroGLYCERIN, ondansetron (ZOFRAN) IV, sodium chloride flush, sodium chloride flush   Vital Signs    Vitals:   12/12/19 2107 12/12/19 2309 12/13/19 0516 12/13/19 0731  BP: 117/65 (!) 160/54 (!) 152/59 (!) 142/58  Pulse: (!) 48 (!) 49 (!) 52 (!) 52  Resp: 15 14 16 10   Temp: 97.9 F (36.6 C) 97.8 F (36.6 C) 97.9 F (36.6 C) 98 F (36.7 C)  TempSrc: Oral Oral Oral Oral  SpO2: 96% 98% 100% 97%  Weight:   81.8 kg   Height:        Intake/Output Summary (Last 24 hours) at 12/13/2019 0831 Last data filed at 12/13/2019 0518 Gross per 24 hour  Intake 845.52 ml  Output 2450 ml  Net -1604.48 ml   Last 3 Weights 12/13/2019 12/12/2019 12/09/2019  Weight (lbs) 180 lb 4.8 oz 180 lb 185 lb 3.2 oz  Weight (kg) 81.784 kg 81.647 kg 84.006 kg      Telemetry    Sinus bradycardia in 40s with PAC- Personally Reviewed  ECG    No new tracing  Physical Exam   GEN: No acute distress.   Neck: No JVD Cardiac: RRR, no murmurs, rubs, or gallops.   Left radial cath site without hematoma Respiratory: Clear to auscultation bilaterally. GI: Soft, nontender, non-distended  MS: No edema; No deformity. Neuro:  Nonfocal  Psych: Normal affect   Labs    Chemistry Recent Labs  Lab 12/12/19 0925 12/12/19 0928 12/13/19 0457  NA 143 143 137  K 3.8 3.8 3.6  CL  --   --  102  CO2  --   --  29  GLUCOSE  --   --  128*  BUN  --   --  11  CREATININE  --   --  0.83  CALCIUM  --   --  8.6*  GFRNONAA  --   --  >60  GFRAA  --   --  >60  ANIONGAP  --   --  6     Hematology Recent Labs  Lab 12/12/19 0925 12/12/19 0928 12/13/19 0457  WBC  --   --  5.3  RBC  --   --  3.64*  HGB 10.9* 11.2* 11.1*  HCT 32.0* 33.0* 34.2*  MCV  --   --  94.0  MCH  --   --  30.5  MCHC  --   --  32.5  RDW  --   --  12.5  PLT  --   --  160    Radiology    CARDIAC CATHETERIZATION  Result Date: 12/12/2019  Mid LM lesion is 35% stenosed.  Ost LAD to Mid LAD lesion is 100% stenosed.  Prox Cx lesion is 99% stenosed.  Prox Cx to Mid Cx lesion is 90% stenosed.  1st Mrg lesion is 100% stenosed.  2nd Mrg lesion is 75% stenosed.  Prox RCA to Dist RCA lesion is 100% stenosed.  LIMA graft was visualized by angiography and is normal in caliber.  The graft exhibits no disease.  Dist LAD-1 lesion is 60% stenosed.  Dist LAD-2 lesion is 90% stenosed.  SVG graft was visualized by angiography and is large.  The graft exhibits minimal luminal irregularities.  SVG graft was visualized by angiography and is normal in caliber.  The graft exhibits mild .  The left ventricular systolic function is normal.  LV end diastolic pressure is normal.  The left ventricular ejection fraction is 55-65% by visual estimate.  1. Complex 3 vessel obstructive CAD. 2. Patent LIMA to the LAD. There is severe disease in the distal LAD. The vessel is small in caliber and not suitable for PCI 3. Patent SVG to OM1 4. Patent SVG to RV marginal branch. The RCA is occluded with left to right  collaterals from the LCx to the distal RCA 5. Normal LV function 6. Normal LV filling pressures 7. Normal right heart pressures Plan: The native LCX is severely diseased proximally and in the mid vessel with aneurysmal dilation and heavy calcification. This supplies the second and third OM branches and collaterals to the distal RCA. This is a potential target for intervention. It will require atherectomy. Given progressive symptoms will admit to telemetry. Start IV heparin. Load with Plavix. Planned staged complex PCI of the LCx tomorrow.    Cardiac Studies   RIGHT/LEFT HEART CATH AND CORONARY/GRAFT ANGIOGRAPHY  Conclusion    Mid LM lesion is 35% stenosed.  Ost LAD to Mid LAD lesion is 100% stenosed.  Prox Cx lesion is 99% stenosed.  Prox Cx to Mid Cx lesion is 90% stenosed.  1st Mrg lesion is 100% stenosed.  2nd Mrg lesion is 75% stenosed.  Prox RCA to Dist RCA lesion is 100% stenosed.  LIMA graft was visualized by angiography and is normal in caliber.  The graft exhibits no disease.  Dist LAD-1 lesion is 60% stenosed.  Dist LAD-2 lesion is 90% stenosed.  SVG graft was visualized by angiography and is large.  The graft exhibits minimal luminal irregularities.  SVG graft was visualized by angiography and is normal in caliber.  The graft exhibits mild .  The left ventricular systolic function is normal.  LV end diastolic pressure is normal.  The left ventricular ejection fraction is 55-65% by visual estimate.   1. Complex 3 vessel obstructive CAD.  2. Patent LIMA to the LAD. There is severe disease in the distal LAD. The vessel is small in caliber and not suitable for PCI 3. Patent SVG to OM1 4. Patent SVG to RV marginal branch. The RCA is occluded with left to right collaterals from the LCx to the distal RCA 5. Normal LV function 6. Normal LV filling pressures 7. Normal right heart pressures  Plan: The native LCX is severely diseased proximally and in the mid  vessel with aneurysmal dilation and heavy calcification. This supplies the second and  third OM branches and collaterals to the distal RCA. This is a potential target for intervention. It will require atherectomy. Given progressive symptoms will admit to telemetry. Start IV heparin. Load with Plavix. Planned staged complex PCI of the LCx tomorrow.   Diagnostic Dominance: Right  Intervention    Patient Profile     86 y.o. male with hx of CAD s/p CABG, HTN, HLD, GERD and DM who is recently dealing with unstable angina and presented for cath.   Assessment & Plan    1. CAD - Cath yesterday showed patent graft and details as above. For atherectomy of Lcx today. Loaded with Plavix yesterday and started on heparin.   2. HTN - BP relatively stable. - Continue Amlodipine 2.5mg  qd, Toprol XL 25mg  qd and Losartan 100mg  qd  3. HLD - LDL 65 on 11/2019 -Given extensive CAD will increase Lipitor to 80mg  qd   4. DM - HgbA1c 6.8 - On SSI - Will get TOC consult to find benefits for [facility]  For questions or updates, please contact [place] Please consult [email or web address] for contact info under       SignedLeanor Kail, PA  12/13/2019, 8:31 AM     Patient seen and examined. Agree with assessment and plan.  Patient just back from Cath Lab for attempted intervention to the circumflex.  However the intervention was aborted and unsuccessful due to inability to cross the lesion with a wire.  Plan medical therapy with aspirin.  Amlodipine will be increased to 5 mg.  Current heart rate in the 40s, will hold next dose of metoprolol and may need reduced dose  at 12.5 mg.  With chronic angina that has increased recently we will also initiate ranolazine 500 mg twice a day particularly since attempted PCI was unsuccessful.  QTc interval 404 ms. Cath site stable.  Probable discharge tomorrow if stable.     Troy Sine, MD, [facility] 12/13/2019 12:49 PM

## 2019-12-13 NOTE — Progress Notes (Signed)
ANTICOAGULATION CONSULT NOTE - Follow Up Consult  Pharmacy Consult for heparin  Indication: chest pain/ACS  Allergies  Allergen Reactions  . Ace Inhibitors Other (See Comments)    unknown  . Nitrates, Organic Swelling    Swelling in hands  . Shellfish Allergy Nausea Only    Pt states scallops-not all shellfish-GI symptoms/ nausea -50+ years ago  . Penicillins Rash    Patient Measurements: Height: 5\' 10"  (177.8 cm) Weight: 81.8 kg (180 lb 4.8 oz) IBW/kg (Calculated) : 73   Vital Signs: Temp: 98 F (36.7 C) (06/18 0731) Temp Source: Oral (06/18 0731) BP: 142/58 (06/18 0731) Pulse Rate: 52 (06/18 0731)  Labs: Recent Labs    12/12/19 0925 12/12/19 0925 12/12/19 0928 12/12/19 2324 12/13/19 0457  HGB 10.9*   < > 11.2*  --  11.1*  HCT 32.0*  --  33.0*  --  34.2*  PLT  --   --   --   --  160  HEPARINUNFRC  --   --   --  0.28* 0.49  CREATININE  --   --   --   --  0.83   < > = values in this interval not displayed.    Estimated Creatinine Clearance: 67.2 mL/min (by C-G formula based on SCr of 0.83 mg/dL).   Medical History: Past Medical History:  Diagnosis Date  . Cancer ([place])   . Chronic kidney disease    kidney stones  . Coronary artery disease    with CABG in 2006 with LIMA to LAD, SVG to mid and distal OM, SVG to AM. Normal Myoview in May of 2013  . Diabetes mellitus    Type 2  . GERD (gastroesophageal reflux disease)   . Hematuria   . Hypercholesterolemia   . Hypertension   . Irritable bowel syndrome   . Loose stools   . Prostate cancer ([place])   . Urinary obstruction      Assessment: 87yom admitted with CP s/p cath with calcification of LCx plan atherectomy 6/18 - loaded with clopidogrel.  heparin drip 1200 uts/hr HL 0.49 at goal cbc ok   Goal of Therapy:  Heparin level 0.3-0.7 units/ml Monitor platelets by anticoagulation protocol: Yes   Plan:  Continue  heparin drip 1200 uts/hr  Follow up after cath Daily cbc, HL  Bonnita Nasuti Pharm.D. CPP,  BCPS Clinical Pharmacist [phone] 12/13/2019 8:34 AM

## 2019-12-13 NOTE — Progress Notes (Signed)
[place] for heparin  Indication: chest pain/ACS  Allergies  Allergen Reactions  . Ace Inhibitors Other (See Comments)    unknown  . Nitrates, Organic Swelling    Swelling in hands  . Shellfish Allergy Nausea Only    Pt states scallops-not all shellfish-GI symptoms/ nausea -50+ years ago  . Penicillins Rash    Patient Measurements: Height: 5\' 10"  (177.8 cm) Weight: 81.6 kg (180 lb) IBW/kg (Calculated) : 73   Vital Signs: Temp: 97.8 F (36.6 C) (06/17 2309) Temp Source: Oral (06/17 2309) BP: 160/54 (06/17 2309) Pulse Rate: 49 (06/17 2309)  Labs: Recent Labs    12/12/19 0925 12/12/19 0928 12/12/19 2324  HGB 10.9* 11.2*  --   HCT 32.0* 33.0*  --   HEPARINUNFRC  --   --  0.28*    CrCl cannot be calculated (Patient's most recent lab result is older than the maximum 21 days allowed.).  Assessment: 86 y.o. male with chest pain for heparin   Goal of Therapy:  Heparin level 0.3-0.7 units/ml Monitor platelets by anticoagulation protocol: Yes   Plan:  Increase Heparin 1200 units/hr  Phillis Knack, PharmD, BCPS  12/13/2019 12:04 AM

## 2019-12-13 NOTE — Interval H&P Note (Signed)
History and Physical Interval Note:  12/13/2019 9:29 AM  Alexander Choi  has presented today for surgery, with the diagnosis of CAD.  The various methods of treatment have been discussed with the patient and family. After consideration of risks, benefits and other options for treatment, the patient has consented to  Procedure(s): CORONARY ATHERECTOMY (N/A) CORONARY STENT INTERVENTION (N/A) as a surgical intervention.  The patient's history has been reviewed, patient examined, no change in status, stable for surgery.  I have reviewed the patient's chart and labs.  Questions were answered to the patient's satisfaction.   Cath Lab Visit (complete for each Cath Lab visit)  Clinical Evaluation Leading to the Procedure:   ACS: Yes.    Non-ACS:    Anginal Classification: CCS IV  Anti-ischemic medical therapy: Maximal Therapy (2 or more classes of medications)  Non-Invasive Test Results: No non-invasive testing performed  Prior CABG: Previous CABG        [name] 12/13/2019 9:29 AM

## 2019-12-13 NOTE — Progress Notes (Signed)
Held Lopressor related to heart rate in the ~40, MD Claiborne Billings notified and approved holding.   Saddie Benders RN BSN

## 2019-12-13 NOTE — Progress Notes (Signed)
   12/13/19 0731  Assess: MEWS Score  Temp 98 F (36.7 C)  BP (!) 142/58  Pulse Rate (!) 52  ECG Heart Rate (!) 49  Resp 10  SpO2 97 %  O2 Device Room Air  Assess: MEWS Score  MEWS Temp 0  MEWS Systolic 0  MEWS Pulse 1  MEWS RR 1  MEWS LOC 0  MEWS Score 2  MEWS Score Color Yellow  Assess: if the MEWS score is Yellow or Red  Were vital signs taken at a resting state? Yes  Focused Assessment Documented focused assessment  Early Detection of Sepsis Score *See Row Information* Low  MEWS guidelines implemented *See Row Information* Yes  Treat  MEWS Interventions Administered scheduled meds/treatments  Take Vital Signs  Increase Vital Sign Frequency  Yellow: Q 2hr X 2 then Q 4hr X 2, if remains yellow, continue Q 4hrs  Escalate  MEWS: Escalate Yellow: discuss with charge nurse/RN and consider discussing with provider and RRT  Notify: Charge Nurse/RN  Name of Charge Nurse/RN Notified Otila Kluver   Date Charge Nurse/RN Notified 12/13/19  Time Charge Nurse/RN Notified [phone]

## 2019-12-13 NOTE — Care Management (Signed)
4193 12-13-19 Case Manager received consult for Farxiga. Benefits check submitted for Farxgia 10mg  qd-Case Manager will follow for cost. Graves-Bigelow, Ocie Cornfield , RN, BSN Case Manager

## 2019-12-14 DIAGNOSIS — I2510 Atherosclerotic heart disease of native coronary artery without angina pectoris: Secondary | ICD-10-CM

## 2019-12-14 DIAGNOSIS — I471 Supraventricular tachycardia: Secondary | ICD-10-CM

## 2019-12-14 DIAGNOSIS — I2583 Coronary atherosclerosis due to lipid rich plaque: Secondary | ICD-10-CM

## 2019-12-14 DIAGNOSIS — E119 Type 2 diabetes mellitus without complications: Secondary | ICD-10-CM

## 2019-12-14 DIAGNOSIS — E7800 Pure hypercholesterolemia, unspecified: Secondary | ICD-10-CM

## 2019-12-14 LAB — GLUCOSE, CAPILLARY
Glucose-Capillary: 117 mg/dL — ABNORMAL HIGH (ref 70–99)
Glucose-Capillary: 137 mg/dL — ABNORMAL HIGH (ref 70–99)
Glucose-Capillary: 99 mg/dL (ref 70–99)
Glucose-Capillary: 178 mg/dL — ABNORMAL HIGH (ref 70–99)

## 2019-12-14 LAB — CBC
HCT: 33.6 % — ABNORMAL LOW (ref 39.0–52.0)
Hemoglobin: 10.9 g/dL — ABNORMAL LOW (ref 13.0–17.0)
MCH: 30.7 pg (ref 26.0–34.0)
MCHC: 32.4 g/dL (ref 30.0–36.0)
MCV: 94.6 fL (ref 80.0–100.0)
Platelets: 173 10*3/uL (ref 150–400)
RBC: 3.55 MIL/uL — ABNORMAL LOW (ref 4.22–5.81)
RDW: 12.7 % (ref 11.5–15.5)
WBC: 8.3 10*3/uL (ref 4.0–10.5)
nRBC: 0 % (ref 0.0–0.2)

## 2019-12-14 MED ORDER — AMIODARONE HCL 200 MG PO TABS
200.0000 mg | ORAL_TABLET | Freq: Two times a day (BID) | ORAL | Status: DC
  Filled 2019-12-14: qty 1

## 2019-12-14 MED ORDER — RANOLAZINE ER 500 MG PO TB12
500.0000 mg | ORAL_TABLET | Freq: Two times a day (BID) | ORAL | Status: DC
  Administered 2019-12-14 – 2019-12-16 (×5): 500 mg via ORAL
  Filled 2019-12-14 (×5): qty 1

## 2019-12-14 NOTE — Progress Notes (Signed)
CARDIAC REHAB PHASE I   PRE:  Rate/Rhythm: 78 Afib  BP:  Supine: 126/54   Sitting:   Standing:    SaO2: 99 RA  MODE:  Ambulation: 470 ft   POST:  Rate/Rhythm: 118 Afib  BP:  Supine:   Sitting: 146/61  Standing:    SaO2: 100 RA [id number] On arrival noted that pt in Afib with rate 78. Also having runs of SR with frequent PAC's. Assisted X 1 and used walker to ambulate. Gait steady with walker. Pt walked 470 feet without c/o but when questioned he states that he felt the indigestion close to the end of our walk.He states that as soon as he rested in the chair the indigestion went away, Pt to recliner after walk with call light in reach. Completed dicharge education with pt. We discussed angina, use of sl NTG although he states that it makes him sick and he doesn't use it, exercise guideline, heart healthy diabetic diet and Outpt. CRP. Will send referral to Outpt. CRP in [place].   Rodney Langton RN 12/14/2019 9:45 AM

## 2019-12-14 NOTE — Progress Notes (Addendum)
Progress Note  Patient Name: ZAIRE LEVESQUE Date of Encounter: 12/14/2019  [facility] Cardiologist: Peter [name], MD   Subjective   Denies any chest pain or SOB.  Cath yesterday showed patent grafts with 99% proximal and  90% mid LCx with 60% distal LAD. Unsuccessful PCI of LCx due to inability to cross with wire. Up and walked with Cardiac Rehab and complained of more indigestion.  Also on tele having multiple runs of atrial tachycardia but no afib noted.  Inpatient Medications    Scheduled Meds: . amLODipine  5 mg Oral Daily  . aspirin EC  81 mg Oral Daily  . atorvastatin  80 mg Oral Daily  . famotidine  20 mg Oral Daily  . gabapentin  100 mg Oral BID  . glimepiride  4 mg Oral Q breakfast  . insulin aspart  0-15 Units Subcutaneous TID WC  . losartan  100 mg Oral Daily  . metoprolol succinate  25 mg Oral Daily  . pantoprazole  40 mg Oral BID  . sodium chloride flush  3 mL Intravenous Q12H  . sodium chloride flush  3 mL Intravenous Q12H   Continuous Infusions: . sodium chloride    . sodium chloride    . sodium chloride 500 mL (12/13/19 1721)   PRN Meds: sodium chloride, sodium chloride, acetaminophen, nitroGLYCERIN, ondansetron (ZOFRAN) IV, sodium chloride flush, sodium chloride flush   Vital Signs    Vitals:   12/13/19 2358 12/14/19 0529 12/14/19 0812 12/14/19 0945  BP: (!) 120/51 104/61 (!) 126/54 (!) 146/61  Pulse:  71 62 66  Resp:  17 19   Temp: 98 F (36.7 C) 98.7 F (37.1 C) 98.2 F (36.8 C)   TempSrc: Oral Oral Oral   SpO2:  97% 98%   Weight:      Height:        Intake/Output Summary (Last 24 hours) at 12/14/2019 0956 Last data filed at 12/14/2019 0054 Gross per 24 hour  Intake 408.6 ml  Output 925 ml  Net -516.4 ml   Last 3 Weights 12/13/2019 12/12/2019 12/09/2019  Weight (lbs) 180 lb 4.8 oz 180 lb 185 lb 3.2 oz  Weight (kg) 81.784 kg 81.647 kg 84.006 kg      Telemetry    Sinus bradycardia in 40s with PAC- Personally Reviewed  ECG    No  new EKG to review  Physical Exam   GEN: Well nourished, well developed in no acute distress HEENT: Normal NECK: No JVD; No carotid bruits LYMPHATICS: No lymphadenopathy CARDIAC:RRR, no murmurs, rubs, gallops RESPIRATORY:  Clear to auscultation without rales, wheezing or rhonchi  ABDOMEN: Soft, non-tender, non-distended MUSCULOSKELETAL:  No edema; No deformity  SKIN: Warm and dry. Right radial artery cath site clean and dry with no hematoma NEUROLOGIC:  Alert and oriented x 3 PSYCHIATRIC:  Normal affect   Labs    Chemistry Recent Labs  Lab 12/12/19 0925 12/12/19 0928 12/13/19 0457  NA 143 143 137  K 3.8 3.8 3.6  CL  --   --  102  CO2  --   --  29  GLUCOSE  --   --  128*  BUN  --   --  11  CREATININE  --   --  0.83  CALCIUM  --   --  8.6*  GFRNONAA  --   --  >60  GFRAA  --   --  >60  ANIONGAP  --   --  6     Hematology Recent  Labs  Lab 12/12/19 0928 12/13/19 0457 12/14/19 0411  WBC  --  5.3 8.3  RBC  --  3.64* 3.55*  HGB 11.2* 11.1* 10.9*  HCT 33.0* 34.2* 33.6*  MCV  --  94.0 94.6  MCH  --  30.5 30.7  MCHC  --  32.5 32.4  RDW  --  12.5 12.7  PLT  --  160 173    Radiology    CARDIAC CATHETERIZATION  Result Date: 12/13/2019  Dist LAD-1 lesion is 60% stenosed.  Prox Cx lesion is 99% stenosed.  Prox Cx to Mid Cx lesion is 90% stenosed.  2nd Mrg lesion is 75% stenosed.  Unsuccessful PCI due to inability to cross with a wire.  1. Unsuccessful PCI of the LCx due to inability to cross the lesion with a wire. Plan: medical therapy. ASA only. Will increase amlodipine to 5 mg daily. Consider Ranexa if symptoms persists. Anticipate DC tomorrow am.   ABORTED INVASIVE LAB PROCEDURE  Result Date: 12/13/2019  Dist LAD-1 lesion is 60% stenosed.  Prox Cx lesion is 99% stenosed.  Prox Cx to Mid Cx lesion is 90% stenosed.  2nd Mrg lesion is 75% stenosed.  Unsuccessful PCI due to inability to cross with a wire.  1. Unsuccessful PCI of the LCx due to inability to cross  the lesion with a wire. Plan: medical therapy. ASA only. Will increase amlodipine to 5 mg daily. Consider Ranexa if symptoms persists. Anticipate DC tomorrow am.    Cardiac Studies   RIGHT/LEFT HEART CATH AND CORONARY/GRAFT ANGIOGRAPHY  Conclusion    Mid LM lesion is 35% stenosed.  Ost LAD to Mid LAD lesion is 100% stenosed.  Prox Cx lesion is 99% stenosed.  Prox Cx to Mid Cx lesion is 90% stenosed.  1st Mrg lesion is 100% stenosed.  2nd Mrg lesion is 75% stenosed.  Prox RCA to Dist RCA lesion is 100% stenosed.  LIMA graft was visualized by angiography and is normal in caliber.  The graft exhibits no disease.  Dist LAD-1 lesion is 60% stenosed.  Dist LAD-2 lesion is 90% stenosed.  SVG graft was visualized by angiography and is large.  The graft exhibits minimal luminal irregularities.  SVG graft was visualized by angiography and is normal in caliber.  The graft exhibits mild .  The left ventricular systolic function is normal.  LV end diastolic pressure is normal.  The left ventricular ejection fraction is 55-65% by visual estimate.   1. Complex 3 vessel obstructive CAD.  2. Patent LIMA to the LAD. There is severe disease in the distal LAD. The vessel is small in caliber and not suitable for PCI 3. Patent SVG to OM1 4. Patent SVG to RV marginal branch. The RCA is occluded with left to right collaterals from the LCx to the distal RCA 5. Normal LV function 6. Normal LV filling pressures 7. 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 This supplies the second and third OM branches and collaterals to the distal RCA. This is a potential target for intervention. It will require atherectomy. Given progressive symptoms will admit to telemetry. Start IV heparin. Load with Plavix. Planned staged complex PCI of the LCx tomorrow.   Diagnostic Dominance:  Right  Intervention  Conclusion    Dist LAD-1 lesion is 60% stenosed.  Prox Cx lesion is 99% stenosed.  Prox Cx to Mid Cx lesion is 90% stenosed.  2nd  Mrg lesion is 75% stenosed.  Unsuccessful PCI due to inability to cross with a wire.   1. Unsuccessful PCI of the LCx due to inability to cross the lesion with a wire.   Plan: medical therapy. ASA only. Will increase amlodipine to 5 mg daily. Consider Ranexa if symptoms persists. Anticipate DC tomorrow am.    Patient Profile     86 y.o. male with hx of CAD s/p CABG, HTN, HLD, GERD and DM who is recently dealing with unstable angina and presented for cath.   Assessment & Plan    1. CAD -Cath showed patent graft with 99% proximal and  90% mid LCx with 60% distal LAD.  -s/p Unsuccessful PCI of LCx due to inability to cross with wire.  -continue medical management -amlodipine increased to 5mg  daily -add Ranexa 500mg  BID -continue ASA 81mg  daily, high dose statin  -cannot use nitrates due to hx of hand swelling and BB stopped due to bradycardia  2. HTN - BP well controlled at 126/37mmHg - Continue Amlodipine 5mg  daily and Losartan 100mg  qd  3. HLD -LDL 65 on 11/2019 -Given extensive CAD Lipitor was increased to 80mg  qd  -will need FLP and ALT in 6 weeks  4. DM - HgbA1c 6.8 - continue Amaryl 4mg  daily - TOC consult pending to find benefits for SPLG2>>if no info yet can be started outpt  5.  Nonsustained atrial tachycardia -he is having a lot of runs of fast atrial tachycardia -? Whether his "indigestion" is actually an arrhythmia -I have reviewed all the tele strips for past 24 hours and do not see anything that appears to be afib -his baseline HR is in the low 60's to upper 50's and had HRs into the 40's yesterday  -stop Toprol due to bradycardia yesterday and HR 57-61bpm today -will start Amio 200mg  BID to settle atrial arrhythmias down -would consider outpt event monitor to assess for silent PAF -check TSH  and BMET -follow EKG QTc while loading Amio  I have spent a total of 35 minutes with patient reviewing 2D echo, cardiac cath, discussing patient with Cardiac Rehab nurse , telemetry, EKGs, labs and examining patient as well as establishing an assessment and plan that was discussed with the patient.  > 50% of time was spent in direct patient care.    For questions or updates, please contact [place] Please consult [email or web address] for contact info under    Signed, Fransico Him, MD  12/14/2019, 9:56 AM

## 2019-12-15 LAB — GLUCOSE, CAPILLARY
Glucose-Capillary: 112 mg/dL — ABNORMAL HIGH (ref 70–99)
Glucose-Capillary: 195 mg/dL — ABNORMAL HIGH (ref 70–99)
Glucose-Capillary: 128 mg/dL — ABNORMAL HIGH (ref 70–99)
Glucose-Capillary: 179 mg/dL — ABNORMAL HIGH (ref 70–99)

## 2019-12-15 LAB — TSH: TSH: 6.725 u[IU]/mL — ABNORMAL HIGH (ref 0.350–4.500)

## 2019-12-15 MED ORDER — AMIODARONE HCL 200 MG PO TABS
200.0000 mg | ORAL_TABLET | Freq: Every day | ORAL | Status: DC
  Administered 2019-12-15 – 2019-12-16 (×2): 200 mg via ORAL
  Filled 2019-12-15 (×2): qty 1

## 2019-12-15 NOTE — Progress Notes (Signed)
Progress Note  Patient Name: Alexander Choi Date of Encounter: 12/15/2019  [facility] Cardiologist: Peter [name], MD   Subjective   No further CP.  Cath  showed patent grafts with 99% proximal and  90% mid LCx with 60% distal LAD. Unsuccessful PCI of LCx due to inability to cross with wire. Started Amio yesterday for frequent atrial tach but held last night and this am due to brady.  HR up in the upper 60's when talking.  Inpatient Medications    Scheduled Meds: . amiodarone  200 mg Oral Daily  . amLODipine  5 mg Oral Daily  . aspirin EC  81 mg Oral Daily  . atorvastatin  80 mg Oral Daily  . famotidine  20 mg Oral Daily  . gabapentin  100 mg Oral BID  . glimepiride  4 mg Oral Q breakfast  . insulin aspart  0-15 Units Subcutaneous TID WC  . losartan  100 mg Oral Daily  . pantoprazole  40 mg Oral BID  . ranolazine  500 mg Oral BID  . sodium chloride flush  3 mL Intravenous Q12H  . sodium chloride flush  3 mL Intravenous Q12H   Continuous Infusions: . sodium chloride    . sodium chloride    . sodium chloride 500 mL (12/13/19 1721)   PRN Meds: sodium chloride, sodium chloride, acetaminophen, nitroGLYCERIN, ondansetron (ZOFRAN) IV, sodium chloride flush, sodium chloride flush   Vital Signs    Vitals:   12/14/19 2105 12/15/19 0703 12/15/19 0729 12/15/19 0915  BP: (!) 120/52 (!) 137/59 (!) 141/53 117/69  Pulse: (!) 50 65 66   Resp:   15   Temp: 98 F (36.7 C) 98 F (36.7 C) 98 F (36.7 C)   TempSrc: Oral Oral Oral   SpO2: 100% 93% 95%   Weight:      Height:        Intake/Output Summary (Last 24 hours) at 12/15/2019 1022 Last data filed at 12/14/2019 2200 Gross per 24 hour  Intake 0 ml  Output 100 ml  Net -100 ml   Last 3 Weights 12/13/2019 12/12/2019 12/09/2019  Weight (lbs) 180 lb 4.8 oz 180 lb 185 lb 3.2 oz  Weight (kg) 81.784 kg 81.647 kg 84.006 kg      Telemetry    SB to NSR with HR 57-68bpm.  Occasional nonsustained atrial tachy- Personally  Reviewed  ECG    NSR with nonsustained atach, QTC 492ms  Physical Exam   GEN: Well nourished, well developed in no acute distress HEENT: Normal NECK: No JVD; No carotid bruits LYMPHATICS: No lymphadenopathy CARDIAC:RRR, no murmurs, rubs, gallops RESPIRATORY:  Clear to auscultation without rales, wheezing or rhonchi  ABDOMEN: Soft, non-tender, non-distended MUSCULOSKELETAL:  No edema; No deformity  SKIN: Warm and dry NEUROLOGIC:  Alert and oriented x 3 PSYCHIATRIC:  Normal affect    Labs    Chemistry Recent Labs  Lab 12/12/19 0925 12/12/19 0928 12/13/19 0457  NA 143 143 137  K 3.8 3.8 3.6  CL  --   --  102  CO2  --   --  29  GLUCOSE  --   --  128*  BUN  --   --  11  CREATININE  --   --  0.83  CALCIUM  --   --  8.6*  GFRNONAA  --   --  >60  GFRAA  --   --  >60  ANIONGAP  --   --  6  Hematology Recent Labs  Lab 12/12/19 0928 12/13/19 0457 12/14/19 0411  WBC  --  5.3 8.3  RBC  --  3.64* 3.55*  HGB 11.2* 11.1* 10.9*  HCT 33.0* 34.2* 33.6*  MCV  --  94.0 94.6  MCH  --  30.5 30.7  MCHC  --  32.5 32.4  RDW  --  12.5 12.7  PLT  --  160 173    Radiology    No results found.  Cardiac Studies   RIGHT/LEFT HEART CATH AND CORONARY/GRAFT ANGIOGRAPHY  Conclusion    Mid LM lesion is 35% stenosed.  Ost LAD to Mid LAD lesion is 100% stenosed.  Prox Cx lesion is 99% stenosed.  Prox Cx to Mid Cx lesion is 90% stenosed.  1st Mrg lesion is 100% stenosed.  2nd Mrg lesion is 75% stenosed.  Prox RCA to Dist RCA lesion is 100% stenosed.  LIMA graft was visualized by angiography and is normal in caliber.  The graft exhibits no disease.  Dist LAD-1 lesion is 60% stenosed.  Dist LAD-2 lesion is 90% stenosed.  SVG graft was visualized by angiography and is large.  The graft exhibits minimal luminal irregularities.  SVG graft was visualized by angiography and is normal in caliber.  The graft exhibits mild .  The left ventricular systolic  function is normal.  LV end diastolic pressure is normal.  The left ventricular ejection fraction is 55-65% by visual estimate.   1. Complex 3 vessel obstructive CAD.  2. Patent LIMA to the LAD. There is severe disease in the distal LAD. The vessel is small in caliber and not suitable for PCI 3. Patent SVG to OM1 4. Patent SVG to RV marginal branch. The RCA is occluded with left to right collaterals from the LCx to the distal RCA 5. Normal LV function 6. Normal LV filling pressures 7. 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 This supplies the second and third OM branches and collaterals to the distal RCA. This is a potential target for intervention. It will require atherectomy. Given progressive symptoms will admit to telemetry. Start IV heparin. Load with Plavix. Planned staged complex PCI of the LCx tomorrow.   Diagnostic Dominance: Right  Intervention  Conclusion    Dist LAD-1 lesion is 60% stenosed.  Prox Cx lesion is 99% stenosed.  Prox Cx to Mid Cx lesion is 90% stenosed.  2nd Mrg lesion is 75% stenosed.  Unsuccessful PCI due to inability to cross with a wire.   1. Unsuccessful PCI of the LCx due to inability to cross the lesion with a wire.   Plan: medical therapy. ASA only. Will increase amlodipine to 5 mg daily. Consider Ranexa if symptoms persists. Anticipate DC tomorrow am.    Patient Profile     86 y.o. male with hx of CAD s/p CABG, HTN, HLD, GERD and DM who is recently dealing with unstable angina and presented for cath.   Assessment & Plan    1. CAD -Cath showed patent graft with 99% proximal and  90% mid LCx with 60% distal LAD.  -s/p Unsuccessful PCI of LCx due to inability to cross with wire.  -continue medical management -amlodipine increased to 5mg  daily -added Ranexa 500mg  BID -continue ASA 81mg  daily, high dose statin   -cannot use nitrates due to hx of hand swelling and BB stopped due to bradycardia  2. HTN - BP well controlled  at 117/37mmHg - Continue Amlodipine 5mg  daily and Losartan 100mg  qd  3. HLD -LDL 65 on 11/2019 -Given extensive CAD Lipitor was increased to 80mg  qd  -will need FLP and ALT in 6 weeks  4. DM - HgbA1c 6.8 - continue Amaryl 4mg  daily - TOC consult pending to find benefits for SPLG2>>if no info yet can be started outpt  5.  Nonsustained atrial tachycardia -he is having a lot of runs of fast atrial tachycardia -? Whether his "indigestion" is actually an arrhythmia -I have reviewed all the tele strips for past 24 hours and do not see anything that appears to be afib -his baseline HR is in the low 60's to upper 50's and had HRs into the 40's yesterday  -stopped Toprol due to bradycardia  and HR 57-61bpm  -started Amio 200mg  BID to settle atrial arrhythmias down but developed mild brady -decrease Amio to 200mg  daily -would consider outpt event monitor to assess for silent PAF -check TSH and BMET -follow EKG QTc while loading Amio -PFTs with DLCO  I have spent a total of 35 minutes with patient reviewing 2D echo, cardiac cath, discussing patient with Cardiac Rehab nurse , telemetry, EKGs, labs and examining patient as well as establishing an assessment and plan that was discussed with the patient.  > 50% of time was spent in direct patient care.    For questions or updates, please contact [place] Please consult [email or web address] for contact info under    Signed, Fransico Him, MD  12/15/2019, 10:22 AM

## 2019-12-15 NOTE — Progress Notes (Signed)
Patient due for po amio, heart rate 46-50, bp 12/52. Paged cardiology due to low heart rate, got orders to hold po amio. Will continue to monitor patient.

## 2019-12-16 ENCOUNTER — Encounter: Payer: Self-pay | Admitting: *Deleted

## 2019-12-16 ENCOUNTER — Inpatient Hospital Stay (INDEPENDENT_AMBULATORY_CARE_PROVIDER_SITE_OTHER): Payer: Medicare PPO

## 2019-12-16 ENCOUNTER — Encounter (HOSPITAL_COMMUNITY): Payer: Self-pay | Admitting: Cardiology

## 2019-12-16 ENCOUNTER — Inpatient Hospital Stay (HOSPITAL_COMMUNITY): Payer: Medicare PPO

## 2019-12-16 DIAGNOSIS — I471 Supraventricular tachycardia: Secondary | ICD-10-CM

## 2019-12-16 DIAGNOSIS — R001 Bradycardia, unspecified: Secondary | ICD-10-CM

## 2019-12-16 DIAGNOSIS — I495 Sick sinus syndrome: Secondary | ICD-10-CM

## 2019-12-16 DIAGNOSIS — I4719 Other supraventricular tachycardia: Secondary | ICD-10-CM

## 2019-12-16 DIAGNOSIS — I25119 Atherosclerotic heart disease of native coronary artery with unspecified angina pectoris: Secondary | ICD-10-CM

## 2019-12-16 LAB — LIPID PANEL
Cholesterol: 120 mg/dL (ref 0–200)
HDL: 35 mg/dL — ABNORMAL LOW (ref >40)
LDL Cholesterol: 60 mg/dL (ref 0–99)
Total CHOL/HDL Ratio: 3.4 RATIO
Triglycerides: 125 mg/dL (ref <150)
VLDL: 25 mg/dL (ref 0–40)

## 2019-12-16 LAB — HEPATIC FUNCTION PANEL
ALT: 14 U/L (ref 0–44)
AST: 20 U/L (ref 15–41)
Albumin: 3.1 g/dL — ABNORMAL LOW (ref 3.5–5.0)
Alkaline Phosphatase: 62 U/L (ref 38–126)
Bilirubin, Direct: 0.3 mg/dL — ABNORMAL HIGH (ref 0.0–0.2)
Indirect Bilirubin: 0.7 mg/dL (ref 0.3–0.9)
Total Bilirubin: 1 mg/dL (ref 0.3–1.2)
Total Protein: 5.5 g/dL — ABNORMAL LOW (ref 6.5–8.1)

## 2019-12-16 LAB — GLUCOSE, CAPILLARY
Glucose-Capillary: 107 mg/dL — ABNORMAL HIGH (ref 70–99)
Glucose-Capillary: 135 mg/dL — ABNORMAL HIGH (ref 70–99)

## 2019-12-16 LAB — BASIC METABOLIC PANEL
Anion gap: 8 (ref 5–15)
BUN: 17 mg/dL (ref 8–23)
CO2: 28 mmol/L (ref 22–32)
Calcium: 9.1 mg/dL (ref 8.9–10.3)
Chloride: 102 mmol/L (ref 98–111)
Creatinine, Ser: 0.85 mg/dL (ref 0.61–1.24)
GFR calc Af Amer: >60 mL/min (ref >60)
GFR calc non Af Amer: >60 mL/min (ref >60)
Glucose, Bld: 134 mg/dL — ABNORMAL HIGH (ref 70–99)
Potassium: 3.9 mmol/L (ref 3.5–5.1)
Sodium: 138 mmol/L (ref 135–145)

## 2019-12-16 LAB — MAGNESIUM: Magnesium: 2 mg/dL (ref 1.7–2.4)

## 2019-12-16 LAB — T4, FREE: Free T4: 0.8 ng/dL (ref 0.61–1.12)

## 2019-12-16 MED ORDER — RANOLAZINE ER 500 MG PO TB12: 500.0000 mg | ORAL_TABLET | Freq: Two times a day (BID) | ORAL | 3 refills | Status: DC

## 2019-12-16 MED ORDER — METFORMIN HCL 500 MG PO TABS: 500.0000 mg | ORAL_TABLET | Freq: Two times a day (BID) | ORAL | Status: DC

## 2019-12-16 MED ORDER — ATORVASTATIN CALCIUM 80 MG PO TABS: 80.0000 mg | ORAL_TABLET | Freq: Every day | ORAL | 3 refills | Status: DC

## 2019-12-16 MED ORDER — AMIODARONE HCL 200 MG PO TABS: 200.0000 mg | ORAL_TABLET | Freq: Every day | ORAL | 3 refills | Status: DC

## 2019-12-16 MED ORDER — AMLODIPINE BESYLATE 5 MG PO TABS: 5.0000 mg | ORAL_TABLET | Freq: Every day | ORAL | 3 refills | Status: DC

## 2019-12-16 NOTE — Care Management Important Message (Signed)
Important Message  Patient Details  Name: Alexander Choi MRN: [id number] Date of Birth: 1932/11/26   Medicare Important Message Given:  Yes     Orbie Pyo 12/16/2019, 3:29 PM

## 2019-12-16 NOTE — Plan of Care (Signed)
  Problem: Cardiovascular: Goal: Vascular access site(s) Level 0-1 will be maintained Outcome: Progressing   Problem: Clinical Measurements: Goal: Will remain free from infection Outcome: Progressing Goal: Respiratory complications will improve Outcome: Progressing Goal: Cardiovascular complication will be avoided Outcome: Progressing   Problem: Pain Managment: Goal: General experience of comfort will improve Outcome: Progressing

## 2019-12-16 NOTE — Plan of Care (Signed)

## 2019-12-16 NOTE — TOC Benefit Eligibility Note (Signed)
Transition of Care [facility]) Benefit Eligibility Note    Patient Details  Name: Alexander Choi MRN: [id number] Date of Birth: [birth year]   Medication/Dose: Wilder Glade 10mg   Covered?: Yes  Tier: 3 Drug  Prescription Coverage Preferred Pharmacy: [facility]  Spoke with Person/Company/Phone Number:: [employer]  Co-Pay: $64.00 for 30 day retail.  Prior Approval: No    Additional Notes: if co-pay is to high, doctor can call clinical intake for tier exception [place] Phone Number: 12/16/2019, 11:57 AM

## 2019-12-16 NOTE — Progress Notes (Signed)
Zio patch placed onto patient.  All instructions and information reviewed with patient, they verbalize understanding with no questions. 

## 2019-12-16 NOTE — Progress Notes (Deleted)
Patient ID: Alexander Choi, male   DOB: 11/24/1932, 86 y.o.   MRN: [id number] Patient enrolled for [employer] to ship a 14 day ZIO AT long term monitor-Live Telemetry to his home.  Letter with instructions mailed to the home.

## 2019-12-16 NOTE — Discharge Instructions (Signed)
Femoral Site Care This sheet gives you information about how to care for yourself after your procedure. Your health care provider may also give you more specific instructions. If you have problems or questions, contact your health care provider. What can I expect after the procedure? After the procedure, it is common to have:  Bruising that usually fades within 1-2 weeks.  Tenderness at the site. Follow these instructions at home: Wound care  Follow instructions from your health care provider about how to take care of your insertion site. Make sure you: ? Wash your hands with soap and water before you change your bandage (dressing). If soap and water are not available, use hand sanitizer. ? Change your dressing as told by your health care provider. ? Leave stitches (sutures), skin glue, or adhesive strips in place. These skin closures may need to stay in place for 2 weeks or longer. If adhesive strip edges start to loosen and curl up, you may trim the loose edges. Do not remove adhesive strips completely unless your health care provider tells you to do that.  Do not take baths, swim, or use a hot tub until your health care provider approves.  You may shower 24-48 hours after the procedure or as told by your health care provider. ? Gently wash the site with plain soap and water. ? Pat the area dry with a clean towel. ? Do not rub the site. This may cause bleeding.  Do not apply powder or lotion to the site. Keep the site clean and dry.  Check your femoral site every day for signs of infection. Check for: ? Redness, swelling, or pain. ? Fluid or blood. ? Warmth. ? Pus or a bad smell. Activity  For the first 2-3 days after your procedure, or as long as directed: ? Avoid climbing stairs as much as possible. ? Do not squat.  Do not lift anything that is heavier than 10 lb (4.5 kg), or the limit that you are told, until your health care provider says that it is safe.  Rest as  directed. ? Avoid sitting for a long time without moving. Get up to take short walks every 1-2 hours.  Do not drive for 24 hours if you were given a medicine to help you relax (sedative). General instructions  Take over-the-counter and prescription medicines only as told by your health care provider.  Keep all follow-up visits as told by your health care provider. This is important. Contact a health care provider if you have:  A fever or chills.  You have redness, swelling, or pain around your insertion site. Get help right away if:  The catheter insertion area swells very fast.  You pass out.  You suddenly start to sweat or your skin gets clammy.  The catheter insertion area is bleeding, and the bleeding does not stop when you hold steady pressure on the area.  The area near or just beyond the catheter insertion site becomes pale, cool, tingly, or numb. These symptoms may represent a serious problem that is an emergency. Do not wait to see if the symptoms will go away. Get medical help right away. Call your local emergency services (911 in the [place] Do not drive yourself to the hospital. Summary  After the procedure, it is common to have bruising that usually fades within 1-2 weeks.  Check your femoral site every day for signs of infection.  Do not lift anything that is heavier than 10 lb (4.5 kg), or the   limit that you are told, until your health care provider says that it is safe. This information is not intended to replace advice given to you by your health care provider. Make sure you discuss any questions you have with your health care provider. Document Revised: 06/26/2017 Document Reviewed: 06/26/2017 Elsevier Patient Education  [place] Heart-healthy meal planning includes:  Eating less unhealthy fats.  Eating more healthy fats.  Making other changes in your diet. Talk with your doctor or a diet specialist (dietitian) to  create an eating plan that is right for you. What is my plan? Your doctor may recommend an eating plan that includes:  Total fat: ______% or less of total calories a day.  Saturated fat: ______% or less of total calories a day.  Cholesterol: less than _________mg a day. What are tips for following this plan? Cooking Avoid frying your food. Try to bake, boil, grill, or broil it instead. You can also reduce fat by:  Removing the skin from poultry.  Removing all visible fats from meats.  Steaming vegetables in water or broth. Meal planning   At meals, divide your plate into four equal parts: ? Fill one-half of your plate with vegetables and green salads. ? Fill one-fourth of your plate with whole grains. ? Fill one-fourth of your plate with lean protein foods.  Eat 4-5 servings of vegetables per day. A serving of vegetables is: ? 1 cup of raw or cooked vegetables. ? 2 cups of raw leafy greens.  Eat 4-5 servings of fruit per day. A serving of fruit is: ? 1 medium whole fruit. ?  cup of dried fruit. ?  cup of fresh, frozen, or canned fruit. ?  cup of 100% fruit juice.  Eat more foods that have soluble fiber. These are apples, broccoli, carrots, beans, peas, and barley. Try to get 20-30 g of fiber per day.  Eat 4-5 servings of nuts, legumes, and seeds per week: ? 1 serving of dried beans or legumes equals  cup after being cooked. ? 1 serving of nuts is  cup. ? 1 serving of seeds equals 1 tablespoon. General information  Eat more home-cooked food. Eat less restaurant, buffet, and fast food.  Limit or avoid alcohol.  Limit foods that are high in starch and sugar.  Avoid fried foods.  Lose weight if you are overweight.  Keep track of how much salt (sodium) you eat. This is important if you have high blood pressure. Ask your doctor to tell you more about this.  Try to add vegetarian meals each week. Fats  Choose healthy fats. These include olive oil and canola  oil, flaxseeds, walnuts, almonds, and seeds.  Eat more omega-3 fats. These include salmon, mackerel, sardines, tuna, flaxseed oil, and ground flaxseeds. Try to eat fish at least 2 times each week.  Check food labels. Avoid foods with trans fats or high amounts of saturated fat.  Limit saturated fats. ? These are often found in animal products, such as meats, butter, and cream. ? These are also found in plant foods, such as palm oil, palm kernel oil, and coconut oil.  Avoid foods with partially hydrogenated oils in them. These have trans fats. Examples are stick margarine, some tub margarines, cookies, crackers, and other baked goods. What foods can I eat? Fruits All fresh, canned (in natural juice), or frozen fruits. Vegetables Fresh or frozen vegetables (raw, steamed, roasted, or grilled). Green salads. Grains Most grains. Choose whole  wheat and whole grains most of the time. Rice and pasta, including brown rice and pastas made with whole wheat. Meats and other proteins Lean, well-trimmed beef, veal, pork, and lamb. Chicken and [place] without skin. All fish and shellfish. Wild duck, rabbit, pheasant, and venison. Egg whites or low-cholesterol egg substitutes. Dried beans, peas, lentils, and tofu. Seeds and most nuts. Dairy Low-fat or nonfat cheeses, including ricotta and mozzarella. Skim or 1% milk that is liquid, powdered, or evaporated. Buttermilk that is made with low-fat milk. Nonfat or low-fat yogurt. Fats and oils Non-hydrogenated (trans-free) margarines. Vegetable oils, including soybean, sesame, sunflower, olive, peanut, safflower, corn, canola, and cottonseed. Salad dressings or mayonnaise made with a vegetable oil. Beverages Mineral water. Coffee and tea. Diet carbonated beverages. Sweets and desserts Sherbet, gelatin, and fruit ice. Small amounts of dark chocolate. Limit all sweets and desserts. Seasonings and condiments All seasonings and condiments. The items listed above  may not be a complete list of foods and drinks you can eat. Contact a dietitian for more options. What foods should I avoid? Fruits Canned fruit in heavy syrup. Fruit in cream or butter sauce. Fried fruit. Limit coconut. Vegetables Vegetables cooked in cheese, cream, or butter sauce. Fried vegetables. Grains Breads that are made with saturated or trans fats, oils, or whole milk. Croissants. Sweet rolls. Donuts. High-fat crackers, such as cheese crackers. Meats and other proteins Fatty meats, such as hot dogs, ribs, sausage, bacon, rib-eye roast or steak. High-fat deli meats, such as salami and bologna. Caviar. Domestic duck and goose. Organ meats, such as liver. Dairy Cream, sour cream, cream cheese, and creamed cottage cheese. Whole-milk cheeses. Whole or 2% milk that is liquid, evaporated, or condensed. Whole buttermilk. Cream sauce or high-fat cheese sauce. Yogurt that is made from whole milk. Fats and oils Meat fat, or shortening. Cocoa butter, hydrogenated oils, palm oil, coconut oil, palm kernel oil. Solid fats and shortenings, including bacon fat, salt pork, lard, and butter. Nondairy cream substitutes. Salad dressings with cheese or sour cream. Beverages Regular sodas and juice drinks with added sugar. Sweets and desserts Frosting. Pudding. Cookies. Cakes. Pies. Milk chocolate or white chocolate. Buttered syrups. Full-fat ice cream or ice cream drinks. The items listed above may not be a complete list of foods and drinks to avoid. Contact a dietitian for more information. Summary  Heart-healthy meal planning includes eating less unhealthy fats, eating more healthy fats, and making other changes in your diet.  Eat a balanced diet. This includes fruits and vegetables, low-fat or nonfat dairy, lean protein, nuts and legumes, whole grains, and heart-healthy oils and fats. This information is not intended to replace advice given to you by your health care provider. Make sure you discuss  any questions you have with your health care provider. Document Revised: 08/17/2017 Document Reviewed: 07/21/2017 Elsevier Patient Education  2020 [employer].

## 2019-12-16 NOTE — Discharge Summary (Signed)
Discharge Summary    Patient ID: Alexander Choi MRN: [id number]; DOB: 15-Sep-1932  Admit date: 12/12/2019 Discharge date: 12/16/2019  Primary Care Provider: Nicoletta Dress, MD  Primary Cardiologist: Peter [name], MD  Primary Electrophysiologist:  None   Discharge Diagnoses    Principal Problem:   Unstable angina [facility]) Active Problems:   Coronary artery disease   Hypertension   Hypercholesterolemia   Diabetes mellitus ([place])   Atrial tachycardia ([place])   Bradycardia    Diagnostic Studies/Procedures    Right and left heart cath 12/12/19:  Mid LM lesion is 35% stenosed.  Ost LAD to Mid LAD lesion is 100% stenosed.  Prox Cx lesion is 99% stenosed.  Prox Cx to Mid Cx lesion is 90% stenosed.  1st Mrg lesion is 100% stenosed.  2nd Mrg lesion is 75% stenosed.  Prox RCA to Dist RCA lesion is 100% stenosed.  LIMA graft was visualized by angiography and is normal in caliber.  The graft exhibits no disease.  Dist LAD-1 lesion is 60% stenosed.  Dist LAD-2 lesion is 90% stenosed.  SVG graft was visualized by angiography and is large.  The graft exhibits minimal luminal irregularities.  SVG graft was visualized by angiography and is normal in caliber.  The graft exhibits mild .  The left ventricular systolic function is normal.  LV end diastolic pressure is normal.  The left ventricular ejection fraction is 55-65% by visual estimate.   1. Complex 3 vessel obstructive CAD.  2. Patent LIMA to the LAD. There is severe disease in the distal LAD. The vessel is small in caliber and not suitable for PCI 3. Patent SVG to OM1 4. Patent SVG to RV marginal branch. The RCA is occluded with left to right collaterals from the LCx to the distal RCA 5. Normal LV function 6. Normal LV filling pressures 7. 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 This supplies the  second and third OM branches and collaterals to the distal RCA. This is a potential target for intervention. It will require atherectomy. Given progressive symptoms will admit to telemetry. Start IV heparin. Load with Plavix. Planned staged complex PCI of the LCx tomorrow.   _____________   Aborted cath 12/13/19  Dist LAD-1 lesion is 60% stenosed.  Prox Cx lesion is 99% stenosed.  Prox Cx to Mid Cx lesion is 90% stenosed.  2nd Mrg lesion is 75% stenosed.  Unsuccessful PCI due to inability to cross with a wire.   1. Unsuccessful PCI of the LCx due to inability to cross the lesion with a wire.   Plan: medical therapy. ASA only. Will increase amlodipine to 5 mg daily. Consider Ranexa if symptoms persists. Anticipate DC tomorrow am.   History of Present Illness     Alexander Choi is seen for follow up CAD. He has known CAD with prior CABG in 2006, negative Myoview in October 2015 showed inferior ischemia. Cardiac cath showed all grafts patent. There was severe disease in the native LCx that predated his CABG. Other issues include DM, HTN and HLD.  He was admitted to [facility] in October 2020 with bowel obstruction. He had surgery with lysis of adhesions. Was in hospital for 16 days. One week later he fell in his garage and reinjured his shoulder. Had shoulder surgery in December.   He was seen in the ED in March 2021 with dyspnea and symptoms of severe indigestion.  He has been aggressively treated for indigestion with Tagamet and Protonix without improvement. Notes he had EGD 2 months ago that showed only mild inflammation. States symptoms are worse after eating a meal but also occur with activity. SOB is definitely worse with activity. States he can only swing a gold club 5-6 times before he has to stop. Doesn't take Ntg because it makes him sick. Notes some discomfort in his left arm but has also had carpel tunnel and shoulder surgery done this past year.   He was seen in May by Almyra Deforest PA-C for symptoms more consistent with acid reflux. PPI recommended.  He was seen by Dr. [name] in clinic 12/09/19. He noted that symptoms sounded consistent with GERD/reflux, but since patient had not responded to aggressive GERD treatment and EGD was unremarkable, he recommended angiography.   Hospital Course     Consultants:   CAD s/p CABG 2006 Pt presented for planned right and left heart catheterization on 12/12/19, which revealed severe disease in the ostial native LCx and aneurysmal dilation and heavy calcification in the mid vessel. LCx supplies OM2 and OM3 and collateral tot he distal RCA. This was identified as a potential target for atherectomy. Pt was admitted, heparin and plavix started. He was taken back to the cath lab on 12/13/19 for staged PCI. Unfortunately, Dr. [name] was unable to cross the lesion with a wire and the procedure was aborted. Anti-anginal regimen was increased. Amlodipine increased to 5 mg and ranexa started.   CXR due to diminished breath sounds on left lower lung field revealed stable anatomy, no active cardiopulmonary disease.    Atrial tachycardia Bradycardia Pt had what appeared to be atrial tachycardia Friday night. Amiodarone was started at 200 mg BID in addition to his home lopressor. Unfortunately, he had bradycardia in the 40s at night. Lopressor was discontinued and amiodarone reduced 200 mg daily with improvement in rates to the 50-60s. Given his heart rates, will place a zio patch prior to discharge.    Hypertension Medication changes as above. Pressure is well-controlled.   Hyperlipidemia Will collect updated lipid panel. Continue statin.    DM A1c 6.8% Continue amaryl Consider SLG2 in outpatient setting, pending insurance    Did the patient have an acute coronary syndrome (MI, NSTEMI, STEMI, etc) this admission?:  No                               Did the patient have a percutaneous coronary intervention (stent / angioplasty)?:   No.   _____________  Discharge Vitals Blood pressure (!) 143/64, pulse 61, temperature 98 F (36.7 C), temperature source Oral, resp. rate 18, height 5\' 10"  (1.778 m), weight 80.1 kg, SpO2 96 %.  Filed Weights   12/12/19 0714 12/13/19 0516 12/16/19 0542  Weight: 81.6 kg 81.8 kg 80.1 kg    Physical Exam Constitutional:      Appearance: Normal appearance.  HENT:     Head: Normocephalic.  Neck:     Vascular: No carotid bruit.  Cardiovascular:     Rate and Rhythm: Normal rate and regular rhythm.     Pulses: Normal pulses.     Heart sounds: Normal heart sounds. No murmur heard.   Pulmonary:     Effort: Pulmonary effort is normal.     Comments: Breath sounds diminished on left base Abdominal:     General: Bowel sounds are normal.  Musculoskeletal:  General: No swelling.  Skin:    General: Skin is warm and dry.  Neurological:     Mental Status: He is alert and oriented to person, place, and time.  Psychiatric:        Mood and Affect: Mood normal.   Left radial C/D/I Right groin site covered, bruising noted, small hematoma unchanged with ambulation   Labs & Radiologic Studies    CBC Recent Labs    12/14/19 0411  WBC 8.3  HGB 10.9*  HCT 33.6*  MCV 94.6  PLT 144   Basic Metabolic Panel No results for input(s): NA, K, CL, CO2, GLUCOSE, BUN, CREATININE, CALCIUM, MG, PHOS in the last 72 hours. Liver Function Tests Recent Labs    12/16/19 0011  AST 20  ALT 14  ALKPHOS 62  BILITOT 1.0  PROT 5.5*  ALBUMIN 3.1*   No results for input(s): LIPASE, AMYLASE in the last 72 hours. High Sensitivity Troponin:   No results for input(s): TROPONINIHS in the last 720 hours.  BNP Invalid input(s): POCBNP D-Dimer No results for input(s): DDIMER in the last 72 hours. Hemoglobin A1C No results for input(s): HGBA1C in the last 72 hours. Fasting Lipid Panel No results for input(s): CHOL, HDL, LDLCALC, TRIG, CHOLHDL, LDLDIRECT in the last 72 hours. Thyroid Function  Tests Recent Labs    12/15/19 1115  TSH 6.725*   _____________  CARDIAC CATHETERIZATION  Result Date: 12/13/2019  Dist LAD-1 lesion is 60% stenosed.  Prox Cx lesion is 99% stenosed.  Prox Cx to Mid Cx lesion is 90% stenosed.  2nd Mrg lesion is 75% stenosed.  Unsuccessful PCI due to inability to cross with a wire.  1. Unsuccessful PCI of the LCx due to inability to cross the lesion with a wire. Plan: medical therapy. ASA only. Will increase amlodipine to 5 mg daily. Consider Ranexa if symptoms persists. Anticipate DC tomorrow am.   CARDIAC CATHETERIZATION  Result Date: 12/12/2019  Mid LM lesion is 35% stenosed.  Ost LAD to Mid LAD lesion is 100% stenosed.  Prox Cx lesion is 99% stenosed.  Prox Cx to Mid Cx lesion is 90% stenosed.  1st Mrg lesion is 100% stenosed.  2nd Mrg lesion is 75% stenosed.  Prox RCA to Dist RCA lesion is 100% stenosed.  LIMA graft was visualized by angiography and is normal in caliber.  The graft exhibits no disease.  Dist LAD-1 lesion is 60% stenosed.  Dist LAD-2 lesion is 90% stenosed.  SVG graft was visualized by angiography and is large.  The graft exhibits minimal luminal irregularities.  SVG graft was visualized by angiography and is normal in caliber.  The graft exhibits mild .  The left ventricular systolic function is normal.  LV end diastolic pressure is normal.  The left ventricular ejection fraction is 55-65% by visual estimate.  1. Complex 3 vessel obstructive CAD. 2. Patent LIMA to the LAD. There is severe disease in the distal LAD. The vessel is small in caliber and not suitable for PCI 3. Patent SVG to OM1 4. Patent SVG to RV marginal branch. The RCA is occluded with left to right collaterals from the LCx to the distal RCA 5. Normal LV function 6. Normal LV filling pressures 7. Normal right heart pressures Plan: The native LCX is severely diseased proximally and in the mid vessel with aneurysmal dilation and heavy calcification. This supplies  the second and third OM branches and collaterals to the distal RCA. This is a potential target for intervention. It will  require atherectomy. Given progressive symptoms will admit to telemetry. Start IV heparin. Load with Plavix. Planned staged complex PCI of the LCx tomorrow.   ABORTED INVASIVE LAB PROCEDURE  Result Date: 12/13/2019  Dist LAD-1 lesion is 60% stenosed.  Prox Cx lesion is 99% stenosed.  Prox Cx to Mid Cx lesion is 90% stenosed.  2nd Mrg lesion is 75% stenosed.  Unsuccessful PCI due to inability to cross with a wire.  1. Unsuccessful PCI of the LCx due to inability to cross the lesion with a wire. Plan: medical therapy. ASA only. Will increase amlodipine to 5 mg daily. Consider Ranexa if symptoms persists. Anticipate DC tomorrow am.   Disposition   Pt is being discharged home today in good condition.  Follow-up Plans & Appointments     Discharge Instructions    Amb Referral to Cardiac Rehabilitation   Complete by: As directed    Diagnosis: Stable Angina   After initial evaluation and assessments completed: Virtual Based Care may be provided alone or in conjunction with Phase 2 Cardiac Rehab based on patient barriers.: Yes   Diet - low sodium heart healthy   Complete by: As directed    Discharge instructions   Complete by: As directed    No driving for 2 days. No lifting over 5 lbs for 1 week. No sexual activity for 1 week. Keep procedure site clean & dry. If you notice increased pain, swelling, bleeding or pus, call/return!  You may shower, but no soaking baths/hot tubs/pools for 1 week.   Increase activity slowly   Complete by: As directed       Discharge Medications   Allergies as of 12/16/2019      Reactions   Ace Inhibitors Other (See Comments)   unknown   Nitrates, Organic Swelling   Swelling in hands   Shellfish Allergy Nausea Only   Pt states scallops-not all shellfish-GI symptoms/ nausea -50+ years ago   Penicillins Rash      Medication List      STOP taking these medications   metoprolol succinate 25 MG 24 hr tablet Commonly known as: TOPROL-XL     TAKE these medications   Accu-Chek Aviva Plus test strip Generic drug: glucose blood   Accu-Chek Softclix Lancets lancets   Accu-Chek Softclix Lancets lancets   amiodarone 200 MG tablet Commonly known as: PACERONE Take 1 tablet (200 mg total) by mouth daily. Start taking on: December 17, 2019   amLODipine 5 MG tablet Commonly known as: NORVASC Take 1 tablet (5 mg total) by mouth daily. Start taking on: December 17, 2019 What changed:   medication strength  how much to take   aspirin 81 MG tablet Take 81 mg by mouth daily.   atorvastatin 80 MG tablet Commonly known as: LIPITOR Take 1 tablet (80 mg total) by mouth daily. What changed:   medication strength  how much to take   cimetidine 400 MG tablet Commonly known as: TAGAMET Take 400 mg by mouth daily as needed (heartburn).   gabapentin 100 MG capsule Commonly known as: NEURONTIN Take 100 mg by mouth 2 (two) times daily.   glimepiride 4 MG tablet Commonly known as: AMARYL Take 4 mg by mouth in the morning and at bedtime.   Hyoscyamine Sulfate SL 0.125 MG Subl Take 0.125 mg by mouth daily as needed (Diarrhea).   ICAPS AREDS 2 PO Take 1 Cartridge by mouth in the morning and at bedtime.   losartan 100 MG tablet Commonly known as: COZAAR Take  100 mg by mouth daily.   metFORMIN 500 MG tablet Commonly known as: GLUCOPHAGE Take 1 tablet (500 mg total) by mouth 2 (two) times daily with a meal.   nitroGLYCERIN 0.4 MG SL tablet Commonly known as: NITROSTAT Place 1 tablet (0.4 mg total) under the tongue every 5 (five) minutes as needed for chest pain.   NONFORMULARY OR COMPOUNDED ITEM [facility]:  Peripheral Neuropathy Cream - Bupivacaine 1%, doxepin 3%, Gabapentin 6%, Pentoxifylline 3%, Topiramate 1%, apply 1-2 grams to affected area 3-4 times daily. What changed:   how much to take  how to take  this  when to take this   [place] Take 2 capsules by mouth daily. N[employer]renew Neuropathy Support Formula (B1 & B12 Vitamins)   pantoprazole 40 MG tablet Commonly known as: PROTONIX Take 1 tablet (40 mg total) by mouth daily. What changed: when to take this   ranolazine 500 MG 12 hr tablet Commonly known as: RANEXA Take 1 tablet (500 mg total) by mouth 2 (two) times daily.          Outstanding Labs/Studies   Zio patch  Duration of Discharge Encounter   Greater than 30 minutes including physician time.  Signed, Tami Lin Jenae Tomasello, PA 12/16/2019, 11:57 AM

## 2019-12-16 NOTE — Care Management (Signed)
Case manager confirmed with patient that he can afford his [place]. Benefit check was completed this am, cost will be $64.00 for a 30 day supply. He states that is not unreasonable. Case Manager notified MD and bedside RN. No further needs identified. Ricki Miller, RN BSN Case Manager [phone]

## 2019-12-16 NOTE — Progress Notes (Signed)
CARDIAC REHAB PHASE I   Offered to walk with pt, pt declining stating he has been CP free since the weekend. Pt states improvement of symptoms, hoping new medication regiment will continue to work. Is aware of need to contact office if symptoms return. Reviewed restrictions and exercise guidelines. Pt referred to CRP II [place].  [id number] Rufina Falco, RN BSN 12/16/2019 10:54 AM

## 2019-12-17 DIAGNOSIS — R000 Tachycardia, unspecified: Secondary | ICD-10-CM | POA: Diagnosis not present

## 2019-12-20 ENCOUNTER — Telehealth (HOSPITAL_COMMUNITY): Payer: Self-pay

## 2019-12-20 NOTE — Telephone Encounter (Signed)
Faxed referral for Phase II cardiac rehab to [facility].

## 2019-12-23 ENCOUNTER — Telehealth: Payer: Self-pay | Admitting: Physician Assistant

## 2019-12-23 DIAGNOSIS — I471 Supraventricular tachycardia: Secondary | ICD-10-CM | POA: Diagnosis not present

## 2019-12-23 DIAGNOSIS — I25810 Atherosclerosis of coronary artery bypass graft(s) without angina pectoris: Secondary | ICD-10-CM | POA: Diagnosis not present

## 2019-12-23 DIAGNOSIS — E785 Hyperlipidemia, unspecified: Secondary | ICD-10-CM | POA: Diagnosis not present

## 2019-12-23 DIAGNOSIS — K219 Gastro-esophageal reflux disease without esophagitis: Secondary | ICD-10-CM | POA: Diagnosis not present

## 2019-12-23 DIAGNOSIS — I10 Essential (primary) hypertension: Secondary | ICD-10-CM | POA: Diagnosis not present

## 2019-12-23 NOTE — Telephone Encounter (Signed)
[place] Clinical review nurse for [facility] calling in regards to a prior authorization for the patient's heart monitor. Patients member ID number: [id number]

## 2019-12-24 ENCOUNTER — Telehealth (HOSPITAL_COMMUNITY): Payer: Self-pay

## 2019-12-24 NOTE — Telephone Encounter (Signed)
Cardiac Rehab Note:  Successful telephone encounter to [employer] [place] to follow up on Cardiac Rehab referral. Mr. Bartoszek states he would like for referral to be sent to [place]. Support staff notified.  Sunshine Mackowski E. Rollene Rotunda RN, BSN [place]. [facility]  Cardiac and Pulmonary Rehabilitation Phone: [phone] Fax: [phone]

## 2019-12-24 NOTE — Telephone Encounter (Signed)
Cardiac Rehab Note:   Unsuccessful telephone call to [facility] to confirm his location of choice for outpatient cardiac rehab referral. Per patients son, Zoe Nordin, patient is unavailable to take call, however son was able to provide two patient identifiers and is listed on DPR. Son states patient would most likely prefer to attend [place] as it is much closer than [facility] or [facility]. He request CR RN call patient back later today to confirm selection.  Plan: Will follow up with patient later today as requested  Jackson Fetters E. Rollene Rotunda RN, BSN [place]. [facility]  Cardiac and Pulmonary Rehabilitation Phone: [phone] Fax: [phone]

## 2019-12-25 DIAGNOSIS — K219 Gastro-esophageal reflux disease without esophagitis: Secondary | ICD-10-CM | POA: Diagnosis not present

## 2019-12-25 DIAGNOSIS — R0789 Other chest pain: Secondary | ICD-10-CM | POA: Diagnosis not present

## 2019-12-25 DIAGNOSIS — K5900 Constipation, unspecified: Secondary | ICD-10-CM | POA: Diagnosis not present

## 2020-01-03 NOTE — Progress Notes (Signed)
Alexander Choi Date of Birth: Jul 02, 1932 Medical Record #[id number]  History of Present Illness: Mr. Deery is seen for follow up CAD. He has known CAD with prior CABG in 18-Sep-2004, negative Myoview in October 2015 showed inferior ischemia. Cardiac cath showed all grafts patent. There was severe disease in the native LCx that predated his CABG. Other issues include DM, HTN and HLD.   On follow up today he is doing well from a cardiac standpoint.  He denies any dyspnea or chest pain. He is taking Tagamet for some heartburn. His wife passed away in 09/18/2017. He was admitted to [facility] in October 2020 with bowel obstruction. He had surgery with lysis of adhesions. Was in hospital for 16 days. One week later he fell in his garage and reinjured his shoulder. Had shoulder surgery in December.   He was seen in the ED in 09/19/22 with dyspnea and symptoms of severe indigestion. He has been aggressively treated for indigestion with Tagamet and Protonix without improvement. Notes he had EGD 2 months ago that showed only mild inflammation. States symptoms are worse after eating a meal but also occur with activity. SOB is definitely worse with activity. States he can only swing a gold club 5-6 times before he has to stop. Doesn't take Ntg because it makes him sick. Notes some discomfort in his left arm but has also had carpel tunnel and shoulder surgery done this past year.  Because of this we performed cardiac cath. Right heart pressures were normal. His angiogram was stable. We did attempt to open the chronically occluded LCx but were unsuccessful due to inability to cross the lesion with a wire. In retrospect this vessel was really unchanged dating back before his CABG.  Last week he was having trouble with low BP amlodipine was stopped last week. Blood sugar has also been low in the 70s. This am he called EMS out to his house due to acute onset of SOB last night. Also feels weak and off balance. He has worn an  event monitor that is still being processed.   Current Outpatient Medications  Medication Sig Dispense Refill  . Accu-Chek Softclix Lancets lancets     . Accu-Chek Softclix Lancets lancets     . amiodarone (PACERONE) 200 MG tablet Take 1 tablet (200 mg total) by mouth daily. 90 tablet 3  . amLODipine (NORVASC) 5 MG tablet Take 1 tablet (5 mg total) by mouth daily. 90 tablet 3  . aspirin 81 MG tablet Take 81 mg by mouth daily.      Marland Kitchen atorvastatin (LIPITOR) 80 MG tablet Take 1 tablet (80 mg total) by mouth daily. 90 tablet 3  . cimetidine (TAGAMET) 400 MG tablet Take 400 mg by mouth daily as needed (heartburn).     . gabapentin (NEURONTIN) 100 MG capsule Take 100 mg by mouth 2 (two) times daily.     Marland Kitchen glimepiride (AMARYL) 4 MG tablet Take 4 mg by mouth every morning.    Marland Kitchen glucose blood (ACCU-CHEK AVIVA PLUS) test strip     . Hyoscyamine Sulfate SL 0.125 MG SUBL Take 0.125 mg by mouth daily as needed (Diarrhea).     . losartan (COZAAR) 100 MG tablet Take 100 mg by mouth daily.     . metFORMIN (GLUCOPHAGE) 500 MG tablet Take 1 tablet (500 mg total) by mouth 2 (two) times daily with a meal.    . Multiple Vitamins-Minerals (ICAPS AREDS 2 PO) Take 1 Cartridge by mouth in the morning  and at bedtime.    . nitroGLYCERIN (NITROSTAT) 0.4 MG SL tablet Place 1 tablet (0.4 mg total) under the tongue every 5 (five) minutes as needed for chest pain. 90 tablet 3  . NONFORMULARY OR COMPOUNDED ITEM [facility]:  Peripheral Neuropathy Cream - Bupivacaine 1%, doxepin 3%, Gabapentin 6%, Pentoxifylline 3%, Topiramate 1%, apply 1-2 grams to affected area 3-4 times daily. (Patient taking differently: Apply 1 application topically once a week. [facility]:  Peripheral Neuropathy Cream - Bupivacaine 1%, doxepin 3%, Gabapentin 6%, Pentoxifylline 3%, Topiramate 1%, apply 1-2 grams to affected area 3-4 times daily.) 120 each 3  . OVER THE COUNTER MEDICATION Take 2 capsules by mouth daily. [employer] Neuropathy  Support Formula (B1 & B12 Vitamins)    . pantoprazole (PROTONIX) 40 MG tablet Take 1 tablet (40 mg total) by mouth daily. (Patient taking differently: Take 40 mg by mouth 2 (two) times daily. ) 30 tablet 0  . ranolazine (RANEXA) 500 MG 12 hr tablet Take 1 tablet (500 mg total) by mouth 2 (two) times daily. 180 tablet 3   Current Facility-Administered Medications  Medication Dose Route Frequency Provider Last Rate Last Admin  . sodium chloride flush (NS) 0.9 % injection 3 mL  3 mL Intravenous Q12H [name], Cason Luffman M, MD        Allergies  Allergen Reactions  . Ace Inhibitors Other (See Comments)    unknown  . Nitrates, Organic Swelling    Swelling in hands  . Shellfish Allergy Nausea Only    Pt states scallops-not all shellfish-GI symptoms/ nausea -50+ years ago  . Penicillins Rash    Past Medical History:  Diagnosis Date  . Cancer ([place])   . Chronic kidney disease    kidney stones  . Coronary artery disease    with CABG in 2006 with LIMA to LAD, SVG to mid and distal OM, SVG to AM. Normal Myoview in May of 2013  . Diabetes mellitus    Type 2  . GERD (gastroesophageal reflux disease)   . Hematuria   . Hypercholesterolemia   . Hypertension   . Irritable bowel syndrome   . Loose stools   . Prostate cancer ([place])   . Urinary obstruction     Past Surgical History:  Procedure Laterality Date  . CHOLECYSTECTOMY    . CORONARY ARTERY BYPASS GRAFT  2006  . LEFT HEART CATHETERIZATION WITH CORONARY/GRAFT ANGIOGRAM N/A 04/11/2014   Procedure: LEFT HEART CATHETERIZATION WITH [name];  Surgeon: Zaylah Blecha M [name], MD;  Location: [facility] CATH LAB;  Service: Cardiovascular;  Laterality: N/A;  . OTHER SURGICAL HISTORY     bowel obstruction  . RADIOACTIVE SEED IMPLANT     for prostate cancer  . RIGHT/LEFT HEART CATH AND CORONARY/GRAFT ANGIOGRAPHY N/A 12/12/2019   Procedure: RIGHT/LEFT HEART CATH AND CORONARY/GRAFT ANGIOGRAPHY;  Surgeon: [name], Cattaleya Wien M, MD;  Location: [facility] CV  LAB;  Service: Cardiovascular;  Laterality: N/A;  . SKIN CANCER EXCISION    . TEMPORARY PACEMAKER N/A 12/13/2019   Procedure: TEMPORARY PACEMAKER;  Surgeon: [name], Shawnia Vizcarrondo M, MD;  Location: [facility] CV LAB;  Service: Cardiovascular;  Laterality: N/A;    Social History   Tobacco Use  Smoking Status Former Smoker  . Packs/day: 3.00  Smokeless Tobacco Never Used    Social History   Substance and Sexual Activity  Alcohol Use No    Family History  Problem Relation Age of Onset  . Cancer Mother        Leukemia  . Hypertension  Mother   . Cancer Father        Colon  . Heart attack Father        Had Several in his 19's  . Hypertension Father   . Diabetes Father     Review of Systems: The review of systems is per the HPI.  All other systems were reviewed and are negative.  Physical Exam: BP 117/71   Pulse 62   Temp (!) 96.8 F (36 C)   Ht 5\' 9"  (1.753 m)   Wt 176 lb (79.8 kg)   SpO2 92%   BMI 25.99 kg/m  GENERAL:  Well appearing elderly WM HEENT:  PERRL, EOMI, sclera are clear. Oropharynx is clear. NECK:  No jugular venous distention, carotid upstroke brisk and symmetric, no bruits, no thyromegaly or adenopathy LUNGS:  Decreased BS in bases L>R. No rales.  CHEST:  Unremarkable HEART:  RRR,  PMI not displaced or sustained,S1 and S2 within normal limits, no S3, no S4: no clicks, no rubs, no murmurs ABD:  Soft, nontender. BS +, no masses or bruits. No hepatomegaly, no splenomegaly EXT:  2 + pulses throughout, no edema, no cyanosis no clubbing. Brace on left ankle.  SKIN:  Warm and dry.  No rashes NEURO:  Alert and oriented x 3. Cranial nerves II through XII intact. PSYCH:  Cognitively intact   Wt Readings from Last 3 Encounters:  01/07/20 176 lb (79.8 kg)  12/16/19 176 lb 10.5 oz (80.1 kg)  12/09/19 185 lb 3.2 oz (84 kg)    LABORATORY DATA/PROCEDURES:  Lab Results  Component Value Date   WBC 8.3 12/14/2019   HGB 10.9 (L) 12/14/2019   HCT 33.6 (L) 12/14/2019    PLT 173 12/14/2019   GLUCOSE 134 (H) 12/16/2019   CHOL 120 12/16/2019   TRIG 125 12/16/2019   HDL 35 (L) 12/16/2019   LDLCALC 60 12/16/2019   ALT 14 12/16/2019   AST 20 12/16/2019   NA 138 12/16/2019   K 3.9 12/16/2019   CL 102 12/16/2019   CREATININE 0.85 12/16/2019   BUN 17 12/16/2019   CO2 28 12/16/2019   TSH 6.725 (H) 12/15/2019   INR 0.96 04/08/2014   HGBA1C 6.8 (H) 12/12/2019    BNP (last 3 results) No results for input(s): PROBNP in the last 8760 hours.  Labs reviewed from 01/04/16: cholesterol 123, triglycerides 151, HDL 36, LDL 57. CBC and CMET normal.  Dated 07/08/16: cholesterol 116, triglycerides 205, HDL 30, LDL 45. CMET and CBC normal. Dated 01/18/17: Hgb 12.2. CMET normal. Cholesterol 105, triglycerides 152, HDL 28, LDL 47. A1c 6.3%. Dated 07/25/17: Hgb 12.5. Glucose 124. Other chemistries normal. Cholesterol 107, Triglycerides 139, HDL 29, LDL 50. A1c 7.2%. Iron studies normal.  Dated 5.1.19: cholesterol 107, triglycerides 109, HDL 29, LDL 56. A1c 7.5%. CBC and chemistries normal.  Dated 05/29/19: cholesterol 142, triglycerides 207, HDL 33, LDL 54. A1c 6.9%. CBC and CMET normal. Dated 12/03/19: Hgb 12. A1c 6.7%. otherwise CBC and CMET normal. Iron studies normal. Cholesterol 122, triglycerides 118, HDL 35, LDL 65.   Cardiac cath 12/12/19:  RIGHT/LEFT HEART CATH AND CORONARY/GRAFT ANGIOGRAPHY  Conclusion    Mid LM lesion is 35% stenosed.  Ost LAD to Mid LAD lesion is 100% stenosed.  Prox Cx lesion is 99% stenosed.  Prox Cx to Mid Cx lesion is 90% stenosed.  1st Mrg lesion is 100% stenosed.  2nd Mrg lesion is 75% stenosed.  Prox RCA to Dist RCA lesion is 100% stenosed.  LIMA graft  was visualized by angiography and is normal in caliber.  The graft exhibits no disease.  Dist LAD-1 lesion is 60% stenosed.  Dist LAD-2 lesion is 90% stenosed.  SVG graft was visualized by angiography and is large.  The graft exhibits minimal luminal  irregularities.  SVG graft was visualized by angiography and is normal in caliber.  The graft exhibits mild .  The left ventricular systolic function is normal.  LV end diastolic pressure is normal.  The left ventricular ejection fraction is 55-65% by visual estimate.   1. Complex 3 vessel obstructive CAD.  2. Patent LIMA to the LAD. There is severe disease in the distal LAD. The vessel is small in caliber and not suitable for PCI 3. Patent SVG to OM1 4. Patent SVG to RV marginal branch. The RCA is occluded with left to right collaterals from the LCx to the distal RCA 5. Normal LV function 6. Normal LV filling pressures 7. 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 This supplies the second and third OM branches and collaterals to the distal RCA. This is a potential target for intervention. It will require atherectomy. Given progressive symptoms will admit to telemetry. Start IV heparin. Load with Plavix. Planned staged complex PCI of the LCx tomorrow.   Attempted PCI 12/13/19:  TEMPORARY PACEMAKER  INVASIVE LAB ABORTED CASE  Conclusion    Dist LAD-1 lesion is 60% stenosed.  Prox Cx lesion is 99% stenosed.  Prox Cx to Mid Cx lesion is 90% stenosed.  2nd Mrg lesion is 75% stenosed.  Unsuccessful PCI due to inability to cross with a wire.   1. Unsuccessful PCI of the LCx due to inability to cross the lesion with a wire.   Plan: medical therapy. ASA only. Will increase amlodipine to 5 mg daily. Consider Ranexa if symptoms persists. Anticipate DC tomorrow am.  Assessment / Plan: 1. CAD- s/p CABG 2006. He is asymptomatic.  Grafts are patent by cath in 2015. SVG to OM1, SVG to RV marginal branch and LIMA to LAD. He did have complex severe disease in the LCx and in the mid RCA that were not grafted. Recent repeat cardiac cath was stable. Attempt was made to open the  LCx but we were unable to cross the lesion with a wire despite multiple attempts. The RCA was chronically occluded and not suitable for PCI. Will continue Ranexa and stop amlodipine due to hypotension.  2. Hyperlipidemia. On statin. Excellent control.  3. DM type 2. A1c is improved. Recent hypoglycemia. Recommend reducing amaryl to 4 mg daily.   4. HTN - well controlled. Improved with stopping amlodipine.   5. Atrial tachycardia. Noted in hospital. Started on amiodarone. Event monitor pending.   6. Acute SOB. Cannot explain from a cardiac standpoint. Recommend chest CT with contrast to rule out PE.   7. GERD  Follow up 3 months.

## 2020-01-07 ENCOUNTER — Other Ambulatory Visit: Payer: Self-pay

## 2020-01-07 ENCOUNTER — Encounter: Payer: Self-pay | Admitting: Cardiology

## 2020-01-07 ENCOUNTER — Ambulatory Visit: Payer: Medicare PPO | Admitting: Cardiology

## 2020-01-07 VITALS — BP 117/71 | HR 62 | Temp 96.8°F | Ht 69.0 in | Wt 176.0 lb

## 2020-01-07 DIAGNOSIS — E7800 Pure hypercholesterolemia, unspecified: Secondary | ICD-10-CM

## 2020-01-07 DIAGNOSIS — I10 Essential (primary) hypertension: Secondary | ICD-10-CM

## 2020-01-07 DIAGNOSIS — R0602 Shortness of breath: Secondary | ICD-10-CM | POA: Diagnosis not present

## 2020-01-07 DIAGNOSIS — E119 Type 2 diabetes mellitus without complications: Secondary | ICD-10-CM

## 2020-01-07 DIAGNOSIS — I25709 Atherosclerosis of coronary artery bypass graft(s), unspecified, with unspecified angina pectoris: Secondary | ICD-10-CM | POA: Diagnosis not present

## 2020-01-07 DIAGNOSIS — I471 Supraventricular tachycardia: Secondary | ICD-10-CM

## 2020-01-07 NOTE — Patient Instructions (Signed)
Reduce glimiperide to 4 mg daily after breakfast  Continue your other medication  We will schedule you for a lung scan/CT

## 2020-01-08 ENCOUNTER — Ambulatory Visit (INDEPENDENT_AMBULATORY_CARE_PROVIDER_SITE_OTHER)
Admission: RE | Admit: 2020-01-08 | Discharge: 2020-01-08 | Disposition: A | Discharge status: Home / Self Care | Payer: Medicare PPO | Source: Ambulatory Visit | Attending: Cardiology | Admitting: Cardiology

## 2020-01-08 ENCOUNTER — Emergency Department (HOSPITAL_COMMUNITY): Payer: Medicare PPO

## 2020-01-08 ENCOUNTER — Encounter (HOSPITAL_COMMUNITY): Payer: Self-pay | Admitting: Emergency Medicine

## 2020-01-08 ENCOUNTER — Emergency Department (HOSPITAL_COMMUNITY)
Admission: EM | Admit: 2020-01-08 | Discharge: 2020-01-09 | Disposition: A | Discharge status: Home / Self Care | Payer: Medicare PPO | Attending: Emergency Medicine | Admitting: Emergency Medicine

## 2020-01-08 ENCOUNTER — Telehealth: Payer: Self-pay | Admitting: *Deleted

## 2020-01-08 DIAGNOSIS — Z7984 Long term (current) use of oral hypoglycemic drugs: Secondary | ICD-10-CM | POA: Diagnosis not present

## 2020-01-08 DIAGNOSIS — N189 Chronic kidney disease, unspecified: Secondary | ICD-10-CM | POA: Insufficient documentation

## 2020-01-08 DIAGNOSIS — R0602 Shortness of breath: Secondary | ICD-10-CM

## 2020-01-08 DIAGNOSIS — R079 Chest pain, unspecified: Secondary | ICD-10-CM

## 2020-01-08 DIAGNOSIS — I2510 Atherosclerotic heart disease of native coronary artery without angina pectoris: Secondary | ICD-10-CM | POA: Insufficient documentation

## 2020-01-08 DIAGNOSIS — I129 Hypertensive chronic kidney disease with stage 1 through stage 4 chronic kidney disease, or unspecified chronic kidney disease: Secondary | ICD-10-CM | POA: Diagnosis not present

## 2020-01-08 DIAGNOSIS — Z87891 Personal history of nicotine dependence: Secondary | ICD-10-CM | POA: Diagnosis not present

## 2020-01-08 DIAGNOSIS — E1165 Type 2 diabetes mellitus with hyperglycemia: Secondary | ICD-10-CM | POA: Diagnosis not present

## 2020-01-08 DIAGNOSIS — Z7982 Long term (current) use of aspirin: Secondary | ICD-10-CM | POA: Insufficient documentation

## 2020-01-08 DIAGNOSIS — Z8546 Personal history of malignant neoplasm of prostate: Secondary | ICD-10-CM | POA: Insufficient documentation

## 2020-01-08 DIAGNOSIS — Z79899 Other long term (current) drug therapy: Secondary | ICD-10-CM | POA: Insufficient documentation

## 2020-01-08 DIAGNOSIS — R0789 Other chest pain: Secondary | ICD-10-CM | POA: Diagnosis not present

## 2020-01-08 DIAGNOSIS — I10 Essential (primary) hypertension: Secondary | ICD-10-CM | POA: Diagnosis not present

## 2020-01-08 LAB — CBC
HCT: 36.6 % — ABNORMAL LOW (ref 39.0–52.0)
Hemoglobin: 12.1 g/dL — ABNORMAL LOW (ref 13.0–17.0)
MCH: 30.6 pg (ref 26.0–34.0)
MCHC: 33.1 g/dL (ref 30.0–36.0)
MCV: 92.7 fL (ref 80.0–100.0)
Platelets: 230 10*3/uL (ref 150–400)
RBC: 3.95 MIL/uL — ABNORMAL LOW (ref 4.22–5.81)
RDW: 12.8 % (ref 11.5–15.5)
WBC: 7.3 10*3/uL (ref 4.0–10.5)
nRBC: 0 % (ref 0.0–0.2)

## 2020-01-08 LAB — BASIC METABOLIC PANEL
Anion gap: 12 (ref 5–15)
BUN: 16 mg/dL (ref 8–23)
CO2: 26 mmol/L (ref 22–32)
Calcium: 9.5 mg/dL (ref 8.9–10.3)
Chloride: 97 mmol/L — ABNORMAL LOW (ref 98–111)
Creatinine, Ser: 0.71 mg/dL (ref 0.61–1.24)
GFR calc Af Amer: >60 mL/min (ref >60)
GFR calc non Af Amer: >60 mL/min (ref >60)
Glucose, Bld: 104 mg/dL — ABNORMAL HIGH (ref 70–99)
Potassium: 4.2 mmol/L (ref 3.5–5.1)
Sodium: 135 mmol/L (ref 135–145)

## 2020-01-08 LAB — TROPONIN I (HIGH SENSITIVITY)
Troponin I (High Sensitivity): 15 ng/L (ref <18)
Troponin I (High Sensitivity): 13 ng/L (ref <18)

## 2020-01-08 MED ORDER — IOHEXOL 350 MG/ML SOLN
80.0000 mL | Freq: Once | INTRAVENOUS | Status: AC | PRN
  Administered 2020-01-08: 80 mL via INTRAVENOUS

## 2020-01-08 MED ORDER — SODIUM CHLORIDE 0.9% FLUSH: 3.0000 mL | Freq: Once | INTRAVENOUS | Status: DC

## 2020-01-08 NOTE — Telephone Encounter (Signed)
Spoke with patient regarding CTA chest PE protocol scheduled 01/08/20 @ 9:30 am at [facility]----[address]----patient voiced his understanding

## 2020-01-08 NOTE — ED Triage Notes (Signed)
BIB EMS from home, reports intermittent CP X2 days. Took ASA PTA. Patient in NAD.

## 2020-01-09 DIAGNOSIS — R0789 Other chest pain: Secondary | ICD-10-CM | POA: Diagnosis not present

## 2020-01-09 MED ORDER — ALUM & MAG HYDROXIDE-SIMETH 200-200-20 MG/5ML PO SUSP
30.0000 mL | Freq: Once | ORAL | Status: AC
  Administered 2020-01-09: 30 mL via ORAL
  Filled 2020-01-09: qty 30

## 2020-01-09 MED ORDER — LIDOCAINE VISCOUS HCL 2 % MT SOLN
15.0000 mL | Freq: Once | OROMUCOSAL | Status: AC
  Administered 2020-01-09: 15 mL via ORAL
  Filled 2020-01-09: qty 15

## 2020-01-09 NOTE — Discharge Instructions (Addendum)
Please continue your medications as prescribed.  I recommend close follow-up with your primary care provider as well as your cardiologist.  If you develop worsening chest pain that feels like a tightness or pressure, worsening shortness of breath, sudden sweating with nausea or vomiting, dizziness and feel like you are going to pass out or you do pass out, please return to the emergency department immediately.

## 2020-01-09 NOTE — ED Provider Notes (Addendum)
PlusTIME SEEN: 2:50 AM  CHIEF COMPLAINT: chest pain, SOB  HPI: Patient is an 86 year old male with history of CAD status post CABG, hypertension, hyperlipidemia who presents to the emergency department with complaints of chest pain that started yesterday at 3:30 PM.  States that it is a left-sided pressure, tightness without radiation.  He states he does have increased burping and belching with this.  He reports he has had shortness of breath with exertion for the past 2 months and discussed this with his cardiologist, Dr. [name], in the office 2 days ago.  He underwent a CTA of his chest as an outpatient which showed no PE or other acute abnormality.  He states he was not having any chest pain when he saw Dr. [name] but it started yesterday afternoon at rest.  He is not sure if exertion would make it worse.  It is not worse with deep inspiration.  He thinks it may get worse after eating food but he is also unsure and states he does not know if this feels like his previous episodes of indigestion.  He denies any fevers or cough.  No lower extremity swelling or pain.  No associated nausea, dizziness, diaphoresis.  He reports his son gave him aspirin at home and pain has improved.  He reports he is unable to tolerate nitroglycerin.  He was recently taken off of amlodipine secondary to hypotension.  He is on Ranexa 500 mg twice daily and reports compliance.  Was recently started on this medication in June after unsuccessful catheterization of his left circumflex.   Cath 12/12/19:   Mid LM lesion is 35% stenosed.  Ost LAD to Mid LAD lesion is 100% stenosed.  Prox Cx lesion is 99% stenosed.  Prox Cx to Mid Cx lesion is 90% stenosed.  1st Mrg lesion is 100% stenosed.  2nd Mrg lesion is 75% stenosed.  Prox RCA to Dist RCA lesion is 100% stenosed.  LIMA graft was visualized by angiography and is normal in caliber.  The graft exhibits no disease.  Dist LAD-1 lesion is 60% stenosed.  Dist LAD-2  lesion is 90% stenosed.  SVG graft was visualized by angiography and is large.  The graft exhibits minimal luminal irregularities.  SVG graft was visualized by angiography and is normal in caliber.  The graft exhibits mild .  The left ventricular systolic function is normal.  LV end diastolic pressure is normal.  The left ventricular ejection fraction is 55-65% by visual estimate.   1. Complex 3 vessel obstructive CAD.  2. Patent LIMA to the LAD. There is severe disease in the distal LAD. The vessel is small in caliber and not suitable for PCI 3. Patent SVG to OM1 4. Patent SVG to RV marginal branch. The RCA is occluded with left to right collaterals from the LCx to the distal RCA 5. Normal LV function 6. Normal LV filling pressures 7. 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 This supplies the second and third OM branches and collaterals to the distal RCA. This is a potential target for intervention. It will require atherectomy. Given progressive symptoms will admit to telemetry. Start IV heparin. Load with Plavix. Planned staged complex PCI of the LCx tomorrow.   Cath 12/13/19:   Dist LAD-1 lesion is 60% stenosed.  Prox Cx lesion is 99% stenosed.  Prox Cx to Mid Cx lesion is 90% stenosed.  2nd Mrg lesion  is 75% stenosed.  Unsuccessful PCI due to inability to cross with a wire.   1. Unsuccessful PCI of the LCx due to inability to cross the lesion with a wire.   Plan: medical therapy. ASA only. Will increase amlodipine to 5 mg daily. Consider Ranexa if symptoms persists. Anticipate DC tomorrow am.     ROS: See HPI Constitutional: no fever  Eyes: no drainage  ENT: no runny nose   Cardiovascular:   chest pain  Resp:  SOB  GI: no vomiting GU: no dysuria Integumentary: no rash  Allergy: no hives  Musculoskeletal: no leg swelling  Neurological: no  slurred speech ROS otherwise negative  PAST MEDICAL HISTORY/PAST SURGICAL HISTORY:  Past Medical History:  Diagnosis Date  . Cancer ([place])   . Chronic kidney disease    kidney stones  . Coronary artery disease    with CABG in 2006 with LIMA to LAD, SVG to mid and distal OM, SVG to AM. Normal Myoview in May of 2013  . Diabetes mellitus    Type 2  . GERD (gastroesophageal reflux disease)   . Hematuria   . Hypercholesterolemia   . Hypertension   . Irritable bowel syndrome   . Loose stools   . Prostate cancer ([place])   . Urinary obstruction     MEDICATIONS:  Prior to Admission medications   Medication Sig Start Date End Date Taking? Authorizing Provider  Accu-Chek Softclix Lancets lancets  07/04/19   [provider]  Accu-Chek Softclix Lancets lancets  12/23/18   [provider]  amiodarone (PACERONE) 200 MG tablet Take 1 tablet (200 mg total) by mouth daily. 12/17/19   Ledora Bottcher, PA  aspirin 81 MG tablet Take 81 mg by mouth daily.      [provider]  atorvastatin (LIPITOR) 80 MG tablet Take 1 tablet (80 mg total) by mouth daily. 12/16/19   [facility], Tami Lin, PA  cimetidine (TAGAMET) 400 MG tablet Take 400 mg by mouth daily as needed (heartburn).     [provider]  gabapentin (NEURONTIN) 100 MG capsule Take 100 mg by mouth 2 (two) times daily.  12/03/14   [provider]  glimepiride (AMARYL) 4 MG tablet Take 4 mg by mouth every morning. 11/04/19   [provider]  glucose blood (ACCU-CHEK AVIVA PLUS) test strip  07/03/17   [provider]  Hyoscyamine Sulfate SL 0.125 MG SUBL Take 0.125 mg by mouth daily as needed (Diarrhea).  05/31/18   [provider]  losartan (COZAAR) 100 MG tablet Take 100 mg by mouth daily.  05/31/18   [provider]  metFORMIN (GLUCOPHAGE) 500 MG tablet Take 1 tablet (500 mg total) by mouth 2 (two) times daily with a meal. 12/16/19   [facility], Tami Lin, PA  Multiple  Vitamins-Minerals (ICAPS AREDS 2 PO) Take 1 Cartridge by mouth in the morning and at bedtime.    [provider]  nitroGLYCERIN (NITROSTAT) 0.4 MG SL tablet Place 1 tablet (0.4 mg total) under the tongue every 5 (five) minutes as needed for chest pain. 06/05/15   [name], Peter M, MD  NONFORMULARY OR COMPOUNDED [place]:  Peripheral Neuropathy Cream - Bupivacaine 1%, doxepin 3%, Gabapentin 6%, Pentoxifylline 3%, Topiramate 1%, apply 1-2 grams to affected area 3-4 times daily. Patient taking differently: Apply 1 application topically once a week. [facility]:  Peripheral Neuropathy Cream - Bupivacaine 1%, doxepin 3%, Gabapentin 6%, Pentoxifylline 3%, Topiramate 1%, apply 1-2 grams to affected area 3-4 times  daily. 02/25/16   Landis Martins, DPM  OVER THE COUNTER MEDICATION Take 2 capsules by mouth daily. Nerverenew Neuropathy Support Formula (B1 & B12 Vitamins)    [provider]  pantoprazole (PROTONIX) 40 MG tablet Take 1 tablet (40 mg total) by mouth daily. Patient taking differently: Take 40 mg by mouth 2 (two) times daily.  09/17/19 01/15/20  Noemi Chapel, MD  ranolazine (RANEXA) 500 MG 12 hr tablet Take 1 tablet (500 mg total) by mouth 2 (two) times daily. 12/16/19   Ledora Bottcher, PA    ALLERGIES:  Allergies  Allergen Reactions  . Ace Inhibitors Other (See Comments)    unknown  . Nitrates, Organic Swelling    Swelling in hands  . Shellfish Allergy Nausea Only    Pt states scallops-not all shellfish-GI symptoms/ nausea -50+ years ago  . Penicillins Rash    SOCIAL HISTORY:  Social History   Tobacco Use  . Smoking status: Former Smoker    Packs/day: 3.00  . Smokeless tobacco: Never Used  Substance Use Topics  . Alcohol use: No    FAMILY HISTORY: Family History  Problem Relation Age of Onset  . Cancer Mother        Leukemia  . Hypertension Mother   . Cancer Father        Colon  . Heart attack Father        Had Several in his 27's  .  Hypertension Father   . Diabetes Father     EXAM: BP (!) 155/59 (BP Location: Right Arm)   Pulse (!) 59   Temp 98.3 F (36.8 C) (Oral)   Resp 20   Ht 5' 9.5" (1.765 m)   Wt 79.4 kg   SpO2 98%   BMI 25.47 kg/m  CONSTITUTIONAL: Alert and oriented and responds appropriately to questions. Well-appearing; well-nourished, elderly, hard of hearing, poor historian HEAD: Normocephalic EYES: Conjunctivae clear, pupils appear equal, EOM appear intact ENT: normal nose; moist mucous membranes NECK: Supple, normal ROM CARD: RRR; S1 and S2 appreciated; no murmurs, no clicks, no rubs, no gallops RESP: Normal chest excursion without splinting or tachypnea; breath sounds clear and equal bilaterally; no wheezes, no rhonchi, no rales, no hypoxia or respiratory distress, speaking full sentences ABD/GI: Normal bowel sounds; non-distended; soft, non-tender, no rebound, no guarding, no peritoneal signs, no hepatosplenomegaly BACK:  The back appears normal EXT: Normal ROM in all joints; no deformity noted, no edema; no cyanosis, no calf tenderness or calf swelling SKIN: Normal color for age and race; warm; no rash on exposed skin NEURO: Moves all extremities equally PSYCH: The patient's mood and manner are appropriate.   MEDICAL DECISION MAKING: Patient here with chest pain.  He describes what sounds like could be noncardiac chest pain versus stable angina.  He does not describe symptoms of unstable angina.  His EKG today does not show any new abnormality.  There is no new ischemia.  He has had 2 normal high-sensitivity troponins.  He had a CTA of his chest yesterday which showed no PE or other acute abnormality in the lungs.  He reports pain has improved with aspirin but is not completely gone.  Will give GI cocktail and reassess.  Will discuss with cardiology on-call.  ED PROGRESS:    3:35 AM  Spoke with Dr. Launa Grill with cardiology.  Appreciate her help.  She agrees that work-up seems to be complete in the  ED and we have ruled out PE, ACS and that he sounds stable for  discharge with close cardiology follow-up.  She does state that it may be reasonable to start the patient on low-dose beta-blocker which could help with symptoms however he was recently taken off of beta-blockers due to bradycardia.  Unfortunately there is not much room to go up on his Ranexa and it does not sound like he would tolerate Imdur as he reports significant symptoms with nitroglycerin.  Cardiologist has offered to see patient in the ED and I have offered this to patient and family as well but they have been here for hours unfortunately and are very eager for discharge home.  They are comfortable with her plan of care and following up with Dr. [name] the beginning of next week.  He states he did call Dr. Doug Sou office prior to coming to the ER.  We discussed options of starting him back on a low-dose beta-blocker but patient is very hesitant to start any new medications given he has had complications recently.  He reports he will discuss this further with his cardiologist.  He reports the residual pain that he had left in the left side of his chest has completely resolved after GI cocktail.  This makes me suspicious that this is more noncardiac chest pain.  I recommended close follow-up with cardiology as well as his primary care provider and we have discussed at length return precautions.  Patient and son verbalized understanding and are comfortable with the plan for discharge.  Currently chest pain-free at this time and hemodynamically stable.   I have sent a message to patient's cardiologist Dr. [name] for outpatient follow-up.  At this time, I do not feel there is any life-threatening condition present. I have reviewed, interpreted and discussed all results (EKG, imaging, lab, urine as appropriate) and exam findings with patient/family. I have reviewed nursing notes and appropriate previous records.  I feel the patient is safe to  be discharged home without further emergent workup and can continue workup as an outpatient as needed. Discussed usual and customary return precautions. Patient/family verbalize understanding and are comfortable with this plan.  Outpatient follow-up has been provided as needed. All questions have been answered.   EKG Interpretation  Date/Time:  Wednesday January 08 2020 17:23:23 EDT Ventricular Rate:  61 PR Interval:    QRS Duration: 98 QT Interval:  422 QTC Calculation: 424 R Axis:   53 Text Interpretation: Sinus bradycardia with 1st degree AV block Abnormal ECG No significant change since last tracing Confirmed by Pryor Curia [phone]) on 01/09/2020 2:51:24 AM         [name] was evaluated in Emergency Department on 01/09/2020 for the symptoms described in the history of present illness. He was evaluated in the context of the global COVID-19 pandemic, which necessitated consideration that the patient might be at risk for infection with the SARS-CoV-2 virus that causes COVID-19. Institutional protocols and algorithms that pertain to the evaluation of patients at risk for COVID-19 are in a state of rapid change based on information released by regulatory bodies including the CDC and federal and state organizations. These policies and algorithms were followed during the patient's care in the ED.      Gabriella Woodhead, Delice Bison, DO 01/09/20 0402    Aryella Besecker, Delice Bison, DO 01/09/20 [id number]

## 2020-01-09 NOTE — ED Notes (Signed)
Patient verbalizes understanding of discharge instructions. Opportunity for questioning and answers were provided. Armband removed by staff, pt discharged from ED ambulatory to home.  

## 2020-01-10 DIAGNOSIS — K219 Gastro-esophageal reflux disease without esophagitis: Secondary | ICD-10-CM | POA: Diagnosis not present

## 2020-01-10 DIAGNOSIS — I25810 Atherosclerosis of coronary artery bypass graft(s) without angina pectoris: Secondary | ICD-10-CM | POA: Diagnosis not present

## 2020-01-10 DIAGNOSIS — R079 Chest pain, unspecified: Secondary | ICD-10-CM | POA: Diagnosis not present

## 2020-01-13 DIAGNOSIS — R339 Retention of urine, unspecified: Secondary | ICD-10-CM | POA: Diagnosis not present

## 2020-01-13 DIAGNOSIS — K4020 Bilateral inguinal hernia, without obstruction or gangrene, not specified as recurrent: Secondary | ICD-10-CM | POA: Diagnosis not present

## 2020-01-13 DIAGNOSIS — I700 Atherosclerosis of aorta: Secondary | ICD-10-CM | POA: Diagnosis not present

## 2020-01-13 DIAGNOSIS — R52 Pain, unspecified: Secondary | ICD-10-CM | POA: Diagnosis not present

## 2020-01-13 DIAGNOSIS — R1084 Generalized abdominal pain: Secondary | ICD-10-CM | POA: Diagnosis not present

## 2020-01-13 DIAGNOSIS — K8689 Other specified diseases of pancreas: Secondary | ICD-10-CM | POA: Diagnosis not present

## 2020-01-13 DIAGNOSIS — K5730 Diverticulosis of large intestine without perforation or abscess without bleeding: Secondary | ICD-10-CM | POA: Diagnosis not present

## 2020-01-13 DIAGNOSIS — R197 Diarrhea, unspecified: Secondary | ICD-10-CM | POA: Diagnosis not present

## 2020-01-14 DIAGNOSIS — Z79899 Other long term (current) drug therapy: Secondary | ICD-10-CM | POA: Diagnosis not present

## 2020-01-14 DIAGNOSIS — N481 Balanitis: Secondary | ICD-10-CM | POA: Diagnosis not present

## 2020-01-14 DIAGNOSIS — R339 Retention of urine, unspecified: Secondary | ICD-10-CM | POA: Diagnosis not present

## 2020-01-14 DIAGNOSIS — C61 Malignant neoplasm of prostate: Secondary | ICD-10-CM | POA: Diagnosis not present

## 2020-01-14 DIAGNOSIS — N390 Urinary tract infection, site not specified: Secondary | ICD-10-CM | POA: Diagnosis not present

## 2020-01-20 DIAGNOSIS — R339 Retention of urine, unspecified: Secondary | ICD-10-CM | POA: Diagnosis not present

## 2020-01-21 DIAGNOSIS — D649 Anemia, unspecified: Secondary | ICD-10-CM | POA: Diagnosis not present

## 2020-01-21 DIAGNOSIS — E785 Hyperlipidemia, unspecified: Secondary | ICD-10-CM | POA: Diagnosis not present

## 2020-01-21 DIAGNOSIS — I10 Essential (primary) hypertension: Secondary | ICD-10-CM | POA: Diagnosis not present

## 2020-01-21 DIAGNOSIS — I208 Other forms of angina pectoris: Secondary | ICD-10-CM | POA: Diagnosis not present

## 2020-01-21 DIAGNOSIS — Z951 Presence of aortocoronary bypass graft: Secondary | ICD-10-CM | POA: Diagnosis not present

## 2020-01-21 DIAGNOSIS — R339 Retention of urine, unspecified: Secondary | ICD-10-CM | POA: Diagnosis not present

## 2020-01-21 DIAGNOSIS — K219 Gastro-esophageal reflux disease without esophagitis: Secondary | ICD-10-CM | POA: Diagnosis not present

## 2020-01-21 DIAGNOSIS — I471 Supraventricular tachycardia: Secondary | ICD-10-CM | POA: Diagnosis not present

## 2020-01-21 DIAGNOSIS — N481 Balanitis: Secondary | ICD-10-CM | POA: Diagnosis not present

## 2020-01-22 ENCOUNTER — Other Ambulatory Visit: Payer: Medicare PPO

## 2020-01-22 DIAGNOSIS — M21962 Unspecified acquired deformity of left lower leg: Secondary | ICD-10-CM | POA: Diagnosis not present

## 2020-01-22 DIAGNOSIS — D649 Anemia, unspecified: Secondary | ICD-10-CM | POA: Diagnosis not present

## 2020-01-22 DIAGNOSIS — I10 Essential (primary) hypertension: Secondary | ICD-10-CM | POA: Diagnosis not present

## 2020-01-22 DIAGNOSIS — E1142 Type 2 diabetes mellitus with diabetic polyneuropathy: Secondary | ICD-10-CM | POA: Diagnosis not present

## 2020-01-22 DIAGNOSIS — I208 Other forms of angina pectoris: Secondary | ICD-10-CM | POA: Diagnosis not present

## 2020-01-22 DIAGNOSIS — K219 Gastro-esophageal reflux disease without esophagitis: Secondary | ICD-10-CM | POA: Diagnosis not present

## 2020-01-22 DIAGNOSIS — Z951 Presence of aortocoronary bypass graft: Secondary | ICD-10-CM | POA: Diagnosis not present

## 2020-01-22 DIAGNOSIS — M21961 Unspecified acquired deformity of right lower leg: Secondary | ICD-10-CM | POA: Diagnosis not present

## 2020-01-22 DIAGNOSIS — E785 Hyperlipidemia, unspecified: Secondary | ICD-10-CM | POA: Diagnosis not present

## 2020-01-22 DIAGNOSIS — L84 Corns and callosities: Secondary | ICD-10-CM | POA: Diagnosis not present

## 2020-01-22 DIAGNOSIS — I471 Supraventricular tachycardia: Secondary | ICD-10-CM | POA: Diagnosis not present

## 2020-01-25 DIAGNOSIS — N3289 Other specified disorders of bladder: Secondary | ICD-10-CM | POA: Diagnosis not present

## 2020-01-25 DIAGNOSIS — R310 Gross hematuria: Secondary | ICD-10-CM | POA: Diagnosis not present

## 2020-01-25 DIAGNOSIS — I700 Atherosclerosis of aorta: Secondary | ICD-10-CM | POA: Diagnosis not present

## 2020-01-25 DIAGNOSIS — D62 Acute posthemorrhagic anemia: Secondary | ICD-10-CM | POA: Diagnosis not present

## 2020-01-25 DIAGNOSIS — K5730 Diverticulosis of large intestine without perforation or abscess without bleeding: Secondary | ICD-10-CM | POA: Diagnosis not present

## 2020-01-25 DIAGNOSIS — N200 Calculus of kidney: Secondary | ICD-10-CM | POA: Diagnosis not present

## 2020-01-25 DIAGNOSIS — R58 Hemorrhage, not elsewhere classified: Secondary | ICD-10-CM | POA: Diagnosis not present

## 2020-01-26 DIAGNOSIS — D62 Acute posthemorrhagic anemia: Secondary | ICD-10-CM | POA: Diagnosis not present

## 2020-01-26 DIAGNOSIS — E119 Type 2 diabetes mellitus without complications: Secondary | ICD-10-CM | POA: Diagnosis not present

## 2020-01-26 DIAGNOSIS — N200 Calculus of kidney: Secondary | ICD-10-CM | POA: Diagnosis not present

## 2020-01-26 DIAGNOSIS — K219 Gastro-esophageal reflux disease without esophagitis: Secondary | ICD-10-CM | POA: Diagnosis not present

## 2020-01-26 DIAGNOSIS — I700 Atherosclerosis of aorta: Secondary | ICD-10-CM | POA: Diagnosis not present

## 2020-01-26 DIAGNOSIS — N3289 Other specified disorders of bladder: Secondary | ICD-10-CM | POA: Diagnosis not present

## 2020-01-26 DIAGNOSIS — N401 Enlarged prostate with lower urinary tract symptoms: Secondary | ICD-10-CM | POA: Diagnosis not present

## 2020-01-26 DIAGNOSIS — R339 Retention of urine, unspecified: Secondary | ICD-10-CM | POA: Diagnosis not present

## 2020-01-26 DIAGNOSIS — I10 Essential (primary) hypertension: Secondary | ICD-10-CM | POA: Diagnosis not present

## 2020-01-26 DIAGNOSIS — R319 Hematuria, unspecified: Secondary | ICD-10-CM | POA: Diagnosis not present

## 2020-01-26 DIAGNOSIS — R310 Gross hematuria: Secondary | ICD-10-CM | POA: Diagnosis not present

## 2020-01-26 DIAGNOSIS — K5730 Diverticulosis of large intestine without perforation or abscess without bleeding: Secondary | ICD-10-CM | POA: Diagnosis not present

## 2020-01-26 DIAGNOSIS — Z951 Presence of aortocoronary bypass graft: Secondary | ICD-10-CM | POA: Diagnosis not present

## 2020-01-26 DIAGNOSIS — I2510 Atherosclerotic heart disease of native coronary artery without angina pectoris: Secondary | ICD-10-CM | POA: Diagnosis not present

## 2020-01-29 DIAGNOSIS — R339 Retention of urine, unspecified: Secondary | ICD-10-CM | POA: Diagnosis not present

## 2020-01-29 DIAGNOSIS — T83091S Other mechanical complication of indwelling urethral catheter, sequela: Secondary | ICD-10-CM | POA: Diagnosis not present

## 2020-01-29 DIAGNOSIS — R310 Gross hematuria: Secondary | ICD-10-CM | POA: Diagnosis not present

## 2020-01-30 DIAGNOSIS — C61 Malignant neoplasm of prostate: Secondary | ICD-10-CM | POA: Diagnosis not present

## 2020-01-30 DIAGNOSIS — R310 Gross hematuria: Secondary | ICD-10-CM | POA: Diagnosis not present

## 2020-01-30 DIAGNOSIS — N3090 Cystitis, unspecified without hematuria: Secondary | ICD-10-CM | POA: Diagnosis not present

## 2020-01-31 DIAGNOSIS — R0789 Other chest pain: Secondary | ICD-10-CM | POA: Diagnosis not present

## 2020-01-31 DIAGNOSIS — J42 Unspecified chronic bronchitis: Secondary | ICD-10-CM | POA: Diagnosis not present

## 2020-01-31 DIAGNOSIS — I2510 Atherosclerotic heart disease of native coronary artery without angina pectoris: Secondary | ICD-10-CM | POA: Diagnosis not present

## 2020-01-31 DIAGNOSIS — R319 Hematuria, unspecified: Secondary | ICD-10-CM | POA: Diagnosis not present

## 2020-01-31 DIAGNOSIS — R079 Chest pain, unspecified: Secondary | ICD-10-CM | POA: Diagnosis not present

## 2020-01-31 DIAGNOSIS — I700 Atherosclerosis of aorta: Secondary | ICD-10-CM | POA: Diagnosis not present

## 2020-01-31 DIAGNOSIS — J432 Centrilobular emphysema: Secondary | ICD-10-CM | POA: Diagnosis not present

## 2020-01-31 DIAGNOSIS — N390 Urinary tract infection, site not specified: Secondary | ICD-10-CM | POA: Diagnosis not present

## 2020-01-31 DIAGNOSIS — R0602 Shortness of breath: Secondary | ICD-10-CM | POA: Diagnosis not present

## 2020-01-31 DIAGNOSIS — I10 Essential (primary) hypertension: Secondary | ICD-10-CM | POA: Diagnosis not present

## 2020-02-03 NOTE — Progress Notes (Deleted)
Cardiology Office Note   Date:  02/03/2020   ID:  Alexander Choi, DOB June 16, 1933, MRN [id number]  PCP:  Nicoletta Dress, MD Cardiologist:  Peter [name], MD 01/07/2020 Electrphysiologist: None Rosaria Ferries, PA-C   No chief complaint on file.   History of Present Illness: Alexander Choi is a 86 y.o. male with a history of CABG x 4 in 2006, cath 2015 w/ patent grafts & distal CFX dz, cath 11/2019 no change, DM, HTN, HLD, GERD, CKD, prostate CA, bowel obs 03/2019 2nd adhesions  Alexander Choi presents for ***   Past Medical History:  Diagnosis Date  . Cancer ([place])   . Chronic kidney disease    kidney stones  . Coronary artery disease    with CABG in 2006 with LIMA to LAD, SVG to mid and distal OM, SVG to AM. Normal Myoview in May of 2013  . Diabetes mellitus    Type 2  . GERD (gastroesophageal reflux disease)   . Hematuria   . Hypercholesterolemia   . Hypertension   . Irritable bowel syndrome   . Loose stools   . Prostate cancer ([place])   . Urinary obstruction     Past Surgical History:  Procedure Laterality Date  . CHOLECYSTECTOMY    . CORONARY ARTERY BYPASS GRAFT  2006  . LEFT HEART CATHETERIZATION WITH CORONARY/GRAFT ANGIOGRAM N/A 04/11/2014   Procedure: LEFT HEART CATHETERIZATION WITH [name];  Surgeon: Peter M [name], MD;  Location[facility] CATH LAB;  Service: Cardiovascular;  Laterality: N/A;  . OTHER SURGICAL HISTORY     bowel obstruction  . RADIOACTIVE SEED IMPLANT     for prostate cancer  . RIGHT/LEFT HEART CATH AND CORONARY/GRAFT ANGIOGRAPHY N/A 12/12/2019   Procedure: RIGHT/LEFT HEART CATH AND CORONARY/GRAFT ANGIOGRAPHY;  Surgeon: [name], Peter M, MD;  Location: [place] CV LAB;  Service: Cardiovascular;  Laterality: N/A;  . SKIN CANCER EXCISION    . TEMPORARY PACEMAKER N/A 12/13/2019   Procedure: TEMPORARY PACEMAKER;  Surgeon: [name], Peter M, MD;  Location: [place] CV LAB;  Service: Cardiovascular;  Laterality: N/A;    Current  Outpatient Medications  Medication Sig Dispense Refill  . Accu-Chek Softclix Lancets lancets     . Accu-Chek Softclix Lancets lancets     . amiodarone (PACERONE) 200 MG tablet Take 1 tablet (200 mg total) by mouth daily. 90 tablet 3  . aspirin 81 MG tablet Take 81 mg by mouth daily.      Marland Kitchen atorvastatin (LIPITOR) 80 MG tablet Take 1 tablet (80 mg total) by mouth daily. 90 tablet 3  . cimetidine (TAGAMET) 400 MG tablet Take 400 mg by mouth daily as needed (heartburn).     . gabapentin (NEURONTIN) 100 MG capsule Take 100 mg by mouth 2 (two) times daily.     Marland Kitchen glimepiride (AMARYL) 4 MG tablet Take 4 mg by mouth every morning.    Marland Kitchen glucose blood (ACCU-CHEK AVIVA PLUS) test strip     . Hyoscyamine Sulfate SL 0.125 MG SUBL Take 0.125 mg by mouth daily as needed (Diarrhea).     . losartan (COZAAR) 100 MG tablet Take 100 mg by mouth daily.     . metFORMIN (GLUCOPHAGE) 500 MG tablet Take 1 tablet (500 mg total) by mouth 2 (two) times daily with a meal.    . Multiple Vitamins-Minerals (ICAPS AREDS 2 PO) Take 1 Cartridge by mouth in the morning and at bedtime.    . nitroGLYCERIN (NITROSTAT) 0.4 MG SL tablet Place  1 tablet (0.4 mg total) under the tongue every 5 (five) minutes as needed for chest pain. 90 tablet 3  . NONFORMULARY OR COMPOUNDED ITEM [facility]:  Peripheral Neuropathy Cream - Bupivacaine 1%, doxepin 3%, Gabapentin 6%, Pentoxifylline 3%, Topiramate 1%, apply 1-2 grams to affected area 3-4 times daily. (Patient taking differently: Apply 1 application topically once a week. [facility]:  Peripheral Neuropathy Cream - Bupivacaine 1%, doxepin 3%, Gabapentin 6%, Pentoxifylline 3%, Topiramate 1%, apply 1-2 grams to affected area 3-4 times daily.) 120 each 3  . OVER THE COUNTER MEDICATION Take 2 capsules by mouth daily. Nerverenew Neuropathy Support Formula (B1 & B12 Vitamins)    . pantoprazole (PROTONIX) 40 MG tablet Take 1 tablet (40 mg total) by mouth daily. (Patient taking differently:  Take 40 mg by mouth 2 (two) times daily. ) 30 tablet 0  . ranolazine (RANEXA) 500 MG 12 hr tablet Take 1 tablet (500 mg total) by mouth 2 (two) times daily. 180 tablet 3   Current Facility-Administered Medications  Medication Dose Route Frequency Provider Last Rate Last Admin  . sodium chloride flush (NS) 0.9 % injection 3 mL  3 mL Intravenous Q12H [name], Peter M, MD        Allergies:   Ace inhibitors; Nitrates, organic; Shellfish allergy; and Penicillins    Social History:  The patient  reports that he has quit smoking. He smoked 3.00 packs per day. He has never used smokeless tobacco. He reports that he does not drink alcohol and does not use drugs.   Family History:  The patient's family history includes Cancer in his father and mother; Diabetes in his father; Heart attack in his father; Hypertension in his father and mother.  He indicated that his mother is deceased. He indicated that his father is deceased. He indicated that the status of his sister is unknown and reported the following: bypass surgery. He indicated that his maternal grandmother is deceased. He indicated that his maternal grandfather is deceased. He indicated that his paternal grandmother is deceased. He indicated that his paternal grandfather is deceased.  ROS:  Please see the history of present illness. All other systems are reviewed and negative.    PHYSICAL EXAM: VS:  There were no vitals taken for this visit. , BMI There is no height or weight on file to calculate BMI. GEN: Well nourished, well developed, male in no acute distress HEENT: normal for age  Neck: no JVD, no carotid bruit, no masses Cardiac: RRR; no murmur, no rubs, or gallops Respiratory:  clear to auscultation bilaterally, normal work of breathing GI: soft, nontender, nondistended, + BS MS: no deformity or atrophy; no edema; distal pulses are 2+ in all 4 extremities  Skin: warm and dry, no rash Neuro:  Strength and sensation are intact Psych:  euthymic mood, full affect   EKG:  EKG {ACTION; IS/IS [id number]} ordered today. The ekg ordered today demonstrates ***  ECHO: ***  CATH:  12/12/2019  Mid LM lesion is 35% stenosed.  Ost LAD to Mid LAD lesion is 100% stenosed.  Prox Cx lesion is 99% stenosed.  Prox Cx to Mid Cx lesion is 90% stenosed.  1st Mrg lesion is 100% stenosed.  2nd Mrg lesion is 75% stenosed.  Prox RCA to Dist RCA lesion is 100% stenosed.  LIMA graft was visualized by angiography and is normal in caliber.  The graft exhibits no disease.  Dist LAD-1 lesion is 60% stenosed.  Dist LAD-2 lesion is 90% stenosed.  SVG graft  was visualized by angiography and is large.  The graft exhibits minimal luminal irregularities.  SVG graft was visualized by angiography and is normal in caliber.  The graft exhibits mild .  The left ventricular systolic function is normal.  LV end diastolic pressure is normal.  The left ventricular ejection fraction is 55-65% by visual estimate.  1. Complex 3 vessel obstructive CAD.  2. Patent LIMA to the LAD. There is severe disease in the distal LAD. The vessel is small in caliber and not suitable for PCI 3. Patent SVG to OM1 4. Patent SVG to RV marginal branch. The RCA is occluded with left to right collaterals from the LCx to the distal RCA 5. Normal LV function 6. Normal LV filling pressures 7. 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 This supplies the second and third OM branches and collaterals to the distal RCA. This is a potential target for intervention. It will require atherectomy. Given progressive symptoms will admit to telemetry. Start IV heparin. Load with Plavix. Planned staged complex PCI of the LCx tomorrow.   Diagnostic Dominance: Right    Attempted PCI 12/13/19:  TEMPORARY PACEMAKER  INVASIVE LAB ABORTED CASE   Conclusion    Dist LAD-1 lesion is 60% stenosed.  Prox Cx lesion is 99% stenosed.  Prox Cx to Mid Cx lesion is 90% stenosed.  2nd Mrg lesion is 75% stenosed.  Unsuccessful PCI due to inability to cross with a wire.  1. Unsuccessful PCI of the LCx due to inability to cross the lesion with a wire.   Plan: medical therapy. ASA only. Will increase amlodipine to 5 mg daily. Consider Ranexa if symptoms persists. Anticipate DC tomorrow am.   MONITOR: 01/15/2020  Normal sinus rhythm  Occasional PACs.  Occasional brief runs of PAT longest lasting 18.5 seconds.    Recent Labs: 12/15/2019: TSH 6.725 12/16/2019: ALT 14; Magnesium 2.0 01/08/2020: BUN 16; Creatinine, Ser 0.71; Hemoglobin 12.1; Platelets 230; Potassium 4.2; Sodium 135  CBC    Component Value Date/Time   WBC 7.3 01/08/2020 1740   RBC 3.95 (L) 01/08/2020 1740   HGB 12.1 (L) 01/08/2020 1740   HCT 36.6 (L) 01/08/2020 1740   PLT 230 01/08/2020 1740   MCV 92.7 01/08/2020 1740   MCH 30.6 01/08/2020 1740   MCHC 33.1 01/08/2020 1740   RDW 12.8 01/08/2020 1740   LYMPHSABS 2.0 04/08/2014 0930   MONOABS 0.4 04/08/2014 0930   EOSABS 0.3 04/08/2014 0930   BASOSABS 0.1 04/08/2014 0930   CMP Latest Ref Rng & Units 01/08/2020 12/16/2019 12/13/2019  Glucose 70 - 99 mg/dL 104(H) 134(H) 128(H)  BUN 8 - 23 mg/dL 16 17 11   Creatinine 0.61 - 1.24 mg/dL 0.71 0.85 0.83  Sodium 135 - 145 mmol/L 135 138 137  Potassium 3.5 - 5.1 mmol/L 4.2 3.9 3.6  Chloride 98 - 111 mmol/L 97(L) 102 102  CO2 22 - 32 mmol/L 26 28 29   Calcium 8.9 - 10.3 mg/dL 9.5 9.1 8.6(L)  Total Protein 6.5 - 8.1 g/dL - 5.5(L) -  Total Bilirubin 0.3 - 1.2 mg/dL - 1.0 -  Alkaline Phos 38 - 126 U/L - 62 -  AST 15 - 41 U/L - 20 -  ALT 0 - 44 U/L - 14 -     Lipid Panel Lab Results  Component Value Date   CHOL 120 12/16/2019   HDL 35 (L) 12/16/2019   LDLCALC 60  12/16/2019   TRIG 125 12/16/2019   CHOLHDL 3.4 12/16/2019      Wt Readings from Last 3  Encounters:  01/08/20 175 lb (79.4 kg)  01/07/20 176 lb (79.8 kg)  12/16/19 176 lb 10.5 oz (80.1 kg)     Other studies Reviewed: Additional studies/ records that were reviewed today include: Office notes, hospital records and testing.  ASSESSMENT AND PLAN:  1.  ***   Current medicines are reviewed at length with the patient today.  The patient {ACTIONS; HAS/DOES NOT HAVE:19233} concerns regarding medicines.  The following changes have been made:  {PLAN; NO CHANGE:13088:s}  Labs/ tests ordered today include:  No orders of the defined types were placed in this encounter.    Disposition:   FU with Peter [name], MD  Signed, Rosaria Ferries, PA-C  02/03/2020 2:34 PM    [place] Phone: [phone]; Fax: [phone]

## 2020-02-04 DIAGNOSIS — R110 Nausea: Secondary | ICD-10-CM | POA: Diagnosis not present

## 2020-02-04 DIAGNOSIS — R6889 Other general symptoms and signs: Secondary | ICD-10-CM | POA: Diagnosis not present

## 2020-02-05 DIAGNOSIS — I2510 Atherosclerotic heart disease of native coronary artery without angina pectoris: Secondary | ICD-10-CM | POA: Diagnosis not present

## 2020-02-05 DIAGNOSIS — E871 Hypo-osmolality and hyponatremia: Secondary | ICD-10-CM | POA: Diagnosis not present

## 2020-02-05 DIAGNOSIS — I10 Essential (primary) hypertension: Secondary | ICD-10-CM | POA: Diagnosis not present

## 2020-02-05 DIAGNOSIS — N139 Obstructive and reflux uropathy, unspecified: Secondary | ICD-10-CM | POA: Diagnosis not present

## 2020-02-05 DIAGNOSIS — E119 Type 2 diabetes mellitus without complications: Secondary | ICD-10-CM | POA: Diagnosis not present

## 2020-02-05 DIAGNOSIS — K219 Gastro-esophageal reflux disease without esophagitis: Secondary | ICD-10-CM | POA: Diagnosis not present

## 2020-02-05 DIAGNOSIS — T83511A Infection and inflammatory reaction due to indwelling urethral catheter, initial encounter: Secondary | ICD-10-CM | POA: Diagnosis not present

## 2020-02-05 DIAGNOSIS — E785 Hyperlipidemia, unspecified: Secondary | ICD-10-CM | POA: Diagnosis not present

## 2020-02-05 DIAGNOSIS — I25810 Atherosclerosis of coronary artery bypass graft(s) without angina pectoris: Secondary | ICD-10-CM | POA: Diagnosis not present

## 2020-02-05 DIAGNOSIS — I739 Peripheral vascular disease, unspecified: Secondary | ICD-10-CM | POA: Diagnosis not present

## 2020-02-05 DIAGNOSIS — G609 Hereditary and idiopathic neuropathy, unspecified: Secondary | ICD-10-CM | POA: Diagnosis not present

## 2020-02-05 DIAGNOSIS — R531 Weakness: Secondary | ICD-10-CM | POA: Diagnosis not present

## 2020-02-05 DIAGNOSIS — E878 Other disorders of electrolyte and fluid balance, not elsewhere classified: Secondary | ICD-10-CM | POA: Diagnosis not present

## 2020-02-05 DIAGNOSIS — K5900 Constipation, unspecified: Secondary | ICD-10-CM | POA: Diagnosis not present

## 2020-02-05 DIAGNOSIS — D649 Anemia, unspecified: Secondary | ICD-10-CM | POA: Diagnosis not present

## 2020-02-05 DIAGNOSIS — R0902 Hypoxemia: Secondary | ICD-10-CM | POA: Diagnosis not present

## 2020-02-05 DIAGNOSIS — I959 Hypotension, unspecified: Secondary | ICD-10-CM | POA: Diagnosis not present

## 2020-02-05 DIAGNOSIS — I471 Supraventricular tachycardia: Secondary | ICD-10-CM | POA: Diagnosis not present

## 2020-02-05 DIAGNOSIS — R778 Other specified abnormalities of plasma proteins: Secondary | ICD-10-CM | POA: Diagnosis not present

## 2020-02-05 DIAGNOSIS — R1013 Epigastric pain: Secondary | ICD-10-CM | POA: Diagnosis not present

## 2020-02-05 DIAGNOSIS — R339 Retention of urine, unspecified: Secondary | ICD-10-CM | POA: Diagnosis not present

## 2020-02-05 DIAGNOSIS — K5790 Diverticulosis of intestine, part unspecified, without perforation or abscess without bleeding: Secondary | ICD-10-CM | POA: Diagnosis not present

## 2020-02-05 DIAGNOSIS — B965 Pseudomonas (aeruginosa) (mallei) (pseudomallei) as the cause of diseases classified elsewhere: Secondary | ICD-10-CM | POA: Diagnosis not present

## 2020-02-05 DIAGNOSIS — W19XXXA Unspecified fall, initial encounter: Secondary | ICD-10-CM | POA: Diagnosis not present

## 2020-02-05 DIAGNOSIS — R2689 Other abnormalities of gait and mobility: Secondary | ICD-10-CM | POA: Diagnosis not present

## 2020-02-05 DIAGNOSIS — M6281 Muscle weakness (generalized): Secondary | ICD-10-CM | POA: Diagnosis not present

## 2020-02-05 DIAGNOSIS — I4892 Unspecified atrial flutter: Secondary | ICD-10-CM | POA: Diagnosis not present

## 2020-02-05 DIAGNOSIS — Z7401 Bed confinement status: Secondary | ICD-10-CM | POA: Diagnosis not present

## 2020-02-06 ENCOUNTER — Ambulatory Visit: Payer: Medicare PPO | Admitting: Physician Assistant

## 2020-02-06 DIAGNOSIS — I25810 Atherosclerosis of coronary artery bypass graft(s) without angina pectoris: Secondary | ICD-10-CM

## 2020-02-06 DIAGNOSIS — W19XXXA Unspecified fall, initial encounter: Secondary | ICD-10-CM | POA: Diagnosis not present

## 2020-02-06 DIAGNOSIS — R531 Weakness: Secondary | ICD-10-CM

## 2020-02-06 DIAGNOSIS — D649 Anemia, unspecified: Secondary | ICD-10-CM

## 2020-02-06 DIAGNOSIS — R778 Other specified abnormalities of plasma proteins: Secondary | ICD-10-CM

## 2020-02-07 DIAGNOSIS — D649 Anemia, unspecified: Secondary | ICD-10-CM | POA: Diagnosis not present

## 2020-02-07 DIAGNOSIS — E785 Hyperlipidemia, unspecified: Secondary | ICD-10-CM

## 2020-02-07 DIAGNOSIS — W19XXXA Unspecified fall, initial encounter: Secondary | ICD-10-CM | POA: Diagnosis not present

## 2020-02-07 DIAGNOSIS — R778 Other specified abnormalities of plasma proteins: Secondary | ICD-10-CM | POA: Diagnosis not present

## 2020-02-07 DIAGNOSIS — R531 Weakness: Secondary | ICD-10-CM | POA: Diagnosis not present

## 2020-02-07 DIAGNOSIS — I471 Supraventricular tachycardia: Secondary | ICD-10-CM

## 2020-02-08 DIAGNOSIS — I10 Essential (primary) hypertension: Secondary | ICD-10-CM | POA: Diagnosis not present

## 2020-02-08 DIAGNOSIS — I2510 Atherosclerotic heart disease of native coronary artery without angina pectoris: Secondary | ICD-10-CM

## 2020-02-08 DIAGNOSIS — R531 Weakness: Secondary | ICD-10-CM

## 2020-02-12 DIAGNOSIS — I25810 Atherosclerosis of coronary artery bypass graft(s) without angina pectoris: Secondary | ICD-10-CM | POA: Diagnosis not present

## 2020-02-12 DIAGNOSIS — D649 Anemia, unspecified: Secondary | ICD-10-CM | POA: Diagnosis not present

## 2020-02-12 DIAGNOSIS — I4892 Unspecified atrial flutter: Secondary | ICD-10-CM | POA: Diagnosis not present

## 2020-02-12 DIAGNOSIS — I959 Hypotension, unspecified: Secondary | ICD-10-CM | POA: Diagnosis not present

## 2020-02-12 DIAGNOSIS — R339 Retention of urine, unspecified: Secondary | ICD-10-CM | POA: Diagnosis not present

## 2020-02-12 DIAGNOSIS — R2689 Other abnormalities of gait and mobility: Secondary | ICD-10-CM | POA: Diagnosis not present

## 2020-02-12 DIAGNOSIS — N139 Obstructive and reflux uropathy, unspecified: Secondary | ICD-10-CM | POA: Diagnosis not present

## 2020-02-12 DIAGNOSIS — M6281 Muscle weakness (generalized): Secondary | ICD-10-CM | POA: Diagnosis not present

## 2020-02-12 DIAGNOSIS — K219 Gastro-esophageal reflux disease without esophagitis: Secondary | ICD-10-CM | POA: Diagnosis not present

## 2020-02-12 DIAGNOSIS — Z79899 Other long term (current) drug therapy: Secondary | ICD-10-CM | POA: Diagnosis not present

## 2020-02-12 DIAGNOSIS — Z7401 Bed confinement status: Secondary | ICD-10-CM | POA: Diagnosis not present

## 2020-02-12 DIAGNOSIS — B965 Pseudomonas (aeruginosa) (mallei) (pseudomallei) as the cause of diseases classified elsewhere: Secondary | ICD-10-CM | POA: Diagnosis not present

## 2020-02-12 DIAGNOSIS — T83511A Infection and inflammatory reaction due to indwelling urethral catheter, initial encounter: Secondary | ICD-10-CM | POA: Diagnosis not present

## 2020-02-12 DIAGNOSIS — N390 Urinary tract infection, site not specified: Secondary | ICD-10-CM | POA: Diagnosis not present

## 2020-02-12 DIAGNOSIS — E871 Hypo-osmolality and hyponatremia: Secondary | ICD-10-CM | POA: Diagnosis not present

## 2020-02-12 DIAGNOSIS — G609 Hereditary and idiopathic neuropathy, unspecified: Secondary | ICD-10-CM | POA: Diagnosis not present

## 2020-02-12 DIAGNOSIS — R262 Difficulty in walking, not elsewhere classified: Secondary | ICD-10-CM | POA: Diagnosis not present

## 2020-02-12 DIAGNOSIS — I739 Peripheral vascular disease, unspecified: Secondary | ICD-10-CM | POA: Diagnosis not present

## 2020-02-18 DIAGNOSIS — N390 Urinary tract infection, site not specified: Secondary | ICD-10-CM | POA: Diagnosis not present

## 2020-02-18 DIAGNOSIS — D649 Anemia, unspecified: Secondary | ICD-10-CM | POA: Diagnosis not present

## 2020-02-18 DIAGNOSIS — R339 Retention of urine, unspecified: Secondary | ICD-10-CM | POA: Diagnosis not present

## 2020-02-18 DIAGNOSIS — R262 Difficulty in walking, not elsewhere classified: Secondary | ICD-10-CM | POA: Diagnosis not present

## 2020-02-27 ENCOUNTER — Other Ambulatory Visit: Payer: Self-pay

## 2020-02-27 DIAGNOSIS — H3530 Unspecified macular degeneration: Secondary | ICD-10-CM | POA: Diagnosis not present

## 2020-02-27 DIAGNOSIS — D649 Anemia, unspecified: Secondary | ICD-10-CM | POA: Diagnosis not present

## 2020-02-27 DIAGNOSIS — E785 Hyperlipidemia, unspecified: Secondary | ICD-10-CM | POA: Diagnosis not present

## 2020-02-27 DIAGNOSIS — I10 Essential (primary) hypertension: Secondary | ICD-10-CM | POA: Diagnosis not present

## 2020-02-27 DIAGNOSIS — Z466 Encounter for fitting and adjustment of urinary device: Secondary | ICD-10-CM | POA: Diagnosis not present

## 2020-02-27 DIAGNOSIS — I25810 Atherosclerosis of coronary artery bypass graft(s) without angina pectoris: Secondary | ICD-10-CM | POA: Diagnosis not present

## 2020-02-27 DIAGNOSIS — E119 Type 2 diabetes mellitus without complications: Secondary | ICD-10-CM | POA: Diagnosis not present

## 2020-02-27 DIAGNOSIS — N390 Urinary tract infection, site not specified: Secondary | ICD-10-CM | POA: Diagnosis not present

## 2020-02-27 DIAGNOSIS — N401 Enlarged prostate with lower urinary tract symptoms: Secondary | ICD-10-CM | POA: Diagnosis not present

## 2020-02-27 NOTE — Patient Outreach (Signed)
[facility]) Care Management  02/27/2020  Alexander Choi 05/16/1933 [id number]    EMMI-General Discharge RED ON EMMI ALERT Day #1 Date: 9/0/2021 Red Alert Reason: "Know who to call about changes in condition? No"   Outreach attempt # 1 to patient. Spoke with patient who voices he is doing well since returning hoe. Reviewed and addressed red alert with patient. He reports that he has MD contact info and knows how to call if needed He goes for MD appt on tomorrow. He has supportive sons that check on him daily and will be taking him to appt. Patient confirms he has all his meds in the home and no issues or concerns regarding them. He will be discussing with PCP all the new meds he was started on and the need to continue /discontinue any meds. Advised patient that they would get one more automated EMMI-GENERAL post discharge calls to assess how they are doing following recent hospitalization and will receive a call from a nurse if any of their responses were abnormal. Patient voiced understanding and was appreciative of f/u call.    Plan: RN CM will close case at this time.   Enzo Montgomery, RN,BSN,CCM [facility] Telephonic Care Management Coordinator Direct Phone: [phone] Toll Free: [phone] Fax: [phone]

## 2020-02-28 DIAGNOSIS — E1140 Type 2 diabetes mellitus with diabetic neuropathy, unspecified: Secondary | ICD-10-CM | POA: Diagnosis not present

## 2020-02-28 DIAGNOSIS — B965 Pseudomonas (aeruginosa) (mallei) (pseudomallei) as the cause of diseases classified elsewhere: Secondary | ICD-10-CM | POA: Diagnosis not present

## 2020-02-28 DIAGNOSIS — R339 Retention of urine, unspecified: Secondary | ICD-10-CM | POA: Diagnosis not present

## 2020-02-28 DIAGNOSIS — E1165 Type 2 diabetes mellitus with hyperglycemia: Secondary | ICD-10-CM | POA: Diagnosis not present

## 2020-02-28 DIAGNOSIS — N390 Urinary tract infection, site not specified: Secondary | ICD-10-CM | POA: Diagnosis not present

## 2020-02-28 DIAGNOSIS — R531 Weakness: Secondary | ICD-10-CM | POA: Diagnosis not present

## 2020-02-28 DIAGNOSIS — R778 Other specified abnormalities of plasma proteins: Secondary | ICD-10-CM | POA: Diagnosis not present

## 2020-02-29 DIAGNOSIS — D649 Anemia, unspecified: Secondary | ICD-10-CM | POA: Diagnosis not present

## 2020-02-29 DIAGNOSIS — Z466 Encounter for fitting and adjustment of urinary device: Secondary | ICD-10-CM | POA: Diagnosis not present

## 2020-02-29 DIAGNOSIS — E119 Type 2 diabetes mellitus without complications: Secondary | ICD-10-CM | POA: Diagnosis not present

## 2020-02-29 DIAGNOSIS — N401 Enlarged prostate with lower urinary tract symptoms: Secondary | ICD-10-CM | POA: Diagnosis not present

## 2020-02-29 DIAGNOSIS — E785 Hyperlipidemia, unspecified: Secondary | ICD-10-CM | POA: Diagnosis not present

## 2020-02-29 DIAGNOSIS — N390 Urinary tract infection, site not specified: Secondary | ICD-10-CM | POA: Diagnosis not present

## 2020-02-29 DIAGNOSIS — I25810 Atherosclerosis of coronary artery bypass graft(s) without angina pectoris: Secondary | ICD-10-CM | POA: Diagnosis not present

## 2020-02-29 DIAGNOSIS — H3530 Unspecified macular degeneration: Secondary | ICD-10-CM | POA: Diagnosis not present

## 2020-02-29 DIAGNOSIS — I10 Essential (primary) hypertension: Secondary | ICD-10-CM | POA: Diagnosis not present

## 2020-03-03 DIAGNOSIS — E119 Type 2 diabetes mellitus without complications: Secondary | ICD-10-CM | POA: Diagnosis not present

## 2020-03-03 DIAGNOSIS — E785 Hyperlipidemia, unspecified: Secondary | ICD-10-CM | POA: Diagnosis not present

## 2020-03-03 DIAGNOSIS — N390 Urinary tract infection, site not specified: Secondary | ICD-10-CM | POA: Diagnosis not present

## 2020-03-03 DIAGNOSIS — I25810 Atherosclerosis of coronary artery bypass graft(s) without angina pectoris: Secondary | ICD-10-CM | POA: Diagnosis not present

## 2020-03-03 DIAGNOSIS — Z466 Encounter for fitting and adjustment of urinary device: Secondary | ICD-10-CM | POA: Diagnosis not present

## 2020-03-03 DIAGNOSIS — N401 Enlarged prostate with lower urinary tract symptoms: Secondary | ICD-10-CM | POA: Diagnosis not present

## 2020-03-03 DIAGNOSIS — D649 Anemia, unspecified: Secondary | ICD-10-CM | POA: Diagnosis not present

## 2020-03-03 DIAGNOSIS — H3530 Unspecified macular degeneration: Secondary | ICD-10-CM | POA: Diagnosis not present

## 2020-03-03 DIAGNOSIS — I10 Essential (primary) hypertension: Secondary | ICD-10-CM | POA: Diagnosis not present

## 2020-03-04 DIAGNOSIS — E785 Hyperlipidemia, unspecified: Secondary | ICD-10-CM | POA: Diagnosis not present

## 2020-03-04 DIAGNOSIS — I25810 Atherosclerosis of coronary artery bypass graft(s) without angina pectoris: Secondary | ICD-10-CM | POA: Diagnosis not present

## 2020-03-04 DIAGNOSIS — D649 Anemia, unspecified: Secondary | ICD-10-CM | POA: Diagnosis not present

## 2020-03-04 DIAGNOSIS — N401 Enlarged prostate with lower urinary tract symptoms: Secondary | ICD-10-CM | POA: Diagnosis not present

## 2020-03-04 DIAGNOSIS — I10 Essential (primary) hypertension: Secondary | ICD-10-CM | POA: Diagnosis not present

## 2020-03-04 DIAGNOSIS — Z466 Encounter for fitting and adjustment of urinary device: Secondary | ICD-10-CM | POA: Diagnosis not present

## 2020-03-04 DIAGNOSIS — H3530 Unspecified macular degeneration: Secondary | ICD-10-CM | POA: Diagnosis not present

## 2020-03-04 DIAGNOSIS — E119 Type 2 diabetes mellitus without complications: Secondary | ICD-10-CM | POA: Diagnosis not present

## 2020-03-04 DIAGNOSIS — N390 Urinary tract infection, site not specified: Secondary | ICD-10-CM | POA: Diagnosis not present

## 2020-03-05 NOTE — Progress Notes (Signed)
Cardiology Office Note   Date:  03/05/2020   ID:  Alexander Choi, DOB 13-Oct-1932, MRN [id number]  PCP:  Nicoletta Dress, MD Cardiologist:  Peter [name], MD 01/07/2020 Electrphysiologist: None   ER follow-up  History of Present Illness: Alexander Choi is a 86 y.o. male with a history of CABG in 2006, negative Myoview in October 2015 showed inferior ischemia and cath at that time showd patent grafts, DM, HTN, HLD, CKD III, GERD, bowel obst 2020, and recent cath 11/2019 w/ patent grafts and CFX 100% (u/a to do PCI) who presents for follow-up.   07/13 office visit he was seen for a recent EMS call for acute SOB and hypoglycemia. Amlodipine was discontinued due to low BP. BP was stable at visit. On Ranexa, amaryl reduced. For sob CT chest was ordered showing no PE and otherside unremarkable.   The patient was seen in the ER 01/09/20 for chest pain. He reported it was left-sided and pressure like in nature. No radiation. Also had more belching. Sob for the last 2 months. CP not worse on exertion. Could be worse after eating. No LLe, orthopnea, N/V fever. Aspirin given at home and pain improved. BP there was 155/59. Patient ruled otu for ACS/PE. Spoke with overnight cards fellow who offered to see patient however this was declined since they had been waiting a long time. Patient was reluctant to try and re-start BB or amlodipine. CP resolved after GI cocktail. He was discharged chest pain free.   [name] presents for follow-up. He said since the last ER visit he had a UTI requiring hospitalization and was subsequently discharged to a SNF for month of august where he was doing PT. He was recently discharged back home and feels he is getting stronger each day. He uses a cane occasionally around the house. He lives by himself and his 2 sons live down the street who come by every day to help check on him and help with house chores. He has no exertional CP but does have very brief left sided  chest discomfort not limiting functionality. He has sob but only with the mask on. No orthopnea. He has mild edema on exam R>L and recommended compression socks. BP good today. He still has a catheter in and an apt to get it taken out on the 20th. He plans to follow-up with GI for GERD.    Past Medical History:  Diagnosis Date  . Cancer ([place])   . Chronic kidney disease    kidney stones  . Coronary artery disease    with CABG in 2006 with LIMA to LAD, SVG to mid and distal OM, SVG to AM. Normal Myoview in May of 2013  . Diabetes mellitus    Type 2  . GERD (gastroesophageal reflux disease)   . Hematuria   . Hypercholesterolemia   . Hypertension   . Irritable bowel syndrome   . Loose stools   . Prostate cancer ([place])   . Urinary obstruction     Past Surgical History:  Procedure Laterality Date  . CHOLECYSTECTOMY    . CORONARY ARTERY BYPASS GRAFT  2006  . LEFT HEART CATHETERIZATION WITH CORONARY/GRAFT ANGIOGRAM N/A 04/11/2014   Procedure: LEFT HEART CATHETERIZATION WITH [name];  Surgeon: Peter M [name], MD;  Location: [facility] CATH LAB;  Service: Cardiovascular;  Laterality: N/A;  . OTHER SURGICAL HISTORY     bowel obstruction  . RADIOACTIVE SEED IMPLANT     for prostate cancer  .  RIGHT/LEFT HEART CATH AND CORONARY/GRAFT ANGIOGRAPHY N/A 12/12/2019   Procedure: RIGHT/LEFT HEART CATH AND CORONARY/GRAFT ANGIOGRAPHY;  Surgeon: [name], Peter M, MD;  Location: [facility] CV LAB;  Service: Cardiovascular;  Laterality: N/A;  . SKIN CANCER EXCISION    . TEMPORARY PACEMAKER N/A 12/13/2019   Procedure: TEMPORARY PACEMAKER;  Surgeon: [name], Peter M, MD;  Location: [facility] CV LAB;  Service: Cardiovascular;  Laterality: N/A;    Current Outpatient Medications  Medication Sig Dispense Refill  . Accu-Chek Softclix Lancets lancets     . Accu-Chek Softclix Lancets lancets     . amiodarone (PACERONE) 200 MG tablet Take 1 tablet (200 mg total) by mouth daily. 90 tablet 3  . aspirin  81 MG tablet Take 81 mg by mouth daily.      Marland Kitchen atorvastatin (LIPITOR) 80 MG tablet Take 1 tablet (80 mg total) by mouth daily. 90 tablet 3  . cimetidine (TAGAMET) 400 MG tablet Take 400 mg by mouth daily as needed (heartburn).     . gabapentin (NEURONTIN) 100 MG capsule Take 100 mg by mouth 2 (two) times daily.     Marland Kitchen glimepiride (AMARYL) 4 MG tablet Take 4 mg by mouth every morning.    Marland Kitchen glucose blood (ACCU-CHEK AVIVA PLUS) test strip     . Hyoscyamine Sulfate SL 0.125 MG SUBL Take 0.125 mg by mouth daily as needed (Diarrhea).     . losartan (COZAAR) 100 MG tablet Take 100 mg by mouth daily.     . metFORMIN (GLUCOPHAGE) 500 MG tablet Take 1 tablet (500 mg total) by mouth 2 (two) times daily with a meal.    . Multiple Vitamins-Minerals (ICAPS AREDS 2 PO) Take 1 Cartridge by mouth in the morning and at bedtime.    . nitroGLYCERIN (NITROSTAT) 0.4 MG SL tablet Place 1 tablet (0.4 mg total) under the tongue every 5 (five) minutes as needed for chest pain. 90 tablet 3  . NONFORMULARY OR COMPOUNDED ITEM [facility]:  Peripheral Neuropathy Cream - Bupivacaine 1%, doxepin 3%, Gabapentin 6%, Pentoxifylline 3%, Topiramate 1%, apply 1-2 grams to affected area 3-4 times daily. (Patient taking differently: Apply 1 application topically once a week. [facility]:  Peripheral Neuropathy Cream - Bupivacaine 1%, doxepin 3%, Gabapentin 6%, Pentoxifylline 3%, Topiramate 1%, apply 1-2 grams to affected area 3-4 times daily.) 120 each 3  . OVER THE COUNTER MEDICATION Take 2 capsules by mouth daily. [employer] Neuropathy Support Formula (B1 & B12 Vitamins)    . pantoprazole (PROTONIX) 40 MG tablet Take 1 tablet (40 mg total) by mouth daily. (Patient taking differently: Take 40 mg by mouth 2 (two) times daily. ) 30 tablet 0  . ranolazine (RANEXA) 500 MG 12 hr tablet Take 1 tablet (500 mg total) by mouth 2 (two) times daily. 180 tablet 3   Current Facility-Administered Medications  Medication Dose Route Frequency  Provider Last Rate Last Admin  . sodium chloride flush (NS) 0.9 % injection 3 mL  3 mL Intravenous Q12H [name], Peter M, MD        Allergies:   Ace inhibitors; Nitrates, organic; Shellfish allergy; and Penicillins    Social History:  The patient  reports that he has quit smoking. He smoked 3.00 packs per day. He has never used smokeless tobacco. He reports that he does not drink alcohol and does not use drugs.   Family History:  The patient's family history includes Cancer in his father and mother; Diabetes in his father; Heart attack in his father; Hypertension  in his father and mother.  He indicated that his mother is deceased. He indicated that his father is deceased. He indicated that the status of his sister is unknown and reported the following: bypass surgery. He indicated that his maternal grandmother is deceased. He indicated that his maternal grandfather is deceased. He indicated that his paternal grandmother is deceased. He indicated that his paternal grandfather is deceased.    ROS:  Please see the history of present illness. All other systems are reviewed and negative.    PHYSICAL EXAM: VS:  There were no vitals taken for this visit. , BMI There is no height or weight on file to calculate BMI. GEN: Well nourished, well developed, male in no acute distress HEENT: normal for age  Neck: no JVD, no carotid bruit, no masses Cardiac: RRR; + murmur, no rubs, or gallops Respiratory:  clear to auscultation bilaterally, normal work of breathing GI: soft, nontender, nondistended, + BS MS: no deformity or atrophy; mild edema R>L; distal pulses are 2+ in all 4 extremities  Skin: warm and dry, no rash Neuro:  Strength and sensation are intact Psych: euthymic mood, full affect   EKG:  EKG is ordered today. The ekg ordered today demonstrates NSR, 63 bpm, 1st degree AV block, nonspecific ST changes  ECHO: N/A  CATH: 12/12/19:  RIGHT/LEFT HEART CATH AND CORONARY/GRAFT ANGIOGRAPHY   Conclusion    Mid LM lesion is 35% stenosed.  Ost LAD to Mid LAD lesion is 100% stenosed.  Prox Cx lesion is 99% stenosed.  Prox Cx to Mid Cx lesion is 90% stenosed.  1st Mrg lesion is 100% stenosed.  2nd Mrg lesion is 75% stenosed.  Prox RCA to Dist RCA lesion is 100% stenosed.  LIMA graft was visualized by angiography and is normal in caliber.  The graft exhibits no disease.  Dist LAD-1 lesion is 60% stenosed.  Dist LAD-2 lesion is 90% stenosed.  SVG graft was visualized by angiography and is large.  The graft exhibits minimal luminal irregularities.  SVG graft was visualized by angiography and is normal in caliber.  The graft exhibits mild .  The left ventricular systolic function is normal.  LV end diastolic pressure is normal.  The left ventricular ejection fraction is 55-65% by visual estimate.  1. Complex 3 vessel obstructive CAD.  2. Patent LIMA to the LAD. There is severe disease in the distal LAD. The vessel is small in caliber and not suitable for PCI 3. Patent SVG to OM1 4. Patent SVG to RV marginal branch. The RCA is occluded with left to right collaterals from the LCx to the distal RCA 5. Normal LV function 6. Normal LV filling pressures 7. 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 This supplies the second and third OM branches and collaterals to the distal RCA. This is a potential target for intervention. It will require atherectomy. Given progressive symptoms will admit to telemetry. Start IV heparin. Load with Plavix. Planned staged complex PCI of the LCx tomorrow.   Attempted PCI 12/13/19:  TEMPORARY PACEMAKER  INVASIVE LAB ABORTED CASE  Conclusion    Dist LAD-1 lesion is 60% stenosed.  Prox Cx lesion is 99% stenosed.  Prox Cx to Mid Cx lesion is 90% stenosed.  2nd Mrg lesion is 75% stenosed.  Unsuccessful PCI due  to inability to cross with a wire.  1. Unsuccessful PCI of the LCx due to inability  to cross the lesion with a wire.   Plan: medical therapy. ASA only. Will increase amlodipine to 5 mg daily. Consider Ranexa if symptoms persists.   MONITOR: Blood pressure   Recent Labs: 12/15/2019: TSH 6.725 12/16/2019: ALT 14; Magnesium 2.0 01/08/2020: BUN 16; Creatinine, Ser 0.71; Hemoglobin 12.1; Platelets 230; Potassium 4.2; Sodium 135  CBC    Component Value Date/Time   WBC 7.3 01/08/2020 1740   RBC 3.95 (L) 01/08/2020 1740   HGB 12.1 (L) 01/08/2020 1740   HCT 36.6 (L) 01/08/2020 1740   PLT 230 01/08/2020 1740   MCV 92.7 01/08/2020 1740   MCH 30.6 01/08/2020 1740   MCHC 33.1 01/08/2020 1740   RDW 12.8 01/08/2020 1740   LYMPHSABS 2.0 04/08/2014 0930   MONOABS 0.4 04/08/2014 0930   EOSABS 0.3 04/08/2014 0930   BASOSABS 0.1 04/08/2014 0930   CMP Latest Ref Rng & Units 01/08/2020 12/16/2019 12/13/2019  Glucose 70 - 99 mg/dL 104(H) 134(H) 128(H)  BUN 8 - 23 mg/dL 16 17 11   Creatinine 0.61 - 1.24 mg/dL 0.71 0.85 0.83  Sodium 135 - 145 mmol/L 135 138 137  Potassium 3.5 - 5.1 mmol/L 4.2 3.9 3.6  Chloride 98 - 111 mmol/L 97(L) 102 102  CO2 22 - 32 mmol/L 26 28 29   Calcium 8.9 - 10.3 mg/dL 9.5 9.1 8.6(L)  Total Protein 6.5 - 8.1 g/dL - 5.5(L) -  Total Bilirubin 0.3 - 1.2 mg/dL - 1.0 -  Alkaline Phos 38 - 126 U/L - 62 -  AST 15 - 41 U/L - 20 -  ALT 0 - 44 U/L - 14 -     Lipid Panel Lab Results  Component Value Date   CHOL 120 12/16/2019   HDL 35 (L) 12/16/2019   LDLCALC 60 12/16/2019   TRIG 125 12/16/2019   CHOLHDL 3.4 12/16/2019      Wt Readings from Last 3 Encounters:  01/08/20 175 lb (79.4 kg)  01/07/20 176 lb (79.8 kg)  12/16/19 176 lb 10.5 oz (80.1 kg)     Other studies Reviewed: Additional studies/ records that were reviewed today include: Office notes, hospital records and testing.  ASSESSMENT AND PLAN:  CAD s/p CABG in 2006 ER visit 01/09/2020 for chest pain.  Ruled  out for PE and ACS. Overnight fellow consulted however family and patient were wanting to go home. Ultimately suspected it was from GERD and patient was discharged chest pain free. Since then the patient reports very brief left sided chest discomfort, not exertional. SOB only with the mask. Strength has been improving with PT. Cath in 2015 showed patent grafts. He does have known complex severe disease in the LCx and in the mid RCA that were not grafted. Repeat cath 11/2019 unable to open the LCx, RCA chronically occluded and not suitable for PCI. Continue Ranexa. Amlodipine previously discontinued for hypotension. NO BB for bradycardia. Since patient is not having any functionally limiting or exertional chest pain would not do anything at the moment. EKG and BP good today. If he starts having symptoms can consider adding back amlodipine vs Imdur.   Hyperlipidemia Continue statin. Most recent labs showed Chol 120, HDL 35, LDL 60, TG 125  DM2 Most recent A1C 6.8   HTN Amlodipine previously stopped due to hypotension. BP stable at ER visit in July and today 128/54  Atrial tachycardia Continue amiodarone  GERD Plans to follow-up with GI  Current medicines are reviewed at length with the patient today.  The patient does not have concerns  regarding medicines.  The following changes have been made:  no change  Labs/ tests ordered today include:  No orders of the defined types were placed in this encounter.    Disposition:   FU with Peter [name], MD in 6 months  Signed, Jenisa Monty Jorene Minors 03/05/2020 6:47 PM    [place] Phone: [phone]; Fax: [phone]

## 2020-03-06 ENCOUNTER — Encounter: Payer: Self-pay | Admitting: Physician Assistant

## 2020-03-06 ENCOUNTER — Other Ambulatory Visit: Payer: Self-pay

## 2020-03-06 ENCOUNTER — Ambulatory Visit (INDEPENDENT_AMBULATORY_CARE_PROVIDER_SITE_OTHER): Payer: Medicare PPO | Admitting: Physician Assistant

## 2020-03-06 VITALS — BP 128/54 | HR 63 | Ht 69.5 in | Wt 176.6 lb

## 2020-03-06 DIAGNOSIS — K219 Gastro-esophageal reflux disease without esophagitis: Secondary | ICD-10-CM

## 2020-03-06 DIAGNOSIS — E785 Hyperlipidemia, unspecified: Secondary | ICD-10-CM

## 2020-03-06 DIAGNOSIS — I2510 Atherosclerotic heart disease of native coronary artery without angina pectoris: Secondary | ICD-10-CM

## 2020-03-06 DIAGNOSIS — I10 Essential (primary) hypertension: Secondary | ICD-10-CM

## 2020-03-06 DIAGNOSIS — I2583 Coronary atherosclerosis due to lipid rich plaque: Secondary | ICD-10-CM | POA: Diagnosis not present

## 2020-03-06 NOTE — Patient Instructions (Signed)
Medication Instructions:  Your physician recommends that you continue on your current medications as directed. Please refer to the Current Medication list given to you today.  *If you need a refill on your cardiac medications before your next appointment, please call your pharmacy*   Follow-Up: At [facility], you and your health needs are our priority.  As part of our continuing mission to provide you with exceptional heart care, we have created designated Provider Care Teams.  These Care Teams include your primary Cardiologist (physician) and Advanced Practice Providers (APPs -  Physician Assistants and Nurse Practitioners) who all work together to provide you with the care you need, when you need it.  We recommend signing up for the patient portal called "MyChart".  Sign up information is provided on this After Visit Summary.  MyChart is used to connect with patients for Virtual Visits (Telemedicine).  Patients are able to view lab/test results, encounter notes, upcoming appointments, etc.  Non-urgent messages can be sent to your provider as well.   To learn more about what you can do with MyChart, go to [email or web address].    Your next appointment:   December  The format for your next appointment:   In Person  Provider:   Peter [name], MD

## 2020-03-09 DIAGNOSIS — N401 Enlarged prostate with lower urinary tract symptoms: Secondary | ICD-10-CM | POA: Diagnosis not present

## 2020-03-09 DIAGNOSIS — I10 Essential (primary) hypertension: Secondary | ICD-10-CM | POA: Diagnosis not present

## 2020-03-09 DIAGNOSIS — I25810 Atherosclerosis of coronary artery bypass graft(s) without angina pectoris: Secondary | ICD-10-CM | POA: Diagnosis not present

## 2020-03-09 DIAGNOSIS — D649 Anemia, unspecified: Secondary | ICD-10-CM | POA: Diagnosis not present

## 2020-03-09 DIAGNOSIS — Z466 Encounter for fitting and adjustment of urinary device: Secondary | ICD-10-CM | POA: Diagnosis not present

## 2020-03-09 DIAGNOSIS — E119 Type 2 diabetes mellitus without complications: Secondary | ICD-10-CM | POA: Diagnosis not present

## 2020-03-09 DIAGNOSIS — N390 Urinary tract infection, site not specified: Secondary | ICD-10-CM | POA: Diagnosis not present

## 2020-03-09 DIAGNOSIS — H3530 Unspecified macular degeneration: Secondary | ICD-10-CM | POA: Diagnosis not present

## 2020-03-09 DIAGNOSIS — E785 Hyperlipidemia, unspecified: Secondary | ICD-10-CM | POA: Diagnosis not present

## 2020-03-10 DIAGNOSIS — Z6825 Body mass index (BMI) 25.0-25.9, adult: Secondary | ICD-10-CM | POA: Diagnosis not present

## 2020-03-10 DIAGNOSIS — I10 Essential (primary) hypertension: Secondary | ICD-10-CM | POA: Diagnosis not present

## 2020-03-10 DIAGNOSIS — I700 Atherosclerosis of aorta: Secondary | ICD-10-CM | POA: Diagnosis not present

## 2020-03-10 DIAGNOSIS — D638 Anemia in other chronic diseases classified elsewhere: Secondary | ICD-10-CM | POA: Diagnosis not present

## 2020-03-10 DIAGNOSIS — I25810 Atherosclerosis of coronary artery bypass graft(s) without angina pectoris: Secondary | ICD-10-CM | POA: Diagnosis not present

## 2020-03-10 DIAGNOSIS — E785 Hyperlipidemia, unspecified: Secondary | ICD-10-CM | POA: Diagnosis not present

## 2020-03-10 DIAGNOSIS — E1165 Type 2 diabetes mellitus with hyperglycemia: Secondary | ICD-10-CM | POA: Diagnosis not present

## 2020-03-10 DIAGNOSIS — K219 Gastro-esophageal reflux disease without esophagitis: Secondary | ICD-10-CM | POA: Diagnosis not present

## 2020-03-10 DIAGNOSIS — E1140 Type 2 diabetes mellitus with diabetic neuropathy, unspecified: Secondary | ICD-10-CM | POA: Diagnosis not present

## 2020-03-16 DIAGNOSIS — H3530 Unspecified macular degeneration: Secondary | ICD-10-CM | POA: Diagnosis not present

## 2020-03-16 DIAGNOSIS — N390 Urinary tract infection, site not specified: Secondary | ICD-10-CM | POA: Diagnosis not present

## 2020-03-16 DIAGNOSIS — N401 Enlarged prostate with lower urinary tract symptoms: Secondary | ICD-10-CM | POA: Diagnosis not present

## 2020-03-16 DIAGNOSIS — I10 Essential (primary) hypertension: Secondary | ICD-10-CM | POA: Diagnosis not present

## 2020-03-16 DIAGNOSIS — E119 Type 2 diabetes mellitus without complications: Secondary | ICD-10-CM | POA: Diagnosis not present

## 2020-03-16 DIAGNOSIS — Z466 Encounter for fitting and adjustment of urinary device: Secondary | ICD-10-CM | POA: Diagnosis not present

## 2020-03-16 DIAGNOSIS — D649 Anemia, unspecified: Secondary | ICD-10-CM | POA: Diagnosis not present

## 2020-03-16 DIAGNOSIS — I25810 Atherosclerosis of coronary artery bypass graft(s) without angina pectoris: Secondary | ICD-10-CM | POA: Diagnosis not present

## 2020-03-16 DIAGNOSIS — E785 Hyperlipidemia, unspecified: Secondary | ICD-10-CM | POA: Diagnosis not present

## 2020-03-17 DIAGNOSIS — C61 Malignant neoplasm of prostate: Secondary | ICD-10-CM | POA: Diagnosis not present

## 2020-03-17 DIAGNOSIS — R310 Gross hematuria: Secondary | ICD-10-CM | POA: Diagnosis not present

## 2020-03-18 DIAGNOSIS — K5900 Constipation, unspecified: Secondary | ICD-10-CM | POA: Diagnosis not present

## 2020-03-18 DIAGNOSIS — K219 Gastro-esophageal reflux disease without esophagitis: Secondary | ICD-10-CM | POA: Diagnosis not present

## 2020-03-18 DIAGNOSIS — K5730 Diverticulosis of large intestine without perforation or abscess without bleeding: Secondary | ICD-10-CM | POA: Diagnosis not present

## 2020-03-20 DIAGNOSIS — R339 Retention of urine, unspecified: Secondary | ICD-10-CM | POA: Diagnosis not present

## 2020-03-23 DIAGNOSIS — H3530 Unspecified macular degeneration: Secondary | ICD-10-CM | POA: Diagnosis not present

## 2020-03-23 DIAGNOSIS — D649 Anemia, unspecified: Secondary | ICD-10-CM | POA: Diagnosis not present

## 2020-03-23 DIAGNOSIS — N390 Urinary tract infection, site not specified: Secondary | ICD-10-CM | POA: Diagnosis not present

## 2020-03-23 DIAGNOSIS — E119 Type 2 diabetes mellitus without complications: Secondary | ICD-10-CM | POA: Diagnosis not present

## 2020-03-23 DIAGNOSIS — N401 Enlarged prostate with lower urinary tract symptoms: Secondary | ICD-10-CM | POA: Diagnosis not present

## 2020-03-23 DIAGNOSIS — I25810 Atherosclerosis of coronary artery bypass graft(s) without angina pectoris: Secondary | ICD-10-CM | POA: Diagnosis not present

## 2020-03-23 DIAGNOSIS — I10 Essential (primary) hypertension: Secondary | ICD-10-CM | POA: Diagnosis not present

## 2020-03-23 DIAGNOSIS — E785 Hyperlipidemia, unspecified: Secondary | ICD-10-CM | POA: Diagnosis not present

## 2020-03-23 DIAGNOSIS — Z466 Encounter for fitting and adjustment of urinary device: Secondary | ICD-10-CM | POA: Diagnosis not present

## 2020-03-27 DIAGNOSIS — I25810 Atherosclerosis of coronary artery bypass graft(s) without angina pectoris: Secondary | ICD-10-CM | POA: Diagnosis not present

## 2020-03-27 DIAGNOSIS — Z466 Encounter for fitting and adjustment of urinary device: Secondary | ICD-10-CM | POA: Diagnosis not present

## 2020-03-27 DIAGNOSIS — E119 Type 2 diabetes mellitus without complications: Secondary | ICD-10-CM | POA: Diagnosis not present

## 2020-03-27 DIAGNOSIS — I10 Essential (primary) hypertension: Secondary | ICD-10-CM | POA: Diagnosis not present

## 2020-03-27 DIAGNOSIS — N390 Urinary tract infection, site not specified: Secondary | ICD-10-CM | POA: Diagnosis not present

## 2020-03-27 DIAGNOSIS — N401 Enlarged prostate with lower urinary tract symptoms: Secondary | ICD-10-CM | POA: Diagnosis not present

## 2020-03-27 DIAGNOSIS — H3530 Unspecified macular degeneration: Secondary | ICD-10-CM | POA: Diagnosis not present

## 2020-03-27 DIAGNOSIS — E785 Hyperlipidemia, unspecified: Secondary | ICD-10-CM | POA: Diagnosis not present

## 2020-03-27 DIAGNOSIS — D649 Anemia, unspecified: Secondary | ICD-10-CM | POA: Diagnosis not present

## 2020-03-28 DIAGNOSIS — H3530 Unspecified macular degeneration: Secondary | ICD-10-CM | POA: Diagnosis not present

## 2020-03-28 DIAGNOSIS — N390 Urinary tract infection, site not specified: Secondary | ICD-10-CM | POA: Diagnosis not present

## 2020-03-28 DIAGNOSIS — E785 Hyperlipidemia, unspecified: Secondary | ICD-10-CM | POA: Diagnosis not present

## 2020-03-28 DIAGNOSIS — I25810 Atherosclerosis of coronary artery bypass graft(s) without angina pectoris: Secondary | ICD-10-CM | POA: Diagnosis not present

## 2020-03-28 DIAGNOSIS — E119 Type 2 diabetes mellitus without complications: Secondary | ICD-10-CM | POA: Diagnosis not present

## 2020-03-28 DIAGNOSIS — I10 Essential (primary) hypertension: Secondary | ICD-10-CM | POA: Diagnosis not present

## 2020-03-28 DIAGNOSIS — N401 Enlarged prostate with lower urinary tract symptoms: Secondary | ICD-10-CM | POA: Diagnosis not present

## 2020-03-28 DIAGNOSIS — Z466 Encounter for fitting and adjustment of urinary device: Secondary | ICD-10-CM | POA: Diagnosis not present

## 2020-03-28 DIAGNOSIS — D649 Anemia, unspecified: Secondary | ICD-10-CM | POA: Diagnosis not present

## 2020-03-30 DIAGNOSIS — R1084 Generalized abdominal pain: Secondary | ICD-10-CM | POA: Diagnosis not present

## 2020-03-30 DIAGNOSIS — K760 Fatty (change of) liver, not elsewhere classified: Secondary | ICD-10-CM | POA: Diagnosis not present

## 2020-03-30 DIAGNOSIS — R109 Unspecified abdominal pain: Secondary | ICD-10-CM | POA: Diagnosis not present

## 2020-03-30 DIAGNOSIS — B9689 Other specified bacterial agents as the cause of diseases classified elsewhere: Secondary | ICD-10-CM | POA: Diagnosis not present

## 2020-03-30 DIAGNOSIS — N390 Urinary tract infection, site not specified: Secondary | ICD-10-CM | POA: Diagnosis not present

## 2020-03-30 DIAGNOSIS — K529 Noninfective gastroenteritis and colitis, unspecified: Secondary | ICD-10-CM | POA: Diagnosis not present

## 2020-04-01 DIAGNOSIS — I25810 Atherosclerosis of coronary artery bypass graft(s) without angina pectoris: Secondary | ICD-10-CM | POA: Diagnosis not present

## 2020-04-01 DIAGNOSIS — D649 Anemia, unspecified: Secondary | ICD-10-CM | POA: Diagnosis not present

## 2020-04-01 DIAGNOSIS — Z466 Encounter for fitting and adjustment of urinary device: Secondary | ICD-10-CM | POA: Diagnosis not present

## 2020-04-01 DIAGNOSIS — E119 Type 2 diabetes mellitus without complications: Secondary | ICD-10-CM | POA: Diagnosis not present

## 2020-04-01 DIAGNOSIS — H3530 Unspecified macular degeneration: Secondary | ICD-10-CM | POA: Diagnosis not present

## 2020-04-01 DIAGNOSIS — N401 Enlarged prostate with lower urinary tract symptoms: Secondary | ICD-10-CM | POA: Diagnosis not present

## 2020-04-01 DIAGNOSIS — N390 Urinary tract infection, site not specified: Secondary | ICD-10-CM | POA: Diagnosis not present

## 2020-04-01 DIAGNOSIS — E785 Hyperlipidemia, unspecified: Secondary | ICD-10-CM | POA: Diagnosis not present

## 2020-04-01 DIAGNOSIS — I10 Essential (primary) hypertension: Secondary | ICD-10-CM | POA: Diagnosis not present

## 2020-04-06 ENCOUNTER — Ambulatory Visit: Payer: Medicare PPO | Admitting: Cardiology

## 2020-04-06 DIAGNOSIS — R310 Gross hematuria: Secondary | ICD-10-CM | POA: Diagnosis not present

## 2020-04-06 DIAGNOSIS — N3090 Cystitis, unspecified without hematuria: Secondary | ICD-10-CM | POA: Diagnosis not present

## 2020-04-07 DIAGNOSIS — E785 Hyperlipidemia, unspecified: Secondary | ICD-10-CM | POA: Diagnosis not present

## 2020-04-07 DIAGNOSIS — N390 Urinary tract infection, site not specified: Secondary | ICD-10-CM | POA: Diagnosis not present

## 2020-04-07 DIAGNOSIS — E119 Type 2 diabetes mellitus without complications: Secondary | ICD-10-CM | POA: Diagnosis not present

## 2020-04-07 DIAGNOSIS — R110 Nausea: Secondary | ICD-10-CM | POA: Diagnosis not present

## 2020-04-07 DIAGNOSIS — N401 Enlarged prostate with lower urinary tract symptoms: Secondary | ICD-10-CM | POA: Diagnosis not present

## 2020-04-07 DIAGNOSIS — I10 Essential (primary) hypertension: Secondary | ICD-10-CM | POA: Diagnosis not present

## 2020-04-07 DIAGNOSIS — D649 Anemia, unspecified: Secondary | ICD-10-CM | POA: Diagnosis not present

## 2020-04-07 DIAGNOSIS — R109 Unspecified abdominal pain: Secondary | ICD-10-CM | POA: Diagnosis not present

## 2020-04-07 DIAGNOSIS — Z466 Encounter for fitting and adjustment of urinary device: Secondary | ICD-10-CM | POA: Diagnosis not present

## 2020-04-07 DIAGNOSIS — H3530 Unspecified macular degeneration: Secondary | ICD-10-CM | POA: Diagnosis not present

## 2020-04-07 DIAGNOSIS — Z23 Encounter for immunization: Secondary | ICD-10-CM | POA: Diagnosis not present

## 2020-04-07 DIAGNOSIS — I25810 Atherosclerosis of coronary artery bypass graft(s) without angina pectoris: Secondary | ICD-10-CM | POA: Diagnosis not present

## 2020-04-13 DIAGNOSIS — R2681 Unsteadiness on feet: Secondary | ICD-10-CM | POA: Diagnosis not present

## 2020-04-15 DIAGNOSIS — I25810 Atherosclerosis of coronary artery bypass graft(s) without angina pectoris: Secondary | ICD-10-CM | POA: Diagnosis not present

## 2020-04-15 DIAGNOSIS — N401 Enlarged prostate with lower urinary tract symptoms: Secondary | ICD-10-CM | POA: Diagnosis not present

## 2020-04-15 DIAGNOSIS — H3530 Unspecified macular degeneration: Secondary | ICD-10-CM | POA: Diagnosis not present

## 2020-04-15 DIAGNOSIS — I10 Essential (primary) hypertension: Secondary | ICD-10-CM | POA: Diagnosis not present

## 2020-04-15 DIAGNOSIS — E119 Type 2 diabetes mellitus without complications: Secondary | ICD-10-CM | POA: Diagnosis not present

## 2020-04-15 DIAGNOSIS — N390 Urinary tract infection, site not specified: Secondary | ICD-10-CM | POA: Diagnosis not present

## 2020-04-15 DIAGNOSIS — D649 Anemia, unspecified: Secondary | ICD-10-CM | POA: Diagnosis not present

## 2020-04-15 DIAGNOSIS — E785 Hyperlipidemia, unspecified: Secondary | ICD-10-CM | POA: Diagnosis not present

## 2020-04-15 DIAGNOSIS — Z466 Encounter for fitting and adjustment of urinary device: Secondary | ICD-10-CM | POA: Diagnosis not present

## 2020-04-20 DIAGNOSIS — I25810 Atherosclerosis of coronary artery bypass graft(s) without angina pectoris: Secondary | ICD-10-CM | POA: Diagnosis not present

## 2020-04-20 DIAGNOSIS — Z466 Encounter for fitting and adjustment of urinary device: Secondary | ICD-10-CM | POA: Diagnosis not present

## 2020-04-20 DIAGNOSIS — I10 Essential (primary) hypertension: Secondary | ICD-10-CM | POA: Diagnosis not present

## 2020-04-20 DIAGNOSIS — K5900 Constipation, unspecified: Secondary | ICD-10-CM | POA: Diagnosis not present

## 2020-04-20 DIAGNOSIS — N390 Urinary tract infection, site not specified: Secondary | ICD-10-CM | POA: Diagnosis not present

## 2020-04-20 DIAGNOSIS — E119 Type 2 diabetes mellitus without complications: Secondary | ICD-10-CM | POA: Diagnosis not present

## 2020-04-20 DIAGNOSIS — D649 Anemia, unspecified: Secondary | ICD-10-CM | POA: Diagnosis not present

## 2020-04-20 DIAGNOSIS — R1032 Left lower quadrant pain: Secondary | ICD-10-CM | POA: Diagnosis not present

## 2020-04-20 DIAGNOSIS — H3530 Unspecified macular degeneration: Secondary | ICD-10-CM | POA: Diagnosis not present

## 2020-04-20 DIAGNOSIS — E785 Hyperlipidemia, unspecified: Secondary | ICD-10-CM | POA: Diagnosis not present

## 2020-04-20 DIAGNOSIS — N401 Enlarged prostate with lower urinary tract symptoms: Secondary | ICD-10-CM | POA: Diagnosis not present

## 2020-04-21 DIAGNOSIS — M6281 Muscle weakness (generalized): Secondary | ICD-10-CM | POA: Diagnosis not present

## 2020-04-21 DIAGNOSIS — R2681 Unsteadiness on feet: Secondary | ICD-10-CM | POA: Diagnosis not present

## 2020-04-23 DIAGNOSIS — R2681 Unsteadiness on feet: Secondary | ICD-10-CM | POA: Diagnosis not present

## 2020-04-23 DIAGNOSIS — M6281 Muscle weakness (generalized): Secondary | ICD-10-CM | POA: Diagnosis not present

## 2020-04-25 DIAGNOSIS — M25462 Effusion, left knee: Secondary | ICD-10-CM | POA: Diagnosis not present

## 2020-04-25 DIAGNOSIS — R220 Localized swelling, mass and lump, head: Secondary | ICD-10-CM | POA: Diagnosis not present

## 2020-04-25 DIAGNOSIS — R222 Localized swelling, mass and lump, trunk: Secondary | ICD-10-CM | POA: Diagnosis not present

## 2020-04-25 DIAGNOSIS — R58 Hemorrhage, not elsewhere classified: Secondary | ICD-10-CM | POA: Diagnosis not present

## 2020-04-25 DIAGNOSIS — S0101XA Laceration without foreign body of scalp, initial encounter: Secondary | ICD-10-CM | POA: Diagnosis not present

## 2020-04-25 DIAGNOSIS — M25461 Effusion, right knee: Secondary | ICD-10-CM | POA: Diagnosis not present

## 2020-04-25 DIAGNOSIS — W19XXXA Unspecified fall, initial encounter: Secondary | ICD-10-CM | POA: Diagnosis not present

## 2020-04-25 DIAGNOSIS — I10 Essential (primary) hypertension: Secondary | ICD-10-CM | POA: Diagnosis not present

## 2020-04-25 DIAGNOSIS — R55 Syncope and collapse: Secondary | ICD-10-CM | POA: Diagnosis not present

## 2020-04-25 DIAGNOSIS — M160 Bilateral primary osteoarthritis of hip: Secondary | ICD-10-CM | POA: Diagnosis not present

## 2020-04-25 DIAGNOSIS — R4182 Altered mental status, unspecified: Secondary | ICD-10-CM | POA: Diagnosis not present

## 2020-04-25 DIAGNOSIS — J9811 Atelectasis: Secondary | ICD-10-CM | POA: Diagnosis not present

## 2020-04-26 DIAGNOSIS — I340 Nonrheumatic mitral (valve) insufficiency: Secondary | ICD-10-CM | POA: Diagnosis not present

## 2020-04-26 DIAGNOSIS — E119 Type 2 diabetes mellitus without complications: Secondary | ICD-10-CM | POA: Diagnosis not present

## 2020-04-26 DIAGNOSIS — I351 Nonrheumatic aortic (valve) insufficiency: Secondary | ICD-10-CM | POA: Diagnosis not present

## 2020-04-26 DIAGNOSIS — S0101XA Laceration without foreign body of scalp, initial encounter: Secondary | ICD-10-CM | POA: Diagnosis not present

## 2020-04-26 DIAGNOSIS — M1990 Unspecified osteoarthritis, unspecified site: Secondary | ICD-10-CM | POA: Diagnosis not present

## 2020-04-27 DIAGNOSIS — E119 Type 2 diabetes mellitus without complications: Secondary | ICD-10-CM | POA: Diagnosis not present

## 2020-04-27 DIAGNOSIS — S0191XA Laceration without foreign body of unspecified part of head, initial encounter: Secondary | ICD-10-CM | POA: Diagnosis not present

## 2020-04-27 DIAGNOSIS — M1990 Unspecified osteoarthritis, unspecified site: Secondary | ICD-10-CM | POA: Diagnosis not present

## 2020-04-27 DIAGNOSIS — S0101XA Laceration without foreign body of scalp, initial encounter: Secondary | ICD-10-CM | POA: Diagnosis not present

## 2020-04-28 DIAGNOSIS — R2681 Unsteadiness on feet: Secondary | ICD-10-CM | POA: Diagnosis not present

## 2020-04-28 DIAGNOSIS — M6281 Muscle weakness (generalized): Secondary | ICD-10-CM | POA: Diagnosis not present

## 2020-04-30 DIAGNOSIS — E038 Other specified hypothyroidism: Secondary | ICD-10-CM | POA: Diagnosis not present

## 2020-04-30 DIAGNOSIS — R2681 Unsteadiness on feet: Secondary | ICD-10-CM | POA: Diagnosis not present

## 2020-04-30 DIAGNOSIS — M6281 Muscle weakness (generalized): Secondary | ICD-10-CM | POA: Diagnosis not present

## 2020-04-30 DIAGNOSIS — Y92009 Unspecified place in unspecified non-institutional (private) residence as the place of occurrence of the external cause: Secondary | ICD-10-CM | POA: Diagnosis not present

## 2020-04-30 DIAGNOSIS — W19XXXA Unspecified fall, initial encounter: Secondary | ICD-10-CM | POA: Diagnosis not present

## 2020-04-30 DIAGNOSIS — I10 Essential (primary) hypertension: Secondary | ICD-10-CM | POA: Diagnosis not present

## 2020-04-30 DIAGNOSIS — R262 Difficulty in walking, not elsewhere classified: Secondary | ICD-10-CM | POA: Diagnosis not present

## 2020-05-04 DIAGNOSIS — R262 Difficulty in walking, not elsewhere classified: Secondary | ICD-10-CM | POA: Diagnosis not present

## 2020-05-04 DIAGNOSIS — R2681 Unsteadiness on feet: Secondary | ICD-10-CM | POA: Diagnosis not present

## 2020-05-04 DIAGNOSIS — M6281 Muscle weakness (generalized): Secondary | ICD-10-CM | POA: Diagnosis not present

## 2020-05-06 DIAGNOSIS — R2681 Unsteadiness on feet: Secondary | ICD-10-CM | POA: Diagnosis not present

## 2020-05-06 DIAGNOSIS — M6281 Muscle weakness (generalized): Secondary | ICD-10-CM | POA: Diagnosis not present

## 2020-05-11 DIAGNOSIS — M6281 Muscle weakness (generalized): Secondary | ICD-10-CM | POA: Diagnosis not present

## 2020-05-11 DIAGNOSIS — R2681 Unsteadiness on feet: Secondary | ICD-10-CM | POA: Diagnosis not present

## 2020-05-13 DIAGNOSIS — M6281 Muscle weakness (generalized): Secondary | ICD-10-CM | POA: Diagnosis not present

## 2020-05-13 DIAGNOSIS — R2681 Unsteadiness on feet: Secondary | ICD-10-CM | POA: Diagnosis not present

## 2020-05-18 DIAGNOSIS — C61 Malignant neoplasm of prostate: Secondary | ICD-10-CM | POA: Diagnosis not present

## 2020-05-18 DIAGNOSIS — N3090 Cystitis, unspecified without hematuria: Secondary | ICD-10-CM | POA: Diagnosis not present

## 2020-05-18 DIAGNOSIS — R2681 Unsteadiness on feet: Secondary | ICD-10-CM | POA: Diagnosis not present

## 2020-05-18 DIAGNOSIS — M6281 Muscle weakness (generalized): Secondary | ICD-10-CM | POA: Diagnosis not present

## 2020-05-20 DIAGNOSIS — I10 Essential (primary) hypertension: Secondary | ICD-10-CM | POA: Diagnosis not present

## 2020-05-20 DIAGNOSIS — M6281 Muscle weakness (generalized): Secondary | ICD-10-CM | POA: Diagnosis not present

## 2020-05-20 DIAGNOSIS — R2681 Unsteadiness on feet: Secondary | ICD-10-CM | POA: Diagnosis not present

## 2020-05-22 NOTE — Progress Notes (Signed)
Alexander Choi Date of Birth: [birth year] Medical Record #[id number]  History of Present Illness: Alexander Choi is seen for follow up CAD. He has known CAD with prior CABG in Sep 16, 2004, negative Myoview in October 2015 showed inferior ischemia. Cardiac cath showed all grafts patent. There was severe disease in the native LCx that predated his CABG. Other issues include DM, HTN and HLD.   On follow up today he is doing well from a cardiac standpoint.  He denies any dyspnea or chest pain. He is taking Tagamet for some heartburn. His wife passed away in 09/16/2017. He was admitted to [facility] in October 2020 with bowel obstruction. He had surgery with lysis of adhesions. Was in hospital for 16 days. One week later he fell in his garage and reinjured his shoulder. Had shoulder surgery in December.   He was seen in the ED in 2022/09/17 with dyspnea and symptoms of severe indigestion. He has been aggressively treated for indigestion with Tagamet and Protonix without improvement. Notes he had EGD 2 months ago that showed only mild inflammation. States symptoms are worse after eating a meal but also occur with activity. SOB is definitely worse with activity. States he can only swing a gold club 5-6 times before he has to stop. Doesn't take Ntg because it makes him sick. Notes some discomfort in his left arm but has also had carpel tunnel and shoulder surgery done this past year.  Because of this we performed cardiac cath. Right heart pressures were normal. His angiogram was stable. We did attempt to open the chronically occluded LCx but were unsuccessful due to inability to cross the lesion with a wire. In retrospect this vessel was really unchanged dating back before his CABG.  The patient was seen in the ER 01/09/20 for chest pain. He reported it was left-sided and pressure like in nature. No radiation. Also had more belching. Sob for the last 2 months. CP not worse on exertion. Could be worse after eating. No LLe,  orthopnea, N/V fever. Aspirin given at home and pain improved. BP there was 155/59. Patient ruled otu for ACS/PE.  CP resolved after GI cocktail.   He since the last ER visit he had a UTI requiring hospitalization and was subsequently discharged to a SNF for month of August. Has since continued home PT.  Previously BP had dropped and amlodipine was held. He states his BP then shot up to 185 systolic so amlodipine was resumed. Notes he is still weak but PT is helping.  Blood sugar is well controlled.   Current Outpatient Medications  Medication Sig Dispense Refill   Accu-Chek Softclix Lancets lancets      Accu-Chek Softclix Lancets lancets      amLODipine (NORVASC) 5 MG tablet Take 5 mg by mouth at bedtime.     aspirin 81 MG tablet Take 81 mg by mouth daily.       atorvastatin (LIPITOR) 80 MG tablet Take 1 tablet (80 mg total) by mouth daily. 90 tablet 3   cimetidine (TAGAMET) 400 MG tablet Take 400 mg by mouth daily as needed (heartburn).      famotidine (PEPCID) 40 MG tablet      gabapentin (NEURONTIN) 100 MG capsule Take 100 mg by mouth 2 (two) times daily. Pt takes 1 tablet three times daily     glimepiride (AMARYL) 4 MG tablet Take 4 mg by mouth every morning.     glucose blood (ACCU-CHEK AVIVA PLUS) test strip  Hyoscyamine Sulfate SL 0.125 MG SUBL Take 0.125 mg by mouth daily as needed (Diarrhea).      losartan (COZAAR) 100 MG tablet Take 100 mg by mouth daily.      metFORMIN (GLUCOPHAGE) 500 MG tablet Take 1 tablet (500 mg total) by mouth 2 (two) times daily with a meal. (Patient taking differently: Take 500 mg by mouth 2 (two) times daily with a meal. Pt takes 1 tablet daily.)     Multiple Vitamins-Minerals (ICAPS AREDS 2 PO) Take 1 Cartridge by mouth in the morning and at bedtime.     nitroGLYCERIN (NITROSTAT) 0.4 MG SL tablet Place 1 tablet (0.4 mg total) under the tongue every 5 (five) minutes as needed for chest pain. 90 tablet 3   NONFORMULARY OR COMPOUNDED  ITEM [facility]:  Peripheral Neuropathy Cream - Bupivacaine 1%, doxepin 3%, Gabapentin 6%, Pentoxifylline 3%, Topiramate 1%, apply 1-2 grams to affected area 3-4 times daily. (Patient taking differently: Apply 1 application topically once a week. [facility]:  Peripheral Neuropathy Cream - Bupivacaine 1%, doxepin 3%, Gabapentin 6%, Pentoxifylline 3%, Topiramate 1%, apply 1-2 grams to affected area 3-4 times daily.) 120 each 3   OVER THE COUNTER MEDICATION Take 2 capsules by mouth daily. [employer] Neuropathy Support Formula (B1 & B12 Vitamins)     PACERONE 200 MG tablet Take 200 mg by mouth daily.     ranolazine (RANEXA) 500 MG 12 hr tablet Take 1 tablet (500 mg total) by mouth 2 (two) times daily. 180 tablet 3   pantoprazole (PROTONIX) 40 MG tablet Take 1 tablet (40 mg total) by mouth daily. (Patient taking differently: Take 40 mg by mouth 2 (two) times daily. ) 30 tablet 0   Current Facility-Administered Medications  Medication Dose Route Frequency Provider Last Rate Last Admin   sodium chloride flush (NS) 0.9 % injection 3 mL  3 mL Intravenous Q12H [name], Asyah Candler M, MD        Allergies  Allergen Reactions   Ace Inhibitors Other (See Comments)    unknown   Nitrates, Organic Swelling    Swelling in hands   Shellfish Allergy Nausea Only    Pt states scallops-not all shellfish-GI symptoms/ nausea -50+ years ago   Penicillins Rash    Past Medical History:  Diagnosis Date   Cancer ([place])    Chronic kidney disease    kidney stones   Coronary artery disease    with CABG in 2006 with LIMA to LAD, SVG to mid and distal OM, SVG to AM. Normal Myoview in May of 2013   Diabetes mellitus    Type 2   GERD (gastroesophageal reflux disease)    Hematuria    Hypercholesterolemia    Hypertension    Irritable bowel syndrome    Loose stools    Prostate cancer ([place])    Urinary obstruction     Past Surgical History:  Procedure Laterality Date   CHOLECYSTECTOMY      CORONARY ARTERY BYPASS GRAFT  2006   LEFT HEART CATHETERIZATION WITH CORONARY/GRAFT ANGIOGRAM N/A 04/11/2014   Procedure: LEFT HEART CATHETERIZATION WITH [name];  Surgeon: Luciel Brickman M [name], MD;  Location: [facility] CATH LAB;  Service: Cardiovascular;  Laterality: N/A;   OTHER SURGICAL HISTORY     bowel obstruction   RADIOACTIVE SEED IMPLANT     for prostate cancer   RIGHT/LEFT HEART CATH AND CORONARY/GRAFT ANGIOGRAPHY N/A 12/12/2019   Procedure: RIGHT/LEFT HEART CATH AND CORONARY/GRAFT ANGIOGRAPHY;  Surgeon: [name], Scherry Laverne M, MD;  Location: [facility]  CV LAB;  Service: Cardiovascular;  Laterality: N/A;   SKIN CANCER EXCISION     TEMPORARY PACEMAKER N/A 12/13/2019   Procedure: TEMPORARY PACEMAKER;  Surgeon: [name], Jerrod Damiano M, MD;  Location: [place] CV LAB;  Service: Cardiovascular;  Laterality: N/A;    Social History   Tobacco Use  Smoking Status Former Smoker   Packs/day: 3.00  Smokeless Tobacco Never Used    Social History   Substance and Sexual Activity  Alcohol Use No    Family History  Problem Relation Age of Onset   Cancer Mother        Leukemia   Hypertension Mother    Cancer Father        Colon   Heart attack Father        Had Several in his 74's   Hypertension Father    Diabetes Father     Review of Systems: The review of systems is per the HPI.  All other systems were reviewed and are negative.  Physical Exam: BP (!) 154/58 (BP Location: Left Arm, Patient Position: Sitting)    Pulse 61    Ht 5\' 10"  (1.778 m)    Wt 179 lb 11.2 oz (81.5 kg)    SpO2 99%    BMI 25.78 kg/m  GENERAL:  Well appearing elderly WM HEENT:  PERRL, EOMI, sclera are clear. Oropharynx is clear. NECK:  No jugular venous distention, carotid upstroke brisk and symmetric, no bruits, no thyromegaly or adenopathy LUNGS:  Decreased BS in bases L>R. No rales.  CHEST:  Unremarkable HEART:  RRR,  PMI not displaced or sustained,S1 and S2 within normal limits, no S3, no S4:  no clicks, no rubs, no murmurs ABD:  Soft, nontender. BS +, no masses or bruits. No hepatomegaly, no splenomegaly EXT:  2 + pulses throughout, no edema, no cyanosis no clubbing. Brace on left ankle.  SKIN:  Warm and dry.  No rashes NEURO:  Alert and oriented x 3. Cranial nerves II through XII intact. PSYCH:  Cognitively intact   Wt Readings from Last 3 Encounters:  05/27/20 179 lb 11.2 oz (81.5 kg)  03/06/20 176 lb 9.6 oz (80.1 kg)  01/08/20 175 lb (79.4 kg)    LABORATORY DATA/PROCEDURES:  Lab Results  Component Value Date   WBC 7.3 01/08/2020   HGB 12.1 (L) 01/08/2020   HCT 36.6 (L) 01/08/2020   PLT 230 01/08/2020   GLUCOSE 104 (H) 01/08/2020   CHOL 120 12/16/2019   TRIG 125 12/16/2019   HDL 35 (L) 12/16/2019   LDLCALC 60 12/16/2019   ALT 14 12/16/2019   AST 20 12/16/2019   NA 135 01/08/2020   K 4.2 01/08/2020   CL 97 (L) 01/08/2020   CREATININE 0.71 01/08/2020   BUN 16 01/08/2020   CO2 26 01/08/2020   TSH 6.725 (H) 12/15/2019   INR 0.96 04/08/2014   HGBA1C 6.8 (H) 12/12/2019    BNP (last 3 results) No results for input(s): PROBNP in the last 8760 hours.  Labs reviewed from 01/04/16: cholesterol 123, triglycerides 151, HDL 36, LDL 57. CBC and CMET normal.  Dated 07/08/16: cholesterol 116, triglycerides 205, HDL 30, LDL 45. CMET and CBC normal. Dated 01/18/17: Hgb 12.2. CMET normal. Cholesterol 105, triglycerides 152, HDL 28, LDL 47. A1c 6.3%. Dated 07/25/17: Hgb 12.5. Glucose 124. Other chemistries normal. Cholesterol 107, Triglycerides 139, HDL 29, LDL 50. A1c 7.2%. Iron studies normal.  Dated 5.1.19: cholesterol 107, triglycerides 109, HDL 29, LDL 56. A1c 7.5%. CBC and chemistries normal.  Dated 05/29/19: cholesterol 142, triglycerides 207, HDL 33, LDL 54. A1c 6.9%. CBC and CMET normal. Dated 12/03/19: Hgb 12. A1c 6.7%. otherwise CBC and CMET normal. Iron studies normal. Cholesterol 122, triglycerides 118, HDL 35, LDL 65.  Dated 03/10/20: A1c 5.6%. cholesterol 108,  triglycerides 100, HDL 36, LDL 60. Hgb 11.0. iron level 78. Otherwise CBC and CMET normal.   Cardiac cath 12/12/19:  RIGHT/LEFT HEART CATH AND CORONARY/GRAFT ANGIOGRAPHY  Conclusion    Mid LM lesion is 35% stenosed.  Ost LAD to Mid LAD lesion is 100% stenosed.  Prox Cx lesion is 99% stenosed.  Prox Cx to Mid Cx lesion is 90% stenosed.  1st Mrg lesion is 100% stenosed.  2nd Mrg lesion is 75% stenosed.  Prox RCA to Dist RCA lesion is 100% stenosed.  LIMA graft was visualized by angiography and is normal in caliber.  The graft exhibits no disease.  Dist LAD-1 lesion is 60% stenosed.  Dist LAD-2 lesion is 90% stenosed.  SVG graft was visualized by angiography and is large.  The graft exhibits minimal luminal irregularities.  SVG graft was visualized by angiography and is normal in caliber.  The graft exhibits mild .  The left ventricular systolic function is normal.  LV end diastolic pressure is normal.  The left ventricular ejection fraction is 55-65% by visual estimate.   1. Complex 3 vessel obstructive CAD.  2. Patent LIMA to the LAD. There is severe disease in the distal LAD. The vessel is small in caliber and not suitable for PCI 3. Patent SVG to OM1 4. Patent SVG to RV marginal branch. The RCA is occluded with left to right collaterals from the LCx to the distal RCA 5. Normal LV function 6. Normal LV filling pressures 7. 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 This supplies the second and third OM branches and collaterals to the distal RCA. This is a potential target for intervention. It will require atherectomy. Given progressive symptoms will admit to telemetry. Start IV heparin. Load with Plavix. Planned staged complex PCI of the LCx tomorrow.   Attempted PCI 12/13/19:  TEMPORARY PACEMAKER  INVASIVE LAB ABORTED CASE  Conclusion    Dist  LAD-1 lesion is 60% stenosed.  Prox Cx lesion is 99% stenosed.  Prox Cx to Mid Cx lesion is 90% stenosed.  2nd Mrg lesion is 75% stenosed.  Unsuccessful PCI due to inability to cross with a wire.   1. Unsuccessful PCI of the LCx due to inability to cross the lesion with a wire.   Plan: medical therapy. ASA only. Will increase amlodipine to 5 mg daily. Consider Ranexa if symptoms persists. Anticipate DC tomorrow am.  Assessment / Plan: 1. CAD- s/p CABG 2006. He is asymptomatic.  Grafts are patent by cath in 2015. SVG to OM1, SVG to RV marginal branch and LIMA to LAD. He did have complex severe disease in the LCx and in the mid RCA that were not grafted. Repeat cardiac cath was stable. Attempt was made to open the LCx but we were unable to cross the lesion with a wire despite multiple attempts. The RCA was chronically occluded and not suitable for PCI. Will continue Ranexa and amlodipine  2. Hyperlipidemia. On statin. Excellent control.  3. DM type 2. A1c is improved.   4. HTN - required resumption of amlodipine. Tolerating well.  5. Atrial tachycardia. Noted in hospital. Started on amiodarone. Event  monitor subsequently showed nonsustained episodes.   6. GERD  Follow up 6 months.

## 2020-05-25 DIAGNOSIS — Z1331 Encounter for screening for depression: Secondary | ICD-10-CM | POA: Diagnosis not present

## 2020-05-25 DIAGNOSIS — Z9181 History of falling: Secondary | ICD-10-CM | POA: Diagnosis not present

## 2020-05-25 DIAGNOSIS — Z139 Encounter for screening, unspecified: Secondary | ICD-10-CM | POA: Diagnosis not present

## 2020-05-25 DIAGNOSIS — E785 Hyperlipidemia, unspecified: Secondary | ICD-10-CM | POA: Diagnosis not present

## 2020-05-25 DIAGNOSIS — Z0000 Encounter for general adult medical examination without abnormal findings: Secondary | ICD-10-CM | POA: Diagnosis not present

## 2020-05-27 ENCOUNTER — Ambulatory Visit (INDEPENDENT_AMBULATORY_CARE_PROVIDER_SITE_OTHER): Payer: Medicare PPO | Admitting: Cardiology

## 2020-05-27 ENCOUNTER — Encounter: Payer: Self-pay | Admitting: Cardiology

## 2020-05-27 ENCOUNTER — Other Ambulatory Visit: Payer: Self-pay

## 2020-05-27 VITALS — BP 154/58 | HR 61 | Ht 70.0 in | Wt 179.7 lb

## 2020-05-27 DIAGNOSIS — I2510 Atherosclerotic heart disease of native coronary artery without angina pectoris: Secondary | ICD-10-CM | POA: Diagnosis not present

## 2020-05-27 DIAGNOSIS — I10 Essential (primary) hypertension: Secondary | ICD-10-CM

## 2020-05-27 DIAGNOSIS — I2583 Coronary atherosclerosis due to lipid rich plaque: Secondary | ICD-10-CM

## 2020-05-27 DIAGNOSIS — E785 Hyperlipidemia, unspecified: Secondary | ICD-10-CM | POA: Diagnosis not present

## 2020-05-27 DIAGNOSIS — M6281 Muscle weakness (generalized): Secondary | ICD-10-CM | POA: Diagnosis not present

## 2020-05-27 DIAGNOSIS — R2681 Unsteadiness on feet: Secondary | ICD-10-CM | POA: Diagnosis not present

## 2020-05-27 DIAGNOSIS — I471 Supraventricular tachycardia: Secondary | ICD-10-CM | POA: Diagnosis not present

## 2020-06-03 DIAGNOSIS — M6281 Muscle weakness (generalized): Secondary | ICD-10-CM | POA: Diagnosis not present

## 2020-06-03 DIAGNOSIS — R2681 Unsteadiness on feet: Secondary | ICD-10-CM | POA: Diagnosis not present

## 2020-06-05 DIAGNOSIS — R935 Abnormal findings on diagnostic imaging of other abdominal regions, including retroperitoneum: Secondary | ICD-10-CM | POA: Diagnosis not present

## 2020-06-05 DIAGNOSIS — R059 Cough, unspecified: Secondary | ICD-10-CM | POA: Diagnosis not present

## 2020-06-05 DIAGNOSIS — K219 Gastro-esophageal reflux disease without esophagitis: Secondary | ICD-10-CM | POA: Diagnosis not present

## 2020-06-05 DIAGNOSIS — R1310 Dysphagia, unspecified: Secondary | ICD-10-CM | POA: Diagnosis not present

## 2020-06-05 DIAGNOSIS — M6281 Muscle weakness (generalized): Secondary | ICD-10-CM | POA: Diagnosis not present

## 2020-06-05 DIAGNOSIS — R079 Chest pain, unspecified: Secondary | ICD-10-CM | POA: Diagnosis not present

## 2020-06-05 DIAGNOSIS — R2681 Unsteadiness on feet: Secondary | ICD-10-CM | POA: Diagnosis not present

## 2020-06-05 DIAGNOSIS — Z809 Family history of malignant neoplasm, unspecified: Secondary | ICD-10-CM | POA: Diagnosis not present

## 2020-06-05 DIAGNOSIS — K5790 Diverticulosis of intestine, part unspecified, without perforation or abscess without bleeding: Secondary | ICD-10-CM | POA: Diagnosis not present

## 2020-06-05 DIAGNOSIS — K5900 Constipation, unspecified: Secondary | ICD-10-CM | POA: Diagnosis not present

## 2020-06-05 DIAGNOSIS — Z86010 Personal history of colonic polyps: Secondary | ICD-10-CM | POA: Diagnosis not present

## 2020-06-08 DIAGNOSIS — R2681 Unsteadiness on feet: Secondary | ICD-10-CM | POA: Diagnosis not present

## 2020-06-08 DIAGNOSIS — M6281 Muscle weakness (generalized): Secondary | ICD-10-CM | POA: Diagnosis not present

## 2020-06-09 DIAGNOSIS — I25810 Atherosclerosis of coronary artery bypass graft(s) without angina pectoris: Secondary | ICD-10-CM | POA: Diagnosis not present

## 2020-06-09 DIAGNOSIS — Z6825 Body mass index (BMI) 25.0-25.9, adult: Secondary | ICD-10-CM | POA: Diagnosis not present

## 2020-06-09 DIAGNOSIS — E1140 Type 2 diabetes mellitus with diabetic neuropathy, unspecified: Secondary | ICD-10-CM | POA: Diagnosis not present

## 2020-06-09 DIAGNOSIS — I10 Essential (primary) hypertension: Secondary | ICD-10-CM | POA: Diagnosis not present

## 2020-06-09 DIAGNOSIS — E039 Hypothyroidism, unspecified: Secondary | ICD-10-CM | POA: Diagnosis not present

## 2020-06-09 DIAGNOSIS — D638 Anemia in other chronic diseases classified elsewhere: Secondary | ICD-10-CM | POA: Diagnosis not present

## 2020-06-09 DIAGNOSIS — E785 Hyperlipidemia, unspecified: Secondary | ICD-10-CM | POA: Diagnosis not present

## 2020-06-09 DIAGNOSIS — E1165 Type 2 diabetes mellitus with hyperglycemia: Secondary | ICD-10-CM | POA: Diagnosis not present

## 2020-06-09 DIAGNOSIS — K219 Gastro-esophageal reflux disease without esophagitis: Secondary | ICD-10-CM | POA: Diagnosis not present

## 2020-06-10 DIAGNOSIS — R2681 Unsteadiness on feet: Secondary | ICD-10-CM | POA: Diagnosis not present

## 2020-06-10 DIAGNOSIS — M6281 Muscle weakness (generalized): Secondary | ICD-10-CM | POA: Diagnosis not present

## 2020-06-16 DIAGNOSIS — R2681 Unsteadiness on feet: Secondary | ICD-10-CM | POA: Diagnosis not present

## 2020-06-16 DIAGNOSIS — M6281 Muscle weakness (generalized): Secondary | ICD-10-CM | POA: Diagnosis not present

## 2020-06-18 DIAGNOSIS — M6281 Muscle weakness (generalized): Secondary | ICD-10-CM | POA: Diagnosis not present

## 2020-06-18 DIAGNOSIS — R2681 Unsteadiness on feet: Secondary | ICD-10-CM | POA: Diagnosis not present

## 2020-06-23 DIAGNOSIS — R2681 Unsteadiness on feet: Secondary | ICD-10-CM | POA: Diagnosis not present

## 2020-06-23 DIAGNOSIS — M6281 Muscle weakness (generalized): Secondary | ICD-10-CM | POA: Diagnosis not present

## 2020-06-25 DIAGNOSIS — R2681 Unsteadiness on feet: Secondary | ICD-10-CM | POA: Diagnosis not present

## 2020-06-25 DIAGNOSIS — M6281 Muscle weakness (generalized): Secondary | ICD-10-CM | POA: Diagnosis not present

## 2020-06-30 DIAGNOSIS — R6889 Other general symptoms and signs: Secondary | ICD-10-CM | POA: Diagnosis not present

## 2020-07-07 DIAGNOSIS — M6281 Muscle weakness (generalized): Secondary | ICD-10-CM | POA: Diagnosis not present

## 2020-07-07 DIAGNOSIS — R2681 Unsteadiness on feet: Secondary | ICD-10-CM | POA: Diagnosis not present

## 2020-07-15 DIAGNOSIS — R053 Chronic cough: Secondary | ICD-10-CM | POA: Diagnosis not present

## 2020-07-15 DIAGNOSIS — K219 Gastro-esophageal reflux disease without esophagitis: Secondary | ICD-10-CM | POA: Diagnosis not present

## 2020-07-15 DIAGNOSIS — R059 Cough, unspecified: Secondary | ICD-10-CM | POA: Diagnosis not present

## 2020-07-15 DIAGNOSIS — L853 Xerosis cutis: Secondary | ICD-10-CM | POA: Diagnosis not present

## 2020-07-15 DIAGNOSIS — R918 Other nonspecific abnormal finding of lung field: Secondary | ICD-10-CM | POA: Diagnosis not present

## 2020-07-15 DIAGNOSIS — E039 Hypothyroidism, unspecified: Secondary | ICD-10-CM | POA: Diagnosis not present

## 2020-07-20 ENCOUNTER — Telehealth: Payer: Self-pay | Admitting: Cardiology

## 2020-07-20 NOTE — Telephone Encounter (Signed)
New Message"      Please call, question about his Pacerone.

## 2020-07-20 NOTE — Telephone Encounter (Signed)
Spoke with pt who state for the past month he's been experiencing a dry cough and sometimes a little unsteady on his feet. Pt voiced he believe symptoms are related to amiodarone as he's read the side effects.   BP average: 130-135   HR average: 60's   Will route to MD

## 2020-07-20 NOTE — Telephone Encounter (Signed)
I would tell him to stop amiodarone. He had some tachycardia in June 2021 when admitted for other reasons and started on it then. No tachycardia since then.  Alexander Windsor [name] MD, [facility]

## 2020-07-22 NOTE — Telephone Encounter (Signed)
Spoke to patient on 07/21/20 Dr.Jordan advised to stop taking Amiodarone.Advised to keep appointment as planned,call sooner if needed.

## 2020-07-30 DIAGNOSIS — R58 Hemorrhage, not elsewhere classified: Secondary | ICD-10-CM | POA: Diagnosis not present

## 2020-07-30 DIAGNOSIS — R531 Weakness: Secondary | ICD-10-CM | POA: Diagnosis not present

## 2020-07-30 DIAGNOSIS — J811 Chronic pulmonary edema: Secondary | ICD-10-CM | POA: Diagnosis not present

## 2020-07-30 DIAGNOSIS — E871 Hypo-osmolality and hyponatremia: Secondary | ICD-10-CM | POA: Diagnosis not present

## 2020-07-30 DIAGNOSIS — I2699 Other pulmonary embolism without acute cor pulmonale: Secondary | ICD-10-CM | POA: Diagnosis not present

## 2020-07-30 DIAGNOSIS — K5790 Diverticulosis of intestine, part unspecified, without perforation or abscess without bleeding: Secondary | ICD-10-CM | POA: Diagnosis not present

## 2020-07-30 DIAGNOSIS — Z85820 Personal history of malignant melanoma of skin: Secondary | ICD-10-CM | POA: Diagnosis not present

## 2020-07-30 DIAGNOSIS — I10 Essential (primary) hypertension: Secondary | ICD-10-CM | POA: Diagnosis not present

## 2020-07-30 DIAGNOSIS — K219 Gastro-esophageal reflux disease without esophagitis: Secondary | ICD-10-CM | POA: Diagnosis not present

## 2020-07-30 DIAGNOSIS — Z9049 Acquired absence of other specified parts of digestive tract: Secondary | ICD-10-CM | POA: Diagnosis not present

## 2020-07-30 DIAGNOSIS — R21 Rash and other nonspecific skin eruption: Secondary | ICD-10-CM | POA: Diagnosis not present

## 2020-07-30 DIAGNOSIS — J90 Pleural effusion, not elsewhere classified: Secondary | ICD-10-CM | POA: Diagnosis not present

## 2020-07-30 DIAGNOSIS — I340 Nonrheumatic mitral (valve) insufficiency: Secondary | ICD-10-CM | POA: Diagnosis not present

## 2020-07-30 DIAGNOSIS — N200 Calculus of kidney: Secondary | ICD-10-CM | POA: Diagnosis not present

## 2020-07-30 DIAGNOSIS — D62 Acute posthemorrhagic anemia: Secondary | ICD-10-CM | POA: Diagnosis not present

## 2020-07-30 DIAGNOSIS — R5381 Other malaise: Secondary | ICD-10-CM | POA: Diagnosis not present

## 2020-07-30 DIAGNOSIS — I2694 Multiple subsegmental pulmonary emboli without acute cor pulmonale: Secondary | ICD-10-CM | POA: Diagnosis not present

## 2020-07-30 DIAGNOSIS — M7989 Other specified soft tissue disorders: Secondary | ICD-10-CM | POA: Diagnosis not present

## 2020-07-30 DIAGNOSIS — K5701 Diverticulitis of small intestine with perforation and abscess with bleeding: Secondary | ICD-10-CM | POA: Diagnosis not present

## 2020-07-30 DIAGNOSIS — K922 Gastrointestinal hemorrhage, unspecified: Secondary | ICD-10-CM | POA: Diagnosis not present

## 2020-07-30 DIAGNOSIS — R69 Illness, unspecified: Secondary | ICD-10-CM | POA: Diagnosis not present

## 2020-07-30 DIAGNOSIS — J9811 Atelectasis: Secondary | ICD-10-CM | POA: Diagnosis not present

## 2020-07-30 DIAGNOSIS — Z8719 Personal history of other diseases of the digestive system: Secondary | ICD-10-CM | POA: Diagnosis not present

## 2020-07-30 DIAGNOSIS — K31811 Angiodysplasia of stomach and duodenum with bleeding: Secondary | ICD-10-CM | POA: Diagnosis not present

## 2020-07-30 DIAGNOSIS — I517 Cardiomegaly: Secondary | ICD-10-CM | POA: Diagnosis not present

## 2020-07-30 DIAGNOSIS — I2510 Atherosclerotic heart disease of native coronary artery without angina pectoris: Secondary | ICD-10-CM | POA: Diagnosis not present

## 2020-07-30 DIAGNOSIS — K921 Melena: Secondary | ICD-10-CM | POA: Diagnosis not present

## 2020-07-30 DIAGNOSIS — R0602 Shortness of breath: Secondary | ICD-10-CM | POA: Diagnosis not present

## 2020-07-30 DIAGNOSIS — I9589 Other hypotension: Secondary | ICD-10-CM | POA: Diagnosis not present

## 2020-07-30 DIAGNOSIS — I959 Hypotension, unspecified: Secondary | ICD-10-CM | POA: Diagnosis not present

## 2020-07-30 DIAGNOSIS — J9601 Acute respiratory failure with hypoxia: Secondary | ICD-10-CM | POA: Diagnosis not present

## 2020-07-30 DIAGNOSIS — R0902 Hypoxemia: Secondary | ICD-10-CM | POA: Diagnosis not present

## 2020-07-30 DIAGNOSIS — K5730 Diverticulosis of large intestine without perforation or abscess without bleeding: Secondary | ICD-10-CM | POA: Diagnosis not present

## 2020-07-30 DIAGNOSIS — R9431 Abnormal electrocardiogram [ECG] [EKG]: Secondary | ICD-10-CM | POA: Diagnosis not present

## 2020-07-30 DIAGNOSIS — D509 Iron deficiency anemia, unspecified: Secondary | ICD-10-CM | POA: Diagnosis not present

## 2020-07-30 DIAGNOSIS — D649 Anemia, unspecified: Secondary | ICD-10-CM | POA: Diagnosis not present

## 2020-07-30 DIAGNOSIS — Z7401 Bed confinement status: Secondary | ICD-10-CM | POA: Diagnosis not present

## 2020-07-30 DIAGNOSIS — J1282 Pneumonia due to coronavirus disease 2019: Secondary | ICD-10-CM | POA: Diagnosis not present

## 2020-08-04 DIAGNOSIS — I2510 Atherosclerotic heart disease of native coronary artery without angina pectoris: Secondary | ICD-10-CM | POA: Diagnosis not present

## 2020-08-04 DIAGNOSIS — I10 Essential (primary) hypertension: Secondary | ICD-10-CM | POA: Diagnosis not present

## 2020-08-04 DIAGNOSIS — D509 Iron deficiency anemia, unspecified: Secondary | ICD-10-CM | POA: Diagnosis not present

## 2020-08-06 DIAGNOSIS — I340 Nonrheumatic mitral (valve) insufficiency: Secondary | ICD-10-CM | POA: Diagnosis not present

## 2020-08-21 DIAGNOSIS — E871 Hypo-osmolality and hyponatremia: Secondary | ICD-10-CM | POA: Diagnosis not present

## 2020-08-21 DIAGNOSIS — R5381 Other malaise: Secondary | ICD-10-CM | POA: Diagnosis not present

## 2020-08-21 DIAGNOSIS — R69 Illness, unspecified: Secondary | ICD-10-CM | POA: Diagnosis not present

## 2020-08-21 DIAGNOSIS — J9601 Acute respiratory failure with hypoxia: Secondary | ICD-10-CM | POA: Diagnosis not present

## 2020-08-21 DIAGNOSIS — L89620 Pressure ulcer of left heel, unstageable: Secondary | ICD-10-CM | POA: Diagnosis not present

## 2020-08-21 DIAGNOSIS — K922 Gastrointestinal hemorrhage, unspecified: Secondary | ICD-10-CM | POA: Diagnosis not present

## 2020-08-21 DIAGNOSIS — Z7401 Bed confinement status: Secondary | ICD-10-CM | POA: Diagnosis not present

## 2020-08-21 DIAGNOSIS — R262 Difficulty in walking, not elsewhere classified: Secondary | ICD-10-CM | POA: Diagnosis not present

## 2020-08-21 DIAGNOSIS — I10 Essential (primary) hypertension: Secondary | ICD-10-CM | POA: Diagnosis not present

## 2020-08-21 DIAGNOSIS — D62 Acute posthemorrhagic anemia: Secondary | ICD-10-CM | POA: Diagnosis not present

## 2020-08-21 DIAGNOSIS — K31811 Angiodysplasia of stomach and duodenum with bleeding: Secondary | ICD-10-CM | POA: Diagnosis not present

## 2020-08-21 DIAGNOSIS — I2699 Other pulmonary embolism without acute cor pulmonale: Secondary | ICD-10-CM | POA: Diagnosis not present

## 2020-08-21 DIAGNOSIS — E1151 Type 2 diabetes mellitus with diabetic peripheral angiopathy without gangrene: Secondary | ICD-10-CM | POA: Diagnosis not present

## 2020-08-21 DIAGNOSIS — D649 Anemia, unspecified: Secondary | ICD-10-CM | POA: Diagnosis not present

## 2020-08-22 DIAGNOSIS — D62 Acute posthemorrhagic anemia: Secondary | ICD-10-CM | POA: Diagnosis not present

## 2020-08-22 DIAGNOSIS — K922 Gastrointestinal hemorrhage, unspecified: Secondary | ICD-10-CM | POA: Diagnosis not present

## 2020-08-22 DIAGNOSIS — R262 Difficulty in walking, not elsewhere classified: Secondary | ICD-10-CM | POA: Diagnosis not present

## 2020-08-22 DIAGNOSIS — E1151 Type 2 diabetes mellitus with diabetic peripheral angiopathy without gangrene: Secondary | ICD-10-CM | POA: Diagnosis not present

## 2020-09-01 DIAGNOSIS — L89620 Pressure ulcer of left heel, unstageable: Secondary | ICD-10-CM | POA: Diagnosis not present

## 2020-09-07 DIAGNOSIS — E1140 Type 2 diabetes mellitus with diabetic neuropathy, unspecified: Secondary | ICD-10-CM | POA: Diagnosis not present

## 2020-09-07 DIAGNOSIS — Z6824 Body mass index (BMI) 24.0-24.9, adult: Secondary | ICD-10-CM | POA: Diagnosis not present

## 2020-09-07 DIAGNOSIS — R269 Unspecified abnormalities of gait and mobility: Secondary | ICD-10-CM | POA: Diagnosis not present

## 2020-09-07 DIAGNOSIS — E119 Type 2 diabetes mellitus without complications: Secondary | ICD-10-CM | POA: Diagnosis not present

## 2020-09-07 DIAGNOSIS — M6281 Muscle weakness (generalized): Secondary | ICD-10-CM | POA: Diagnosis not present

## 2020-09-07 DIAGNOSIS — I10 Essential (primary) hypertension: Secondary | ICD-10-CM | POA: Diagnosis not present

## 2020-09-07 DIAGNOSIS — I2510 Atherosclerotic heart disease of native coronary artery without angina pectoris: Secondary | ICD-10-CM | POA: Diagnosis not present

## 2020-09-08 DIAGNOSIS — I2510 Atherosclerotic heart disease of native coronary artery without angina pectoris: Secondary | ICD-10-CM | POA: Diagnosis not present

## 2020-09-08 DIAGNOSIS — E039 Hypothyroidism, unspecified: Secondary | ICD-10-CM | POA: Diagnosis not present

## 2020-09-08 DIAGNOSIS — K922 Gastrointestinal hemorrhage, unspecified: Secondary | ICD-10-CM | POA: Diagnosis not present

## 2020-09-08 DIAGNOSIS — E538 Deficiency of other specified B group vitamins: Secondary | ICD-10-CM | POA: Diagnosis not present

## 2020-09-08 DIAGNOSIS — E1140 Type 2 diabetes mellitus with diabetic neuropathy, unspecified: Secondary | ICD-10-CM | POA: Diagnosis not present

## 2020-09-08 DIAGNOSIS — D62 Acute posthemorrhagic anemia: Secondary | ICD-10-CM | POA: Diagnosis not present

## 2020-09-08 DIAGNOSIS — N400 Enlarged prostate without lower urinary tract symptoms: Secondary | ICD-10-CM | POA: Diagnosis not present

## 2020-09-08 DIAGNOSIS — L89620 Pressure ulcer of left heel, unstageable: Secondary | ICD-10-CM | POA: Diagnosis not present

## 2020-09-08 DIAGNOSIS — I2699 Other pulmonary embolism without acute cor pulmonale: Secondary | ICD-10-CM | POA: Diagnosis not present

## 2020-09-10 DIAGNOSIS — E559 Vitamin D deficiency, unspecified: Secondary | ICD-10-CM | POA: Diagnosis not present

## 2020-09-10 DIAGNOSIS — L89620 Pressure ulcer of left heel, unstageable: Secondary | ICD-10-CM | POA: Diagnosis not present

## 2020-09-10 DIAGNOSIS — E119 Type 2 diabetes mellitus without complications: Secondary | ICD-10-CM | POA: Diagnosis not present

## 2020-09-10 DIAGNOSIS — D62 Acute posthemorrhagic anemia: Secondary | ICD-10-CM | POA: Diagnosis not present

## 2020-09-10 DIAGNOSIS — I10 Essential (primary) hypertension: Secondary | ICD-10-CM | POA: Diagnosis not present

## 2020-09-10 DIAGNOSIS — I2699 Other pulmonary embolism without acute cor pulmonale: Secondary | ICD-10-CM | POA: Diagnosis not present

## 2020-09-10 DIAGNOSIS — I2510 Atherosclerotic heart disease of native coronary artery without angina pectoris: Secondary | ICD-10-CM | POA: Diagnosis not present

## 2020-09-10 DIAGNOSIS — E1140 Type 2 diabetes mellitus with diabetic neuropathy, unspecified: Secondary | ICD-10-CM | POA: Diagnosis not present

## 2020-09-10 DIAGNOSIS — K922 Gastrointestinal hemorrhage, unspecified: Secondary | ICD-10-CM | POA: Diagnosis not present

## 2020-09-10 DIAGNOSIS — E039 Hypothyroidism, unspecified: Secondary | ICD-10-CM | POA: Diagnosis not present

## 2020-09-10 DIAGNOSIS — E785 Hyperlipidemia, unspecified: Secondary | ICD-10-CM | POA: Diagnosis not present

## 2020-09-10 DIAGNOSIS — D510 Vitamin B12 deficiency anemia due to intrinsic factor deficiency: Secondary | ICD-10-CM | POA: Diagnosis not present

## 2020-09-10 DIAGNOSIS — E538 Deficiency of other specified B group vitamins: Secondary | ICD-10-CM | POA: Diagnosis not present

## 2020-09-10 DIAGNOSIS — N400 Enlarged prostate without lower urinary tract symptoms: Secondary | ICD-10-CM | POA: Diagnosis not present

## 2020-09-10 DIAGNOSIS — D649 Anemia, unspecified: Secondary | ICD-10-CM | POA: Diagnosis not present

## 2020-09-11 DIAGNOSIS — E785 Hyperlipidemia, unspecified: Secondary | ICD-10-CM | POA: Diagnosis not present

## 2020-09-11 DIAGNOSIS — D649 Anemia, unspecified: Secondary | ICD-10-CM | POA: Diagnosis not present

## 2020-09-11 DIAGNOSIS — R54 Age-related physical debility: Secondary | ICD-10-CM | POA: Diagnosis not present

## 2020-09-11 DIAGNOSIS — E559 Vitamin D deficiency, unspecified: Secondary | ICD-10-CM | POA: Diagnosis not present

## 2020-09-11 DIAGNOSIS — R799 Abnormal finding of blood chemistry, unspecified: Secondary | ICD-10-CM | POA: Diagnosis not present

## 2020-09-14 DIAGNOSIS — E11621 Type 2 diabetes mellitus with foot ulcer: Secondary | ICD-10-CM | POA: Diagnosis not present

## 2020-09-14 DIAGNOSIS — L89620 Pressure ulcer of left heel, unstageable: Secondary | ICD-10-CM | POA: Diagnosis not present

## 2020-09-14 DIAGNOSIS — R269 Unspecified abnormalities of gait and mobility: Secondary | ICD-10-CM | POA: Diagnosis not present

## 2020-09-14 DIAGNOSIS — K922 Gastrointestinal hemorrhage, unspecified: Secondary | ICD-10-CM | POA: Diagnosis not present

## 2020-09-14 DIAGNOSIS — M79672 Pain in left foot: Secondary | ICD-10-CM | POA: Diagnosis not present

## 2020-09-14 DIAGNOSIS — L97429 Non-pressure chronic ulcer of left heel and midfoot with unspecified severity: Secondary | ICD-10-CM | POA: Diagnosis not present

## 2020-09-14 DIAGNOSIS — I2699 Other pulmonary embolism without acute cor pulmonale: Secondary | ICD-10-CM | POA: Diagnosis not present

## 2020-09-14 DIAGNOSIS — E1140 Type 2 diabetes mellitus with diabetic neuropathy, unspecified: Secondary | ICD-10-CM | POA: Diagnosis not present

## 2020-09-14 DIAGNOSIS — I2510 Atherosclerotic heart disease of native coronary artery without angina pectoris: Secondary | ICD-10-CM | POA: Diagnosis not present

## 2020-09-14 DIAGNOSIS — R54 Age-related physical debility: Secondary | ICD-10-CM | POA: Diagnosis not present

## 2020-09-14 DIAGNOSIS — M6281 Muscle weakness (generalized): Secondary | ICD-10-CM | POA: Diagnosis not present

## 2020-09-14 DIAGNOSIS — E538 Deficiency of other specified B group vitamins: Secondary | ICD-10-CM | POA: Diagnosis not present

## 2020-09-14 DIAGNOSIS — E039 Hypothyroidism, unspecified: Secondary | ICD-10-CM | POA: Diagnosis not present

## 2020-09-14 DIAGNOSIS — N400 Enlarged prostate without lower urinary tract symptoms: Secondary | ICD-10-CM | POA: Diagnosis not present

## 2020-09-14 DIAGNOSIS — D62 Acute posthemorrhagic anemia: Secondary | ICD-10-CM | POA: Diagnosis not present

## 2020-09-17 DIAGNOSIS — I2699 Other pulmonary embolism without acute cor pulmonale: Secondary | ICD-10-CM | POA: Diagnosis not present

## 2020-09-17 DIAGNOSIS — L89620 Pressure ulcer of left heel, unstageable: Secondary | ICD-10-CM | POA: Diagnosis not present

## 2020-09-17 DIAGNOSIS — I2510 Atherosclerotic heart disease of native coronary artery without angina pectoris: Secondary | ICD-10-CM | POA: Diagnosis not present

## 2020-09-17 DIAGNOSIS — D62 Acute posthemorrhagic anemia: Secondary | ICD-10-CM | POA: Diagnosis not present

## 2020-09-17 DIAGNOSIS — N400 Enlarged prostate without lower urinary tract symptoms: Secondary | ICD-10-CM | POA: Diagnosis not present

## 2020-09-17 DIAGNOSIS — E1140 Type 2 diabetes mellitus with diabetic neuropathy, unspecified: Secondary | ICD-10-CM | POA: Diagnosis not present

## 2020-09-17 DIAGNOSIS — E039 Hypothyroidism, unspecified: Secondary | ICD-10-CM | POA: Diagnosis not present

## 2020-09-17 DIAGNOSIS — E538 Deficiency of other specified B group vitamins: Secondary | ICD-10-CM | POA: Diagnosis not present

## 2020-09-17 DIAGNOSIS — K922 Gastrointestinal hemorrhage, unspecified: Secondary | ICD-10-CM | POA: Diagnosis not present

## 2020-09-18 DIAGNOSIS — R54 Age-related physical debility: Secondary | ICD-10-CM | POA: Diagnosis not present

## 2020-09-18 DIAGNOSIS — D649 Anemia, unspecified: Secondary | ICD-10-CM | POA: Diagnosis not present

## 2020-09-18 DIAGNOSIS — K921 Melena: Secondary | ICD-10-CM | POA: Diagnosis not present

## 2020-09-18 DIAGNOSIS — L989 Disorder of the skin and subcutaneous tissue, unspecified: Secondary | ICD-10-CM | POA: Diagnosis not present

## 2020-09-22 DIAGNOSIS — L89620 Pressure ulcer of left heel, unstageable: Secondary | ICD-10-CM | POA: Diagnosis not present

## 2020-09-22 DIAGNOSIS — I2510 Atherosclerotic heart disease of native coronary artery without angina pectoris: Secondary | ICD-10-CM | POA: Diagnosis not present

## 2020-09-22 DIAGNOSIS — D62 Acute posthemorrhagic anemia: Secondary | ICD-10-CM | POA: Diagnosis not present

## 2020-09-22 DIAGNOSIS — I2699 Other pulmonary embolism without acute cor pulmonale: Secondary | ICD-10-CM | POA: Diagnosis not present

## 2020-09-22 DIAGNOSIS — N400 Enlarged prostate without lower urinary tract symptoms: Secondary | ICD-10-CM | POA: Diagnosis not present

## 2020-09-22 DIAGNOSIS — E538 Deficiency of other specified B group vitamins: Secondary | ICD-10-CM | POA: Diagnosis not present

## 2020-09-22 DIAGNOSIS — E039 Hypothyroidism, unspecified: Secondary | ICD-10-CM | POA: Diagnosis not present

## 2020-09-22 DIAGNOSIS — K922 Gastrointestinal hemorrhage, unspecified: Secondary | ICD-10-CM | POA: Diagnosis not present

## 2020-09-22 DIAGNOSIS — E1140 Type 2 diabetes mellitus with diabetic neuropathy, unspecified: Secondary | ICD-10-CM | POA: Diagnosis not present

## 2020-09-23 DIAGNOSIS — L97421 Non-pressure chronic ulcer of left heel and midfoot limited to breakdown of skin: Secondary | ICD-10-CM | POA: Diagnosis not present

## 2020-09-23 DIAGNOSIS — I70211 Atherosclerosis of native arteries of extremities with intermittent claudication, right leg: Secondary | ICD-10-CM | POA: Diagnosis not present

## 2020-09-23 DIAGNOSIS — I70244 Atherosclerosis of native arteries of left leg with ulceration of heel and midfoot: Secondary | ICD-10-CM | POA: Diagnosis not present

## 2020-09-25 DIAGNOSIS — M6281 Muscle weakness (generalized): Secondary | ICD-10-CM | POA: Diagnosis not present

## 2020-09-25 DIAGNOSIS — R269 Unspecified abnormalities of gait and mobility: Secondary | ICD-10-CM | POA: Diagnosis not present

## 2020-09-25 DIAGNOSIS — I739 Peripheral vascular disease, unspecified: Secondary | ICD-10-CM | POA: Diagnosis not present

## 2020-09-25 DIAGNOSIS — I70213 Atherosclerosis of native arteries of extremities with intermittent claudication, bilateral legs: Secondary | ICD-10-CM | POA: Diagnosis not present

## 2020-09-25 DIAGNOSIS — S91302S Unspecified open wound, left foot, sequela: Secondary | ICD-10-CM | POA: Diagnosis not present

## 2020-09-25 DIAGNOSIS — R54 Age-related physical debility: Secondary | ICD-10-CM | POA: Diagnosis not present

## 2020-09-25 NOTE — Progress Notes (Signed)
Alexander Choi Date of Birth: 1932/10/02 Medical Record #[id number]  History of Present Illness: Alexander Choi is seen for follow up CAD. He has known CAD with prior CABG in 10-09-2004, negative Myoview in October 2015 showed inferior ischemia. Cardiac cath showed all grafts patent. There was severe disease in the native LCx that predated his CABG. Other issues include DM, HTN and HLD.   On follow up today he is doing well from a cardiac standpoint.  He denies any dyspnea or chest pain. He is taking Tagamet for some heartburn. His wife passed away in Oct 09, 2017. He was admitted to [facility] in October 2020 with bowel obstruction. He had surgery with lysis of adhesions. Was in hospital for 16 days. One week later he fell in his garage and reinjured his shoulder. Had shoulder surgery in December.   He was seen in the ED in October 10, 2022 with dyspnea and symptoms of severe indigestion. He has been aggressively treated for indigestion with Tagamet and Protonix without improvement. Notes he had EGD 2 months ago that showed only mild inflammation. States symptoms are worse after eating a meal but also occur with activity. SOB is definitely worse with activity. States he can only swing a gold club 5-6 times before he has to stop. Doesn't take Ntg because it makes him sick. Notes some discomfort in his left arm but has also had carpel tunnel and shoulder surgery done this past year.  Because of this we performed cardiac cath. Right heart pressures were normal. His angiogram was stable. We did attempt to open the chronically occluded LCx but were unsuccessful due to inability to cross the lesion with a wire. In retrospect this vessel was really unchanged dating back before his CABG.  The patient was seen in the ER 01/09/20 for chest pain. He reported it was left-sided and pressure like in nature. No radiation. Also had more belching. Sob for the last 2 months. CP not worse on exertion. Could be worse after eating. No LLe,  orthopnea, N/V fever. Aspirin given at home and pain improved. BP there was 155/59. Patient ruled otu for ACS/PE.  CP resolved after GI cocktail.   Patient reports he was admitted at [facility] in February with internal bleeding. Had EGD. Placed on iron. States everything looked good and he had colonoscopy about a year ago. This left him very weak and he has been in Rehab facility for PT. Planning to go back home next week. Reports Hgb was down to 6 but reports it was up to 13 last week. Records are not available. He states he had dopplers of his legs and the circulation is bad L>R. He doesn't know what medication he is taking now.    Current Outpatient Medications  Medication Sig Dispense Refill  . gabapentin (NEURONTIN) 100 MG capsule Take 100 mg by mouth 2 (two) times daily. Pt takes 1 tablet three times daily    . Accu-Chek Softclix Lancets lancets     . Accu-Chek Softclix Lancets lancets     . amLODipine (NORVASC) 5 MG tablet Take 5 mg by mouth at bedtime.    Marland Kitchen aspirin 81 MG tablet Take 81 mg by mouth daily.    Marland Kitchen atorvastatin (LIPITOR) 80 MG tablet Take 1 tablet (80 mg total) by mouth daily. 90 tablet 3  . cimetidine (TAGAMET) 400 MG tablet Take 400 mg by mouth daily as needed (heartburn).     Marland Kitchen glimepiride (AMARYL) 4 MG tablet Take 4 mg by mouth every  morning.    Marland Kitchen glucose blood (ACCU-CHEK AVIVA PLUS) test strip     . losartan (COZAAR) 100 MG tablet Take 100 mg by mouth daily.     . metFORMIN (GLUCOPHAGE) 500 MG tablet Take 1 tablet (500 mg total) by mouth 2 (two) times daily with a meal. (Patient taking differently: Take 500 mg by mouth 2 (two) times daily with a meal. Pt takes 1 tablet daily.)    . Multiple Vitamins-Minerals (ICAPS AREDS 2 PO) Take 1 Cartridge by mouth in the morning and at bedtime.    . nitroGLYCERIN (NITROSTAT) 0.4 MG SL tablet Place 1 tablet (0.4 mg total) under the tongue every 5 (five) minutes as needed for chest pain. 90 tablet 3  . NONFORMULARY OR  COMPOUNDED ITEM [facility]:  Peripheral Neuropathy Cream - Bupivacaine 1%, doxepin 3%, Gabapentin 6%, Pentoxifylline 3%, Topiramate 1%, apply 1-2 grams to affected area 3-4 times daily. (Patient taking differently: Apply 1 application topically once a week. [facility]:  Peripheral Neuropathy Cream - Bupivacaine 1%, doxepin 3%, Gabapentin 6%, Pentoxifylline 3%, Topiramate 1%, apply 1-2 grams to affected area 3-4 times daily.) 120 each 3  . OVER THE COUNTER MEDICATION Take 2 capsules by mouth daily. Nerverenew Neuropathy Support Formula (B1 & B12 Vitamins)    . pantoprazole (PROTONIX) 40 MG tablet Take 1 tablet (40 mg total) by mouth daily. (Patient taking differently: Take 40 mg by mouth 2 (two) times daily. ) 30 tablet 0  . ranolazine (RANEXA) 500 MG 12 hr tablet Take 1 tablet (500 mg total) by mouth 2 (two) times daily. 180 tablet 3   Current Facility-Administered Medications  Medication Dose Route Frequency Provider Last Rate Last Admin  . sodium chloride flush (NS) 0.9 % injection 3 mL  3 mL Intravenous Q12H [name], Makiyla Linch M, MD        Allergies  Allergen Reactions  . Ace Inhibitors Other (See Comments)    unknown  . Nitrates, Organic Swelling    Swelling in hands  . Shellfish Allergy Nausea Only    Pt states scallops-not all shellfish-GI symptoms/ nausea -50+ years ago  . Penicillins Rash    Past Medical History:  Diagnosis Date  . Cancer ([place])   . Chronic kidney disease    kidney stones  . Coronary artery disease    with CABG in 2006 with LIMA to LAD, SVG to mid and distal OM, SVG to AM. Normal Myoview in May of 2013  . Diabetes mellitus    Type 2  . GERD (gastroesophageal reflux disease)   . Hematuria   . Hypercholesterolemia   . Hypertension   . Irritable bowel syndrome   . Loose stools   . Prostate cancer ([place])   . Urinary obstruction     Past Surgical History:  Procedure Laterality Date  . CHOLECYSTECTOMY    . CORONARY ARTERY BYPASS GRAFT  2006  .  LEFT HEART CATHETERIZATION WITH CORONARY/GRAFT ANGIOGRAM N/A 04/11/2014   Procedure: LEFT HEART CATHETERIZATION WITH [name];  Surgeon: Kingslee Mairena M [name], MD;  Location: [facility] CATH LAB;  Service: Cardiovascular;  Laterality: N/A;  . OTHER SURGICAL HISTORY     bowel obstruction  . RADIOACTIVE SEED IMPLANT     for prostate cancer  . RIGHT/LEFT HEART CATH AND CORONARY/GRAFT ANGIOGRAPHY N/A 12/12/2019   Procedure: RIGHT/LEFT HEART CATH AND CORONARY/GRAFT ANGIOGRAPHY;  Surgeon: [name], Milan Clare M, MD;  Location: [place] [facility] CV LAB;  Service: Cardiovascular;  Laterality: N/A;  . SKIN CANCER EXCISION    .  TEMPORARY PACEMAKER N/A 12/13/2019   Procedure: TEMPORARY PACEMAKER;  Surgeon: [name], Linsay Vogt M, MD;  Location: [place] [facility] CV LAB;  Service: Cardiovascular;  Laterality: N/A;    Social History   Tobacco Use  Smoking Status Former Smoker  . Packs/day: 3.00  Smokeless Tobacco Never Used    Social History   Substance and Sexual Activity  Alcohol Use No    Family History  Problem Relation Age of Onset  . Cancer Mother        Leukemia  . Hypertension Mother   . Cancer Father        Colon  . Heart attack Father        Had Several in his 62's  . Hypertension Father   . Diabetes Father     Review of Systems: The review of systems is per the HPI.  All other systems were reviewed and are negative.  Physical Exam: BP (!) 142/70   Pulse 86   Ht 5\' 10"  (1.778 m)   Wt 159 lb (72.1 kg)   SpO2 98%   BMI 22.81 kg/m  GENERAL:  Well appearing elderly WM HEENT:  PERRL, EOMI, sclera are clear. Oropharynx is clear. NECK:  No jugular venous distention, carotid upstroke brisk and symmetric, no bruits, no thyromegaly or adenopathy LUNGS:  clear CHEST:  Unremarkable HEART:  RRR,  PMI not displaced or sustained,S1 and S2 within normal limits, no S3, no S4: no clicks, no rubs, no murmurs ABD:  Soft, nontender. BS +, no masses or bruits. No hepatomegaly, no splenomegaly EXT:  No edema.   Brace on left ankle.  SKIN:  Warm and dry.  No rashes NEURO:  Alert and oriented x 3. Cranial nerves II through XII intact. PSYCH:  Cognitively intact   Wt Readings from Last 3 Encounters:  09/29/20 159 lb (72.1 kg)  05/27/20 179 lb 11.2 oz (81.5 kg)  03/06/20 176 lb 9.6 oz (80.1 kg)    LABORATORY DATA/PROCEDURES:  Lab Results  Component Value Date   WBC 7.3 01/08/2020   HGB 12.1 (L) 01/08/2020   HCT 36.6 (L) 01/08/2020   PLT 230 01/08/2020   GLUCOSE 104 (H) 01/08/2020   CHOL 120 12/16/2019   TRIG 125 12/16/2019   HDL 35 (L) 12/16/2019   LDLCALC 60 12/16/2019   ALT 14 12/16/2019   AST 20 12/16/2019   NA 135 01/08/2020   K 4.2 01/08/2020   CL 97 (L) 01/08/2020   CREATININE 0.71 01/08/2020   BUN 16 01/08/2020   CO2 26 01/08/2020   TSH 6.725 (H) 12/15/2019   INR 0.96 04/08/2014   HGBA1C 6.8 (H) 12/12/2019    BNP (last 3 results) No results for input(s): PROBNP in the last 8760 hours.  Labs reviewed from 01/04/16: cholesterol 123, triglycerides 151, HDL 36, LDL 57. CBC and CMET normal.  Dated 07/08/16: cholesterol 116, triglycerides 205, HDL 30, LDL 45. CMET and CBC normal. Dated 01/18/17: Hgb 12.2. CMET normal. Cholesterol 105, triglycerides 152, HDL 28, LDL 47. A1c 6.3%. Dated 07/25/17: Hgb 12.5. Glucose 124. Other chemistries normal. Cholesterol 107, Triglycerides 139, HDL 29, LDL 50. A1c 7.2%. Iron studies normal.  Dated 5.1.19: cholesterol 107, triglycerides 109, HDL 29, LDL 56. A1c 7.5%. CBC and chemistries normal.  Dated 05/29/19: cholesterol 142, triglycerides 207, HDL 33, LDL 54. A1c 6.9%. CBC and CMET normal. Dated 12/03/19: Hgb 12. A1c 6.7%. otherwise CBC and CMET normal. Iron studies normal. Cholesterol 122, triglycerides 118, HDL 35, LDL 65.  Dated 03/10/20: A1c 5.6%. cholesterol  108, triglycerides 100, HDL 36, LDL 60. Hgb 11.0. iron level 78. Otherwise CBC and CMET normal.  Dated 06/09/20: A1c 6.4%. cholesterol 124, triglycerides 111, HDL 42, LDL 62. Hgb 12.  Otherwise CBC, CMET normal. Dated 07/15/20: TSH 17.5  Cardiac cath 12/12/19:  RIGHT/LEFT HEART CATH AND CORONARY/GRAFT ANGIOGRAPHY  Conclusion    Mid LM lesion is 35% stenosed.  Ost LAD to Mid LAD lesion is 100% stenosed.  Prox Cx lesion is 99% stenosed.  Prox Cx to Mid Cx lesion is 90% stenosed.  1st Mrg lesion is 100% stenosed.  2nd Mrg lesion is 75% stenosed.  Prox RCA to Dist RCA lesion is 100% stenosed.  LIMA graft was visualized by angiography and is normal in caliber.  The graft exhibits no disease.  Dist LAD-1 lesion is 60% stenosed.  Dist LAD-2 lesion is 90% stenosed.  SVG graft was visualized by angiography and is large.  The graft exhibits minimal luminal irregularities.  SVG graft was visualized by angiography and is normal in caliber.  The graft exhibits mild .  The left ventricular systolic function is normal.  LV end diastolic pressure is normal.  The left ventricular ejection fraction is 55-65% by visual estimate.   1. Complex 3 vessel obstructive CAD.  2. Patent LIMA to the LAD. There is severe disease in the distal LAD. The vessel is small in caliber and not suitable for PCI 3. Patent SVG to OM1 4. Patent SVG to RV marginal branch. The RCA is occluded with left to right collaterals from the LCx to the distal RCA 5. Normal LV function 6. Normal LV filling pressures 7. 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 This supplies the second and third OM branches and collaterals to the distal RCA. This is a potential target for intervention. It will require atherectomy. Given progressive symptoms will admit to telemetry. Start IV heparin. Load with Plavix. Planned staged complex PCI of the LCx tomorrow.   Attempted PCI 12/13/19:  TEMPORARY PACEMAKER  INVASIVE LAB ABORTED CASE  Conclusion    Dist LAD-1 lesion is 60% stenosed.  Prox Cx  lesion is 99% stenosed.  Prox Cx to Mid Cx lesion is 90% stenosed.  2nd Mrg lesion is 75% stenosed.  Unsuccessful PCI due to inability to cross with a wire.   1. Unsuccessful PCI of the LCx due to inability to cross the lesion with a wire.   Plan: medical therapy. ASA only. Will increase amlodipine to 5 mg daily. Consider Ranexa if symptoms persists. Anticipate DC tomorrow am.  Assessment / Plan: 1. CAD- s/p CABG 2006. He is asymptomatic.  Grafts are patent by cath in 2015. SVG to OM1, SVG to RV marginal branch and LIMA to LAD. He did have complex severe disease in the LCx and in the mid RCA that were not grafted. Repeat cardiac in June 2021 cath was stable. Attempt was made to open the LCx but we were unable to cross the lesion with a wire despite multiple attempts. The RCA was chronically occluded and not suitable for PCI. Will continue Ranexa and amlodipine  2. Hyperlipidemia. On statin.   3. DM type 2. Per primary team   4. HTN - well controlled today  5. Atrial tachycardia. Noted in hospital previously. No symptoms.  6. GERD  7. PAD? Will request copy of dopplers. He does have an ulcer on his heel. May need referral to Dr  Berry.   Follow up 6 months.

## 2020-09-28 DIAGNOSIS — R2689 Other abnormalities of gait and mobility: Secondary | ICD-10-CM | POA: Diagnosis not present

## 2020-09-28 DIAGNOSIS — N400 Enlarged prostate without lower urinary tract symptoms: Secondary | ICD-10-CM | POA: Diagnosis not present

## 2020-09-28 DIAGNOSIS — E1140 Type 2 diabetes mellitus with diabetic neuropathy, unspecified: Secondary | ICD-10-CM | POA: Diagnosis not present

## 2020-09-28 DIAGNOSIS — K921 Melena: Secondary | ICD-10-CM | POA: Diagnosis not present

## 2020-09-28 DIAGNOSIS — E538 Deficiency of other specified B group vitamins: Secondary | ICD-10-CM | POA: Diagnosis not present

## 2020-09-28 DIAGNOSIS — M6281 Muscle weakness (generalized): Secondary | ICD-10-CM | POA: Diagnosis not present

## 2020-09-28 DIAGNOSIS — K5701 Diverticulitis of small intestine with perforation and abscess with bleeding: Secondary | ICD-10-CM | POA: Diagnosis not present

## 2020-09-28 DIAGNOSIS — L89620 Pressure ulcer of left heel, unstageable: Secondary | ICD-10-CM | POA: Diagnosis not present

## 2020-09-28 DIAGNOSIS — I2699 Other pulmonary embolism without acute cor pulmonale: Secondary | ICD-10-CM | POA: Diagnosis not present

## 2020-09-28 DIAGNOSIS — R54 Age-related physical debility: Secondary | ICD-10-CM | POA: Diagnosis not present

## 2020-09-28 DIAGNOSIS — I2510 Atherosclerotic heart disease of native coronary artery without angina pectoris: Secondary | ICD-10-CM | POA: Diagnosis not present

## 2020-09-28 DIAGNOSIS — R269 Unspecified abnormalities of gait and mobility: Secondary | ICD-10-CM | POA: Diagnosis not present

## 2020-09-28 DIAGNOSIS — E039 Hypothyroidism, unspecified: Secondary | ICD-10-CM | POA: Diagnosis not present

## 2020-09-28 DIAGNOSIS — D62 Acute posthemorrhagic anemia: Secondary | ICD-10-CM | POA: Diagnosis not present

## 2020-09-28 DIAGNOSIS — D649 Anemia, unspecified: Secondary | ICD-10-CM | POA: Diagnosis not present

## 2020-09-28 DIAGNOSIS — K922 Gastrointestinal hemorrhage, unspecified: Secondary | ICD-10-CM | POA: Diagnosis not present

## 2020-09-29 ENCOUNTER — Other Ambulatory Visit: Payer: Self-pay

## 2020-09-29 ENCOUNTER — Ambulatory Visit: Payer: Medicare PPO | Admitting: Cardiology

## 2020-09-29 ENCOUNTER — Encounter: Payer: Self-pay | Admitting: Cardiology

## 2020-09-29 VITALS — BP 142/70 | HR 86 | Ht 70.0 in | Wt 159.0 lb

## 2020-09-29 DIAGNOSIS — K922 Gastrointestinal hemorrhage, unspecified: Secondary | ICD-10-CM | POA: Diagnosis not present

## 2020-09-29 DIAGNOSIS — I10 Essential (primary) hypertension: Secondary | ICD-10-CM | POA: Diagnosis not present

## 2020-09-29 DIAGNOSIS — E538 Deficiency of other specified B group vitamins: Secondary | ICD-10-CM | POA: Diagnosis not present

## 2020-09-29 DIAGNOSIS — E1140 Type 2 diabetes mellitus with diabetic neuropathy, unspecified: Secondary | ICD-10-CM | POA: Diagnosis not present

## 2020-09-29 DIAGNOSIS — E039 Hypothyroidism, unspecified: Secondary | ICD-10-CM | POA: Diagnosis not present

## 2020-09-29 DIAGNOSIS — D62 Acute posthemorrhagic anemia: Secondary | ICD-10-CM | POA: Diagnosis not present

## 2020-09-29 DIAGNOSIS — I471 Supraventricular tachycardia: Secondary | ICD-10-CM

## 2020-09-29 DIAGNOSIS — E785 Hyperlipidemia, unspecified: Secondary | ICD-10-CM

## 2020-09-29 DIAGNOSIS — I2510 Atherosclerotic heart disease of native coronary artery without angina pectoris: Secondary | ICD-10-CM

## 2020-09-29 DIAGNOSIS — I2699 Other pulmonary embolism without acute cor pulmonale: Secondary | ICD-10-CM | POA: Diagnosis not present

## 2020-09-29 DIAGNOSIS — I2583 Coronary atherosclerosis due to lipid rich plaque: Secondary | ICD-10-CM

## 2020-09-29 DIAGNOSIS — N400 Enlarged prostate without lower urinary tract symptoms: Secondary | ICD-10-CM | POA: Diagnosis not present

## 2020-09-29 DIAGNOSIS — L89620 Pressure ulcer of left heel, unstageable: Secondary | ICD-10-CM | POA: Diagnosis not present

## 2020-09-30 ENCOUNTER — Telehealth: Payer: Self-pay

## 2020-09-30 DIAGNOSIS — I25709 Atherosclerosis of coronary artery bypass graft(s), unspecified, with unspecified angina pectoris: Secondary | ICD-10-CM

## 2020-09-30 DIAGNOSIS — E785 Hyperlipidemia, unspecified: Secondary | ICD-10-CM

## 2020-09-30 NOTE — Telephone Encounter (Signed)
Attempted to call pt to set up appointment with Dr. Gwenlyn Found. Pt's VM is full, unable to leave message.

## 2020-09-30 NOTE — Telephone Encounter (Signed)
Spoke with Alexander Choi on the phone. Pt is agreeable to have ultrasounds of legs redone and to making an office visit with Dr. Gwenlyn Found. Appointment with Dr. Gwenlyn Found made for 10/16/20 at 11am. Explained to pt that scheduler will call to set up ultrasound appointment. Pt verbalizes understanding.  Orders placed for aorta/IVC/Iliac, and LEA ultrasound with ABIs.

## 2020-10-01 ENCOUNTER — Telehealth: Payer: Self-pay | Admitting: Cardiovascular Disease

## 2020-10-01 DIAGNOSIS — M6281 Muscle weakness (generalized): Secondary | ICD-10-CM | POA: Diagnosis not present

## 2020-10-01 DIAGNOSIS — N400 Enlarged prostate without lower urinary tract symptoms: Secondary | ICD-10-CM | POA: Diagnosis not present

## 2020-10-01 DIAGNOSIS — E039 Hypothyroidism, unspecified: Secondary | ICD-10-CM | POA: Diagnosis not present

## 2020-10-01 DIAGNOSIS — R269 Unspecified abnormalities of gait and mobility: Secondary | ICD-10-CM | POA: Diagnosis not present

## 2020-10-01 DIAGNOSIS — R54 Age-related physical debility: Secondary | ICD-10-CM | POA: Diagnosis not present

## 2020-10-01 DIAGNOSIS — L89620 Pressure ulcer of left heel, unstageable: Secondary | ICD-10-CM | POA: Diagnosis not present

## 2020-10-01 DIAGNOSIS — E538 Deficiency of other specified B group vitamins: Secondary | ICD-10-CM | POA: Diagnosis not present

## 2020-10-01 DIAGNOSIS — I2699 Other pulmonary embolism without acute cor pulmonale: Secondary | ICD-10-CM | POA: Diagnosis not present

## 2020-10-01 DIAGNOSIS — E1140 Type 2 diabetes mellitus with diabetic neuropathy, unspecified: Secondary | ICD-10-CM | POA: Diagnosis not present

## 2020-10-01 DIAGNOSIS — K922 Gastrointestinal hemorrhage, unspecified: Secondary | ICD-10-CM | POA: Diagnosis not present

## 2020-10-01 DIAGNOSIS — L97429 Non-pressure chronic ulcer of left heel and midfoot with unspecified severity: Secondary | ICD-10-CM | POA: Diagnosis not present

## 2020-10-01 DIAGNOSIS — I2510 Atherosclerotic heart disease of native coronary artery without angina pectoris: Secondary | ICD-10-CM | POA: Diagnosis not present

## 2020-10-01 DIAGNOSIS — D62 Acute posthemorrhagic anemia: Secondary | ICD-10-CM | POA: Diagnosis not present

## 2020-10-01 NOTE — Telephone Encounter (Signed)
Spoke with patient regarding Tuesday 10/13/20 Aorta/iliac-lower arterial and abi  At 9:00am---arrival time is 8:45 am for check in.  Patient given address and phone number.  He voiced his understanding.

## 2020-10-01 NOTE — Addendum Note (Signed)
Addended by: Kathyrn Lass on: 10/01/2020 03:17 PM   Modules accepted: Orders

## 2020-10-02 DIAGNOSIS — E039 Hypothyroidism, unspecified: Secondary | ICD-10-CM | POA: Diagnosis not present

## 2020-10-02 DIAGNOSIS — I2510 Atherosclerotic heart disease of native coronary artery without angina pectoris: Secondary | ICD-10-CM | POA: Diagnosis not present

## 2020-10-02 DIAGNOSIS — N400 Enlarged prostate without lower urinary tract symptoms: Secondary | ICD-10-CM | POA: Diagnosis not present

## 2020-10-02 DIAGNOSIS — D62 Acute posthemorrhagic anemia: Secondary | ICD-10-CM | POA: Diagnosis not present

## 2020-10-02 DIAGNOSIS — I2699 Other pulmonary embolism without acute cor pulmonale: Secondary | ICD-10-CM | POA: Diagnosis not present

## 2020-10-02 DIAGNOSIS — L89620 Pressure ulcer of left heel, unstageable: Secondary | ICD-10-CM | POA: Diagnosis not present

## 2020-10-02 DIAGNOSIS — E1140 Type 2 diabetes mellitus with diabetic neuropathy, unspecified: Secondary | ICD-10-CM | POA: Diagnosis not present

## 2020-10-02 DIAGNOSIS — E538 Deficiency of other specified B group vitamins: Secondary | ICD-10-CM | POA: Diagnosis not present

## 2020-10-02 DIAGNOSIS — K922 Gastrointestinal hemorrhage, unspecified: Secondary | ICD-10-CM | POA: Diagnosis not present

## 2020-10-04 DIAGNOSIS — E538 Deficiency of other specified B group vitamins: Secondary | ICD-10-CM | POA: Diagnosis not present

## 2020-10-04 DIAGNOSIS — E1140 Type 2 diabetes mellitus with diabetic neuropathy, unspecified: Secondary | ICD-10-CM | POA: Diagnosis not present

## 2020-10-04 DIAGNOSIS — K922 Gastrointestinal hemorrhage, unspecified: Secondary | ICD-10-CM | POA: Diagnosis not present

## 2020-10-04 DIAGNOSIS — L89620 Pressure ulcer of left heel, unstageable: Secondary | ICD-10-CM | POA: Diagnosis not present

## 2020-10-04 DIAGNOSIS — E039 Hypothyroidism, unspecified: Secondary | ICD-10-CM | POA: Diagnosis not present

## 2020-10-04 DIAGNOSIS — N400 Enlarged prostate without lower urinary tract symptoms: Secondary | ICD-10-CM | POA: Diagnosis not present

## 2020-10-04 DIAGNOSIS — I2699 Other pulmonary embolism without acute cor pulmonale: Secondary | ICD-10-CM | POA: Diagnosis not present

## 2020-10-04 DIAGNOSIS — I2510 Atherosclerotic heart disease of native coronary artery without angina pectoris: Secondary | ICD-10-CM | POA: Diagnosis not present

## 2020-10-04 DIAGNOSIS — D62 Acute posthemorrhagic anemia: Secondary | ICD-10-CM | POA: Diagnosis not present

## 2020-10-06 DIAGNOSIS — E039 Hypothyroidism, unspecified: Secondary | ICD-10-CM | POA: Diagnosis not present

## 2020-10-06 DIAGNOSIS — I2510 Atherosclerotic heart disease of native coronary artery without angina pectoris: Secondary | ICD-10-CM | POA: Diagnosis not present

## 2020-10-06 DIAGNOSIS — D62 Acute posthemorrhagic anemia: Secondary | ICD-10-CM | POA: Diagnosis not present

## 2020-10-06 DIAGNOSIS — L570 Actinic keratosis: Secondary | ICD-10-CM | POA: Diagnosis not present

## 2020-10-06 DIAGNOSIS — C44629 Squamous cell carcinoma of skin of left upper limb, including shoulder: Secondary | ICD-10-CM | POA: Diagnosis not present

## 2020-10-06 DIAGNOSIS — K922 Gastrointestinal hemorrhage, unspecified: Secondary | ICD-10-CM | POA: Diagnosis not present

## 2020-10-06 DIAGNOSIS — L89620 Pressure ulcer of left heel, unstageable: Secondary | ICD-10-CM | POA: Diagnosis not present

## 2020-10-06 DIAGNOSIS — E1140 Type 2 diabetes mellitus with diabetic neuropathy, unspecified: Secondary | ICD-10-CM | POA: Diagnosis not present

## 2020-10-06 DIAGNOSIS — I2699 Other pulmonary embolism without acute cor pulmonale: Secondary | ICD-10-CM | POA: Diagnosis not present

## 2020-10-06 DIAGNOSIS — E538 Deficiency of other specified B group vitamins: Secondary | ICD-10-CM | POA: Diagnosis not present

## 2020-10-06 DIAGNOSIS — N400 Enlarged prostate without lower urinary tract symptoms: Secondary | ICD-10-CM | POA: Diagnosis not present

## 2020-10-06 DIAGNOSIS — C44319 Basal cell carcinoma of skin of other parts of face: Secondary | ICD-10-CM | POA: Diagnosis not present

## 2020-10-07 DIAGNOSIS — E538 Deficiency of other specified B group vitamins: Secondary | ICD-10-CM | POA: Diagnosis not present

## 2020-10-07 DIAGNOSIS — E039 Hypothyroidism, unspecified: Secondary | ICD-10-CM | POA: Diagnosis not present

## 2020-10-07 DIAGNOSIS — N400 Enlarged prostate without lower urinary tract symptoms: Secondary | ICD-10-CM | POA: Diagnosis not present

## 2020-10-07 DIAGNOSIS — K922 Gastrointestinal hemorrhage, unspecified: Secondary | ICD-10-CM | POA: Diagnosis not present

## 2020-10-07 DIAGNOSIS — I2699 Other pulmonary embolism without acute cor pulmonale: Secondary | ICD-10-CM | POA: Diagnosis not present

## 2020-10-07 DIAGNOSIS — E1140 Type 2 diabetes mellitus with diabetic neuropathy, unspecified: Secondary | ICD-10-CM | POA: Diagnosis not present

## 2020-10-07 DIAGNOSIS — I2510 Atherosclerotic heart disease of native coronary artery without angina pectoris: Secondary | ICD-10-CM | POA: Diagnosis not present

## 2020-10-07 DIAGNOSIS — L89620 Pressure ulcer of left heel, unstageable: Secondary | ICD-10-CM | POA: Diagnosis not present

## 2020-10-07 DIAGNOSIS — D62 Acute posthemorrhagic anemia: Secondary | ICD-10-CM | POA: Diagnosis not present

## 2020-10-08 DIAGNOSIS — N400 Enlarged prostate without lower urinary tract symptoms: Secondary | ICD-10-CM | POA: Diagnosis not present

## 2020-10-08 DIAGNOSIS — E1140 Type 2 diabetes mellitus with diabetic neuropathy, unspecified: Secondary | ICD-10-CM | POA: Diagnosis not present

## 2020-10-08 DIAGNOSIS — E039 Hypothyroidism, unspecified: Secondary | ICD-10-CM | POA: Diagnosis not present

## 2020-10-08 DIAGNOSIS — D62 Acute posthemorrhagic anemia: Secondary | ICD-10-CM | POA: Diagnosis not present

## 2020-10-08 DIAGNOSIS — E538 Deficiency of other specified B group vitamins: Secondary | ICD-10-CM | POA: Diagnosis not present

## 2020-10-08 DIAGNOSIS — I2510 Atherosclerotic heart disease of native coronary artery without angina pectoris: Secondary | ICD-10-CM | POA: Diagnosis not present

## 2020-10-08 DIAGNOSIS — L89620 Pressure ulcer of left heel, unstageable: Secondary | ICD-10-CM | POA: Diagnosis not present

## 2020-10-08 DIAGNOSIS — I2699 Other pulmonary embolism without acute cor pulmonale: Secondary | ICD-10-CM | POA: Diagnosis not present

## 2020-10-08 DIAGNOSIS — K922 Gastrointestinal hemorrhage, unspecified: Secondary | ICD-10-CM | POA: Diagnosis not present

## 2020-10-09 DIAGNOSIS — E1140 Type 2 diabetes mellitus with diabetic neuropathy, unspecified: Secondary | ICD-10-CM | POA: Diagnosis not present

## 2020-10-09 DIAGNOSIS — E538 Deficiency of other specified B group vitamins: Secondary | ICD-10-CM | POA: Diagnosis not present

## 2020-10-09 DIAGNOSIS — N400 Enlarged prostate without lower urinary tract symptoms: Secondary | ICD-10-CM | POA: Diagnosis not present

## 2020-10-09 DIAGNOSIS — D62 Acute posthemorrhagic anemia: Secondary | ICD-10-CM | POA: Diagnosis not present

## 2020-10-09 DIAGNOSIS — L89620 Pressure ulcer of left heel, unstageable: Secondary | ICD-10-CM | POA: Diagnosis not present

## 2020-10-09 DIAGNOSIS — K922 Gastrointestinal hemorrhage, unspecified: Secondary | ICD-10-CM | POA: Diagnosis not present

## 2020-10-09 DIAGNOSIS — E039 Hypothyroidism, unspecified: Secondary | ICD-10-CM | POA: Diagnosis not present

## 2020-10-09 DIAGNOSIS — I2699 Other pulmonary embolism without acute cor pulmonale: Secondary | ICD-10-CM | POA: Diagnosis not present

## 2020-10-09 DIAGNOSIS — I2510 Atherosclerotic heart disease of native coronary artery without angina pectoris: Secondary | ICD-10-CM | POA: Diagnosis not present

## 2020-10-12 DIAGNOSIS — D62 Acute posthemorrhagic anemia: Secondary | ICD-10-CM | POA: Diagnosis not present

## 2020-10-12 DIAGNOSIS — I2510 Atherosclerotic heart disease of native coronary artery without angina pectoris: Secondary | ICD-10-CM | POA: Diagnosis not present

## 2020-10-12 DIAGNOSIS — K922 Gastrointestinal hemorrhage, unspecified: Secondary | ICD-10-CM | POA: Diagnosis not present

## 2020-10-12 DIAGNOSIS — E039 Hypothyroidism, unspecified: Secondary | ICD-10-CM | POA: Diagnosis not present

## 2020-10-12 DIAGNOSIS — I2699 Other pulmonary embolism without acute cor pulmonale: Secondary | ICD-10-CM | POA: Diagnosis not present

## 2020-10-12 DIAGNOSIS — L89620 Pressure ulcer of left heel, unstageable: Secondary | ICD-10-CM | POA: Diagnosis not present

## 2020-10-12 DIAGNOSIS — E538 Deficiency of other specified B group vitamins: Secondary | ICD-10-CM | POA: Diagnosis not present

## 2020-10-12 DIAGNOSIS — N400 Enlarged prostate without lower urinary tract symptoms: Secondary | ICD-10-CM | POA: Diagnosis not present

## 2020-10-12 DIAGNOSIS — E1140 Type 2 diabetes mellitus with diabetic neuropathy, unspecified: Secondary | ICD-10-CM | POA: Diagnosis not present

## 2020-10-13 ENCOUNTER — Ambulatory Visit (HOSPITAL_BASED_OUTPATIENT_CLINIC_OR_DEPARTMENT_OTHER)
Admission: RE | Admit: 2020-10-13 | Discharge: 2020-10-13 | Disposition: A | Discharge status: Home / Self Care | Payer: Medicare PPO | Source: Ambulatory Visit | Attending: Cardiology | Admitting: Cardiology

## 2020-10-13 ENCOUNTER — Ambulatory Visit (HOSPITAL_COMMUNITY)
Admission: RE | Admit: 2020-10-13 | Discharge: 2020-10-13 | Disposition: A | Discharge status: Home / Self Care | Payer: Medicare PPO | Source: Ambulatory Visit | Attending: Cardiology | Admitting: Cardiology

## 2020-10-13 ENCOUNTER — Other Ambulatory Visit: Payer: Self-pay

## 2020-10-13 DIAGNOSIS — I771 Stricture of artery: Secondary | ICD-10-CM | POA: Diagnosis not present

## 2020-10-13 DIAGNOSIS — I25709 Atherosclerosis of coronary artery bypass graft(s), unspecified, with unspecified angina pectoris: Secondary | ICD-10-CM | POA: Diagnosis not present

## 2020-10-13 DIAGNOSIS — E785 Hyperlipidemia, unspecified: Secondary | ICD-10-CM | POA: Insufficient documentation

## 2020-10-14 DIAGNOSIS — E039 Hypothyroidism, unspecified: Secondary | ICD-10-CM | POA: Diagnosis not present

## 2020-10-14 DIAGNOSIS — I10 Essential (primary) hypertension: Secondary | ICD-10-CM | POA: Diagnosis not present

## 2020-10-14 DIAGNOSIS — E1142 Type 2 diabetes mellitus with diabetic polyneuropathy: Secondary | ICD-10-CM | POA: Diagnosis not present

## 2020-10-14 DIAGNOSIS — D638 Anemia in other chronic diseases classified elsewhere: Secondary | ICD-10-CM | POA: Diagnosis not present

## 2020-10-14 DIAGNOSIS — E1165 Type 2 diabetes mellitus with hyperglycemia: Secondary | ICD-10-CM | POA: Diagnosis not present

## 2020-10-14 DIAGNOSIS — E785 Hyperlipidemia, unspecified: Secondary | ICD-10-CM | POA: Diagnosis not present

## 2020-10-14 DIAGNOSIS — K219 Gastro-esophageal reflux disease without esophagitis: Secondary | ICD-10-CM | POA: Diagnosis not present

## 2020-10-14 DIAGNOSIS — I25810 Atherosclerosis of coronary artery bypass graft(s) without angina pectoris: Secondary | ICD-10-CM | POA: Diagnosis not present

## 2020-10-15 DIAGNOSIS — K922 Gastrointestinal hemorrhage, unspecified: Secondary | ICD-10-CM | POA: Diagnosis not present

## 2020-10-15 DIAGNOSIS — E039 Hypothyroidism, unspecified: Secondary | ICD-10-CM | POA: Diagnosis not present

## 2020-10-15 DIAGNOSIS — I2510 Atherosclerotic heart disease of native coronary artery without angina pectoris: Secondary | ICD-10-CM | POA: Diagnosis not present

## 2020-10-15 DIAGNOSIS — I2699 Other pulmonary embolism without acute cor pulmonale: Secondary | ICD-10-CM | POA: Diagnosis not present

## 2020-10-15 DIAGNOSIS — E1140 Type 2 diabetes mellitus with diabetic neuropathy, unspecified: Secondary | ICD-10-CM | POA: Diagnosis not present

## 2020-10-15 DIAGNOSIS — L89620 Pressure ulcer of left heel, unstageable: Secondary | ICD-10-CM | POA: Diagnosis not present

## 2020-10-15 DIAGNOSIS — N400 Enlarged prostate without lower urinary tract symptoms: Secondary | ICD-10-CM | POA: Diagnosis not present

## 2020-10-15 DIAGNOSIS — D62 Acute posthemorrhagic anemia: Secondary | ICD-10-CM | POA: Diagnosis not present

## 2020-10-15 DIAGNOSIS — E538 Deficiency of other specified B group vitamins: Secondary | ICD-10-CM | POA: Diagnosis not present

## 2020-10-16 ENCOUNTER — Encounter: Payer: Self-pay | Admitting: Cardiovascular Disease

## 2020-10-16 ENCOUNTER — Other Ambulatory Visit: Payer: Self-pay

## 2020-10-16 ENCOUNTER — Ambulatory Visit: Payer: Medicare PPO | Admitting: Cardiovascular Disease

## 2020-10-16 VITALS — BP 122/60 | HR 71 | Ht 70.0 in | Wt 171.0 lb

## 2020-10-16 DIAGNOSIS — I739 Peripheral vascular disease, unspecified: Secondary | ICD-10-CM

## 2020-10-16 DIAGNOSIS — I25709 Atherosclerosis of coronary artery bypass graft(s), unspecified, with unspecified angina pectoris: Secondary | ICD-10-CM

## 2020-10-16 DIAGNOSIS — I10 Essential (primary) hypertension: Secondary | ICD-10-CM | POA: Diagnosis not present

## 2020-10-16 NOTE — Assessment & Plan Note (Signed)
Alexander Choi was referred to me by Dr. [name], his cardiologist, for evaluation of PAD.  He does have a small wound on his left heel which apparently is healing.  He denies claudication.  He did have Doppler studies performed in our office 10/13/2020 revealing ABIs of 0.75 bilaterally with occluded SFAs.  At this point, in the absence of lifestyle limiting claudication and with a wound that slowly healing on its own I do not feel compelled to perform an invasive endovascular procedure.  I will see him back in 6 months for follow-up.

## 2020-10-16 NOTE — Patient Instructions (Addendum)

## 2020-10-16 NOTE — Progress Notes (Signed)
10/16/2020 Alexander Choi   Aug 07, 1932  [id number]  Primary Physician Alexander Dress, MD Primary Cardiologist: Alexander Harp MD Alexander Choi, [place]  HPI:  Alexander Choi is a 87 y.o. 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 [name], for evaluation of PAD.  He is accompanied by his son Alexander Choi today.  His risk factors include treated diabetes hypertension and hyperlipidemia.  He did have coronary artery bypass grafting in 2006 and had a recent heart cath in June 2021 by Dr. [name] in the evaluation of dyspnea that showed patent grafts.  He does have a GERD.  He has a small wound on his left heel which is slowly healing.  He really denies claudication.  He had Dopplers performed 10/13/2020 revealing ABIs of 0.75 bilaterally with occluded SFAs bilaterally.  He denies chest pain or shortness of breath.   Current Meds  Medication Sig  . Accu-Chek Softclix Lancets lancets   . Accu-Chek Softclix Lancets lancets   . albuterol (VENTOLIN HFA) 108 (90 Base) MCG/ACT inhaler Inhale 2 puffs into the lungs every 6 (six) hours as needed for wheezing or shortness of breath.  Marland Kitchen amLODipine (NORVASC) 5 MG tablet Take 5 mg by mouth at bedtime.  Marland Kitchen aspirin 81 MG tablet Take 81 mg by mouth daily.  Marland Kitchen atorvastatin (LIPITOR) 80 MG tablet Take 1 tablet (80 mg total) by mouth daily.  . cimetidine (TAGAMET) 400 MG tablet Take 400 mg by mouth daily as needed (heartburn).   . ferrous sulfate 325 (65 FE) MG EC tablet Take 1 tablet (325 mg total) by mouth in the morning and at bedtime.  . finasteride (PROSCAR) 5 MG tablet   . furosemide (LASIX) 40 MG tablet Take 1 tablet (40 mg total) by mouth daily.  Marland Kitchen gabapentin (NEURONTIN) 100 MG capsule Take 1 capsule (100 mg total) by mouth 3 (three) times daily.  Marland Kitchen glimepiride (AMARYL) 4 MG tablet Take 4 mg by mouth daily with breakfast.  . glucose  blood (ACCU-CHEK AVIVA PLUS) test strip   . levothyroxine (SYNTHROID) 100 MCG tablet Take 1 tablet (100 mcg total) by mouth daily before breakfast.  . losartan (COZAAR) 100 MG tablet Take 100 mg by mouth daily.  . meclizine (ANTIVERT) 25 MG tablet Take 1 tablet (25 mg total) by mouth 3 (three) times daily as needed for dizziness.  . metFORMIN (GLUCOPHAGE) 500 MG tablet Take 1 tablet (500 mg total) by mouth 2 (two) times daily with a meal. (Patient taking differently: Take 500 mg by mouth 2 (two) times daily with a meal. Pt takes 1 tablet daily.)  . Multiple Vitamins-Minerals (ICAPS AREDS 2 PO) Take 1 Cartridge by mouth in the morning and at bedtime.  . nitroGLYCERIN (NITROSTAT) 0.4 MG SL tablet Place 1 tablet (0.4 mg total) under the tongue every 5 (five) minutes as needed for chest pain.  . NONFORMULARY OR COMPOUNDED ITEM [facility]:  Peripheral Neuropathy Cream - Bupivacaine 1%, doxepin 3%, Gabapentin 6%, Pentoxifylline 3%, Topiramate 1%, apply 1-2 grams to affected area 3-4 times daily. (Patient taking differently: [facility]:  Peripheral Neuropathy Cream - Bupivacaine 1%, doxepin 3%, Gabapentin 6%, Pentoxifylline 3%, Topiramate 1%, apply 1-2 grams to affected area 3-4 times daily.)  . ondansetron (ZOFRAN) 4 MG tablet Take 1 tablet (4 mg total) by mouth every 8 (eight) hours as needed for nausea or vomiting.  Marland Kitchen  OVER THE COUNTER MEDICATION Take 2 capsules by mouth daily. Nerverenew Neuropathy Support Formula (B1 & B12 Vitamins)  . pantoprazole (PROTONIX) 40 MG tablet Take 1 tablet (40 mg total) by mouth daily. (Patient taking differently: Take 40 mg by mouth 2 (two) times daily.)  . polyethylene glycol powder (GLYCOLAX/MIRALAX) 17 GM/SCOOP powder Take 255 g by mouth daily.  . ranolazine (RANEXA) 500 MG 12 hr tablet Take 1 tablet (500 mg total) by mouth 2 (two) times daily.  . vitamin B-12 (CYANOCOBALAMIN) 1000 MCG tablet Take 1 tablet (1,000 mcg total) by mouth daily.   Current  Facility-Administered Medications for the 10/16/20 encounter (Office Visit) with Alexander Harp, MD  Medication  . sodium chloride flush (NS) 0.9 % injection 3 mL     Allergies  Allergen Reactions  . Ace Inhibitors Other (See Comments)    unknown  . Nitrates, Organic Swelling    Swelling in hands  . Shellfish Allergy Nausea Only    Pt states scallops-not all shellfish-GI symptoms/ nausea -50+ years ago  . Penicillins Rash    Social History   Socioeconomic History  . Marital status: Widowed    Spouse name: Not on file  . Number of children: 3  . Years of education: Not on file  . Highest education level: Not on file  Occupational History  . Occupation: state trooper    Comment: retired  . 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 Smoking status: Former Smoker    Packs/day: 3.00  . Smokeless tobacco: Never Used  Vaping Use  . Vaping Use: Never used  Substance and Sexual Activity  . Alcohol use: No  . Drug use: No  . Sexual activity: Not Currently  Other Topics Concern  . Not on file  Social History Narrative  . Not on file   Social Determinants of Health   Financial Resource Strain: Not on file  Food Insecurity: Not on file  Transportation Needs: Not on file  Physical Activity: Not on file  Stress: Not on file  Social Connections: Not on file  Intimate Partner Violence: Not on file     Review of Systems: General: negative for chills, fever, night sweats or weight changes.  Cardiovascular: negative for chest pain, dyspnea on exertion, edema, orthopnea, palpitations, paroxysmal nocturnal dyspnea or shortness of breath Dermatological: negative for rash Respiratory: negative for cough or wheezing Urologic: negative for hematuria Abdominal: negative for nausea, vomiting, diarrhea, bright red blood per rectum, melena, or hematemesis Neurologic: negative for visual changes, syncope, or dizziness All other systems reviewed and are otherwise negative except as noted  above.    Blood pressure 122/60, pulse 71, height 5\' 10"  (1.778 m), weight 171 lb (77.6 kg).  General appearance: alert and no distress Neck: no adenopathy, no carotid bruit, no JVD, supple, symmetrical, trachea midline and thyroid not enlarged, symmetric, no tenderness/mass/nodules Lungs: clear to auscultation bilaterally Heart: regular rate and rhythm, S1, S2 normal, no murmur, click, rub or gallop Extremities: extremities normal, atraumatic, no cyanosis or edema Pulses: 2+ and symmetric Absent pedal pulses bilaterally Skin: Skin color, texture, turgor normal. No rashes or lesions Neurologic: Alert and oriented X 3, normal strength and tone. Normal symmetric reflexes. Normal coordination and gait  EKG sinus rhythm at 71 without ST or T wave changes.  I personally reviewed this EKG.  ASSESSMENT AND PLAN:   Peripheral arterial disease [facility]) Mr. Gautier was referred to me by Dr. [name], his cardiologist, for evaluation of PAD.  He does have  a small wound on his left heel which apparently is healing.  He denies claudication.  He did have Doppler studies performed in our office 10/13/2020 revealing ABIs of 0.75 bilaterally with occluded SFAs.  At this point, in the absence of lifestyle limiting claudication and with a wound that slowly healing on its own I do not feel compelled to perform an invasive endovascular procedure.  I will see him back in 6 months for follow-up.      Alexander Harp MD FACP,FACC,FAHA, [facility] 10/16/2020 11:27 AM

## 2020-10-19 DIAGNOSIS — I2699 Other pulmonary embolism without acute cor pulmonale: Secondary | ICD-10-CM | POA: Diagnosis not present

## 2020-10-19 DIAGNOSIS — D62 Acute posthemorrhagic anemia: Secondary | ICD-10-CM | POA: Diagnosis not present

## 2020-10-19 DIAGNOSIS — I2510 Atherosclerotic heart disease of native coronary artery without angina pectoris: Secondary | ICD-10-CM | POA: Diagnosis not present

## 2020-10-19 DIAGNOSIS — E538 Deficiency of other specified B group vitamins: Secondary | ICD-10-CM | POA: Diagnosis not present

## 2020-10-19 DIAGNOSIS — L89620 Pressure ulcer of left heel, unstageable: Secondary | ICD-10-CM | POA: Diagnosis not present

## 2020-10-19 DIAGNOSIS — E039 Hypothyroidism, unspecified: Secondary | ICD-10-CM | POA: Diagnosis not present

## 2020-10-19 DIAGNOSIS — K922 Gastrointestinal hemorrhage, unspecified: Secondary | ICD-10-CM | POA: Diagnosis not present

## 2020-10-19 DIAGNOSIS — N400 Enlarged prostate without lower urinary tract symptoms: Secondary | ICD-10-CM | POA: Diagnosis not present

## 2020-10-19 DIAGNOSIS — E1140 Type 2 diabetes mellitus with diabetic neuropathy, unspecified: Secondary | ICD-10-CM | POA: Diagnosis not present

## 2020-10-20 DIAGNOSIS — C44329 Squamous cell carcinoma of skin of other parts of face: Secondary | ICD-10-CM | POA: Diagnosis not present

## 2020-10-21 DIAGNOSIS — L89620 Pressure ulcer of left heel, unstageable: Secondary | ICD-10-CM | POA: Diagnosis not present

## 2020-10-21 DIAGNOSIS — E538 Deficiency of other specified B group vitamins: Secondary | ICD-10-CM | POA: Diagnosis not present

## 2020-10-21 DIAGNOSIS — E039 Hypothyroidism, unspecified: Secondary | ICD-10-CM | POA: Diagnosis not present

## 2020-10-21 DIAGNOSIS — I2510 Atherosclerotic heart disease of native coronary artery without angina pectoris: Secondary | ICD-10-CM | POA: Diagnosis not present

## 2020-10-21 DIAGNOSIS — N400 Enlarged prostate without lower urinary tract symptoms: Secondary | ICD-10-CM | POA: Diagnosis not present

## 2020-10-21 DIAGNOSIS — D62 Acute posthemorrhagic anemia: Secondary | ICD-10-CM | POA: Diagnosis not present

## 2020-10-21 DIAGNOSIS — E1140 Type 2 diabetes mellitus with diabetic neuropathy, unspecified: Secondary | ICD-10-CM | POA: Diagnosis not present

## 2020-10-21 DIAGNOSIS — I2699 Other pulmonary embolism without acute cor pulmonale: Secondary | ICD-10-CM | POA: Diagnosis not present

## 2020-10-21 DIAGNOSIS — K922 Gastrointestinal hemorrhage, unspecified: Secondary | ICD-10-CM | POA: Diagnosis not present

## 2020-10-22 DIAGNOSIS — E039 Hypothyroidism, unspecified: Secondary | ICD-10-CM | POA: Diagnosis not present

## 2020-10-22 DIAGNOSIS — N400 Enlarged prostate without lower urinary tract symptoms: Secondary | ICD-10-CM | POA: Diagnosis not present

## 2020-10-22 DIAGNOSIS — I2510 Atherosclerotic heart disease of native coronary artery without angina pectoris: Secondary | ICD-10-CM | POA: Diagnosis not present

## 2020-10-22 DIAGNOSIS — E1140 Type 2 diabetes mellitus with diabetic neuropathy, unspecified: Secondary | ICD-10-CM | POA: Diagnosis not present

## 2020-10-22 DIAGNOSIS — D62 Acute posthemorrhagic anemia: Secondary | ICD-10-CM | POA: Diagnosis not present

## 2020-10-22 DIAGNOSIS — L89620 Pressure ulcer of left heel, unstageable: Secondary | ICD-10-CM | POA: Diagnosis not present

## 2020-10-22 DIAGNOSIS — E538 Deficiency of other specified B group vitamins: Secondary | ICD-10-CM | POA: Diagnosis not present

## 2020-10-22 DIAGNOSIS — K922 Gastrointestinal hemorrhage, unspecified: Secondary | ICD-10-CM | POA: Diagnosis not present

## 2020-10-22 DIAGNOSIS — I2699 Other pulmonary embolism without acute cor pulmonale: Secondary | ICD-10-CM | POA: Diagnosis not present

## 2020-10-26 DIAGNOSIS — K922 Gastrointestinal hemorrhage, unspecified: Secondary | ICD-10-CM | POA: Diagnosis not present

## 2020-10-26 DIAGNOSIS — I2699 Other pulmonary embolism without acute cor pulmonale: Secondary | ICD-10-CM | POA: Diagnosis not present

## 2020-10-26 DIAGNOSIS — E039 Hypothyroidism, unspecified: Secondary | ICD-10-CM | POA: Diagnosis not present

## 2020-10-26 DIAGNOSIS — L89620 Pressure ulcer of left heel, unstageable: Secondary | ICD-10-CM | POA: Diagnosis not present

## 2020-10-26 DIAGNOSIS — I2510 Atherosclerotic heart disease of native coronary artery without angina pectoris: Secondary | ICD-10-CM | POA: Diagnosis not present

## 2020-10-26 DIAGNOSIS — E1140 Type 2 diabetes mellitus with diabetic neuropathy, unspecified: Secondary | ICD-10-CM | POA: Diagnosis not present

## 2020-10-26 DIAGNOSIS — E538 Deficiency of other specified B group vitamins: Secondary | ICD-10-CM | POA: Diagnosis not present

## 2020-10-26 DIAGNOSIS — D62 Acute posthemorrhagic anemia: Secondary | ICD-10-CM | POA: Diagnosis not present

## 2020-10-26 DIAGNOSIS — N400 Enlarged prostate without lower urinary tract symptoms: Secondary | ICD-10-CM | POA: Diagnosis not present

## 2020-10-28 DIAGNOSIS — I2699 Other pulmonary embolism without acute cor pulmonale: Secondary | ICD-10-CM | POA: Diagnosis not present

## 2020-10-28 DIAGNOSIS — D62 Acute posthemorrhagic anemia: Secondary | ICD-10-CM | POA: Diagnosis not present

## 2020-10-28 DIAGNOSIS — L89620 Pressure ulcer of left heel, unstageable: Secondary | ICD-10-CM | POA: Diagnosis not present

## 2020-10-28 DIAGNOSIS — I2510 Atherosclerotic heart disease of native coronary artery without angina pectoris: Secondary | ICD-10-CM | POA: Diagnosis not present

## 2020-10-28 DIAGNOSIS — E039 Hypothyroidism, unspecified: Secondary | ICD-10-CM | POA: Diagnosis not present

## 2020-10-28 DIAGNOSIS — N400 Enlarged prostate without lower urinary tract symptoms: Secondary | ICD-10-CM | POA: Diagnosis not present

## 2020-10-28 DIAGNOSIS — K922 Gastrointestinal hemorrhage, unspecified: Secondary | ICD-10-CM | POA: Diagnosis not present

## 2020-10-28 DIAGNOSIS — E1140 Type 2 diabetes mellitus with diabetic neuropathy, unspecified: Secondary | ICD-10-CM | POA: Diagnosis not present

## 2020-10-28 DIAGNOSIS — E538 Deficiency of other specified B group vitamins: Secondary | ICD-10-CM | POA: Diagnosis not present

## 2020-11-02 DIAGNOSIS — E039 Hypothyroidism, unspecified: Secondary | ICD-10-CM | POA: Diagnosis not present

## 2020-11-02 DIAGNOSIS — E538 Deficiency of other specified B group vitamins: Secondary | ICD-10-CM | POA: Diagnosis not present

## 2020-11-02 DIAGNOSIS — I2510 Atherosclerotic heart disease of native coronary artery without angina pectoris: Secondary | ICD-10-CM | POA: Diagnosis not present

## 2020-11-02 DIAGNOSIS — E1140 Type 2 diabetes mellitus with diabetic neuropathy, unspecified: Secondary | ICD-10-CM | POA: Diagnosis not present

## 2020-11-02 DIAGNOSIS — L89620 Pressure ulcer of left heel, unstageable: Secondary | ICD-10-CM | POA: Diagnosis not present

## 2020-11-02 DIAGNOSIS — K922 Gastrointestinal hemorrhage, unspecified: Secondary | ICD-10-CM | POA: Diagnosis not present

## 2020-11-02 DIAGNOSIS — I2699 Other pulmonary embolism without acute cor pulmonale: Secondary | ICD-10-CM | POA: Diagnosis not present

## 2020-11-02 DIAGNOSIS — N400 Enlarged prostate without lower urinary tract symptoms: Secondary | ICD-10-CM | POA: Diagnosis not present

## 2020-11-02 DIAGNOSIS — D62 Acute posthemorrhagic anemia: Secondary | ICD-10-CM | POA: Diagnosis not present

## 2020-11-03 DIAGNOSIS — C441291 Squamous cell carcinoma of skin of left upper eyelid, including canthus: Secondary | ICD-10-CM | POA: Diagnosis not present

## 2020-11-03 DIAGNOSIS — L570 Actinic keratosis: Secondary | ICD-10-CM | POA: Diagnosis not present

## 2020-11-04 DIAGNOSIS — E538 Deficiency of other specified B group vitamins: Secondary | ICD-10-CM | POA: Diagnosis not present

## 2020-11-04 DIAGNOSIS — E039 Hypothyroidism, unspecified: Secondary | ICD-10-CM | POA: Diagnosis not present

## 2020-11-04 DIAGNOSIS — E1140 Type 2 diabetes mellitus with diabetic neuropathy, unspecified: Secondary | ICD-10-CM | POA: Diagnosis not present

## 2020-11-04 DIAGNOSIS — L89620 Pressure ulcer of left heel, unstageable: Secondary | ICD-10-CM | POA: Diagnosis not present

## 2020-11-04 DIAGNOSIS — D62 Acute posthemorrhagic anemia: Secondary | ICD-10-CM | POA: Diagnosis not present

## 2020-11-04 DIAGNOSIS — I2510 Atherosclerotic heart disease of native coronary artery without angina pectoris: Secondary | ICD-10-CM | POA: Diagnosis not present

## 2020-11-04 DIAGNOSIS — N400 Enlarged prostate without lower urinary tract symptoms: Secondary | ICD-10-CM | POA: Diagnosis not present

## 2020-11-04 DIAGNOSIS — K922 Gastrointestinal hemorrhage, unspecified: Secondary | ICD-10-CM | POA: Diagnosis not present

## 2020-11-04 DIAGNOSIS — I2699 Other pulmonary embolism without acute cor pulmonale: Secondary | ICD-10-CM | POA: Diagnosis not present

## 2020-11-11 DIAGNOSIS — R531 Weakness: Secondary | ICD-10-CM | POA: Diagnosis not present

## 2020-11-11 DIAGNOSIS — R2681 Unsteadiness on feet: Secondary | ICD-10-CM | POA: Diagnosis not present

## 2020-11-19 DIAGNOSIS — E1165 Type 2 diabetes mellitus with hyperglycemia: Secondary | ICD-10-CM | POA: Diagnosis not present

## 2020-11-19 DIAGNOSIS — E1140 Type 2 diabetes mellitus with diabetic neuropathy, unspecified: Secondary | ICD-10-CM | POA: Diagnosis not present

## 2020-11-20 DIAGNOSIS — R2681 Unsteadiness on feet: Secondary | ICD-10-CM | POA: Diagnosis not present

## 2020-11-20 DIAGNOSIS — R531 Weakness: Secondary | ICD-10-CM | POA: Diagnosis not present

## 2020-11-25 DIAGNOSIS — R531 Weakness: Secondary | ICD-10-CM | POA: Diagnosis not present

## 2020-11-25 DIAGNOSIS — R2681 Unsteadiness on feet: Secondary | ICD-10-CM | POA: Diagnosis not present

## 2020-11-27 DIAGNOSIS — R531 Weakness: Secondary | ICD-10-CM | POA: Diagnosis not present

## 2020-11-27 DIAGNOSIS — R2681 Unsteadiness on feet: Secondary | ICD-10-CM | POA: Diagnosis not present

## 2020-11-30 DIAGNOSIS — R531 Weakness: Secondary | ICD-10-CM | POA: Diagnosis not present

## 2020-11-30 DIAGNOSIS — R2681 Unsteadiness on feet: Secondary | ICD-10-CM | POA: Diagnosis not present

## 2020-12-02 DIAGNOSIS — R2681 Unsteadiness on feet: Secondary | ICD-10-CM | POA: Diagnosis not present

## 2020-12-02 DIAGNOSIS — R531 Weakness: Secondary | ICD-10-CM | POA: Diagnosis not present

## 2020-12-08 DIAGNOSIS — R2681 Unsteadiness on feet: Secondary | ICD-10-CM | POA: Diagnosis not present

## 2020-12-08 DIAGNOSIS — R531 Weakness: Secondary | ICD-10-CM | POA: Diagnosis not present

## 2020-12-10 DIAGNOSIS — R531 Weakness: Secondary | ICD-10-CM | POA: Diagnosis not present

## 2020-12-10 DIAGNOSIS — R2681 Unsteadiness on feet: Secondary | ICD-10-CM | POA: Diagnosis not present

## 2020-12-15 DIAGNOSIS — R2681 Unsteadiness on feet: Secondary | ICD-10-CM | POA: Diagnosis not present

## 2020-12-15 DIAGNOSIS — R531 Weakness: Secondary | ICD-10-CM | POA: Diagnosis not present

## 2020-12-16 DIAGNOSIS — M1611 Unilateral primary osteoarthritis, right hip: Secondary | ICD-10-CM | POA: Diagnosis not present

## 2020-12-17 DIAGNOSIS — R2681 Unsteadiness on feet: Secondary | ICD-10-CM | POA: Diagnosis not present

## 2020-12-17 DIAGNOSIS — R531 Weakness: Secondary | ICD-10-CM | POA: Diagnosis not present

## 2020-12-21 DIAGNOSIS — Z96651 Presence of right artificial knee joint: Secondary | ICD-10-CM | POA: Diagnosis not present

## 2020-12-21 DIAGNOSIS — M7052 Other bursitis of knee, left knee: Secondary | ICD-10-CM | POA: Diagnosis not present

## 2020-12-22 DIAGNOSIS — R2681 Unsteadiness on feet: Secondary | ICD-10-CM | POA: Diagnosis not present

## 2020-12-22 DIAGNOSIS — R531 Weakness: Secondary | ICD-10-CM | POA: Diagnosis not present

## 2020-12-23 DIAGNOSIS — M14672 Charcot's joint, left ankle and foot: Secondary | ICD-10-CM | POA: Diagnosis not present

## 2020-12-23 DIAGNOSIS — Z7984 Long term (current) use of oral hypoglycemic drugs: Secondary | ICD-10-CM | POA: Diagnosis not present

## 2020-12-23 DIAGNOSIS — M14671 Charcot's joint, right ankle and foot: Secondary | ICD-10-CM | POA: Diagnosis not present

## 2020-12-23 DIAGNOSIS — E1142 Type 2 diabetes mellitus with diabetic polyneuropathy: Secondary | ICD-10-CM | POA: Diagnosis not present

## 2020-12-23 DIAGNOSIS — L84 Corns and callosities: Secondary | ICD-10-CM | POA: Diagnosis not present

## 2020-12-25 DIAGNOSIS — H903 Sensorineural hearing loss, bilateral: Secondary | ICD-10-CM | POA: Diagnosis not present

## 2020-12-29 DIAGNOSIS — R531 Weakness: Secondary | ICD-10-CM | POA: Diagnosis not present

## 2020-12-29 DIAGNOSIS — R2681 Unsteadiness on feet: Secondary | ICD-10-CM | POA: Diagnosis not present

## 2020-12-31 DIAGNOSIS — R531 Weakness: Secondary | ICD-10-CM | POA: Diagnosis not present

## 2020-12-31 DIAGNOSIS — R2681 Unsteadiness on feet: Secondary | ICD-10-CM | POA: Diagnosis not present

## 2021-01-04 DIAGNOSIS — K5900 Constipation, unspecified: Secondary | ICD-10-CM | POA: Diagnosis not present

## 2021-01-04 DIAGNOSIS — K219 Gastro-esophageal reflux disease without esophagitis: Secondary | ICD-10-CM | POA: Diagnosis not present

## 2021-01-04 DIAGNOSIS — K922 Gastrointestinal hemorrhage, unspecified: Secondary | ICD-10-CM | POA: Diagnosis not present

## 2021-01-04 DIAGNOSIS — K921 Melena: Secondary | ICD-10-CM | POA: Diagnosis not present

## 2021-01-04 DIAGNOSIS — R1310 Dysphagia, unspecified: Secondary | ICD-10-CM | POA: Diagnosis not present

## 2021-01-05 DIAGNOSIS — R2681 Unsteadiness on feet: Secondary | ICD-10-CM | POA: Diagnosis not present

## 2021-01-05 DIAGNOSIS — R531 Weakness: Secondary | ICD-10-CM | POA: Diagnosis not present

## 2021-01-07 DIAGNOSIS — R2681 Unsteadiness on feet: Secondary | ICD-10-CM | POA: Diagnosis not present

## 2021-01-07 DIAGNOSIS — R531 Weakness: Secondary | ICD-10-CM | POA: Diagnosis not present

## 2021-01-12 DIAGNOSIS — R2681 Unsteadiness on feet: Secondary | ICD-10-CM | POA: Diagnosis not present

## 2021-01-12 DIAGNOSIS — R531 Weakness: Secondary | ICD-10-CM | POA: Diagnosis not present

## 2021-01-14 DIAGNOSIS — R531 Weakness: Secondary | ICD-10-CM | POA: Diagnosis not present

## 2021-01-14 DIAGNOSIS — R2681 Unsteadiness on feet: Secondary | ICD-10-CM | POA: Diagnosis not present

## 2021-01-15 DIAGNOSIS — E538 Deficiency of other specified B group vitamins: Secondary | ICD-10-CM | POA: Diagnosis not present

## 2021-01-15 DIAGNOSIS — I10 Essential (primary) hypertension: Secondary | ICD-10-CM | POA: Diagnosis not present

## 2021-01-15 DIAGNOSIS — E1165 Type 2 diabetes mellitus with hyperglycemia: Secondary | ICD-10-CM | POA: Diagnosis not present

## 2021-01-15 DIAGNOSIS — I25810 Atherosclerosis of coronary artery bypass graft(s) without angina pectoris: Secondary | ICD-10-CM | POA: Diagnosis not present

## 2021-01-15 DIAGNOSIS — K219 Gastro-esophageal reflux disease without esophagitis: Secondary | ICD-10-CM | POA: Diagnosis not present

## 2021-01-15 DIAGNOSIS — E039 Hypothyroidism, unspecified: Secondary | ICD-10-CM | POA: Diagnosis not present

## 2021-01-15 DIAGNOSIS — E1140 Type 2 diabetes mellitus with diabetic neuropathy, unspecified: Secondary | ICD-10-CM | POA: Diagnosis not present

## 2021-01-15 DIAGNOSIS — E785 Hyperlipidemia, unspecified: Secondary | ICD-10-CM | POA: Diagnosis not present

## 2021-01-15 DIAGNOSIS — D638 Anemia in other chronic diseases classified elsewhere: Secondary | ICD-10-CM | POA: Diagnosis not present

## 2021-01-19 DIAGNOSIS — R531 Weakness: Secondary | ICD-10-CM | POA: Diagnosis not present

## 2021-01-19 DIAGNOSIS — R2681 Unsteadiness on feet: Secondary | ICD-10-CM | POA: Diagnosis not present

## 2021-01-21 DIAGNOSIS — R2681 Unsteadiness on feet: Secondary | ICD-10-CM | POA: Diagnosis not present

## 2021-01-21 DIAGNOSIS — R531 Weakness: Secondary | ICD-10-CM | POA: Diagnosis not present

## 2021-01-26 DIAGNOSIS — R2681 Unsteadiness on feet: Secondary | ICD-10-CM | POA: Diagnosis not present

## 2021-01-26 DIAGNOSIS — R531 Weakness: Secondary | ICD-10-CM | POA: Diagnosis not present

## 2021-01-27 DIAGNOSIS — E1140 Type 2 diabetes mellitus with diabetic neuropathy, unspecified: Secondary | ICD-10-CM | POA: Diagnosis not present

## 2021-02-02 DIAGNOSIS — R531 Weakness: Secondary | ICD-10-CM | POA: Diagnosis not present

## 2021-02-02 DIAGNOSIS — R2681 Unsteadiness on feet: Secondary | ICD-10-CM | POA: Diagnosis not present

## 2021-02-06 DIAGNOSIS — I4891 Unspecified atrial fibrillation: Secondary | ICD-10-CM | POA: Diagnosis not present

## 2021-02-06 DIAGNOSIS — I639 Cerebral infarction, unspecified: Secondary | ICD-10-CM | POA: Diagnosis not present

## 2021-02-06 DIAGNOSIS — R262 Difficulty in walking, not elsewhere classified: Secondary | ICD-10-CM | POA: Diagnosis not present

## 2021-02-06 DIAGNOSIS — G40909 Epilepsy, unspecified, not intractable, without status epilepticus: Secondary | ICD-10-CM | POA: Diagnosis not present

## 2021-02-09 DIAGNOSIS — R2681 Unsteadiness on feet: Secondary | ICD-10-CM | POA: Diagnosis not present

## 2021-02-09 DIAGNOSIS — R531 Weakness: Secondary | ICD-10-CM | POA: Diagnosis not present

## 2021-03-16 NOTE — Progress Notes (Signed)
Cardiology Office Note:    Date:  03/29/2021   ID:  Alexander Choi, DOB [birth year], MRN [id number]  PCP:  Nicoletta Dress, MD  Cardiologist:  Peter [name], MD   Referring MD: Nicoletta Dress, MD   Chief Complaint  Patient presents with   Follow-up    DOE    History of Present Illness:    Alexander TSOSIE is a 87 y.o. male with a hx of CAD status post CABG in 10-04-2004, Myoview in October 2015 that showed inferior ischemia and led to a heart catheterization that showed all grafts patent with severe disease in the native left circumflex that predated his CABG.  He also has hypertension, hyperlipidemia, DM.  His wife passed away in 10-04-17.  He was admitted in October 2020 at [facility] for bowel obstruction treated with surgical lysis of adhesions.  1 week after discharge he fell in his garage and required shoulder surgery in December 2020.  He is aggressively treated for indigestion with Tagamet and Protonix without improvement.  He underwent EGD that showed mild inflammation.  He has symptoms after eating a meal but also occur with activity.  Due to some chest discomfort with radiation to his left arm, he underwent right and left heart catheterization.  Right heart pressures were normal and his angiogram was stable.  There was an attempt to open the chronically occluded left circumflex but this was unsuccessful due to inability to cross the lesion with a wire.  In retrospect the vessel was really unchanged dating back before his CABG.  He was seen in the ER 01/09/2020 for chest pain but ruled out with negative troponins.  Chest pain resolved after GI cocktail.  He was last seen by Dr. [name] on 09/29/2020 after being hospitalized at [facility] for internal bleeding.  He underwent ABIs and arterial duplex found to have PAD and referred to Dr. Gwenlyn Found. He had abnormal dopplers indicating occluded SFAs. In the absence of lifestyle limiting claudication, invasive intervention was deferred with  plans to follow up in 6 months.   He presents for routine follow up. He is here alone. He states that for the last month, he is having dyspnea on exertion. He is unable to climb stairs without SOB. SOB resolves with rest. He has issues with acid reflux. His DOE sometimes resolves with tums. He also has chest pain right before bed about 3 nights per week. This occurs with sitting, not lying flat. This has also been happening over the last month. CP not exertional. No syncope/falls, no lower extremity swelling or orthopnea. No recent illness, cough, fever or chills. Of note, BP was 123 this morning. [place]    Past Medical History:  Diagnosis Date   Cancer [facility])    Chronic kidney disease    kidney stones   Coronary artery disease    with CABG in 04-Oct-2004 with LIMA to LAD, SVG to mid and distal OM, SVG to AM. Normal Myoview in May of 2013   Diabetes mellitus    Type 2   GERD (gastroesophageal reflux disease)    Hematuria    Hypercholesterolemia    Hypertension    Irritable bowel syndrome    Loose stools    Prostate cancer ([place])    Urinary obstruction     Past Surgical History:  Procedure Laterality Date   CHOLECYSTECTOMY     CORONARY ARTERY BYPASS GRAFT  10-04-2004  LEFT HEART CATHETERIZATION WITH CORONARY/GRAFT ANGIOGRAM N/A 04/11/2014   Procedure: LEFT HEART CATHETERIZATION WITH [name];  Surgeon: Peter M [name], MD;  Location: [facility] CATH LAB;  Service: Cardiovascular;  Laterality: N/A;   OTHER SURGICAL HISTORY     bowel obstruction   RADIOACTIVE SEED IMPLANT     for prostate cancer   RIGHT/LEFT HEART CATH AND CORONARY/GRAFT ANGIOGRAPHY N/A 12/12/2019   Procedure: RIGHT/LEFT HEART CATH AND CORONARY/GRAFT ANGIOGRAPHY;  Surgeon: [name], Peter M, MD;  Location: [place] CV LAB;  Service: Cardiovascular;  Laterality: N/A;   SKIN CANCER EXCISION     TEMPORARY PACEMAKER N/A 12/13/2019   Procedure: TEMPORARY PACEMAKER;  Surgeon:  [name], Peter M, MD;  Location: [place] CV LAB;  Service: Cardiovascular;  Laterality: N/A;    Current Medications: Current Meds  Medication Sig   Accu-Chek Softclix Lancets lancets    Accu-Chek Softclix Lancets lancets    albuterol (VENTOLIN HFA) 108 (90 Base) MCG/ACT inhaler Inhale 2 puffs into the lungs every 6 (six) hours as needed for wheezing or shortness of breath.   amLODipine (NORVASC) 5 MG tablet Take 5 mg by mouth at bedtime.   aspirin 81 MG tablet Take 81 mg by mouth daily.   atorvastatin (LIPITOR) 80 MG tablet Take 1 tablet (80 mg total) by mouth daily.   cimetidine (TAGAMET) 400 MG tablet Take 400 mg by mouth daily as needed (heartburn).    ferrous sulfate 325 (65 FE) MG EC tablet Take 1 tablet (325 mg total) by mouth in the morning and at bedtime.   finasteride (PROSCAR) 5 MG tablet    furosemide (LASIX) 40 MG tablet Take 1 tablet (40 mg total) by mouth daily.   gabapentin (NEURONTIN) 100 MG capsule Take 1 capsule (100 mg total) by mouth 3 (three) times daily.   glimepiride (AMARYL) 4 MG tablet Take 4 mg by mouth daily with breakfast.   glucose blood (ACCU-CHEK AVIVA PLUS) test strip    levothyroxine (SYNTHROID) 100 MCG tablet Take 1 tablet (100 mcg total) by mouth daily before breakfast.   losartan (COZAAR) 100 MG tablet Take 100 mg by mouth daily.   meclizine (ANTIVERT) 25 MG tablet Take 1 tablet (25 mg total) by mouth 3 (three) times daily as needed for dizziness.   metFORMIN (GLUCOPHAGE) 500 MG tablet Take 1 tablet (500 mg total) by mouth 2 (two) times daily with a meal. (Patient taking differently: Take 500 mg by mouth 2 (two) times daily with a meal. Pt takes 1 tablet daily.)   Multiple Vitamins-Minerals (ICAPS AREDS 2 PO) Take 1 Cartridge by mouth in the morning and at bedtime.   nitroGLYCERIN (NITROSTAT) 0.4 MG SL tablet Place 1 tablet (0.4 mg total) under the tongue every 5 (five) minutes as needed for chest pain.   NONFORMULARY OR COMPOUNDED ITEM [facility]:  Peripheral Neuropathy Cream - Bupivacaine 1%, doxepin 3%, Gabapentin 6%, Pentoxifylline 3%, Topiramate 1%, apply 1-2 grams to affected area 3-4 times daily. (Patient taking differently: [facility]:  Peripheral Neuropathy Cream - Bupivacaine 1%, doxepin 3%, Gabapentin 6%, Pentoxifylline 3%, Topiramate 1%, apply 1-2 grams to affected area 3-4 times daily.)   ondansetron (ZOFRAN) 4 MG tablet Take 1 tablet (4 mg total) by mouth every 8 (eight) hours as needed for nausea or vomiting.   OVER THE COUNTER MEDICATION Take 2 capsules by mouth daily. N[employer]renew Neuropathy Support Formula (B1 & B12 Vitamins)   polyethylene glycol powder (GLYCOLAX/MIRALAX) 17 GM/SCOOP powder Take 255 g by mouth daily.   vitamin  B-12 (CYANOCOBALAMIN) 1000 MCG tablet Take 1 tablet (1,000 mcg total) by mouth daily.   [DISCONTINUED] ranolazine (RANEXA) 500 MG 12 hr tablet Take 1 tablet (500 mg total) by mouth 2 (two) times daily.   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  Tobacco Use   Smoking status: Former    Packs/day: 3.00    Types: Cigarettes   Smokeless tobacco: Never  Vaping Use   Vaping Use: Never used  Substance and Sexual Activity   Alcohol use: No   Drug use: No   Sexual activity: Not Currently  Other Topics Concern   Not on file  Social History Narrative   Not on file   Social Determinants of Health   Financial Resource Strain: Not on file  Food Insecurity: Not on file   Transportation Needs: Not on file  Physical Activity: Not on file  Stress: Not on file  Social Connections: Not on file     Family History: The patient's family history includes Cancer in his father and mother; Diabetes in his father; Heart attack in his father; Hypertension in his father and mother.  ROS:   Please see the history of present illness.     All other systems reviewed and are negative.  EKGs/Labs/Other Studies Reviewed:    The following studies were reviewed today:  Heart monitor 01/15/20: Normal sinus rhythm Occasional PACs. Occasional brief runs of PAT longest lasting 18.5 seconds.   Staged PCI 12/13/19: Dist LAD-1 lesion is 60% stenosed. Prox Cx lesion is 99% stenosed. Prox Cx to Mid Cx lesion is 90% stenosed. 2nd Mrg lesion is 75% stenosed. Unsuccessful PCI due to inability to cross with a wire.   1. Unsuccessful PCI of the LCx due to inability to cross the lesion with a wire.    Plan: medical therapy. ASA only. Will increase amlodipine to 5 mg daily. Consider Ranexa if symptoms persists. Anticipate DC tomorrow am.   Heart cath 12/12/19: Mid LM lesion is 35% stenosed. Ost LAD to Mid LAD lesion is 100% stenosed. Prox Cx lesion is 99% stenosed. Prox Cx to Mid Cx lesion is 90% stenosed. 1st Mrg lesion is 100% stenosed. 2nd Mrg lesion is 75% stenosed. Prox RCA to Dist RCA lesion is 100% stenosed. LIMA graft was visualized by angiography and is normal in caliber. The graft exhibits no disease. Dist LAD-1 lesion is 60% stenosed. Dist LAD-2 lesion is 90% stenosed. SVG graft was visualized by angiography and is large. The graft exhibits minimal luminal irregularities. SVG graft was visualized by angiography and is normal in caliber. The graft exhibits mild . The left ventricular systolic function is normal. LV end diastolic pressure is normal. The left ventricular ejection fraction is 55-65% by visual estimate.   1. Complex 3 vessel obstructive CAD.  2.  Patent LIMA to the LAD. There is severe disease in the distal LAD. The vessel is small in caliber and not suitable for PCI 3. Patent  SVG to OM1 4. Patent SVG to RV marginal branch. The RCA is occluded with left to right collaterals from the LCx to the distal RCA 5. Normal LV function 6. Normal LV filling pressures 7. Normal right heart pressures   Plan: The native LCX is severely diseased proximally and in the mid vessel with aneurysmal dilation and heavy calcification. This supplies the second and third OM branches and collaterals to the distal RCA. This is a potential target for intervention. It will require atherectomy. Given progressive symptoms will admit to telemetry. Start IV heparin. Load with Plavix. Planned staged complex PCI of the LCx tomorrow.   EKG:  EKG is ordered today and showed sinus rhythm HR 66, first degree heart block.    Recent Labs: No results found for requested labs within last 8760 hours.  Recent Lipid Panel    Component Value Date/Time   CHOL 120 12/16/2019 1218   TRIG 125 12/16/2019 1218   HDL 35 (L) 12/16/2019 1218   CHOLHDL 3.4 12/16/2019 1218   VLDL 25 12/16/2019 1218   LDLCALC 60 12/16/2019 1218    Physical Exam:    VS:  BP (!) 152/58   Pulse 75   Ht 5\' 10"  (1.778 m)   Wt 182 lb 6.4 oz (82.7 kg)   SpO2 100%   BMI 26.17 kg/m     Wt Readings from Last 3 Encounters:  03/29/21 182 lb 6.4 oz (82.7 kg)  10/16/20 171 lb (77.6 kg)  09/29/20 159 lb (72.1 kg)     GEN:  Well nourished, well developed in no acute distress HEENT: Normal NECK: No JVD; No carotid bruits LYMPHATICS: No lymphadenopathy CARDIAC: RRR, soft systolic murmur RESPIRATORY:  Clear to auscultation without rales, wheezing or rhonchi  ABDOMEN: Soft, non-tender, non-distended MUSCULOSKELETAL:  No edema; No deformity  SKIN: Warm and dry NEUROLOGIC:  Alert and oriented x 3 PSYCHIATRIC:  Normal affect   ASSESSMENT:    1. Murmur, heart   2. DOE (dyspnea on exertion)   3.  Coronary artery disease involving coronary bypass graft of native heart with angina pectoris ([place])   4. Hyperlipidemia LDL goal <70   5. Essential hypertension   6. Type 2 diabetes mellitus without complication, without long-term current use of insulin (HCC)   7. Peripheral arterial disease ([place])   8. Atrial tachycardia (H[facility])    PLAN:    In order of problems listed above:  Dyspnea on exertion Chest pain at night Murmur on exam - he describes dyspnea with exertion (e.g., climbing stairs) - he also describes somewhat atypical chest pain at night - tums sometimes relieves his DOE - given his known disease with occlusion not amenable to PCI, will try to treat medically - I will obtain an echocardiogram - does have a soft murmur on exam - increase ranexa to 1000 mg BID to see if this helps symptoms - right heart filling pressures noted on heart cath last year - he is on protonix and tagamet  - no fever or chills, recent illness - will hold off on CXR for now - etiology could be CAD, ?valvular disease, or GERD - will hold off on ischemic evaluation for now given stable EKG and stable disease on cath last year   CAD status post CABG 2006 (LIMA-LAD, SVG-RV marginal branch, SVG-OM1) - Recent heart catheterization with patent grafts - Unsuccessful PCI to chronically occluded LCX  - stable disease on heart cath 2021 - continue ranexa and amlodipine, ASA, statin - not on BB  Hyperlipidemia with LDL goal < 70 - Continue 80 mg lipitor 12/2020 Total chol: 118 HDL 35 LDL 61 Trig 124   DM2 - Per PCP   Hypertension - amlodipoine, losartan - BP was controlled this morning   Paroxysmal atrial tachycardia - Noted on telemetry in hospital and heart monitor 2021 - not on a BB, no longer on amiodarone - no palpitations    PAD - ulcer on heel lead to ABIs and dopplers which were abnormal - occluded SFAs bilaterally - he was referred to Dr. Gwenlyn Found and denied claudication, heel wound  healing - invasive workup deferred with close follow up   Obtain echo. Increase ranexa. Follow up with Dr. [name].   Medication Adjustments/Labs and Tests Ordered: Current medicines are reviewed at length with the patient today.  Concerns regarding medicines are outlined above.  Orders Placed This Encounter  Procedures   EKG 12-Lead   ECHOCARDIOGRAM COMPLETE    Meds ordered this encounter  Medications   ranolazine (RANEXA) 1000 MG SR tablet    Sig: Take 1 tablet (1,000 mg total) by mouth 2 (two) times daily.    Dispense:  180 tablet    Refill:  3    Dose change new Rx     Signed, Ledora Bottcher, [place]  03/29/2021 10:33 AM    [facility]

## 2021-03-18 DIAGNOSIS — Z23 Encounter for immunization: Secondary | ICD-10-CM | POA: Diagnosis not present

## 2021-03-18 DIAGNOSIS — S8011XA Contusion of right lower leg, initial encounter: Secondary | ICD-10-CM | POA: Diagnosis not present

## 2021-03-29 ENCOUNTER — Other Ambulatory Visit: Payer: Self-pay

## 2021-03-29 ENCOUNTER — Ambulatory Visit: Payer: Medicare PPO | Admitting: Physician Assistant

## 2021-03-29 ENCOUNTER — Encounter: Payer: Self-pay | Admitting: Physician Assistant

## 2021-03-29 VITALS — BP 152/58 | HR 75 | Ht 70.0 in | Wt 182.4 lb

## 2021-03-29 DIAGNOSIS — R0609 Other forms of dyspnea: Secondary | ICD-10-CM | POA: Diagnosis not present

## 2021-03-29 DIAGNOSIS — I739 Peripheral vascular disease, unspecified: Secondary | ICD-10-CM

## 2021-03-29 DIAGNOSIS — E119 Type 2 diabetes mellitus without complications: Secondary | ICD-10-CM | POA: Diagnosis not present

## 2021-03-29 DIAGNOSIS — E785 Hyperlipidemia, unspecified: Secondary | ICD-10-CM

## 2021-03-29 DIAGNOSIS — R011 Cardiac murmur, unspecified: Secondary | ICD-10-CM | POA: Diagnosis not present

## 2021-03-29 DIAGNOSIS — I10 Essential (primary) hypertension: Secondary | ICD-10-CM | POA: Diagnosis not present

## 2021-03-29 DIAGNOSIS — I25709 Atherosclerosis of coronary artery bypass graft(s), unspecified, with unspecified angina pectoris: Secondary | ICD-10-CM | POA: Diagnosis not present

## 2021-03-29 DIAGNOSIS — I471 Supraventricular tachycardia: Secondary | ICD-10-CM | POA: Diagnosis not present

## 2021-03-29 MED ORDER — RANOLAZINE ER 1000 MG PO TB12: 1000.0000 mg | ORAL_TABLET | Freq: Two times a day (BID) | ORAL | 3 refills | Status: DC

## 2021-03-29 NOTE — Patient Instructions (Addendum)
Medication Instructions:  INCREASE Ranexa to 1000 mg 2 times a day   *If you need a refill on your cardiac medications before your next appointment, please call your pharmacy*  Lab Work: NONE ordered at this time of appointment   If you have labs (blood work) drawn today and your tests are completely normal, you will receive your results only by: [place] (if you have MyChart) OR A paper copy in the mail If you have any lab test that is abnormal or we need to change your treatment, we will call you to review the results.  Testing/Procedures: Your physician has requested that you have an echocardiogram. Echocardiography is a painless test that uses sound waves to create images of your heart. It provides your doctor with information about the size and shape of your heart and how well your heart's chambers and valves are working. This procedure takes approximately one hour. There are no restrictions for this procedure.  Please schedule for 1-2 weeks    Follow-Up: At [facility], you and your health needs are our priority.  As part of our continuing mission to provide you with exceptional heart care, we have created designated Provider Care Teams.  These Care Teams include your primary Cardiologist (physician) and Advanced Practice Providers (APPs -  Physician Assistants and Nurse Practitioners) who all work together to provide you with the care you need, when you need it.  We recommend signing up for the patient portal called "MyChart".  Sign up information is provided on this After Visit Summary.  MyChart is used to connect with patients for Virtual Visits (Telemedicine).  Patients are able to view lab/test results, encounter notes, upcoming appointments, etc.  Non-urgent messages can be sent to your provider as well.   To learn more about what you can do with MyChart, go to [email or web address].    Your next appointment:   1 month(s)  The format for your next appointment:    In Person  Provider:   Peter [name], MD  Other Instructions

## 2021-03-31 ENCOUNTER — Telehealth: Payer: Self-pay

## 2021-03-31 NOTE — Telephone Encounter (Signed)
Spoke with the patient. He was calling for detailed instructions with what was entailed with the echo. He has been given instructions.

## 2021-03-31 NOTE — Telephone Encounter (Signed)
Pt calling stating that he was scheduled for a echo, but he has some questions concerning this procedure and would like for the nurse to give him a call back. Please address

## 2021-04-09 ENCOUNTER — Emergency Department (HOSPITAL_COMMUNITY): Payer: Medicare PPO

## 2021-04-09 ENCOUNTER — Other Ambulatory Visit (HOSPITAL_BASED_OUTPATIENT_CLINIC_OR_DEPARTMENT_OTHER): Payer: Medicare PPO

## 2021-04-09 ENCOUNTER — Encounter (HOSPITAL_COMMUNITY)
Admission: EM | Disposition: A | Discharge status: Home / Self Care | Payer: Self-pay | Source: Home / Self Care | Attending: Emergency Medicine

## 2021-04-09 ENCOUNTER — Encounter (HOSPITAL_COMMUNITY): Payer: Self-pay | Admitting: Emergency Medicine

## 2021-04-09 ENCOUNTER — Other Ambulatory Visit: Payer: Self-pay

## 2021-04-09 ENCOUNTER — Observation Stay (HOSPITAL_COMMUNITY)
Admission: EM | Admit: 2021-04-09 | Discharge: 2021-04-11 | Disposition: A | Discharge status: Home / Self Care | Payer: Medicare PPO | Attending: Cardiology | Admitting: Cardiology

## 2021-04-09 DIAGNOSIS — Z8546 Personal history of malignant neoplasm of prostate: Secondary | ICD-10-CM | POA: Insufficient documentation

## 2021-04-09 DIAGNOSIS — Z7982 Long term (current) use of aspirin: Secondary | ICD-10-CM | POA: Diagnosis not present

## 2021-04-09 DIAGNOSIS — N189 Chronic kidney disease, unspecified: Secondary | ICD-10-CM | POA: Diagnosis not present

## 2021-04-09 DIAGNOSIS — I2510 Atherosclerotic heart disease of native coronary artery without angina pectoris: Secondary | ICD-10-CM | POA: Diagnosis not present

## 2021-04-09 DIAGNOSIS — Z79899 Other long term (current) drug therapy: Secondary | ICD-10-CM | POA: Insufficient documentation

## 2021-04-09 DIAGNOSIS — I214 Non-ST elevation (NSTEMI) myocardial infarction: Secondary | ICD-10-CM

## 2021-04-09 DIAGNOSIS — I129 Hypertensive chronic kidney disease with stage 1 through stage 4 chronic kidney disease, or unspecified chronic kidney disease: Secondary | ICD-10-CM | POA: Insufficient documentation

## 2021-04-09 DIAGNOSIS — E1122 Type 2 diabetes mellitus with diabetic chronic kidney disease: Secondary | ICD-10-CM | POA: Diagnosis not present

## 2021-04-09 DIAGNOSIS — I10 Essential (primary) hypertension: Secondary | ICD-10-CM | POA: Diagnosis present

## 2021-04-09 DIAGNOSIS — Z7984 Long term (current) use of oral hypoglycemic drugs: Secondary | ICD-10-CM | POA: Diagnosis not present

## 2021-04-09 DIAGNOSIS — E7800 Pure hypercholesterolemia, unspecified: Secondary | ICD-10-CM | POA: Diagnosis present

## 2021-04-09 DIAGNOSIS — I249 Acute ischemic heart disease, unspecified: Secondary | ICD-10-CM

## 2021-04-09 DIAGNOSIS — I739 Peripheral vascular disease, unspecified: Secondary | ICD-10-CM | POA: Diagnosis present

## 2021-04-09 DIAGNOSIS — Z951 Presence of aortocoronary bypass graft: Secondary | ICD-10-CM | POA: Diagnosis not present

## 2021-04-09 DIAGNOSIS — Z20822 Contact with and (suspected) exposure to covid-19: Secondary | ICD-10-CM | POA: Diagnosis not present

## 2021-04-09 DIAGNOSIS — Z87891 Personal history of nicotine dependence: Secondary | ICD-10-CM | POA: Insufficient documentation

## 2021-04-09 DIAGNOSIS — R079 Chest pain, unspecified: Secondary | ICD-10-CM | POA: Diagnosis not present

## 2021-04-09 DIAGNOSIS — E119 Type 2 diabetes mellitus without complications: Secondary | ICD-10-CM

## 2021-04-09 DIAGNOSIS — R0789 Other chest pain: Secondary | ICD-10-CM | POA: Diagnosis not present

## 2021-04-09 HISTORY — PX: LEFT HEART CATH AND CORS/GRAFTS ANGIOGRAPHY: CATH118250

## 2021-04-09 LAB — BASIC METABOLIC PANEL
Anion gap: 10 (ref 5–15)
BUN: 13 mg/dL (ref 8–23)
CO2: 29 mmol/L (ref 22–32)
Calcium: 9.3 mg/dL (ref 8.9–10.3)
Chloride: 98 mmol/L (ref 98–111)
Creatinine, Ser: 0.78 mg/dL (ref 0.61–1.24)
GFR, Estimated: >60 mL/min (ref >60)
Glucose, Bld: 234 mg/dL — ABNORMAL HIGH (ref 70–99)
Potassium: 4.4 mmol/L (ref 3.5–5.1)
Sodium: 137 mmol/L (ref 135–145)

## 2021-04-09 LAB — RESP PANEL BY RT-PCR (FLU A&B, COVID) ARPGX2
Influenza A by PCR: NEGATIVE
Influenza B by PCR: NEGATIVE
SARS Coronavirus 2 by RT PCR: NEGATIVE

## 2021-04-09 LAB — CBC
HCT: 35.5 % — ABNORMAL LOW (ref 39.0–52.0)
Hemoglobin: 11.5 g/dL — ABNORMAL LOW (ref 13.0–17.0)
MCH: 31.9 pg (ref 26.0–34.0)
MCHC: 32.4 g/dL (ref 30.0–36.0)
MCV: 98.3 fL (ref 80.0–100.0)
Platelets: 211 10*3/uL (ref 150–400)
RBC: 3.61 MIL/uL — ABNORMAL LOW (ref 4.22–5.81)
RDW: 12.3 % (ref 11.5–15.5)
WBC: 6.2 10*3/uL (ref 4.0–10.5)
nRBC: 0 % (ref 0.0–0.2)

## 2021-04-09 LAB — GLUCOSE, CAPILLARY: Glucose-Capillary: 59 mg/dL — ABNORMAL LOW (ref 70–99)

## 2021-04-09 LAB — TROPONIN I (HIGH SENSITIVITY)
Troponin I (High Sensitivity): 79 ng/L — ABNORMAL HIGH (ref <18)
Troponin I (High Sensitivity): 878 ng/L (ref <18)

## 2021-04-09 LAB — PROTIME-INR
INR: 1 (ref 0.8–1.2)
Prothrombin Time: 13.3 seconds (ref 11.4–15.2)

## 2021-04-09 LAB — APTT: aPTT: 32 seconds (ref 24–36)

## 2021-04-09 SURGERY — LEFT HEART CATH AND CORS/GRAFTS ANGIOGRAPHY
Anesthesia: LOCAL

## 2021-04-09 MED ORDER — LIDOCAINE HCL (PF) 1 % IJ SOLN
INTRAMUSCULAR | Status: AC
  Filled 2021-04-09: qty 30

## 2021-04-09 MED ORDER — SODIUM CHLORIDE 0.9 % WEIGHT BASED INFUSION: 1.0000 mL/kg/h | INTRAVENOUS | Status: AC

## 2021-04-09 MED ORDER — ATORVASTATIN CALCIUM 80 MG PO TABS
80.0000 mg | ORAL_TABLET | Freq: Every day | ORAL | Status: DC
  Administered 2021-04-10: 80 mg via ORAL
  Filled 2021-04-09 (×2): qty 1

## 2021-04-09 MED ORDER — HEPARIN SODIUM (PORCINE) 1000 UNIT/ML IJ SOLN
INTRAMUSCULAR | Status: AC
  Filled 2021-04-09: qty 1

## 2021-04-09 MED ORDER — HEPARIN (PORCINE) IN NACL 1000-0.9 UT/500ML-% IV SOLN
INTRAVENOUS | Status: DC | PRN
  Administered 2021-04-09 (×2): 500 mL

## 2021-04-09 MED ORDER — HEPARIN (PORCINE) 25000 UT/250ML-% IV SOLN
1000.0000 [IU]/h | INTRAVENOUS | Status: DC
  Administered 2021-04-09: 1000 [IU]/h via INTRAVENOUS
  Filled 2021-04-09: qty 250

## 2021-04-09 MED ORDER — HEPARIN BOLUS VIA INFUSION
4000.0000 [IU] | Freq: Once | INTRAVENOUS | Status: AC
  Administered 2021-04-09: 4000 [IU] via INTRAVENOUS
  Filled 2021-04-09: qty 4000

## 2021-04-09 MED ORDER — LIDOCAINE HCL (PF) 1 % IJ SOLN
INTRAMUSCULAR | Status: DC | PRN
  Administered 2021-04-09: 2 mL

## 2021-04-09 MED ORDER — IOHEXOL 350 MG/ML SOLN
INTRAVENOUS | Status: DC | PRN
  Administered 2021-04-09: 90 mL via INTRA_ARTERIAL

## 2021-04-09 MED ORDER — HEPARIN (PORCINE) IN NACL 1000-0.9 UT/500ML-% IV SOLN
INTRAVENOUS | Status: AC
  Filled 2021-04-09: qty 1000

## 2021-04-09 MED ORDER — SODIUM CHLORIDE 0.9% FLUSH
3.0000 mL | Freq: Two times a day (BID) | INTRAVENOUS | Status: DC
  Administered 2021-04-10 – 2021-04-11 (×4): 3 mL via INTRAVENOUS

## 2021-04-09 MED ORDER — ASPIRIN 81 MG PO CHEW
81.0000 mg | CHEWABLE_TABLET | ORAL | Status: AC
Start: 2021-04-10 — End: 2021-04-11

## 2021-04-09 MED ORDER — ASPIRIN 300 MG RE SUPP
300.0000 mg | RECTAL | Status: AC
  Filled 2021-04-09: qty 1

## 2021-04-09 MED ORDER — SODIUM CHLORIDE 0.9% FLUSH: 3.0000 mL | INTRAVENOUS | Status: DC | PRN

## 2021-04-09 MED ORDER — ASPIRIN 81 MG PO CHEW
324.0000 mg | CHEWABLE_TABLET | ORAL | Status: AC
  Administered 2021-04-09: 324 mg via ORAL
  Filled 2021-04-09: qty 4

## 2021-04-09 MED ORDER — SODIUM CHLORIDE 0.9 % IV SOLN: 250.0000 mL | INTRAVENOUS | Status: DC | PRN

## 2021-04-09 MED ORDER — HYDRALAZINE HCL 20 MG/ML IJ SOLN: 10.0000 mg | INTRAMUSCULAR | Status: AC | PRN

## 2021-04-09 MED ORDER — SODIUM CHLORIDE 0.9 % WEIGHT BASED INFUSION
1.0000 mL/kg/h | INTRAVENOUS | Status: DC
  Administered 2021-04-09: 1 mL/kg/h via INTRAVENOUS

## 2021-04-09 MED ORDER — PANTOPRAZOLE SODIUM 40 MG PO TBEC
40.0000 mg | DELAYED_RELEASE_TABLET | Freq: Two times a day (BID) | ORAL | Status: DC
  Administered 2021-04-09 – 2021-04-11 (×4): 40 mg via ORAL
  Filled 2021-04-09 (×4): qty 1

## 2021-04-09 MED ORDER — ONDANSETRON HCL 4 MG/2ML IJ SOLN
4.0000 mg | Freq: Four times a day (QID) | INTRAMUSCULAR | Status: DC | PRN
Start: 2021-04-09 — End: 2021-04-11

## 2021-04-09 MED ORDER — ASPIRIN EC 81 MG PO TBEC
81.0000 mg | DELAYED_RELEASE_TABLET | Freq: Every day | ORAL | Status: DC
  Administered 2021-04-10 – 2021-04-11 (×2): 81 mg via ORAL
  Filled 2021-04-09 (×2): qty 1

## 2021-04-09 MED ORDER — VERAPAMIL HCL 2.5 MG/ML IV SOLN
INTRAVENOUS | Status: AC
  Filled 2021-04-09: qty 2

## 2021-04-09 MED ORDER — RANOLAZINE ER 500 MG PO TB12
500.0000 mg | ORAL_TABLET | Freq: Two times a day (BID) | ORAL | Status: DC
  Administered 2021-04-09 – 2021-04-11 (×4): 500 mg via ORAL
  Filled 2021-04-09 (×4): qty 1

## 2021-04-09 MED ORDER — LOSARTAN POTASSIUM 50 MG PO TABS
100.0000 mg | ORAL_TABLET | Freq: Every day | ORAL | Status: DC
  Administered 2021-04-10 – 2021-04-11 (×2): 100 mg via ORAL
  Filled 2021-04-09 (×2): qty 2

## 2021-04-09 MED ORDER — AMLODIPINE BESYLATE 5 MG PO TABS
7.5000 mg | ORAL_TABLET | Freq: Every day | ORAL | Status: DC
  Administered 2021-04-09 – 2021-04-10 (×2): 7.5 mg via ORAL
  Filled 2021-04-09 (×2): qty 1

## 2021-04-09 MED ORDER — ACETAMINOPHEN 325 MG PO TABS: 650.0000 mg | ORAL_TABLET | ORAL | Status: DC | PRN

## 2021-04-09 MED ORDER — FUROSEMIDE 40 MG PO TABS
40.0000 mg | ORAL_TABLET | Freq: Every day | ORAL | Status: DC
  Administered 2021-04-10 – 2021-04-11 (×2): 40 mg via ORAL
  Filled 2021-04-09 (×2): qty 1

## 2021-04-09 MED ORDER — SODIUM CHLORIDE 0.9% FLUSH
3.0000 mL | Freq: Two times a day (BID) | INTRAVENOUS | Status: DC
  Administered 2021-04-09 – 2021-04-10 (×2): 3 mL via INTRAVENOUS

## 2021-04-09 MED ORDER — LEVOTHYROXINE SODIUM 100 MCG PO TABS
100.0000 ug | ORAL_TABLET | Freq: Every day | ORAL | Status: DC
  Administered 2021-04-10 – 2021-04-11 (×2): 100 ug via ORAL
  Filled 2021-04-09 (×2): qty 1

## 2021-04-09 MED ORDER — VERAPAMIL HCL 2.5 MG/ML IV SOLN
INTRAVENOUS | Status: DC | PRN
  Administered 2021-04-09: 10 mL via INTRA_ARTERIAL

## 2021-04-09 MED ORDER — HEPARIN SODIUM (PORCINE) 1000 UNIT/ML IJ SOLN
INTRAMUSCULAR | Status: DC | PRN
  Administered 2021-04-09: 4000 [IU] via INTRAVENOUS

## 2021-04-09 MED ORDER — HEPARIN SODIUM (PORCINE) 5000 UNIT/ML IJ SOLN
5000.0000 [IU] | Freq: Three times a day (TID) | INTRAMUSCULAR | Status: DC
  Administered 2021-04-10 (×3): 5000 [IU] via SUBCUTANEOUS
  Filled 2021-04-09 (×3): qty 1

## 2021-04-09 MED ORDER — SODIUM CHLORIDE 0.9 % WEIGHT BASED INFUSION
3.0000 mL/kg/h | INTRAVENOUS | Status: AC
  Administered 2021-04-09: 3 mL/kg/h via INTRAVENOUS

## 2021-04-09 SURGICAL SUPPLY — 9 items
CATH INFINITI 5FR MULTPACK ANG (CATHETERS) ×2 IMPLANT
DEVICE RAD COMP TR BAND LRG (VASCULAR PRODUCTS) ×2 IMPLANT
GLIDESHEATH SLEND SS 6F .021 (SHEATH) ×2 IMPLANT
GUIDEWIRE INQWIRE 1.5J.035X260 (WIRE) ×1 IMPLANT
INQWIRE 1.5J .035X260CM (WIRE) ×2
KIT HEART LEFT (KITS) ×4 IMPLANT
PACK CARDIAC CATHETERIZATION (CUSTOM PROCEDURE TRAY) ×2 IMPLANT
TRANSDUCER W/STOPCOCK (MISCELLANEOUS) ×2 IMPLANT
TUBING CIL FLEX 10 FLL-RA (TUBING) ×2 IMPLANT

## 2021-04-09 NOTE — ED Provider Notes (Addendum)
[facility] EMERGENCY DEPARTMENT Provider Note   CSN: [id number] Arrival date & time: 04/09/21  1001     History Chief Complaint  Patient presents with   Chest Pain    Alexander Choi is a 87 y.o. male.   Chest Pain  HPI: A 87 year old patient with a history of treated diabetes, hypertension and hypercholesterolemia presents for evaluation of chest pain. Initial onset of pain was approximately 3-6 hours ago. The patient's chest pain is described as heaviness/pressure/tightness and is not worse with exertion. The patient's chest pain is middle- or left-sided, is not well-localized, is not sharp and does radiate to the arms/jaw/neck. The patient does not complain of nausea and denies diaphoresis. The patient has no history of stroke, has no history of peripheral artery disease, has not smoked in the past 90 days, has no relevant family history of coronary artery disease (first degree relative at less than age 58) and does not have an elevated BMI (>=30).  Pt tried taking rolaids.  He thought it could be indigestion, it did not help.  Eased off now. Past Medical History:  Diagnosis Date   Cancer [facility])    Chronic kidney disease    kidney stones   Coronary artery disease    with CABG in 2006 with LIMA to LAD, SVG to mid and distal OM, SVG to AM. Normal Myoview in May of 2013   Diabetes mellitus    Type 2   GERD (gastroesophageal reflux disease)    Hematuria    Hypercholesterolemia    Hypertension    Irritable bowel syndrome    Loose stools    Prostate cancer [facility])    Urinary obstruction     Patient Active Problem List   Diagnosis Date Noted   Peripheral arterial disease ([place]) 10/16/2020   Atrial tachycardia ([place]) 12/16/2019   Bradycardia 12/16/2019   Unstable angina ([place]) 12/12/2019   Coronary artery disease    Hypertension    Hypercholesterolemia    Diabetes mellitus ([place])     Past Surgical History:  Procedure Laterality Date   CHOLECYSTECTOMY      CORONARY ARTERY BYPASS GRAFT  2006   LEFT HEART CATHETERIZATION WITH CORONARY/GRAFT ANGIOGRAM N/A 04/11/2014   Procedure: LEFT HEART CATHETERIZATION WITH [name];  Surgeon: Peter M [name], MD;  Location: [facility] CATH LAB;  Service: Cardiovascular;  Laterality: N/A;   OTHER SURGICAL HISTORY     bowel obstruction   RADIOACTIVE SEED IMPLANT     for prostate cancer   RIGHT/LEFT HEART CATH AND CORONARY/GRAFT ANGIOGRAPHY N/A 12/12/2019   Procedure: RIGHT/LEFT HEART CATH AND CORONARY/GRAFT ANGIOGRAPHY;  Surgeon: [name], Peter M, MD;  Location: [place] CV LAB;  Service: Cardiovascular;  Laterality: N/A;   SKIN CANCER EXCISION     TEMPORARY PACEMAKER N/A 12/13/2019   Procedure: TEMPORARY PACEMAKER;  Surgeon: [name], Peter M, MD;  Location: [place] CV LAB;  Service: Cardiovascular;  Laterality: N/A;       Family History  Problem Relation Age of Onset   Cancer Mother        Leukemia   Hypertension Mother    Cancer Father        Colon   Heart attack Father        Had Several in his 81's   Hypertension Father    Diabetes Father     Social History   Tobacco Use   Smoking status: Former    Packs/day: 3.00    Types: Cigarettes   Smokeless tobacco:  Never  Vaping Use   Vaping Use: Never used  Substance Use Topics   Alcohol use: No   Drug use: No    Home Medications Prior to Admission medications   Medication Sig Start Date End Date Taking? Authorizing Provider  Accu-Chek Softclix Lancets lancets  07/04/19   [provider]  Accu-Chek Softclix Lancets lancets  12/23/18   [provider]  albuterol (VENTOLIN HFA) 108 (90 Base) MCG/ACT inhaler Inhale 2 puffs into the lungs every 6 (six) hours as needed for wheezing or shortness of breath. 10/01/20   [name], Peter M, MD  amLODipine (NORVASC) 5 MG tablet Take 5 mg by mouth at bedtime. 05/20/20   [provider]  aspirin 81 MG tablet Take 81 mg by mouth daily.    [provider]   atorvastatin (LIPITOR) 80 MG tablet Take 1 tablet (80 mg total) by mouth daily. 12/16/19   Duke, Tami Lin, PA  cimetidine (TAGAMET) 400 MG tablet Take 400 mg by mouth daily as needed (heartburn).     [provider]  ferrous sulfate 325 (65 FE) MG EC tablet Take 1 tablet (325 mg total) by mouth in the morning and at bedtime. 10/01/20   [name], Peter M, MD  finasteride (PROSCAR) 5 MG tablet  05/18/20   [provider]  furosemide (LASIX) 40 MG tablet Take 1 tablet (40 mg total) by mouth daily. 10/01/20   [name], Peter M, MD  gabapentin (NEURONTIN) 100 MG capsule Take 1 capsule (100 mg total) by mouth 3 (three) times daily. 10/01/20   [name], Peter M, MD  glimepiride (AMARYL) 4 MG tablet Take 4 mg by mouth daily with breakfast. 10/01/20   [name], Peter M, MD  glucose blood (ACCU-CHEK AVIVA PLUS) test strip  07/03/17   [provider]  levothyroxine (SYNTHROID) 100 MCG tablet Take 1 tablet (100 mcg total) by mouth daily before breakfast. 10/01/20   [name], Peter M, MD  losartan (COZAAR) 100 MG tablet Take 100 mg by mouth daily. 05/31/18   [provider]  meclizine (ANTIVERT) 25 MG tablet Take 1 tablet (25 mg total) by mouth 3 (three) times daily as needed for dizziness. 10/01/20   [name], Peter M, MD  metFORMIN (GLUCOPHAGE) 500 MG tablet Take 1 tablet (500 mg total) by mouth 2 (two) times daily with a meal. Patient taking differently: Take 500 mg by mouth 2 (two) times daily with a meal. Pt takes 1 tablet daily. 12/16/19   Duke, Tami Lin, PA  Multiple Vitamins-Minerals (ICAPS AREDS 2 PO) Take 1 Cartridge by mouth in the morning and at bedtime.    [provider]  nitroGLYCERIN (NITROSTAT) 0.4 MG SL tablet Place 1 tablet (0.4 mg total) under the tongue every 5 (five) minutes as needed for chest pain. 06/05/15   [name], Peter M, MD  NONFORMULARY OR COMPOUNDED [place]:  Peripheral Neuropathy Cream - Bupivacaine 1%, doxepin 3%, Gabapentin 6%,  Pentoxifylline 3%, Topiramate 1%, apply 1-2 grams to affected area 3-4 times daily. Patient taking differently: [facility]:  Peripheral Neuropathy Cream - Bupivacaine 1%, doxepin 3%, Gabapentin 6%, Pentoxifylline 3%, Topiramate 1%, apply 1-2 grams to affected area 3-4 times daily. 02/25/16   Stover, Titorya, DPM  ondansetron (ZOFRAN) 4 MG tablet Take 1 tablet (4 mg total) by mouth every 8 (eight) hours as needed for nausea or vomiting. 10/01/20   [name], Peter M, MD  OVER THE COUNTER MEDICATION Take 2 capsules by mouth daily. Nerverenew Neuropathy Support Formula (B1 & B12  Vitamins)    [provider]  pantoprazole (PROTONIX) 40 MG tablet Take 1 tablet (40 mg total) by mouth daily. Patient taking differently: Take 40 mg by mouth 2 (two) times daily. 09/17/19 01/15/20  Noemi Chapel, MD  polyethylene glycol powder (GLYCOLAX/MIRALAX) 17 GM/SCOOP powder Take 255 g by mouth daily. 10/01/20   [name], Peter M, MD  ranolazine (RANEXA) 1000 MG SR tablet Take 1 tablet (1,000 mg total) by mouth 2 (two) times daily. 03/29/21   Duke, Tami Lin, PA  vitamin B-12 (CYANOCOBALAMIN) 1000 MCG tablet Take 1 tablet (1,000 mcg total) by mouth daily. 10/01/20   [name], Peter M, MD    Allergies    Ace inhibitors; Nitrates, organic; Shellfish allergy; and Penicillins  Review of Systems   Review of Systems  Cardiovascular:  Positive for chest pain.  All other systems reviewed and are negative.  Physical Exam Updated Vital Signs BP 137/62   Pulse 66   Temp 98.4 F (36.9 C) (Oral)   Resp 13   Ht 1.778 m (5\' 10" )   Wt 82.7 kg   SpO2 100%   BMI 26.16 kg/m   Physical Exam Vitals and nursing note reviewed.  Constitutional:      General: He is not in acute distress.    Appearance: He is well-developed.  HENT:     Head: Normocephalic and atraumatic.     Right Ear: External ear normal.     Left Ear: External ear normal.  Eyes:     General: No scleral icterus.       Right eye: No discharge.         Left eye: No discharge.     Conjunctiva/sclera: Conjunctivae normal.  Neck:     Trachea: No tracheal deviation.  Cardiovascular:     Rate and Rhythm: Normal rate and regular rhythm.  Pulmonary:     Effort: Pulmonary effort is normal. No respiratory distress.     Breath sounds: Normal breath sounds. No stridor. No wheezing or rales.  Abdominal:     General: Bowel sounds are normal. There is no distension.     Palpations: Abdomen is soft.     Tenderness: There is no abdominal tenderness. There is no guarding or rebound.  Musculoskeletal:        General: No tenderness or deformity.     Cervical back: Neck supple.  Skin:    General: Skin is warm and dry.     Findings: No rash.  Neurological:     General: No focal deficit present.     Mental Status: He is alert.     Cranial Nerves: No cranial nerve deficit (no facial droop, extraocular movements intact, no slurred speech).     Sensory: No sensory deficit.     Motor: No abnormal muscle tone or seizure activity.     Coordination: Coordination normal.  Psychiatric:        Mood and Affect: Mood normal.    ED Results / Procedures / Treatments   Labs (all labs ordered are listed, but only abnormal results are displayed) Labs Reviewed  BASIC METABOLIC PANEL - Abnormal; Notable for the following components:      Result Value   Glucose, Bld 234 (*)    All other components within normal limits  CBC - Abnormal; Notable for the following components:   RBC 3.61 (*)    Hemoglobin 11.5 (*)    HCT 35.5 (*)    All other components within normal limits  TROPONIN I (HIGH  SENSITIVITY) - Abnormal; Notable for the following components:   Troponin I (High Sensitivity) 79 (*)    All other components within normal limits  RESP PANEL BY RT-PCR (FLU A&B, COVID) ARPGX2  APTT  PROTIME-INR  HEPARIN LEVEL (UNFRACTIONATED)  TROPONIN I (HIGH SENSITIVITY)    EKG EKG Interpretation  Date/Time:  Friday April 09 2021 10:04:53 EDT Ventricular Rate:   73 PR Interval:  242 QRS Duration: 96 QT Interval:  384 QTC Calculation: 423 R Axis:   36 Text Interpretation: Sinus rhythm with 1st degree A-V block Otherwise normal ECG No significant change since last tracing Confirmed by Dorie Rank [phone]) on 04/09/2021 12:10:21 PM  Radiology DG Chest 2 View  Result Date: 04/09/2021 CLINICAL DATA:  Sudden onset of chest pain EXAM: CHEST - 2 VIEW COMPARISON:  01/08/2020 FINDINGS: Chronic elevation of the left diaphragm. Chronic generalized interstitial coarsening. Normal heart size and stable mediastinal contours. CABG. There is no edema, consolidation, effusion, or pneumothorax. IMPRESSION: Stable from prior.  No evidence of acute disease. Electronically Signed   By: Jorje Guild M.D.   On: 04/09/2021 10:47    Procedures Procedures   Medications Ordered in ED Medications  heparin bolus via infusion 4,000 Units (has no administration in time range)  heparin ADULT infusion 100 units/mL (25000 units/275mL) (has no administration in time range)    ED Course  I have reviewed the triage vital signs and the nursing notes.  Pertinent labs & imaging results that were available during my care of the patient were reviewed by me and considered in my medical decision making (see chart for details).    MDM Rules/Calculators/A&P HEAR Score: 5                         Patient presents to the ED with complaints of chest pain.  Patient has known history of coronary artery disease.  He also has history of acid reflux and he was not sure if this was his indigestion.  Patient was given aspirin by EMS and his symptoms have improved.  Patient symptoms were concerning for the possibility of acute coronary syndrome.  He does have an elevated troponin.  Currently he is pain-free but I am concerned about acute coronary syndrome and will consult with cardiology service.  Patient was given aspirin earlier.  I will order heparin. Final Clinical Impression(s) / ED  Diagnoses Final diagnoses:  ACS (acute coronary syndrome) (HCC)        Dorie Rank, MD 04/09/21 1223

## 2021-04-09 NOTE — Progress Notes (Signed)
ANTICOAGULATION CONSULT NOTE - Initial Consult  Pharmacy Consult for heparin Indication: chest pain/ACS  Allergies  Allergen Reactions   Ace Inhibitors Other (See Comments)    unknown   Nitrates, Organic Swelling    Swelling in hands   Shellfish Allergy Nausea Only    Pt states scallops-not all shellfish-GI symptoms/ nausea -50+ years ago   Penicillins Rash    Patient Measurements: Height: 5\' 10"  (177.8 cm) Weight: 82.7 kg (182 lb 5.1 oz) IBW/kg (Calculated) : 73 Heparin Dosing Weight: 82.7 kg  Vital Signs: Temp: 98.4 F (36.9 C) (10/14 1006) Temp Source: Oral (10/14 1006) BP: 137/62 (10/14 1200) Pulse Rate: 66 (10/14 1200)  Labs: Recent Labs    04/09/21 1004  HGB 11.5*  HCT 35.5*  PLT 211  CREATININE 0.78  TROPONINIHS 79*    Estimated Creatinine Clearance: 68.4 mL/min (by C-G formula based on SCr of 0.78 mg/dL).   Medical History: Past Medical History:  Diagnosis Date   Cancer [facility])    Chronic kidney disease    kidney stones   Coronary artery disease    with CABG in 2006 with LIMA to LAD, SVG to mid and distal OM, SVG to AM. Normal Myoview in May of 2013   Diabetes mellitus    Type 2   GERD (gastroesophageal reflux disease)    Hematuria    Hypercholesterolemia    Hypertension    Irritable bowel syndrome    Loose stools    Prostate cancer ([place])    Urinary obstruction     Medications: see MAR  Assessment: 87 yo M with chest wall pain radiating to L arm with numbness and SOB. Troponin elevated to 74. Heparin consult for [facility]. No AC PTA. CBC wnl.   Goal of Therapy:  Heparin level 0.3-0.7 units/ml Monitor platelets by anticoagulation protocol: Yes   Plan:  Give 4000 units bolus x 1 Start heparin infusion at 1000 units/hr Check anti-Xa level in 8 hours and daily while on heparin Continue to monitor H&H and platelets  Joetta Manners, PharmD, [facility] Emergency Medicine Clinical Pharmacist ED RPh Phone: [place]: [phone]

## 2021-04-09 NOTE — H&P (Addendum)
Cardiology Admission History and Physical:   Patient ID: Alexander Choi MRN: [id number]; DOB: [birth year]   Admission date: 04/09/2021  PCP:  Nicoletta Dress, MD   [facility] Providers Cardiologist:  Alexander [name], MD   [place] specialist: Alexander Choi  Chief Complaint:  Chest pain  Patient Profile:   Alexander Choi is a 87 y.o. male with CAD s/p CABG 2006, HTN, HLD, DM II and PAD who is being seen 04/09/2021 for the evaluation of chest pain.  History of Present Illness:   Alexander Choi is a 87 year old male with past medical history of CAD s/p CABG 2006, HTN, HLD, DM II and PAD.  Myoview in October 2015 showed inferior ischemia.  A subsequent cardiac catheterization showed patent grafts with severe disease in the native left circumflex that predated his CABG.  EGD in December 2020 showed mild inflammation.  Last cardiac catheterization performed on 12/12/2019 for chest pain revealed a complex three-vessel obstructive CAD with patent LIMA to LAD with severe 90% distal LAD lesion that was small in caliber and not suitable for PCI, patent SVG to OM1, patent SVG to RV marginal branch, occluded proximal to distal RCA was left to right collaterals from left circumflex to distal RCA, 99% proximal left circumflex lesion with 90% proximal to mid left circumflex lesion.  The left circumflex was heavily calcified.  Patient returned to the Cath Lab on the following day for staged intervention of PCI.  Unfortunately, this was unsuccessful due to inability to cross the lesion with a wire.  He was seen by Alexander Choi in early 2022 for abnormal lower extremity Doppler suggestive of occluded SFAs, however given lack of claudication symptoms, invasive intervention was deferred, 26-month follow-up was recommended.  More recently, patient was seen by Alexander Sharp PA-C on 04/25/2021 for 87-month history of shortness of breath with exertion and 3 times per week nightly episode of chest pain.  He mentions he mainly  feels chest pain when he is trying to put on his clothing.  Given the previously known operable left circumflex lesion, medical therapy was recommended.  His Ranexa was increased to 1000 mg twice a day.  Unfortunately he had significant dizziness on the higher dose of Ranexa and went back to the lower dose after 2 days.  Echocardiogram has been recommended.   Patient had worst episode of left-sided dull chest pain radiating down the left arm around 8:15 AM this morning.  Symptoms lasted for about an hour and eventually went away by 9:15 AM.  This prompted the patient to seek medical attention at [facility].  Hemoglobin 11.5.  Normal renal function and electrolyte.  EKG showed normal sinus rhythm, no significant ST-T wave changes.  Unchanged when compared to the previous EKG.  Chest x-ray normal.  High-sensitivity troponin borderline elevated to 79, second troponin is currently pending.  Talking with the patient, he is currently chest pain-free.  He did mentions he is blood pressure is usually high whenever he has chest pain.  Systolic blood pressure could be as high as 180s in the setting of chest pain.  Interestingly, he was able to use a broom to clean out his driveway for 4-0/9 hours yesterday without any exertional chest discomfort.   Past Medical History:  Diagnosis Date   Cancer [facility])    Chronic kidney disease    kidney stones   Coronary artery disease    with CABG in 2006 with LIMA to LAD, SVG to mid and distal OM, SVG  to AM. Normal Myoview in May of 2013   Diabetes mellitus    Type 2   GERD (gastroesophageal reflux disease)    Hematuria    Hypercholesterolemia    Hypertension    Irritable bowel syndrome    Loose stools    Prostate cancer ([place])    Urinary obstruction     Past Surgical History:  Procedure Laterality Date   CHOLECYSTECTOMY     CORONARY ARTERY BYPASS GRAFT  2006   LEFT HEART CATHETERIZATION WITH CORONARY/GRAFT ANGIOGRAM N/A 04/11/2014   Procedure: LEFT HEART  CATHETERIZATION WITH [name];  Surgeon: Alexander M [name], MD;  Location: [facility] CATH LAB;  Service: Cardiovascular;  Laterality: N/A;   OTHER SURGICAL HISTORY     bowel obstruction   RADIOACTIVE SEED IMPLANT     for prostate cancer   RIGHT/LEFT HEART CATH AND CORONARY/GRAFT ANGIOGRAPHY N/A 12/12/2019   Procedure: RIGHT/LEFT HEART CATH AND CORONARY/GRAFT ANGIOGRAPHY;  Surgeon: Choi, Alexander M, MD;  Location: [place] CV LAB;  Service: Cardiovascular;  Laterality: N/A;   SKIN CANCER EXCISION     TEMPORARY PACEMAKER N/A 12/13/2019   Procedure: TEMPORARY PACEMAKER;  Surgeon: Choi, Alexander M, MD;  Location: [place] CV LAB;  Service: Cardiovascular;  Laterality: N/A;     Medications Prior to Admission: Prior to Admission medications   Medication Sig Start Date End Date Taking? Authorizing Provider  albuterol (VENTOLIN HFA) 108 (90 Base) MCG/ACT inhaler Inhale 2 puffs into the lungs every 6 (six) hours as needed for wheezing or shortness of breath. 10/01/20  Yes Choi, Alexander M, MD  amLODipine (NORVASC) 5 MG tablet Take 5 mg by mouth at bedtime. 05/20/20  Yes [provider]  aspirin 81 MG tablet Take 81 mg by mouth daily.   Yes [provider]  atorvastatin (LIPITOR) 80 MG tablet Take 1 tablet (80 mg total) by mouth daily. 12/16/19  Yes Duke, Tami Lin, PA  famotidine (PEPCID) 40 MG tablet Take 40 mg by mouth daily. 03/22/21  Yes [provider]  finasteride (PROSCAR) 5 MG tablet Take 5 mg by mouth daily. 05/18/20  Yes [provider]  furosemide (LASIX) 40 MG tablet Take 1 tablet (40 mg total) by mouth daily. 10/01/20  Yes Choi, Alexander M, MD  gabapentin (NEURONTIN) 100 MG capsule Take 100 mg by mouth 2 (two) times daily. 10/01/20  Yes Choi, Alexander M, MD  glimepiride (AMARYL) 4 MG tablet Take 4 mg by mouth daily with breakfast. 10/01/20  Yes Choi, Alexander M, MD  levothyroxine (SYNTHROID) 100 MCG tablet Take 1 tablet (100 mcg total) by mouth daily  before breakfast. 10/01/20  Yes Choi, Alexander M, MD  losartan (COZAAR) 100 MG tablet Take 100 mg by mouth daily. 05/31/18  Yes [provider]  meclizine (ANTIVERT) 25 MG tablet Take 1 tablet (25 mg total) by mouth 3 (three) times daily as needed for dizziness. 10/01/20  Yes Choi, Alexander M, MD  metFORMIN (GLUCOPHAGE) 500 MG tablet Take 1 tablet (500 mg total) by mouth 2 (two) times daily with a meal. Patient taking differently: Take 500 mg by mouth daily. 12/16/19  Yes Duke, Tami Lin, PA  Multiple Vitamins-Minerals (ICAPS AREDS 2 PO) Take 1 Cartridge by mouth in the morning and at bedtime.   Yes [provider]  nitroGLYCERIN (NITROSTAT) 0.4 MG SL tablet Place 1 tablet (0.4 mg total) under the tongue every 5 (five) minutes as needed for chest pain. 06/05/15  Yes Choi, Alexander M, MD  ondansetron (ZOFRAN) 4 MG  tablet Take 1 tablet (4 mg total) by mouth every 8 (eight) hours as needed for nausea or vomiting. 10/01/20  Yes Choi, Alexander M, MD  pantoprazole (PROTONIX) 40 MG tablet Take 1 tablet (40 mg total) by mouth daily. Patient taking differently: Take 40 mg by mouth 2 (two) times daily. 09/17/19 04/09/21 Yes Noemi Chapel, MD  ranolazine (RANEXA) 1000 MG SR tablet Take 1 tablet (1,000 mg total) by mouth 2 (two) times daily. Patient taking differently: Take 500 mg by mouth 2 (two) times daily. 03/29/21  Yes Duke, Tami Lin, PA  vitamin B-12 (CYANOCOBALAMIN) 1000 MCG tablet Take 1 tablet (1,000 mcg total) by mouth daily. 10/01/20  Yes Choi, Alexander M, MD  Wheat Dextrin (BENEFIBER PO) Take 1 tablet by mouth daily.   Yes [provider]  Accu-Chek Softclix Lancets lancets  07/04/19   [provider]  Accu-Chek Softclix Lancets lancets  12/23/18   [provider]  glucose blood (ACCU-CHEK AVIVA PLUS) test strip  07/03/17   [provider]  NONFORMULARY OR COMPOUNDED [place]:  Peripheral Neuropathy Cream - Bupivacaine 1%, doxepin 3%, Gabapentin  6%, Pentoxifylline 3%, Topiramate 1%, apply 1-2 grams to affected area 3-4 times daily. Patient taking differently: [facility]:  Peripheral Neuropathy Cream - Bupivacaine 1%, doxepin 3%, Gabapentin 6%, Pentoxifylline 3%, Topiramate 1%, apply 1-2 grams to affected area 3-4 times daily. 02/25/16   Landis Martins, DPM     Allergies:    Allergies  Allergen Reactions   Ace Inhibitors Other (See Comments)    unknown   Nitrates, Organic Swelling    Swelling in hands   Shellfish Allergy Nausea Only    Pt states scallops-not all shellfish-GI symptoms/ nausea -50+ years ago   Penicillins Rash    Social History:   Social History   Socioeconomic History   Marital status: Widowed    Spouse name: Not on file   Number of children: 3   Years of education: Not on file   Highest education level: Not on file  Occupational History   Occupation: state trooper    Comment: retired   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  Tobacco Use   Smoking status: Former    Packs/day: 3.00    Types: Cigarettes   Smokeless tobacco: Never  Vaping Use   Vaping Use: Never used  Substance and Sexual Activity   Alcohol use: No   Drug use: No   Sexual activity: Not Currently  Other Topics Concern   Not on file  Social History Narrative   Not on file   Social Determinants of Health   Financial Resource Strain: Not on file  Food Insecurity: Not on file  Transportation Needs: Not on file  Physical Activity: Not on file  Stress: Not on file  Social Connections: Not on file  Intimate Partner Violence: Not on file    Family History:   The patient's family history includes Cancer in his father and mother; Diabetes in his father; Heart attack in his father; Hypertension in his father and mother.    ROS:  Please see the history of present illness.  All other ROS reviewed and negative.     Physical Exam/Data:   Vitals:   04/09/21 1200 04/09/21 1250 04/09/21 1300 04/09/21 1330  BP: 137/62 (!) 158/53 (!) 148/62  (!) 162/63  Pulse: 66 62 63 61  Resp: 13 15 13 12   Temp:      TempSrc:      SpO2: 100% 100% 100% 100%  Weight:  82.7 kg     Height: 5\' 10"  (1.778 m)      No intake or output data in the 24 hours ending 04/09/21 1403 Last 3 Weights 04/09/2021 03/29/2021 10/16/2020  Weight (lbs) 182 lb 5.1 oz 182 lb 6.4 oz 171 lb  Weight (kg) 82.7 kg 82.736 kg 77.565 kg     Body mass index is 26.16 kg/m.  General:  Well nourished, well developed, in no acute distress HEENT: normal Neck: no JVD Vascular: No carotid bruits; Distal pulses 2+ bilaterally   Cardiac:  normal S1, S2; RRR; no murmur  Lungs:  clear to auscultation bilaterally, no wheezing, rhonchi or rales  Abd: soft, nontender, no hepatomegaly  Ext: no edema Musculoskeletal:  No deformities, BUE and BLE strength normal and equal Skin: warm and dry  Neuro:  CNs 2-12 intact, no focal abnormalities noted Psych:  Normal affect    EKG:  The ECG that was done and was personally reviewed and demonstrates NSR without significant ST-T wave changes  Relevant CV Studies:  Cath 12/12/2019 Mid LM lesion is 35% stenosed. Ost LAD to Mid LAD lesion is 100% stenosed. Prox Cx lesion is 99% stenosed. Prox Cx to Mid Cx lesion is 90% stenosed. 1st Mrg lesion is 100% stenosed. 2nd Mrg lesion is 75% stenosed. Prox RCA to Dist RCA lesion is 100% stenosed. LIMA graft was visualized by angiography and is normal in caliber. The graft exhibits no disease. Dist LAD-1 lesion is 60% stenosed. Dist LAD-2 lesion is 90% stenosed. SVG graft was visualized by angiography and is large. The graft exhibits minimal luminal irregularities. SVG graft was visualized by angiography and is normal in caliber. The graft exhibits mild . The left ventricular systolic function is normal. LV end diastolic pressure is normal. The left ventricular ejection fraction is 55-65% by visual estimate.   1. Complex 3 vessel obstructive CAD.  2. Patent LIMA to the LAD. There is severe  disease in the distal LAD. The vessel is small in caliber and not suitable for PCI 3. Patent SVG to OM1 4. Patent SVG to RV marginal branch. The RCA is occluded with left to right collaterals from the LCx to the distal RCA 5. Normal LV function 6. Normal LV filling pressures 7. Normal right heart pressures   Plan: The native LCX is severely diseased proximally and in the mid vessel with aneurysmal dilation and heavy calcification. This supplies the second and third OM branches and collaterals to the distal RCA. This is a potential target for intervention. It will require atherectomy. Given progressive symptoms will admit to telemetry. Start IV heparin. Load with Plavix. Planned staged complex PCI of the LCx tomorrow.     Cath 12/13/2019 Dist LAD-1 lesion is 60% stenosed. Prox Cx lesion is 99% stenosed. Prox Cx to Mid Cx lesion is 90% stenosed. 2nd Mrg lesion is 75% stenosed. Unsuccessful PCI due to inability to cross with a wire.   1. Unsuccessful PCI of the LCx due to inability to cross the lesion with a wire.    Plan: medical therapy. ASA only. Will increase amlodipine to 5 mg daily. Consider Ranexa if symptoms persists. Anticipate DC tomorrow am.   Laboratory Data:  High Sensitivity Troponin:   Recent Labs  Lab 04/09/21 1004 04/09/21 1204  TROPONINIHS 79* 878*      Chemistry Recent Labs  Lab 04/09/21 1004  NA 137  K 4.4  CL 98  CO2 29  GLUCOSE 234*  BUN 13  CREATININE 0.78  CALCIUM 9.3  GFRNONAA >60  ANIONGAP 10    No results for input(s): PROT, ALBUMIN, AST, ALT, ALKPHOS, BILITOT in the last 168 hours. Lipids No results for input(s): CHOL, TRIG, HDL, LABVLDL, LDLCALC, CHOLHDL in the last 168 hours. Hematology Recent Labs  Lab 04/09/21 1004  WBC 6.2  RBC 3.61*  HGB 11.5*  HCT 35.5*  MCV 98.3  MCH 31.9  MCHC 32.4  RDW 12.3  PLT 211   Thyroid No results for input(s): TSH, FREET4 in the last 168 hours. BNPNo results for input(s): BNP, PROBNP in the last  168 hours.  DDimer No results for input(s): DDIMER in the last 168 hours.   Radiology/Studies:  DG Chest 2 View  Result Date: 04/09/2021 CLINICAL DATA:  Sudden onset of chest pain EXAM: CHEST - 2 VIEW COMPARISON:  01/08/2020 FINDINGS: Chronic elevation of the left diaphragm. Chronic generalized interstitial coarsening. Normal heart size and stable mediastinal contours. CABG. There is no edema, consolidation, effusion, or pneumothorax. IMPRESSION: Stable from prior.  No evidence of acute disease. Electronically Signed   By: Jorje Guild M.D.   On: 04/09/2021 10:47     Assessment and Plan:   NSTEMI  -Cardiac catheterizations in 2015 and 2021 showed a patent grafts with severe stenosis seen left circumflex artery.  A staged PCI was performed on 12/13/2019, however procedure was unsuccessful due to inability to cross the lesion with a wire  -Started having recurrent chest pain and worsening dyspnea for the past 61-month.  Initially attributed to acid reflux.  Seen in the cardiology office on 03/28/2021, given known left circumflex occlusion not amenable to PCI, was recommended to treat medically.  Ranexa was increased, however patient was unable to tolerate higher dose of Ranexa and he complained of increased dizziness.   -Had 1 hour of chest pain this morning from 8:15AM until 9:15AM. First trop 79, second trop 878. Plan for cath this afternoon.  CAD s/p CABG 2006: ASA, lipitor 80mg  daily. Amlodipine and Ranexa  Hypertension: on amlodipine and losartan.   Hyperlipidemia: on lipitor  DM2   Risk Assessment/Risk Scores:    TIMI Risk Score for Unstable Angina or Non-ST Elevation MI:   The patient's TIMI risk score is 6, which indicates a 41% risk of all cause mortality, new or recurrent myocardial infarction or need for urgent revascularization in the next 14 days.       Severity of Illness: The appropriate patient status for this patient is OBSERVATION. Observation status is judged to  be reasonable and necessary in order to provide the required intensity of service to ensure the patient's safety. The patient's presenting symptoms, physical exam findings, and initial radiographic and laboratory data in the context of their medical condition is felt to place them at decreased risk for further clinical deterioration. Furthermore, it is anticipated that the patient will be medically stable for discharge from the hospital within 2 midnights of admission.    For questions or updates, please contact Rebecca Please consult [email or web address] for contact info under     Signed, Almyra Deforest, [place]  04/09/2021 2:03 PM   I have personally seen and examined this patient. I agree with the assessment and plan as outlined above.  He is a pleasant 87 yo male with history of CAD s/p CABG, HTN, HLD, DM and PAD admitted with chest pain. Last cath in 2021 with 3 patent grafts but severe disease in the Circumflex that was not protected by the graft. Attempted PCI not successful. He has done well over  the last 15 months. He began to have chest pain this am and presented to the ED where his troponin is found to be elevated. (79-->878). EKG without ischemic changes. I have personally reviewed the EKG and is shows sinus with no ischemic changes. Labs reviewed.  He is now chest pain free.   General: Well developed, well nourished, NAD  HEENT: OP clear, mucus membranes moist  SKIN: warm, dry. No rashes. Neuro: No focal deficits  Musculoskeletal: Muscle strength 5/5 all ext  Psychiatric: Mood and affect normal  Neck: No JVD, no carotid bruits, no thyromegaly, no lymphadenopathy.  Lungs:Clear bilaterally, no wheezes, rhonci, crackles Cardiovascular: Regular rate and rhythm. No murmurs, gallops or rubs. Abdomen:Soft. Bowel sounds present. Non-tender.  Extremities: No lower extremity edema. Pulses are 2 + in the bilateral DP/PT.  Plan: NSTEMI: Elderly male with known CAD with prior CABG now presenting with  symptoms concerning for unstable angina. Troponin is elevated c/w NSTEMI. I think cardiac cath is indicated.  I have reviewed the risks, indications, and alternatives to cardiac catheterization, possible angioplasty, and stenting with the patient. Risks include but are not limited to bleeding, infection, vascular injury, stroke, myocardial infection, arrhythmia, kidney injury, radiation-related injury in the case of prolonged fluoroscopy use, emergency cardiac surgery, and death. The patient understands the risks of serious complication is 1-2 in 9678 with diagnostic cardiac cath and 1-2% or less with angioplasty/stenting.  -Will proceed with cath today -Continue IV heparin until cath -Continue ASA and statin.   Lauree Chandler 04/09/2021 2:14 PM

## 2021-04-09 NOTE — Interval H&P Note (Signed)
History and Physical Interval Note:  04/09/2021 4:20 PM  Alexander Choi  has presented today for surgery, with the diagnosis of nstemi.  The various methods of treatment have been discussed with the patient and family. After consideration of risks, benefits and other options for treatment, the patient has consented to  Procedure(s): LEFT HEART CATH AND CORS/GRAFTS ANGIOGRAPHY (N/A) as a surgical intervention.  The patient's history has been reviewed, patient examined, no change in status, stable for surgery.  I have reviewed the patient's chart and labs.  Questions were answered to the patient's satisfaction.   Cath Lab Visit (complete for each Cath Lab visit)  Clinical Evaluation Leading to the Procedure:   ACS: Yes.    Non-ACS:    Anginal Classification: CCS IV  Anti-ischemic medical therapy: Maximal Therapy (2 or more classes of medications)  Non-Invasive Test Results: No non-invasive testing performed  Prior CABG: Previous CABG        [name] 04/09/2021 4:20 PM

## 2021-04-09 NOTE — ED Triage Notes (Signed)
Pt to triage via [facility] from home.  Reports sudden onset of L sided chest pain and L arm pain @ 8am.  Denies SOB, nausea, and vomiting.  20g IV RAC.  ASA 324mg  given PTA.  Pain decreased from 5/10-2/10.

## 2021-04-09 NOTE — ED Provider Notes (Signed)
Emergency Medicine Provider Triage Evaluation Note  YADIER BRAMHALL , a 87 y.o. male  was evaluated in triage.  Pt complains of left-sided chest wall pain that radiates into his left arm with numbness in the arm and shortness of breath that started at 8 AM.  Chest pain-free at this time but continues to have tightness in the left arm and some shortness of breath.  No nausea or vomiting.  History of coronary artery disease, not anticoagulated..  Review of Systems  Positive: Radiating chest pain, shortness of breath Negative: Palpitations, nausea, vomiting, syncope  Physical Exam  BP (!) 160/69 (BP Location: Right Arm)   Pulse 76   Temp 98.4 F (36.9 C) (Oral)   Resp 16   SpO2 98%  Gen:   Awake, no distress   Resp:  Normal effort  MSK:   Moves extremities without difficulty  Other:  RRR no evidence of shortness G.  Lung CTA B.  There is a surgical systolic murmur.  Medical Decision Making  Medically screening exam initiated at 10:19 AM.  Appropriate orders placed.  [name] was informed that the remainder of the evaluation will be completed by another provider, this initial triage assessment does not replace that evaluation, and the importance of remaining in the ED until their evaluation is complete.  This chart was dictated using voice recognition software, Dragon. Despite the best efforts of this provider to proofread and correct errors, errors may still occur which can change documentation meaning.    Aura Dials 04/09/21 1020    Elnora Morrison, MD 04/15/21 1100

## 2021-04-10 ENCOUNTER — Other Ambulatory Visit (HOSPITAL_COMMUNITY): Payer: Medicare PPO

## 2021-04-10 ENCOUNTER — Observation Stay (HOSPITAL_BASED_OUTPATIENT_CLINIC_OR_DEPARTMENT_OTHER): Payer: Medicare PPO

## 2021-04-10 DIAGNOSIS — Z87891 Personal history of nicotine dependence: Secondary | ICD-10-CM | POA: Diagnosis not present

## 2021-04-10 DIAGNOSIS — N189 Chronic kidney disease, unspecified: Secondary | ICD-10-CM | POA: Diagnosis not present

## 2021-04-10 DIAGNOSIS — R079 Chest pain, unspecified: Secondary | ICD-10-CM

## 2021-04-10 DIAGNOSIS — Z8546 Personal history of malignant neoplasm of prostate: Secondary | ICD-10-CM | POA: Diagnosis not present

## 2021-04-10 DIAGNOSIS — I2510 Atherosclerotic heart disease of native coronary artery without angina pectoris: Secondary | ICD-10-CM | POA: Diagnosis not present

## 2021-04-10 DIAGNOSIS — Z79899 Other long term (current) drug therapy: Secondary | ICD-10-CM | POA: Diagnosis not present

## 2021-04-10 DIAGNOSIS — I129 Hypertensive chronic kidney disease with stage 1 through stage 4 chronic kidney disease, or unspecified chronic kidney disease: Secondary | ICD-10-CM | POA: Diagnosis not present

## 2021-04-10 DIAGNOSIS — Z20822 Contact with and (suspected) exposure to covid-19: Secondary | ICD-10-CM | POA: Diagnosis not present

## 2021-04-10 DIAGNOSIS — I214 Non-ST elevation (NSTEMI) myocardial infarction: Secondary | ICD-10-CM | POA: Diagnosis not present

## 2021-04-10 DIAGNOSIS — Z951 Presence of aortocoronary bypass graft: Secondary | ICD-10-CM | POA: Diagnosis not present

## 2021-04-10 DIAGNOSIS — Z7982 Long term (current) use of aspirin: Secondary | ICD-10-CM | POA: Diagnosis not present

## 2021-04-10 LAB — CBC
HCT: 32.2 % — ABNORMAL LOW (ref 39.0–52.0)
Hemoglobin: 10.6 g/dL — ABNORMAL LOW (ref 13.0–17.0)
MCH: 31.8 pg (ref 26.0–34.0)
MCHC: 32.9 g/dL (ref 30.0–36.0)
MCV: 96.7 fL (ref 80.0–100.0)
Platelets: 212 10*3/uL (ref 150–400)
RBC: 3.33 MIL/uL — ABNORMAL LOW (ref 4.22–5.81)
RDW: 12.3 % (ref 11.5–15.5)
WBC: 6.1 10*3/uL (ref 4.0–10.5)
nRBC: 0 % (ref 0.0–0.2)

## 2021-04-10 LAB — ECHOCARDIOGRAM COMPLETE
AR max vel: 2.83 cm2
AV Area VTI: 2.3 cm2
AV Area mean vel: 2.61 cm2
AV Mean grad: 3 mmHg
AV Peak grad: 5 mmHg
Ao pk vel: 1.12 m/s
Area-P 1/2: 2.93 cm2
Height: 70 in
Weight: 2917.13 oz

## 2021-04-10 LAB — BASIC METABOLIC PANEL
Anion gap: 7 (ref 5–15)
BUN: 11 mg/dL (ref 8–23)
CO2: 28 mmol/L (ref 22–32)
Calcium: 8.8 mg/dL — ABNORMAL LOW (ref 8.9–10.3)
Chloride: 100 mmol/L (ref 98–111)
Creatinine, Ser: 0.7 mg/dL (ref 0.61–1.24)
GFR, Estimated: >60 mL/min (ref >60)
Glucose, Bld: 123 mg/dL — ABNORMAL HIGH (ref 70–99)
Potassium: 3.8 mmol/L (ref 3.5–5.1)
Sodium: 135 mmol/L (ref 135–145)

## 2021-04-10 LAB — GLUCOSE, CAPILLARY
Glucose-Capillary: 126 mg/dL — ABNORMAL HIGH (ref 70–99)
Glucose-Capillary: 164 mg/dL — ABNORMAL HIGH (ref 70–99)
Glucose-Capillary: 194 mg/dL — ABNORMAL HIGH (ref 70–99)
Glucose-Capillary: 150 mg/dL — ABNORMAL HIGH (ref 70–99)

## 2021-04-10 LAB — LIPID PANEL
Cholesterol: 95 mg/dL (ref 0–200)
HDL: 27 mg/dL — ABNORMAL LOW (ref >40)
LDL Cholesterol: 48 mg/dL (ref 0–99)
Total CHOL/HDL Ratio: 3.5 RATIO
Triglycerides: 102 mg/dL (ref <150)
VLDL: 20 mg/dL (ref 0–40)

## 2021-04-10 MED ORDER — PERFLUTREN LIPID MICROSPHERE
1.0000 mL | INTRAVENOUS | Status: AC | PRN
  Administered 2021-04-10: 2 mL via INTRAVENOUS
  Filled 2021-04-10: qty 10

## 2021-04-10 NOTE — Plan of Care (Signed)
Vitals stable with BP occasionally elevated. TR band assess, deflated and removed per protocol. No complaints of chest pain. 1 Assist with walker. Per notes from team, plan to medically manage. Will continue to monitor.   Problem: Education: Goal: Knowledge of General Education information will improve Description: Including pain rating scale, medication(s)/side effects and non-pharmacologic comfort measures Outcome: Progressing   Problem: Health Behavior/Discharge Planning: Goal: Ability to manage health-related needs will improve Outcome: Progressing   Problem: Clinical Measurements: Goal: Ability to maintain clinical measurements within normal limits will improve Outcome: Progressing Goal: Will remain free from infection Outcome: Progressing Goal: Diagnostic test results will improve Outcome: Progressing Goal: Respiratory complications will improve Outcome: Progressing Goal: Cardiovascular complication will be avoided Outcome: Progressing   Problem: Activity: Goal: Risk for activity intolerance will decrease Outcome: Progressing   Problem: Nutrition: Goal: Adequate nutrition will be maintained Outcome: Progressing   Problem: Coping: Goal: Level of anxiety will decrease Outcome: Progressing   Problem: Elimination: Goal: Will not experience complications related to bowel motility Outcome: Progressing Goal: Will not experience complications related to urinary retention Outcome: Progressing   Problem: Pain Managment: Goal: General experience of comfort will improve Outcome: Progressing   Problem: Safety: Goal: Ability to remain free from injury will improve Outcome: Progressing   Problem: Skin Integrity: Goal: Risk for impaired skin integrity will decrease Outcome: Progressing   Problem: Fluid Volume: Goal: Will show no signs and symptoms of excessive bleeding Outcome: Progressing   Problem: Pain Management: Goal: Satisfaction with pain management regimen  will be met by discharge Outcome: Progressing   

## 2021-04-10 NOTE — Progress Notes (Signed)
Subjective:  Denies SSCP, palpitations or Dyspnea Still drives Cannot take nitrates with headache vomiting and hand swelling  Objective:  Vitals:   04/10/21 0000 04/10/21 0051 04/10/21 0524 04/10/21 0755  BP: (!) 124/50 (!) 144/58 132/62 135/63  Pulse:  63 71 70  Resp: 14 12 12 14   Temp:  98.3 F (36.8 C)  98.1 F (36.7 C)  TempSrc:  Oral  Oral  SpO2:  100% 98% 95%  Weight:      Height:        Intake/Output from previous day:  Intake/Output Summary (Last 24 hours) at 04/10/2021 0809 Last data filed at 04/10/2021 0300 Gross per 24 hour  Intake 762.39 ml  Output 450 ml  Net 312.39 ml    Physical Exam: Affect appropriate Healthy:  appears stated age HEENT: normal Neck supple with no adenopathy JVP normal no bruits no thyromegaly Lungs clear with no wheezing and good diaphragmatic motion Heart:  S1/S2 no murmur, no rub, gallop or click PMI normal Abdomen: benighn, BS positve, no tenderness, no AAA no bruit.  No HSM or HJR Distal pulses intact with no bruits No edema Neuro non-focal Skin warm and dry No muscular weakness   Lab Results: Basic Metabolic Panel: Recent Labs    04/09/21 1004 04/10/21 0231  NA 137 135  K 4.4 3.8  CL 98 100  CO2 29 28  GLUCOSE 234* 123*  BUN 13 11  CREATININE 0.78 0.70  CALCIUM 9.3 8.8*   Liver Function Tests: No results for input(s): AST, ALT, ALKPHOS, BILITOT, PROT, ALBUMIN in the last 72 hours. No results for input(s): LIPASE, AMYLASE in the last 72 hours. CBC: Recent Labs    04/09/21 1004 04/10/21 0231  WBC 6.2 6.1  HGB 11.5* 10.6*  HCT 35.5* 32.2*  MCV 98.3 96.7  PLT 211 212   Cardiac Enzymes: No results for input(s): CKTOTAL, CKMB, CKMBINDEX, TROPONINI in the last 72 hours. BNP: Invalid input(s): POCBNP D-Dimer: No results for input(s): DDIMER in the last 72 hours. Hemoglobin A1C: No results for input(s): HGBA1C in the last 72 hours. Fasting Lipid Panel: Recent Labs    04/10/21 0231  CHOL 95   HDL 27*  LDLCALC 48  TRIG 102  CHOLHDL 3.5   Thyroid Function Tests: No results for input(s): TSH, T4TOTAL, T3FREE, THYROIDAB in the last 72 hours.  Invalid input(s): FREET3 Anemia Panel: No results for input(s): VITAMINB12, FOLATE, FERRITIN, TIBC, IRON, RETICCTPCT in the last 72 hours.  Imaging: DG Chest 2 View  Result Date: 04/09/2021 CLINICAL DATA:  Sudden onset of chest pain EXAM: CHEST - 2 VIEW COMPARISON:  01/08/2020 FINDINGS: Chronic elevation of the left diaphragm. Chronic generalized interstitial coarsening. Normal heart size and stable mediastinal contours. CABG. There is no edema, consolidation, effusion, or pneumothorax. IMPRESSION: Stable from prior.  No evidence of acute disease. Electronically Signed   By: Jorje Guild M.D.   On: 04/09/2021 10:47   CARDIAC CATHETERIZATION  Result Date: 04/09/2021   Mid LM lesion is 35% stenosed.   Ost LAD to Mid LAD lesion is 100% stenosed.   Dist LAD-1 lesion is 60% stenosed.   Dist LAD-2 lesion is 90% stenosed.   Prox RCA to Dist RCA lesion is 100% stenosed.   1st Mrg lesion is 100% stenosed.   2nd Mrg lesion is 75% stenosed.   Prox Cx lesion is 99% stenosed.   Prox Cx to Mid Cx lesion is 95% stenosed.   LIMA and is normal in caliber.  and is normal in caliber.   and is large.   The graft exhibits no disease.   The graft exhibits minimal luminal irregularities.   The graft exhibits mild .   The left ventricular systolic function is normal.   LV end diastolic pressure is normal.   The left ventricular ejection fraction is 55-65% by visual estimate. 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 Flow appears worse compared to prior study. We have attempted PCI of this in the past but unable to cross with a wire. There are no  targets for PCI. Will continue medical therapy. Intolerant of higher Ranexa dose. Will increase amlodipine.    Cardiac Studies:  ECG:  Orders placed or performed during the hospital encounter of 04/09/21   ED EKG   ED EKG   EKG 12-Lead   EKG 12-Lead   EKG 12-lead   EKG 12-Lead   EKG 12-Lead     Telemetry:  NSR 04/10/2021   Echo: pending   Medications:    amLODipine  7.5 mg Oral QHS   aspirin  81 mg Oral Pre-Cath   aspirin EC  81 mg Oral Daily   atorvastatin  80 mg Oral q1800   furosemide  40 mg Oral Daily   heparin  5,000 Units Subcutaneous Q8H   levothyroxine  100 mcg Oral QAC breakfast   losartan  100 mg Oral Daily   pantoprazole  40 mg Oral BID   ranolazine  500 mg Oral BID   sodium chloride flush  3 mL Intravenous Q12H   sodium chloride flush  3 mL Intravenous Q12H      sodium chloride     sodium chloride 1 mL/kg/hr (04/09/21 1526)    Assessment/Plan:   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 Troponin peak 878 Chronic calcified diffuse native circumflex dx unable to be crossed in past Continue medical Rx norvasc increased 04/09/21 on ranolazine has swelling in hands with nitrates  Thyroid:  on synthroid TSH normal HLD:  continue statin  GERD:  continue Protonix low carb diet   Plan d/c in am   Jenkins Rouge 04/10/2021, 8:09 AM

## 2021-04-10 NOTE — Progress Notes (Signed)
  Echocardiogram 2D Echocardiogram with contrast has been performed.  Merrie Roof F 04/10/2021, 1:22 PM

## 2021-04-11 DIAGNOSIS — Z8546 Personal history of malignant neoplasm of prostate: Secondary | ICD-10-CM | POA: Diagnosis not present

## 2021-04-11 DIAGNOSIS — Z79899 Other long term (current) drug therapy: Secondary | ICD-10-CM | POA: Diagnosis not present

## 2021-04-11 DIAGNOSIS — I129 Hypertensive chronic kidney disease with stage 1 through stage 4 chronic kidney disease, or unspecified chronic kidney disease: Secondary | ICD-10-CM | POA: Diagnosis not present

## 2021-04-11 DIAGNOSIS — Z87891 Personal history of nicotine dependence: Secondary | ICD-10-CM | POA: Diagnosis not present

## 2021-04-11 DIAGNOSIS — N189 Chronic kidney disease, unspecified: Secondary | ICD-10-CM | POA: Diagnosis not present

## 2021-04-11 DIAGNOSIS — I2510 Atherosclerotic heart disease of native coronary artery without angina pectoris: Secondary | ICD-10-CM | POA: Diagnosis not present

## 2021-04-11 DIAGNOSIS — Z951 Presence of aortocoronary bypass graft: Secondary | ICD-10-CM | POA: Diagnosis not present

## 2021-04-11 DIAGNOSIS — Z7982 Long term (current) use of aspirin: Secondary | ICD-10-CM | POA: Diagnosis not present

## 2021-04-11 DIAGNOSIS — Z20822 Contact with and (suspected) exposure to covid-19: Secondary | ICD-10-CM | POA: Diagnosis not present

## 2021-04-11 DIAGNOSIS — I214 Non-ST elevation (NSTEMI) myocardial infarction: Secondary | ICD-10-CM | POA: Diagnosis not present

## 2021-04-11 LAB — CBC
HCT: 32.5 % — ABNORMAL LOW (ref 39.0–52.0)
Hemoglobin: 10.9 g/dL — ABNORMAL LOW (ref 13.0–17.0)
MCH: 32.3 pg (ref 26.0–34.0)
MCHC: 33.5 g/dL (ref 30.0–36.0)
MCV: 96.4 fL (ref 80.0–100.0)
Platelets: 203 10*3/uL (ref 150–400)
RBC: 3.37 MIL/uL — ABNORMAL LOW (ref 4.22–5.81)
RDW: 12.3 % (ref 11.5–15.5)
WBC: 6.8 10*3/uL (ref 4.0–10.5)
nRBC: 0 % (ref 0.0–0.2)

## 2021-04-11 LAB — GLUCOSE, CAPILLARY
Glucose-Capillary: 128 mg/dL — ABNORMAL HIGH (ref 70–99)
Glucose-Capillary: 232 mg/dL — ABNORMAL HIGH (ref 70–99)

## 2021-04-11 MED ORDER — AMLODIPINE BESYLATE 5 MG PO TABS
7.5000 mg | ORAL_TABLET | Freq: Every day | ORAL | 3 refills | Status: DC
Start: 2021-04-11 — End: 2022-09-29

## 2021-04-11 MED ORDER — RANOLAZINE ER 500 MG PO TB12: 500.0000 mg | ORAL_TABLET | Freq: Two times a day (BID) | ORAL | 3 refills | Status: DC

## 2021-04-11 MED ORDER — METFORMIN HCL 500 MG PO TABS: 500.0000 mg | ORAL_TABLET | Freq: Two times a day (BID) | ORAL | Status: DC

## 2021-04-11 NOTE — Discharge Summary (Addendum)
Discharge Summary    Patient ID: Alexander Choi MRN: [id number]; DOB: [birth year]  Admit date: 04/09/2021 Discharge date: 04/11/2021  PCP:  Nicoletta Dress, MD   [facility] Providers Cardiologist:  Carole Deere [name], MD  Discharge Diagnoses    Principal Problem:   Coronary artery disease Active Problems:   Hypertension   Hypercholesterolemia   Diabetes mellitus ([place])   Peripheral arterial disease ([place])   NSTEMI (non-ST elevated myocardial infarction) [facility])   Diagnostic Studies/Procedures    Left heart cath 04/09/21: Mid LM lesion is 35% stenosed.   Ost LAD to Mid LAD lesion is 100% stenosed.   Dist LAD-1 lesion is 60% stenosed.   Dist LAD-2 lesion is 90% stenosed.   Prox RCA to Dist RCA lesion is 100% stenosed.   1st Mrg lesion is 100% stenosed.   2nd Mrg lesion is 75% stenosed.   Prox Cx lesion is 99% stenosed.   Prox Cx to Mid Cx lesion is 95% stenosed.   LIMA and is normal in caliber.   and is normal in caliber.   and is large.   The graft exhibits no disease.   The graft exhibits minimal luminal irregularities.   The graft exhibits mild .   The left ventricular systolic function is normal.   LV end diastolic pressure is normal.   The left ventricular ejection fraction is 55-65% by visual estimate.   Severe 3 vessel obstructive CAD Continued patency of bypass grafts including LIMA to LAD, SVG to OM1, SVG to RV marginal vessel Normal LV function Normal LVEDP   Plan: I suspect his culprit is the native LCx. This vessel is severely diseased, calcified and aneurysmal. Flow appears worse compared to prior study. We have attempted PCI of this in the past but unable to cross with a wire. There are no targets for PCI. Will continue medical therapy. Intolerant of higher Ranexa dose. Will increase amlodipine.    Diagnostic Dominance: Right  _____________   Echo 04/10/21: 1. Left ventricular ejection fraction, by estimation, is 60 to 65%. The  left ventricle  has normal function. The left ventricle has no regional  wall motion abnormalities. Left ventricular diastolic parameters are  consistent with Grade I diastolic  dysfunction (impaired relaxation).   2. Right ventricular systolic function is normal. The right ventricular  size is normal.   3. The mitral valve is abnormal. Trivial mitral valve regurgitation. No  evidence of mitral stenosis.   4. The aortic valve was not well visualized. Aortic valve regurgitation  is not visualized. Mild to moderate aortic valve sclerosis/calcification  is present, without any evidence of aortic stenosis.   5. The inferior vena cava is normal in size with greater than 50%  respiratory variability, suggesting right atrial pressure of 3 mmHg.   History of Present Illness     Alexander Choi is a 87 y.o. male with  CAD s/p CABG 2006, HTN, HLD, DM II and PAD who is being seen 04/09/2021 for the evaluation of chest pain.  Alexander Choi is a 87 year old male with past medical history of CAD s/p CABG 2006, HTN, HLD, DM II and PAD.  Myoview in October 2015 showed inferior ischemia.  A subsequent cardiac catheterization showed patent grafts with severe disease in the native left circumflex that predated his CABG.  EGD in December 2020 showed mild inflammation.  Last cardiac catheterization performed on 12/12/2019 for chest pain revealed a complex three-vessel obstructive CAD with patent LIMA to LAD with severe 90% distal LAD  lesion that was small in caliber and not suitable for PCI, patent SVG to OM1, patent SVG to RV marginal branch, occluded proximal to distal RCA was left to right collaterals from left circumflex to distal RCA, 99% proximal left circumflex lesion with 90% proximal to mid left circumflex lesion.  The left circumflex was heavily calcified.  Patient returned to the Cath Lab on the following day for staged intervention of PCI.  Unfortunately, this was unsuccessful due to inability to cross the lesion with a wire.   He was seen by Dr. Alvester Chou in early 2022 for abnormal lower extremity Doppler suggestive of occluded SFAs, however given lack of claudication symptoms, invasive intervention was deferred, 55-month follow-up was recommended.   More recently, patient was seen by Fabian Sharp PA-C on 04/25/2021 for 69-month history of shortness of breath with exertion and 3 times per week nightly episode of chest pain.  He mentions he mainly feels chest pain when he is trying to put on his clothing.  Given the previously known operable left circumflex lesion, medical therapy was recommended.  His Ranexa was increased to 1000 mg twice a day.  Unfortunately he had significant dizziness on the higher dose of Ranexa and went back to the lower dose after 2 days.  Echocardiogram has been recommended.    Patient had worst episode of left-sided dull chest pain radiating down the left arm around 8:15 AM this morning.  Symptoms lasted for about an hour and eventually went away by 9:15 AM.  This prompted the patient to seek medical attention at [facility], ED.  Hemoglobin 11.5.  Normal renal function and electrolyte.  EKG showed normal sinus rhythm, no significant ST-T wave changes.  Unchanged when compared to the previous EKG.  Chest x-ray normal.  High-sensitivity troponin borderline elevated to 79, second troponin is currently pending.  Talking with the patient, he is currently chest pain-free.  He did mentions his blood pressure is usually high whenever he has chest pain.  Systolic blood pressure could be as high as 180s in the setting of chest pain.  Interestingly, he was able to use a broom to clean out his driveway for 6-1/9 hours yesterday without any exertional chest discomfort.  Hospital Course     Consultants: none  NSTEMI CAD s/p CABG 2006 (LIMA-LAD, SVG-RV marginal branch, SVG-OM1), prior unsuccessful PCI to LCX HS troponin trended from 79 --> 878. EKG appeared unchanged from prior tracings. Repeat heart catheterization was  performed and showed continued patency of 3/3 grafts and severely disease, calcified and aneurysmal LCX. Flow appeared worse compared to prior study in the LCX, however, no targets for PCI. Continued medical therapy was recommended. He did not tolerate higher doses of ranexa. Amlodipine increased from 5 mg to 7.5 mg. Intolerant of nitrates due to headache, vomiting, and hand swelling. Resume 500 mg ranexa BID.     Hyperlipidemia with LDL goal < 70 Continue 80 gm lipitor. LDL in 12/2020 was 61   Hypertension Amlodipine increased for anti-angina effects, continue losartan.    Paroxysmal atrial tachycardia Noted on telemetry in hospital and heart monitor 2021 Not on BB, no longer on amiodarone. No palpitations.    DM2 Continue home regimen, resume metformin in 48 hr.  Consider SGLT2i, defer to primary.    Pt was seen and examined by Dr. Johnsie Cancel and deemed stable for discharge. Follow up will be arranged.    Did the patient have an acute coronary syndrome (MI, NSTEMI, STEMI, etc) this admission?:  Yes  AHA/ACC Clinical Performance & Quality Measures: Aspirin prescribed? - Yes ADP Receptor Inhibitor (Plavix/Clopidogrel, Brilinta/Ticagrelor or Effient/Prasugrel) prescribed (includes medically managed patients)? - No - no intervention Beta Blocker prescribed? - No - bradycardia High Intensity Statin (Lipitor 40-80mg  or Crestor 20-40mg ) prescribed? - Yes EF assessed during THIS hospitalization? - Yes For EF <40%, was ACEI/ARB prescribed? - Not Applicable (EF >/= 67%) For EF <40%, Aldosterone Antagonist (Spironolactone or Eplerenone) prescribed? - Not Applicable (EF >/= 12%) Cardiac Rehab Phase II ordered (including medically managed patients)? - Yes       The patient will be scheduled for a TOC follow up appointment in 7-14 days.  A message has been sent to the [facility] and Scheduling Pool at the office where the patient should be seen for follow up.   _____________  Discharge Vitals Blood pressure (!) 119/55, pulse 75, temperature 97.8 F (36.6 C), temperature source Oral, resp. rate 15, height 5\' 10"  (1.778 m), weight 82.7 kg, SpO2 97 %.  Filed Weights   04/09/21 1200  Weight: 82.7 kg    Labs & Radiologic Studies    CBC Recent Labs    04/10/21 0231 04/11/21 0233  WBC 6.1 6.8  HGB 10.6* 10.9*  HCT 32.2* 32.5*  MCV 96.7 96.4  PLT 212 458   Basic Metabolic Panel Recent Labs    04/09/21 1004 04/10/21 0231  NA 137 135  K 4.4 3.8  CL 98 100  CO2 29 28  GLUCOSE 234* 123*  BUN 13 11  CREATININE 0.78 0.70  CALCIUM 9.3 8.8*   Liver Function Tests No results for input(s): AST, ALT, ALKPHOS, BILITOT, PROT, ALBUMIN in the last 72 hours. No results for input(s): LIPASE, AMYLASE in the last 72 hours. High Sensitivity Troponin:   Recent Labs  Lab 04/09/21 1004 04/09/21 1204  TROPONINIHS 79* 878*    BNP Invalid input(s): POCBNP D-Dimer No results for input(s): DDIMER in the last 72 hours. Hemoglobin A1C No results for input(s): HGBA1C in the last 72 hours. Fasting Lipid Panel Recent Labs    04/10/21 0231  CHOL 95  HDL 27*  LDLCALC 48  TRIG 102  CHOLHDL 3.5   Thyroid Function Tests No results for input(s): TSH, T4TOTAL, T3FREE, THYROIDAB in the last 72 hours.  Invalid input(s): FREET3 _____________  DG Chest 2 View  Result Date: 04/09/2021 CLINICAL DATA:  Sudden onset of chest pain EXAM: CHEST - 2 VIEW COMPARISON:  01/08/2020 FINDINGS: Chronic elevation of the left diaphragm. Chronic generalized interstitial coarsening. Normal heart size and stable mediastinal contours. CABG. There is no edema, consolidation, effusion, or pneumothorax. IMPRESSION: Stable from prior.  No evidence of acute disease. Electronically Signed   By: Jorje Guild M.D.   On: 04/09/2021 10:47   CARDIAC CATHETERIZATION  Result Date: 04/09/2021   Mid LM lesion is 35% stenosed.   Ost LAD to Mid LAD lesion is 100% stenosed.   Dist  LAD-1 lesion is 60% stenosed.   Dist LAD-2 lesion is 90% stenosed.   Prox RCA to Dist RCA lesion is 100% stenosed.   1st Mrg lesion is 100% stenosed.   2nd Mrg lesion is 75% stenosed.   Prox Cx lesion is 99% stenosed.   Prox Cx to Mid Cx lesion is 95% stenosed.   LIMA and is normal in caliber.   and is normal in caliber.   and is large.   The graft exhibits no disease.   The graft exhibits minimal luminal irregularities.   The graft exhibits mild .  The left ventricular systolic function is normal.   LV end diastolic pressure is normal.   The left ventricular ejection fraction is 55-65% by visual estimate. 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 Flow appears worse compared to prior study. We have attempted PCI of this in the past but unable to cross with a wire. There are no targets for PCI. Will continue medical therapy. Intolerant of higher Ranexa dose. Will increase amlodipine.   ECHOCARDIOGRAM COMPLETE  Result Date: 04/10/2021    ECHOCARDIOGRAM REPORT   Patient Name:   Alexander Choi Date of Exam: 04/10/2021 Medical Rec #:  [id number]       Height:       70.0 in Accession #:    [id number]      Weight:       182.3 lb Date of Birth:  October 22, 1932        BSA:          2.007 m Patient Age:    54 years        BP:           112/54 mmHg Patient Gender: M               HR:           76 bpm. Exam Location:  Inpatient Procedure: 2D Echo, Cardiac Doppler, Color Doppler and Intracardiac            Opacification Agent Indications:    Chest Pain  History:        Patient has prior history of Echocardiogram examinations. CAD;                 Prior CABG and Cath yesterday.  Sonographer:    Merrie Roof RDCS Referring Phys: [id number] [place]  1. Left ventricular ejection fraction, by  estimation, is 60 to 65%. The left ventricle has normal function. The left ventricle has no regional wall motion abnormalities. Left ventricular diastolic parameters are consistent with Grade I diastolic dysfunction (impaired relaxation).  2. Right ventricular systolic function is normal. The right ventricular size is normal.  3. The mitral valve is abnormal. Trivial mitral valve regurgitation. No evidence of mitral stenosis.  4. The aortic valve was not well visualized. Aortic valve regurgitation is not visualized. Mild to moderate aortic valve sclerosis/calcification is present, without any evidence of aortic stenosis.  5. The inferior vena cava is normal in size with greater than 50% respiratory variability, suggesting right atrial pressure of 3 mmHg. FINDINGS  Left Ventricle: Left ventricular ejection fraction, by estimation, is 60 to 65%. The left ventricle has normal function. The left ventricle has no regional wall motion abnormalities. The left ventricular internal cavity size was normal in size. There is  no left ventricular hypertrophy. Left ventricular diastolic parameters are consistent with Grade I diastolic dysfunction (impaired relaxation). Right Ventricle: The right ventricular size is normal. No increase in right ventricular wall thickness. Right ventricular systolic function is normal. Left Atrium: Left atrial size was normal in size. Right Atrium: Right atrial size was normal in size. Pericardium: There is no evidence of pericardial effusion. Mitral Valve: The mitral valve is abnormal. There is mild thickening of the mitral valve leaflet(s). There is mild calcification of the mitral valve leaflet(s). Mild mitral annular  calcification. Trivial mitral valve regurgitation. No evidence of mitral valve stenosis. Tricuspid Valve: The tricuspid valve is normal in structure. Tricuspid valve regurgitation is not demonstrated. No evidence of tricuspid stenosis. Aortic Valve: The aortic valve was not well  visualized. Aortic valve regurgitation is not visualized. Mild to moderate aortic valve sclerosis/calcification is present, without any evidence of aortic stenosis. Aortic valve mean gradient measures 3.0 mmHg.  Aortic valve peak gradient measures 5.0 mmHg. Aortic valve area, by VTI measures 2.30 cm. Pulmonic Valve: The pulmonic valve was normal in structure. Pulmonic valve regurgitation is not visualized. No evidence of pulmonic stenosis. Aorta: The aortic root is normal in size and structure. Venous: The inferior vena cava is normal in size with greater than 50% respiratory variability, suggesting right atrial pressure of 3 mmHg. IAS/Shunts: No atrial level shunt detected by color flow Doppler.  LEFT VENTRICLE PLAX 2D LVOT diam:     2.00 cm   Diastology LV SV:         49        LV e' medial:    5.11 cm/s LV SV Index:   24        LV E/e' medial:  7.2 LVOT Area:     3.14 cm  LV e' lateral:   9.79 cm/s                          LV E/e' lateral: 3.8  RIGHT VENTRICLE RV Basal diam:  3.30 cm LEFT ATRIUM              Index        RIGHT ATRIUM           Index LA Vol (A2C):   107.0 ml 53.32 ml/m  RA Area:     15.90 cm LA Vol (A4C):   59.0 ml  29.40 ml/m  RA Volume:   35.00 ml  17.44 ml/m LA Biplane Vol: 84.9 ml  42.31 ml/m  AORTIC VALVE AV Area (Vmax):    2.83 cm AV Area (Vmean):   2.61 cm AV Area (VTI):     2.30 cm AV Vmax:           112.00 cm/s AV Vmean:          76.800 cm/s AV VTI:            0.213 m AV Peak Grad:      5.0 mmHg AV Mean Grad:      3.0 mmHg LVOT Vmax:         101.00 cm/s LVOT Vmean:        63.800 cm/s LVOT VTI:          0.156 m LVOT/AV VTI ratio: 0.73 MITRAL VALVE MV Area (PHT): 2.93 cm     SHUNTS MV Decel Time: 259 msec     Systemic VTI:  0.16 m MV E velocity: 36.80 cm/s   Systemic Diam: 2.00 cm MV A velocity: 106.00 cm/s MV E/A ratio:  0.35 Jenkins Rouge MD Electronically signed by Jenkins Rouge MD Signature Date/Time: 04/10/2021/2:34:43 PM    Final    Disposition   Pt is being discharged  home today in good condition.  Follow-up Plans & Appointments     Follow-up Information     [name], Lisabeth Mian M, MD Follow up in 1 week(s).   Specialty: Cardiology Why: [facility], hospital follow up Contact information: [address] [address] [phone]  Discharge Instructions     Diet - low sodium heart healthy   Complete by: As directed    Discharge instructions   Complete by: As directed    No driving for 2 days. No lifting over 5 lbs for 1 week. No sexual activity for 1 week. Keep procedure site clean & dry. If you notice increased pain, swelling, bleeding or pus, call/return!  You may shower, but no soaking baths/hot tubs/pools for 1 week.   Increase activity slowly   Complete by: As directed        Discharge Medications   Allergies as of 04/11/2021       Reactions   Ace Inhibitors Other (See Comments)   unknown   Nitrates, Organic Swelling   Swelling in hands   Shellfish Allergy Nausea Only   Pt states scallops-not all shellfish-GI symptoms/ nausea -50+ years ago   Penicillins Rash        Medication List     TAKE these medications    Accu-Chek Aviva Plus test strip Generic drug: glucose blood   Accu-Chek Softclix Lancets lancets   Accu-Chek Softclix Lancets lancets   albuterol 108 (90 Base) MCG/ACT inhaler Commonly known as: VENTOLIN HFA Inhale 2 puffs into the lungs every 6 (six) hours as needed for wheezing or shortness of breath.   amLODipine 5 MG tablet Commonly known as: NORVASC Take 1.5 tablets (7.5 mg total) by mouth at bedtime. What changed: how much to take   aspirin 81 MG tablet Take 81 mg by mouth daily.   atorvastatin 80 MG tablet Commonly known as: LIPITOR Take 1 tablet (80 mg total) by mouth daily.   BENEFIBER PO Take 1 tablet by mouth daily.   famotidine 40 MG tablet Commonly known as: PEPCID Take 40 mg by mouth daily.   finasteride 5 MG tablet Commonly known as: PROSCAR Take  5 mg by mouth daily.   furosemide 40 MG tablet Commonly known as: LASIX Take 1 tablet (40 mg total) by mouth daily.   gabapentin 100 MG capsule Commonly known as: NEURONTIN Take 100 mg by mouth 2 (two) times daily.   glimepiride 4 MG tablet Commonly known as: AMARYL Take 4 mg by mouth daily with breakfast.   ICAPS AREDS 2 PO Take 1 Cartridge by mouth in the morning and at bedtime.   levothyroxine 100 MCG tablet Commonly known as: SYNTHROID Take 1 tablet (100 mcg total) by mouth daily before breakfast.   losartan 100 MG tablet Commonly known as: COZAAR Take 100 mg by mouth daily.   meclizine 25 MG tablet Commonly known as: ANTIVERT Take 1 tablet (25 mg total) by mouth 3 (three) times daily as needed for dizziness.   metFORMIN 500 MG tablet Commonly known as: GLUCOPHAGE Take 1 tablet (500 mg total) by mouth 2 (two) times daily with a meal. Resume on 04/13/21. What changed: additional instructions   nitroGLYCERIN 0.4 MG SL tablet Commonly known as: NITROSTAT Place 1 tablet (0.4 mg total) under the tongue every 5 (five) minutes as needed for chest pain.   NONFORMULARY OR COMPOUNDED ITEM [facility]:  Peripheral Neuropathy Cream - Bupivacaine 1%, doxepin 3%, Gabapentin 6%, Pentoxifylline 3%, Topiramate 1%, apply 1-2 grams to affected area 3-4 times daily.   ondansetron 4 MG tablet Commonly known as: ZOFRAN Take 1 tablet (4 mg total) by mouth every 8 (eight) hours as needed for nausea or vomiting.   pantoprazole 40 MG tablet Commonly known as: PROTONIX Take 1 tablet (40 mg total) by mouth  daily. What changed: when to take this   ranolazine 500 MG 12 hr tablet Commonly known as: RANEXA Take 1 tablet (500 mg total) by mouth 2 (two) times daily. What changed:  medication strength how much to take   vitamin B-12 1000 MCG tablet Commonly known as: CYANOCOBALAMIN Take 1 tablet (1,000 mcg total) by mouth daily.           Outstanding Labs/Studies    none  Duration of Discharge Encounter   Greater than 30 minutes including physician time.  Signed, Tami Lin Duke, PA 04/11/2021, 8:34 AM  Patient examined chart reviewed No angina medical Rx adequate Did not tolerated higher dose Ranexa , Norvasc dose increased Ready for d/c exam unchanged D/c home outpatient f/u arranged with Dr [name]

## 2021-04-12 ENCOUNTER — Telehealth: Payer: Self-pay | Admitting: Cardiology

## 2021-04-12 ENCOUNTER — Encounter (HOSPITAL_COMMUNITY): Payer: Self-pay | Admitting: Cardiology

## 2021-04-12 NOTE — Telephone Encounter (Signed)
Patient wanted to know when he should follow up with Dr. [name] post heart cath 04/09/21

## 2021-04-12 NOTE — Telephone Encounter (Signed)
Spoke to patient post hospital appointment scheduled with Coletta Memos NP 11/1 at 1:30 pm.

## 2021-04-16 DIAGNOSIS — E039 Hypothyroidism, unspecified: Secondary | ICD-10-CM | POA: Diagnosis not present

## 2021-04-16 DIAGNOSIS — I10 Essential (primary) hypertension: Secondary | ICD-10-CM | POA: Diagnosis not present

## 2021-04-16 DIAGNOSIS — E785 Hyperlipidemia, unspecified: Secondary | ICD-10-CM | POA: Diagnosis not present

## 2021-04-16 DIAGNOSIS — E1165 Type 2 diabetes mellitus with hyperglycemia: Secondary | ICD-10-CM | POA: Diagnosis not present

## 2021-04-16 DIAGNOSIS — D638 Anemia in other chronic diseases classified elsewhere: Secondary | ICD-10-CM | POA: Diagnosis not present

## 2021-04-16 DIAGNOSIS — I25810 Atherosclerosis of coronary artery bypass graft(s) without angina pectoris: Secondary | ICD-10-CM | POA: Diagnosis not present

## 2021-04-16 DIAGNOSIS — E1140 Type 2 diabetes mellitus with diabetic neuropathy, unspecified: Secondary | ICD-10-CM | POA: Diagnosis not present

## 2021-04-21 DIAGNOSIS — N400 Enlarged prostate without lower urinary tract symptoms: Secondary | ICD-10-CM | POA: Diagnosis not present

## 2021-04-21 DIAGNOSIS — E785 Hyperlipidemia, unspecified: Secondary | ICD-10-CM | POA: Diagnosis not present

## 2021-04-21 DIAGNOSIS — Z8249 Family history of ischemic heart disease and other diseases of the circulatory system: Secondary | ICD-10-CM | POA: Diagnosis not present

## 2021-04-21 DIAGNOSIS — Z7982 Long term (current) use of aspirin: Secondary | ICD-10-CM | POA: Diagnosis not present

## 2021-04-21 DIAGNOSIS — E039 Hypothyroidism, unspecified: Secondary | ICD-10-CM | POA: Diagnosis not present

## 2021-04-21 DIAGNOSIS — E1142 Type 2 diabetes mellitus with diabetic polyneuropathy: Secondary | ICD-10-CM | POA: Diagnosis not present

## 2021-04-21 DIAGNOSIS — Z806 Family history of leukemia: Secondary | ICD-10-CM | POA: Diagnosis not present

## 2021-04-21 DIAGNOSIS — R32 Unspecified urinary incontinence: Secondary | ICD-10-CM | POA: Diagnosis not present

## 2021-04-21 DIAGNOSIS — E1151 Type 2 diabetes mellitus with diabetic peripheral angiopathy without gangrene: Secondary | ICD-10-CM | POA: Diagnosis not present

## 2021-04-21 DIAGNOSIS — Z7722 Contact with and (suspected) exposure to environmental tobacco smoke (acute) (chronic): Secondary | ICD-10-CM | POA: Diagnosis not present

## 2021-04-21 DIAGNOSIS — Z833 Family history of diabetes mellitus: Secondary | ICD-10-CM | POA: Diagnosis not present

## 2021-04-21 DIAGNOSIS — Z7984 Long term (current) use of oral hypoglycemic drugs: Secondary | ICD-10-CM | POA: Diagnosis not present

## 2021-04-21 DIAGNOSIS — I25119 Atherosclerotic heart disease of native coronary artery with unspecified angina pectoris: Secondary | ICD-10-CM | POA: Diagnosis not present

## 2021-04-21 DIAGNOSIS — Z800 Family history of malignant neoplasm of digestive organs: Secondary | ICD-10-CM | POA: Diagnosis not present

## 2021-04-21 DIAGNOSIS — I252 Old myocardial infarction: Secondary | ICD-10-CM | POA: Diagnosis not present

## 2021-04-21 DIAGNOSIS — H3530 Unspecified macular degeneration: Secondary | ICD-10-CM | POA: Diagnosis not present

## 2021-04-21 DIAGNOSIS — E1165 Type 2 diabetes mellitus with hyperglycemia: Secondary | ICD-10-CM | POA: Diagnosis not present

## 2021-04-21 DIAGNOSIS — I10 Essential (primary) hypertension: Secondary | ICD-10-CM | POA: Diagnosis not present

## 2021-04-27 ENCOUNTER — Ambulatory Visit: Payer: Medicare PPO | Admitting: General Practice

## 2021-04-28 DIAGNOSIS — I2510 Atherosclerotic heart disease of native coronary artery without angina pectoris: Secondary | ICD-10-CM | POA: Diagnosis not present

## 2021-04-28 DIAGNOSIS — I96 Gangrene, not elsewhere classified: Secondary | ICD-10-CM | POA: Diagnosis not present

## 2021-04-28 DIAGNOSIS — L03115 Cellulitis of right lower limb: Secondary | ICD-10-CM | POA: Diagnosis not present

## 2021-04-28 DIAGNOSIS — Z951 Presence of aortocoronary bypass graft: Secondary | ICD-10-CM | POA: Diagnosis not present

## 2021-04-28 DIAGNOSIS — I771 Stricture of artery: Secondary | ICD-10-CM | POA: Diagnosis not present

## 2021-04-28 DIAGNOSIS — E1151 Type 2 diabetes mellitus with diabetic peripheral angiopathy without gangrene: Secondary | ICD-10-CM | POA: Diagnosis not present

## 2021-04-28 DIAGNOSIS — E1142 Type 2 diabetes mellitus with diabetic polyneuropathy: Secondary | ICD-10-CM | POA: Diagnosis not present

## 2021-04-28 DIAGNOSIS — E1165 Type 2 diabetes mellitus with hyperglycemia: Secondary | ICD-10-CM | POA: Diagnosis not present

## 2021-04-28 DIAGNOSIS — I471 Supraventricular tachycardia: Secondary | ICD-10-CM | POA: Diagnosis not present

## 2021-04-28 DIAGNOSIS — E1140 Type 2 diabetes mellitus with diabetic neuropathy, unspecified: Secondary | ICD-10-CM | POA: Diagnosis not present

## 2021-04-28 DIAGNOSIS — I739 Peripheral vascular disease, unspecified: Secondary | ICD-10-CM | POA: Diagnosis not present

## 2021-04-28 DIAGNOSIS — M87077 Idiopathic aseptic necrosis of right toe(s): Secondary | ICD-10-CM | POA: Diagnosis not present

## 2021-04-28 DIAGNOSIS — L97514 Non-pressure chronic ulcer of other part of right foot with necrosis of bone: Secondary | ICD-10-CM | POA: Diagnosis not present

## 2021-04-28 DIAGNOSIS — Z7984 Long term (current) use of oral hypoglycemic drugs: Secondary | ICD-10-CM | POA: Diagnosis not present

## 2021-04-28 DIAGNOSIS — E1169 Type 2 diabetes mellitus with other specified complication: Secondary | ICD-10-CM | POA: Diagnosis not present

## 2021-04-28 DIAGNOSIS — M869 Osteomyelitis, unspecified: Secondary | ICD-10-CM | POA: Diagnosis not present

## 2021-04-28 DIAGNOSIS — I7389 Other specified peripheral vascular diseases: Secondary | ICD-10-CM | POA: Diagnosis not present

## 2021-04-28 DIAGNOSIS — E11621 Type 2 diabetes mellitus with foot ulcer: Secondary | ICD-10-CM | POA: Diagnosis not present

## 2021-04-28 DIAGNOSIS — M868X7 Other osteomyelitis, ankle and foot: Secondary | ICD-10-CM | POA: Diagnosis not present

## 2021-04-28 DIAGNOSIS — S91104A Unspecified open wound of right lesser toe(s) without damage to nail, initial encounter: Secondary | ICD-10-CM | POA: Diagnosis not present

## 2021-04-28 DIAGNOSIS — Z89421 Acquired absence of other right toe(s): Secondary | ICD-10-CM | POA: Diagnosis not present

## 2021-04-28 DIAGNOSIS — M86171 Other acute osteomyelitis, right ankle and foot: Secondary | ICD-10-CM | POA: Diagnosis not present

## 2021-04-28 DIAGNOSIS — B9561 Methicillin susceptible Staphylococcus aureus infection as the cause of diseases classified elsewhere: Secondary | ICD-10-CM | POA: Diagnosis not present

## 2021-04-28 DIAGNOSIS — E11610 Type 2 diabetes mellitus with diabetic neuropathic arthropathy: Secondary | ICD-10-CM | POA: Diagnosis not present

## 2021-04-28 DIAGNOSIS — I10 Essential (primary) hypertension: Secondary | ICD-10-CM | POA: Diagnosis not present

## 2021-04-28 DIAGNOSIS — E785 Hyperlipidemia, unspecified: Secondary | ICD-10-CM | POA: Diagnosis not present

## 2021-05-04 ENCOUNTER — Telehealth: Payer: Self-pay | Admitting: Cardiology

## 2021-05-04 NOTE — Telephone Encounter (Signed)
Patient states he is returning a call from yesterday. I did not see any notes.

## 2021-05-04 NOTE — Telephone Encounter (Signed)
Called pt and inform nurse unable to see a note regarding a call yesterday. Pt state it could have been a old message.

## 2021-05-12 DIAGNOSIS — N3946 Mixed incontinence: Secondary | ICD-10-CM | POA: Diagnosis not present

## 2021-05-12 DIAGNOSIS — Z8546 Personal history of malignant neoplasm of prostate: Secondary | ICD-10-CM | POA: Diagnosis not present

## 2021-05-13 DIAGNOSIS — Z89421 Acquired absence of other right toe(s): Secondary | ICD-10-CM | POA: Diagnosis not present

## 2021-05-13 DIAGNOSIS — E1142 Type 2 diabetes mellitus with diabetic polyneuropathy: Secondary | ICD-10-CM | POA: Diagnosis not present

## 2021-05-18 DIAGNOSIS — L03119 Cellulitis of unspecified part of limb: Secondary | ICD-10-CM | POA: Diagnosis not present

## 2021-05-18 DIAGNOSIS — Z89421 Acquired absence of other right toe(s): Secondary | ICD-10-CM | POA: Diagnosis not present

## 2021-05-18 DIAGNOSIS — Z792 Long term (current) use of antibiotics: Secondary | ICD-10-CM | POA: Diagnosis not present

## 2021-05-18 DIAGNOSIS — M869 Osteomyelitis, unspecified: Secondary | ICD-10-CM | POA: Diagnosis not present

## 2021-05-19 NOTE — Progress Notes (Signed)
Cardiology Office Note:    Date:  05/26/2021   ID:  Alexander Choi, DOB [birth year], MRN [id number]  PCP:  Nicoletta Dress, MD  Cardiologist:  Deanthony Maull [name], MD   Referring MD: Nicoletta Dress, MD   Chief Complaint  Patient presents with   Coronary Artery Disease     History of Present Illness:    Alexander Choi is a 87 y.o. male with a hx of CAD status post CABG in 09/10/04, P[place]iar in October 2015 that showed inferior ischemia and led to a heart catheterization that showed all grafts patent with severe disease in the native left circumflex that predated his CABG.  He also has hypertension, hyperlipidemia, DM.  His wife passed away in 09-10-17.  He was admitted in October 2020 at [facility] for bowel obstruction treated with surgical lysis of adhesions.  1 week after discharge he fell in his garage and required shoulder surgery in December 2020.  He is aggressively treated for indigestion with Tagamet and Protonix without improvement.  He underwent EGD that showed mild inflammation.  He has symptoms after eating a meal but also occur with activity.  Due to some chest discomfort with radiation to his left arm, he underwent right and left heart catheterization.  Right heart pressures were normal and his angiogram was stable.  There was an attempt to open the chronically occluded left circumflex but this was unsuccessful due to inability to cross the lesion with a wire.  In retrospect the vessel was really unchanged dating back before his CABG.  He was seen in the ER 01/09/2020 for chest pain but ruled out with negative troponins.  Chest pain resolved after GI cocktail.  He was last seen by Dr. [name] on 09/29/2020 after being hospitalized at [facility] for internal bleeding.  He underwent ABIs and arterial duplex found to have PAD and referred to Dr. Gwenlyn Found. He had abnormal dopplers indicating occluded SFAs. In the absence of lifestyle limiting claudication, invasive intervention was  deferred with plans to follow up in 6 months.   He presented in October with worsening chest pain. HS troponin trended from 79 --> 878. EKG appeared unchanged from prior tracings. Repeat heart catheterization was performed and showed continued patency of 3/3 grafts and severely disease, calcified and aneurysmal LCX. Flow appeared worse compared to prior study in the LCX, however, no targets for PCI. Continued medical therapy was recommended. He did not tolerate higher doses of ranexa. Amlodipine increased from 5 mg to 7.5 mg. Intolerant of nitrates due to headache, vomiting, and hand swelling. Resumed 500 mg ranexa BID. Seen today for follow up.   On follow up today he is feeling much better. Decreased dyspnea. No chest pain. Tolerating medication well.    Past Medical History:  Diagnosis Date   Cancer [facility])    Chronic kidney disease    kidney stones   Coronary artery disease    with CABG in 09-10-2004 with LIMA to LAD, SVG to mid and distal OM, SVG to AM. Normal Myoview in May of 2013   Diabetes mellitus    Type 2   GERD (gastroesophageal reflux disease)    Hematuria    Hypercholesterolemia    Hypertension    Irritable bowel syndrome    Loose stools    Prostate cancer ([place])    Urinary obstruction     Past Surgical History:  Procedure Laterality Date   CHOLECYSTECTOMY     CORONARY ARTERY BYPASS GRAFT  2004-09-10  LEFT HEART CATH AND CORS/GRAFTS ANGIOGRAPHY N/A 04/09/2021   Procedure: LEFT HEART CATH AND CORS/GRAFTS ANGIOGRAPHY;  Surgeon: [name], Placido Hangartner M, MD;  Location: [place] CV LAB;  Service: Cardiovascular;  Laterality: N/A;   LEFT HEART CATHETERIZATION WITH CORONARY/GRAFT ANGIOGRAM N/A 04/11/2014   Procedure: LEFT HEART CATHETERIZATION WITH [name];  Surgeon: Zedekiah Hinderman M [name], MD;  Location: [facility] CATH LAB;  Service: Cardiovascular;  Laterality: N/A;   OTHER SURGICAL HISTORY     bowel obstruction   RADIOACTIVE SEED IMPLANT     for prostate cancer   RIGHT/LEFT HEART CATH  AND CORONARY/GRAFT ANGIOGRAPHY N/A 12/12/2019   Procedure: RIGHT/LEFT HEART CATH AND CORONARY/GRAFT ANGIOGRAPHY;  Surgeon: [name], Kathey Simer M, MD;  Location: [place] CV LAB;  Service: Cardiovascular;  Laterality: N/A;   SKIN CANCER EXCISION     TEMPORARY PACEMAKER N/A 12/13/2019   Procedure: TEMPORARY PACEMAKER;  Surgeon: [name], Jameila Keeny M, MD;  Location: [place] CV LAB;  Service: Cardiovascular;  Laterality: N/A;    Current Medications: Current Meds  Medication Sig   Accu-Chek Softclix Lancets lancets    Accu-Chek Softclix Lancets lancets    albuterol (VENTOLIN HFA) 108 (90 Base) MCG/ACT inhaler Inhale 2 puffs into the lungs every 6 (six) hours as needed for wheezing or shortness of breath.   amLODipine (NORVASC) 5 MG tablet Take 1.5 tablets (7.5 mg total) by mouth at bedtime.   aspirin 81 MG tablet Take 81 mg by mouth daily.   atorvastatin (LIPITOR) 80 MG tablet Take 1 tablet (80 mg total) by mouth daily.   cephALEXin (KEFLEX) 500 MG capsule Take by mouth.   famotidine (PEPCID) 40 MG tablet Take 40 mg by mouth daily.   finasteride (PROSCAR) 5 MG tablet Take 5 mg by mouth daily.   furosemide (LASIX) 40 MG tablet Take 1 tablet (40 mg total) by mouth daily.   gabapentin (NEURONTIN) 100 MG capsule Take 100 mg by mouth 2 (two) times daily.   glimepiride (AMARYL) 4 MG tablet Take 4 mg by mouth daily with breakfast.   glucose blood (ACCU-CHEK AVIVA PLUS) test strip    levothyroxine (SYNTHROID) 100 MCG tablet Take 1 tablet (100 mcg total) by mouth daily before breakfast.   losartan (COZAAR) 100 MG tablet Take 100 mg by mouth daily.   meclizine (ANTIVERT) 25 MG tablet Take 1 tablet (25 mg total) by mouth 3 (three) times daily as needed for dizziness.   metFORMIN (GLUCOPHAGE) 500 MG tablet Take 1 tablet (500 mg total) by mouth 2 (two) times daily with a meal. Resume on 04/13/21.   Multiple Vitamins-Minerals (ICAPS AREDS 2 PO) Take 1 Cartridge by mouth in the morning and at bedtime.    nitroGLYCERIN (NITROSTAT) 0.4 MG SL tablet Place 1 tablet (0.4 mg total) under the tongue every 5 (five) minutes as needed for chest pain.   NONFORMULARY OR COMPOUNDED ITEM [facility]:  Peripheral Neuropathy Cream - Bupivacaine 1%, doxepin 3%, Gabapentin 6%, Pentoxifylline 3%, Topiramate 1%, apply 1-2 grams to affected area 3-4 times daily. (Patient taking differently: [facility]:  Peripheral Neuropathy Cream - Bupivacaine 1%, doxepin 3%, Gabapentin 6%, Pentoxifylline 3%, Topiramate 1%, apply 1-2 grams to affected area 3-4 times daily.)   ondansetron (ZOFRAN) 4 MG tablet Take 1 tablet (4 mg total) by mouth every 8 (eight) hours as needed for nausea or vomiting.   ranolazine (RANEXA) 500 MG 12 hr tablet Take 1 tablet (500 mg total) by mouth 2 (two) times daily.   vitamin B-12 (CYANOCOBALAMIN) 1000 MCG tablet Take 1  tablet (1,000 mcg total) by mouth daily.   Wheat Dextrin (BENEFIBER PO) Take 1 tablet by mouth daily.     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  Tobacco Use   Smoking status: Former    Packs/day: 3.00    Types: Cigarettes   Smokeless tobacco: Never  Vaping Use   Vaping Use: Never used  Substance and Sexual Activity   Alcohol use: No   Drug use: No   Sexual activity: Not Currently  Other Topics Concern   Not on file  Social History Narrative   Not on file   Social Determinants of Health   Financial Resource Strain: Not on file  Food Insecurity: Not on file  Transportation Needs: Not on file  Physical Activity: Not on file  Stress: Not on file  Social Connections: Not on file     Family History: The patient's family  history includes Cancer in his father and mother; Diabetes in his father; Heart attack in his father; Hypertension in his father and mother.  ROS:   Please see the history of present illness.     All other systems reviewed and are negative.  EKGs/Labs/Other Studies Reviewed:    The following studies were reviewed today:  Heart monitor 01/15/20: Normal sinus rhythm Occasional PACs. Occasional brief runs of PAT longest lasting 18.5 seconds.   Staged PCI 12/13/19: Dist LAD-1 lesion is 60% stenosed. Prox Cx lesion is 99% stenosed. Prox Cx to Mid Cx lesion is 90% stenosed. 2nd Mrg lesion is 75% stenosed. Unsuccessful PCI due to inability to cross with a wire.   1. Unsuccessful PCI of the LCx due to inability to cross the lesion with a wire.    Plan: medical therapy. ASA only. Will increase amlodipine to 5 mg daily. Consider Ranexa if symptoms persists. Anticipate DC tomorrow am.   Heart cath 12/12/19: Mid LM lesion is 35% stenosed. Ost LAD to Mid LAD lesion is 100% stenosed. Prox Cx lesion is 99% stenosed. Prox Cx to Mid Cx lesion is 90% stenosed. 1st Mrg lesion is 100% stenosed. 2nd Mrg lesion is 75% stenosed. Prox RCA to Dist RCA lesion is 100% stenosed. LIMA graft was visualized by angiography and is normal in caliber. The graft exhibits no disease. Dist LAD-1 lesion is 60% stenosed. Dist LAD-2 lesion is 90% stenosed. SVG graft was visualized by angiography and is large. The graft exhibits minimal luminal irregularities. SVG graft was visualized by angiography and is normal in caliber. The graft exhibits mild . The left ventricular systolic function is normal. LV end diastolic pressure is normal. The left ventricular ejection fraction is 55-65% by visual estimate.   1. Complex 3 vessel obstructive CAD.  2. Patent LIMA to the LAD. There is severe disease in the distal LAD. The vessel is small in caliber and not suitable for PCI 3. Patent SVG to OM1 4. Patent SVG to RV  marginal branch. The RCA is occluded with left to right collaterals from the LCx to the distal RCA 5. Normal LV function 6. Normal LV filling pressures 7. Normal right heart pressures   Plan: The native LCX  is severely diseased proximally and in the mid vessel with aneurysmal dilation and heavy calcification. This supplies the second and third OM branches and collaterals to the distal RCA. This is a potential target for intervention. It will require atherectomy. Given progressive symptoms will admit to telemetry. Start IV heparin. Load with Plavix. Planned staged complex PCI of the LCx tomorrow.   Cardiac cath 04/09/21:  LEFT HEART CATH AND CORS/GRAFTS ANGIOGRAPHY   Conclusion      Mid LM lesion is 35% stenosed.   Ost LAD to Mid LAD lesion is 100% stenosed.   Dist LAD-1 lesion is 60% stenosed.   Dist LAD-2 lesion is 90% stenosed.   Prox RCA to Dist RCA lesion is 100% stenosed.   1st Mrg lesion is 100% stenosed.   2nd Mrg lesion is 75% stenosed.   Prox Cx lesion is 99% stenosed.   Prox Cx to Mid Cx lesion is 95% stenosed.   LIMA and is normal in caliber.   and is normal in caliber.   and is large.   The graft exhibits no disease.   The graft exhibits minimal luminal irregularities.   The graft exhibits mild .   The left ventricular systolic function is normal.   LV end diastolic pressure is normal.   The left ventricular ejection fraction is 55-65% by visual estimate.   Severe 3 vessel obstructive CAD Continued patency of bypass grafts including LIMA to LAD, SVG to OM1, SVG to RV marginal vessel Normal LV function Normal LVEDP   Plan: I suspect his culprit is the native LCx. This vessel is severely diseased, calcified and aneurysmal. Flow appears worse compared to prior study. We have attempted PCI of this in the past but unable to cross with a wire. There are no targets for PCI. Will continue medical therapy. Intolerant of higher Ranexa dose. Will increase amlodipine.    Echo  04/10/21:  IMPRESSIONS     1. Left ventricular ejection fraction, by estimation, is 60 to 65%. The  left ventricle has normal function. The left ventricle has no regional  wall motion abnormalities. Left ventricular diastolic parameters are  consistent with Grade I diastolic  dysfunction (impaired relaxation).   2. Right ventricular systolic function is normal. The right ventricular  size is normal.   3. The mitral valve is abnormal. Trivial mitral valve regurgitation. No  evidence of mitral stenosis.   4. The aortic valve was not well visualized. Aortic valve regurgitation  is not visualized. Mild to moderate aortic valve sclerosis/calcification  is present, without any evidence of aortic stenosis.   5. The inferior vena cava is normal in size with greater than 50%  respiratory variability, suggesting right atrial pressure of 3 mmHg.   EKG:  EKG is not ordered today.  Recent Labs: 04/10/2021: BUN 11; Creatinine, Ser 0.70; Potassium 3.8; Sodium 135 04/11/2021: Hemoglobin 10.9; Platelets 203  Recent Lipid Panel    Component Value Date/Time   CHOL 95 04/10/2021 0231   TRIG 102 04/10/2021 0231   HDL 27 (L) 04/10/2021 0231   CHOLHDL 3.5 04/10/2021 0231   VLDL 20 04/10/2021 0231   LDLCALC 48 04/10/2021 0231   Dated 04/16/21: A1c 6.8%.  Physical Exam:    VS:  There were no vitals taken for this visit.    Wt Readings from Last 3 Encounters:  04/09/21 182 lb 5.1 oz (82.7 kg)  03/29/21 182 lb 6.4 oz (82.7 kg)  10/16/20 171 lb (77.6 kg)     GEN:  Well  nourished, well developed in no acute distress HEENT: Normal NECK: No JVD; No carotid bruits LYMPHATICS: No lymphadenopathy CARDIAC: RRR, soft systolic murmur RESPIRATORY:  Clear to auscultation without rales, wheezing or rhonchi  ABDOMEN: Soft, non-tender, non-distended MUSCULOSKELETAL:  No edema; No deformity  SKIN: Warm and dry NEUROLOGIC:  Alert and oriented x 3 PSYCHIATRIC:  Normal affect   ASSESSMENT:    1. Coronary  artery disease involving coronary bypass graft of native heart with angina pectoris ([place])   2. DOE (dyspnea on exertion)   3. Essential hypertension   4. Hyperlipidemia LDL goal <70   5. Peripheral arterial disease ([facility])     PLAN:    CAD status post CABG 2006 (LIMA-LAD, SVG-RV marginal branch, SVG-OM1) - Recent heart catheterization with patent grafts - Chronically occluded LCX - previously unable to cross with wire.  - stable disease on heart cath in October. - continue medical management with ranexa and amlodipine, ASA, statin - not on BB - intolerant of nitrates.  - actually feeling much better today with higher amlodipine dose.  2. Hyperlipidemia with LDL goal < 70 - Continue 80 mg lipitor  3. DM2 - Per PCP - on metformin and glimiperide.   4. Hypertension - amlodipoine, losartan - BP control improved.   5. Paroxysmal atrial tachycardia - Noted on telemetry in hospital and heart monitor 2021 - not on a BB - no palpitations   6. PAD - ulcer on heel now healed.   Follow up in 4 months.  Medication Adjustments/Labs and Tests Ordered: Current medicines are reviewed at length with the patient today.  Concerns regarding medicines are outlined above.  No orders of the defined types were placed in this encounter.   No orders of the defined types were placed in this encounter.    Signed, Barbee Mamula [name], MD  05/26/2021 4:37 PM    [place]

## 2021-05-25 DIAGNOSIS — Z89421 Acquired absence of other right toe(s): Secondary | ICD-10-CM | POA: Diagnosis not present

## 2021-05-26 ENCOUNTER — Other Ambulatory Visit: Payer: Self-pay

## 2021-05-26 ENCOUNTER — Ambulatory Visit (INDEPENDENT_AMBULATORY_CARE_PROVIDER_SITE_OTHER): Payer: Medicare PPO | Admitting: Cardiology

## 2021-05-26 ENCOUNTER — Encounter: Payer: Self-pay | Admitting: Cardiology

## 2021-05-26 DIAGNOSIS — I25709 Atherosclerosis of coronary artery bypass graft(s), unspecified, with unspecified angina pectoris: Secondary | ICD-10-CM | POA: Diagnosis not present

## 2021-05-26 DIAGNOSIS — Z9181 History of falling: Secondary | ICD-10-CM | POA: Diagnosis not present

## 2021-05-26 DIAGNOSIS — I739 Peripheral vascular disease, unspecified: Secondary | ICD-10-CM

## 2021-05-26 DIAGNOSIS — I10 Essential (primary) hypertension: Secondary | ICD-10-CM | POA: Diagnosis not present

## 2021-05-26 DIAGNOSIS — Z0000 Encounter for general adult medical examination without abnormal findings: Secondary | ICD-10-CM | POA: Diagnosis not present

## 2021-05-26 DIAGNOSIS — E785 Hyperlipidemia, unspecified: Secondary | ICD-10-CM

## 2021-05-26 DIAGNOSIS — R0609 Other forms of dyspnea: Secondary | ICD-10-CM | POA: Diagnosis not present

## 2021-05-26 DIAGNOSIS — Z1331 Encounter for screening for depression: Secondary | ICD-10-CM | POA: Diagnosis not present

## 2021-06-03 DIAGNOSIS — R2689 Other abnormalities of gait and mobility: Secondary | ICD-10-CM | POA: Diagnosis not present

## 2021-06-04 ENCOUNTER — Emergency Department (HOSPITAL_COMMUNITY): Payer: Medicare PPO

## 2021-06-04 ENCOUNTER — Inpatient Hospital Stay (HOSPITAL_COMMUNITY)
Admission: EM | Admit: 2021-06-04 | Discharge: 2021-06-07 | DRG: 291 | Disposition: A | Discharge status: Home Health | Payer: Medicare PPO | Attending: Family Medicine | Admitting: Family Medicine

## 2021-06-04 ENCOUNTER — Other Ambulatory Visit: Payer: Self-pay

## 2021-06-04 DIAGNOSIS — J9811 Atelectasis: Secondary | ICD-10-CM | POA: Diagnosis not present

## 2021-06-04 DIAGNOSIS — I10 Essential (primary) hypertension: Secondary | ICD-10-CM | POA: Diagnosis present

## 2021-06-04 DIAGNOSIS — I739 Peripheral vascular disease, unspecified: Secondary | ICD-10-CM | POA: Diagnosis not present

## 2021-06-04 DIAGNOSIS — Z833 Family history of diabetes mellitus: Secondary | ICD-10-CM

## 2021-06-04 DIAGNOSIS — R52 Pain, unspecified: Secondary | ICD-10-CM

## 2021-06-04 DIAGNOSIS — N189 Chronic kidney disease, unspecified: Secondary | ICD-10-CM | POA: Diagnosis present

## 2021-06-04 DIAGNOSIS — R29703 NIHSS score 3: Secondary | ICD-10-CM | POA: Diagnosis present

## 2021-06-04 DIAGNOSIS — Z7982 Long term (current) use of aspirin: Secondary | ICD-10-CM | POA: Diagnosis not present

## 2021-06-04 DIAGNOSIS — Z85828 Personal history of other malignant neoplasm of skin: Secondary | ICD-10-CM

## 2021-06-04 DIAGNOSIS — R202 Paresthesia of skin: Secondary | ICD-10-CM

## 2021-06-04 DIAGNOSIS — R29898 Other symptoms and signs involving the musculoskeletal system: Secondary | ICD-10-CM

## 2021-06-04 DIAGNOSIS — R059 Cough, unspecified: Secondary | ICD-10-CM | POA: Diagnosis not present

## 2021-06-04 DIAGNOSIS — R200 Anesthesia of skin: Secondary | ICD-10-CM | POA: Diagnosis not present

## 2021-06-04 DIAGNOSIS — Z20822 Contact with and (suspected) exposure to covid-19: Secondary | ICD-10-CM | POA: Diagnosis present

## 2021-06-04 DIAGNOSIS — I110 Hypertensive heart disease with heart failure: Secondary | ICD-10-CM | POA: Diagnosis not present

## 2021-06-04 DIAGNOSIS — R531 Weakness: Secondary | ICD-10-CM | POA: Diagnosis present

## 2021-06-04 DIAGNOSIS — Z89421 Acquired absence of other right toe(s): Secondary | ICD-10-CM

## 2021-06-04 DIAGNOSIS — E1122 Type 2 diabetes mellitus with diabetic chronic kidney disease: Secondary | ICD-10-CM | POA: Diagnosis present

## 2021-06-04 DIAGNOSIS — R5383 Other fatigue: Secondary | ICD-10-CM | POA: Diagnosis not present

## 2021-06-04 DIAGNOSIS — Z91013 Allergy to seafood: Secondary | ICD-10-CM

## 2021-06-04 DIAGNOSIS — Z8546 Personal history of malignant neoplasm of prostate: Secondary | ICD-10-CM | POA: Diagnosis not present

## 2021-06-04 DIAGNOSIS — I130 Hypertensive heart and chronic kidney disease with heart failure and stage 1 through stage 4 chronic kidney disease, or unspecified chronic kidney disease: Secondary | ICD-10-CM | POA: Diagnosis present

## 2021-06-04 DIAGNOSIS — R1111 Vomiting without nausea: Secondary | ICD-10-CM | POA: Diagnosis not present

## 2021-06-04 DIAGNOSIS — Z79899 Other long term (current) drug therapy: Secondary | ICD-10-CM

## 2021-06-04 DIAGNOSIS — E039 Hypothyroidism, unspecified: Secondary | ICD-10-CM | POA: Diagnosis present

## 2021-06-04 DIAGNOSIS — Z951 Presence of aortocoronary bypass graft: Secondary | ICD-10-CM

## 2021-06-04 DIAGNOSIS — I5033 Acute on chronic diastolic (congestive) heart failure: Secondary | ICD-10-CM | POA: Diagnosis present

## 2021-06-04 DIAGNOSIS — I959 Hypotension, unspecified: Secondary | ICD-10-CM | POA: Diagnosis not present

## 2021-06-04 DIAGNOSIS — Z806 Family history of leukemia: Secondary | ICD-10-CM | POA: Diagnosis not present

## 2021-06-04 DIAGNOSIS — Z87891 Personal history of nicotine dependence: Secondary | ICD-10-CM

## 2021-06-04 DIAGNOSIS — E119 Type 2 diabetes mellitus without complications: Secondary | ICD-10-CM | POA: Diagnosis not present

## 2021-06-04 DIAGNOSIS — W19XXXA Unspecified fall, initial encounter: Secondary | ICD-10-CM | POA: Diagnosis not present

## 2021-06-04 DIAGNOSIS — Z7984 Long term (current) use of oral hypoglycemic drugs: Secondary | ICD-10-CM

## 2021-06-04 DIAGNOSIS — I509 Heart failure, unspecified: Secondary | ICD-10-CM

## 2021-06-04 DIAGNOSIS — I2510 Atherosclerotic heart disease of native coronary artery without angina pectoris: Secondary | ICD-10-CM | POA: Diagnosis present

## 2021-06-04 DIAGNOSIS — J9601 Acute respiratory failure with hypoxia: Secondary | ICD-10-CM | POA: Diagnosis present

## 2021-06-04 DIAGNOSIS — J811 Chronic pulmonary edema: Secondary | ICD-10-CM | POA: Diagnosis not present

## 2021-06-04 DIAGNOSIS — E7800 Pure hypercholesterolemia, unspecified: Secondary | ICD-10-CM | POA: Diagnosis present

## 2021-06-04 DIAGNOSIS — K219 Gastro-esophageal reflux disease without esophagitis: Secondary | ICD-10-CM | POA: Diagnosis present

## 2021-06-04 DIAGNOSIS — E1151 Type 2 diabetes mellitus with diabetic peripheral angiopathy without gangrene: Secondary | ICD-10-CM | POA: Diagnosis present

## 2021-06-04 DIAGNOSIS — R410 Disorientation, unspecified: Secondary | ICD-10-CM | POA: Diagnosis not present

## 2021-06-04 DIAGNOSIS — R4182 Altered mental status, unspecified: Secondary | ICD-10-CM | POA: Diagnosis not present

## 2021-06-04 DIAGNOSIS — Y92009 Unspecified place in unspecified non-institutional (private) residence as the place of occurrence of the external cause: Secondary | ICD-10-CM

## 2021-06-04 DIAGNOSIS — Z79890 Hormone replacement therapy (postmenopausal): Secondary | ICD-10-CM

## 2021-06-04 DIAGNOSIS — R270 Ataxia, unspecified: Secondary | ICD-10-CM | POA: Diagnosis not present

## 2021-06-04 DIAGNOSIS — N400 Enlarged prostate without lower urinary tract symptoms: Secondary | ICD-10-CM | POA: Diagnosis present

## 2021-06-04 DIAGNOSIS — R109 Unspecified abdominal pain: Secondary | ICD-10-CM | POA: Diagnosis not present

## 2021-06-04 DIAGNOSIS — E876 Hypokalemia: Secondary | ICD-10-CM | POA: Diagnosis present

## 2021-06-04 DIAGNOSIS — K5900 Constipation, unspecified: Secondary | ICD-10-CM | POA: Diagnosis present

## 2021-06-04 DIAGNOSIS — Z8249 Family history of ischemic heart disease and other diseases of the circulatory system: Secondary | ICD-10-CM

## 2021-06-04 DIAGNOSIS — I252 Old myocardial infarction: Secondary | ICD-10-CM

## 2021-06-04 DIAGNOSIS — I6523 Occlusion and stenosis of bilateral carotid arteries: Secondary | ICD-10-CM | POA: Diagnosis not present

## 2021-06-04 DIAGNOSIS — I517 Cardiomegaly: Secondary | ICD-10-CM | POA: Diagnosis not present

## 2021-06-04 DIAGNOSIS — R079 Chest pain, unspecified: Secondary | ICD-10-CM | POA: Diagnosis not present

## 2021-06-04 LAB — COMPREHENSIVE METABOLIC PANEL
ALT: 19 U/L (ref 0–44)
AST: 21 U/L (ref 15–41)
Albumin: 3.7 g/dL (ref 3.5–5.0)
Alkaline Phosphatase: 67 U/L (ref 38–126)
Anion gap: 8 (ref 5–15)
BUN: 20 mg/dL (ref 8–23)
CO2: 28 mmol/L (ref 22–32)
Calcium: 9.7 mg/dL (ref 8.9–10.3)
Chloride: 97 mmol/L — ABNORMAL LOW (ref 98–111)
Creatinine, Ser: 0.86 mg/dL (ref 0.61–1.24)
GFR, Estimated: >60 mL/min (ref >60)
Glucose, Bld: 90 mg/dL (ref 70–99)
Potassium: 4.1 mmol/L (ref 3.5–5.1)
Sodium: 133 mmol/L — ABNORMAL LOW (ref 135–145)
Total Bilirubin: 0.9 mg/dL (ref 0.3–1.2)
Total Protein: 6.6 g/dL (ref 6.5–8.1)

## 2021-06-04 LAB — I-STAT CHEM 8, ED
BUN: 23 mg/dL (ref 8–23)
Calcium, Ion: 1.27 mmol/L (ref 1.15–1.40)
Chloride: 97 mmol/L — ABNORMAL LOW (ref 98–111)
Creatinine, Ser: 0.8 mg/dL (ref 0.61–1.24)
Glucose, Bld: 86 mg/dL (ref 70–99)
HCT: 32 % — ABNORMAL LOW (ref 39.0–52.0)
Hemoglobin: 10.9 g/dL — ABNORMAL LOW (ref 13.0–17.0)
Potassium: 4.2 mmol/L (ref 3.5–5.1)
Sodium: 136 mmol/L (ref 135–145)
TCO2: 30 mmol/L (ref 22–32)

## 2021-06-04 LAB — PROTIME-INR
INR: 1 (ref 0.8–1.2)
Prothrombin Time: 12.9 seconds (ref 11.4–15.2)

## 2021-06-04 LAB — CBC
HCT: 32.7 % — ABNORMAL LOW (ref 39.0–52.0)
Hemoglobin: 10.8 g/dL — ABNORMAL LOW (ref 13.0–17.0)
MCH: 32.6 pg (ref 26.0–34.0)
MCHC: 33 g/dL (ref 30.0–36.0)
MCV: 98.8 fL (ref 80.0–100.0)
Platelets: 198 10*3/uL (ref 150–400)
RBC: 3.31 MIL/uL — ABNORMAL LOW (ref 4.22–5.81)
RDW: 12.5 % (ref 11.5–15.5)
WBC: 8.7 10*3/uL (ref 4.0–10.5)
nRBC: 0 % (ref 0.0–0.2)

## 2021-06-04 LAB — RESP PANEL BY RT-PCR (FLU A&B, COVID) ARPGX2
Influenza A by PCR: NEGATIVE
Influenza B by PCR: NEGATIVE
SARS Coronavirus 2 by RT PCR: NEGATIVE

## 2021-06-04 LAB — DIFFERENTIAL
Abs Immature Granulocytes: 0.03 10*3/uL (ref 0.00–0.07)
Basophils Absolute: 0 10*3/uL (ref 0.0–0.1)
Basophils Relative: 1 %
Eosinophils Absolute: 0.1 10*3/uL (ref 0.0–0.5)
Eosinophils Relative: 1 %
Immature Granulocytes: 0 %
Lymphocytes Relative: 31 %
Lymphs Abs: 2.7 10*3/uL (ref 0.7–4.0)
Monocytes Absolute: 0.7 10*3/uL (ref 0.1–1.0)
Monocytes Relative: 8 %
Neutro Abs: 5.1 10*3/uL (ref 1.7–7.7)
Neutrophils Relative %: 59 %

## 2021-06-04 LAB — TSH: TSH: 0.585 u[IU]/mL (ref 0.350–4.500)

## 2021-06-04 LAB — TROPONIN I (HIGH SENSITIVITY): Troponin I (High Sensitivity): 7 ng/L (ref <18)

## 2021-06-04 LAB — APTT: aPTT: 30 seconds (ref 24–36)

## 2021-06-04 LAB — CBG MONITORING, ED: Glucose-Capillary: 83 mg/dL (ref 70–99)

## 2021-06-04 LAB — AMMONIA: Ammonia: 23 umol/L (ref 9–35)

## 2021-06-04 MED ORDER — SODIUM CHLORIDE 0.9% FLUSH
3.0000 mL | Freq: Once | INTRAVENOUS | Status: AC
Start: 2021-06-04 — End: 2021-06-04
  Administered 2021-06-04: 3 mL via INTRAVENOUS

## 2021-06-04 MED ORDER — IOHEXOL 350 MG/ML SOLN
65.0000 mL | Freq: Once | INTRAVENOUS | Status: AC | PRN
  Administered 2021-06-04: 65 mL via INTRAVENOUS

## 2021-06-04 MED ORDER — ONDANSETRON HCL 4 MG/2ML IJ SOLN
INTRAMUSCULAR | Status: AC
  Administered 2021-06-04: 4 mg
  Filled 2021-06-04: qty 2

## 2021-06-04 NOTE — Code Documentation (Signed)
Stroke Response Nurse Documentation Code Documentation  Alexander Choi is a 87 y.o. male arriving to [facility] ED via [facility] on 06/04/2021 with past medical hx of HTN, DM, CABG,. On No antithrombotic. Code stroke was activated by EMS.   Patient from home where he was LKW 3 days ago and now complaining of Left side weakness.  Per family and patient he has had some left side weakness the past 3 days, he has started to use a cane.  This afternoon about 2pm he fell when he tried to get up from his recliner.  He had more difficulty moving his left arm and leg than the past few days.   Stroke team at the bedside on patient arrival. Labs drawn and patient cleared for CT by EDP. Patient to CT with team. NIHSS 3, see documentation for details and code stroke times. Patient with right limb ataxia, left decreased sensation, and dysarthria  on exam. The following imaging was completed:  CT, CTA head and neck. Patient is not a candidate for IV Thrombolytic due to being outside the window. Patient is not a candidate for IR due to No LVO on CTA.   Care/Plan: neuro checks and VS q 2 hours.   Bedside handoff with ED RN [place].    Raliegh Ip  Stroke Response RN

## 2021-06-04 NOTE — ED Provider Notes (Signed)
4:16 PM Care assumed from Dr. Langston Masker.  At time of transfer care, patient is awaiting for recommendations from neurology in regards to disposition or further imaging.  Anticipate following up on the recommendations.   Patient's MRI did not show evidence of stroke.  I spoke to neurology who felt that as there is no stroke he could otherwise be worked up for the altered mental status and fatigue.  When I reassessed the patient, I informed him that there was no stroke.  They were relieved but patient was having some left-sided chest discomfort.  He also felt warm and is complaining that he still feels altered and fatigued.  He does report that his urine has felt different.  X-ray was obtained showing no pneumonia.  EKG will be obtained.  Anticipate following up on the troponins, COVID test for feeling warm and altered, urinalysis, ammonia, TSH.  Neurology did recommend getting the ammonia and TSH as well.  Anticipate reassessment but if he is still altered and not at his baseline, anticipate he may need admission.  EKG appeared unchanged from prior.  TSH not elevated.  Initial troponin normal, will trend.  Due to the altered mental status, still waiting on urinalysis.  Anticipate reassessment of mental status to determine disposition.  Care transferred to oncoming team awaiting urinalysis and reassessment.       Caden Fatica, Gwenyth Allegra, MD 06/04/21 [phone]

## 2021-06-04 NOTE — ED Provider Notes (Signed)
[place] EMERGENCY DEPARTMENT Provider Note   CSN: [id number] Arrival date & time: 06/04/21  1505     History No chief complaint on file.   Alexander Choi is a 87 y.o. male presenting to emergency department as a code stroke with concern for left lower leg weakness.  There is some initial confusion about the patient's last known well, reported around 2:00 today, but upon further clarification appears the patient has had left leg weakness for several days.  He says he is not able to get out of recliner today due to weakness in his left leg.  HPI     Past Medical History:  Diagnosis Date   Cancer ([place])    Chronic kidney disease    kidney stones   Coronary artery disease    with CABG in 2006 with LIMA to LAD, SVG to mid and distal OM, SVG to AM. Normal Myoview in May of 2013   Diabetes mellitus    Type 2   GERD (gastroesophageal reflux disease)    Hematuria    Hypercholesterolemia    Hypertension    Irritable bowel syndrome    Loose stools    Prostate cancer [facility])    Urinary obstruction     Patient Active Problem List   Diagnosis Date Noted   NSTEMI (non-ST elevated myocardial infarction) ([place]) 04/09/2021   Peripheral arterial disease ([place]) 10/16/2020   Atrial tachycardia ([place]) 12/16/2019   Bradycardia 12/16/2019   Unstable angina ([place]) 12/12/2019   Coronary artery disease    Hypertension    Hypercholesterolemia    Diabetes mellitus ([place])     Past Surgical History:  Procedure Laterality Date   CHOLECYSTECTOMY     CORONARY ARTERY BYPASS GRAFT  2006   LEFT HEART CATH AND CORS/GRAFTS ANGIOGRAPHY N/A 04/09/2021   Procedure: LEFT HEART CATH AND CORS/GRAFTS ANGIOGRAPHY;  Surgeon: [name], Peter M, MD;  Location: [place] CV LAB;  Service: Cardiovascular;  Laterality: N/A;   LEFT HEART CATHETERIZATION WITH CORONARY/GRAFT ANGIOGRAM N/A 04/11/2014   Procedure: LEFT HEART CATHETERIZATION WITH [name];  Surgeon: Peter M [name], MD;   Location: [facility] CATH LAB;  Service: Cardiovascular;  Laterality: N/A;   OTHER SURGICAL HISTORY     bowel obstruction   RADIOACTIVE SEED IMPLANT     for prostate cancer   RIGHT/LEFT HEART CATH AND CORONARY/GRAFT ANGIOGRAPHY N/A 12/12/2019   Procedure: RIGHT/LEFT HEART CATH AND CORONARY/GRAFT ANGIOGRAPHY;  Surgeon: [name], Peter M, MD;  Location: [place] CV LAB;  Service: Cardiovascular;  Laterality: N/A;   SKIN CANCER EXCISION     TEMPORARY PACEMAKER N/A 12/13/2019   Procedure: TEMPORARY PACEMAKER;  Surgeon: [name], Peter M, MD;  Location: [place] CV LAB;  Service: Cardiovascular;  Laterality: N/A;       Family History  Problem Relation Age of Onset   Cancer Mother        Leukemia   Hypertension Mother    Cancer Father        Colon   Heart attack Father        Had Several in his 12's   Hypertension Father    Diabetes Father     Social History   Tobacco Use   Smoking status: Former    Packs/day: 3.00    Types: Cigarettes   Smokeless tobacco: Never  Vaping Use   Vaping Use: Never used  Substance Use Topics   Alcohol use: No   Drug use: No    Home Medications Prior to Admission  medications   Medication Sig Start Date End Date Taking? Authorizing Provider  Accu-Chek Softclix Lancets lancets  07/04/19   [provider]  Accu-Chek Softclix Lancets lancets  12/23/18   [provider]  albuterol (VENTOLIN HFA) 108 (90 Base) MCG/ACT inhaler Inhale 2 puffs into the lungs every 6 (six) hours as needed for wheezing or shortness of breath. 10/01/20   [name], Peter M, MD  amLODipine (NORVASC) 5 MG tablet Take 1.5 tablets (7.5 mg total) by mouth at bedtime. 04/11/21   Ledora Bottcher, PA  aspirin 81 MG tablet Take 81 mg by mouth daily.    [provider]  atorvastatin (LIPITOR) 80 MG tablet Take 1 tablet (80 mg total) by mouth daily. 12/16/19   Duke, Tami Lin, PA  famotidine (PEPCID) 40 MG tablet Take 40 mg by mouth daily. 03/22/21   [provider]  finasteride (PROSCAR) 5 MG tablet Take 5 mg by mouth daily. 05/18/20   [provider]  furosemide (LASIX) 40 MG tablet Take 1 tablet (40 mg total) by mouth daily. 10/01/20   [name], Peter M, MD  gabapentin (NEURONTIN) 100 MG capsule Take 100 mg by mouth 2 (two) times daily. 10/01/20   [name], Peter M, MD  glimepiride (AMARYL) 4 MG tablet Take 4 mg by mouth daily with breakfast. 10/01/20   [name], Peter M, MD  glucose blood (ACCU-CHEK AVIVA PLUS) test strip  07/03/17   [provider]  levothyroxine (SYNTHROID) 100 MCG tablet Take 1 tablet (100 mcg total) by mouth daily before breakfast. 10/01/20   [name], Peter M, MD  losartan (COZAAR) 100 MG tablet Take 100 mg by mouth daily. 05/31/18   [provider]  meclizine (ANTIVERT) 25 MG tablet Take 1 tablet (25 mg total) by mouth 3 (three) times daily as needed for dizziness. 10/01/20   [name], Peter M, MD  metFORMIN (GLUCOPHAGE) 500 MG tablet Take 1 tablet (500 mg total) by mouth 2 (two) times daily with a meal. Resume on 04/13/21. 04/11/21   Duke, Tami Lin, PA  Multiple Vitamins-Minerals (ICAPS AREDS 2 PO) Take 1 Cartridge by mouth in the morning and at bedtime.    [provider]  nitroGLYCERIN (NITROSTAT) 0.4 MG SL tablet Place 1 tablet (0.4 mg total) under the tongue every 5 (five) minutes as needed for chest pain. 06/05/15   [name], Peter M, MD  NONFORMULARY OR COMPOUNDED [place]:  Peripheral Neuropathy Cream - Bupivacaine 1%, doxepin 3%, Gabapentin 6%, Pentoxifylline 3%, Topiramate 1%, apply 1-2 grams to affected area 3-4 times daily. Patient taking differently: [facility]:  Peripheral Neuropathy Cream - Bupivacaine 1%, doxepin 3%, Gabapentin 6%, Pentoxifylline 3%, Topiramate 1%, apply 1-2 grams to affected area 3-4 times daily. 02/25/16   Stover, Titorya, DPM  ondansetron (ZOFRAN) 4 MG tablet Take 1 tablet (4 mg total) by mouth every 8 (eight) hours as needed for nausea or vomiting.  10/01/20   [name], Peter M, MD  pantoprazole (PROTONIX) 40 MG tablet Take 1 tablet (40 mg total) by mouth daily. Patient taking differently: Take 40 mg by mouth 2 (two) times daily. 09/17/19 04/09/21  Noemi Chapel, MD  ranolazine (RANEXA) 500 MG 12 hr tablet Take 1 tablet (500 mg total) by mouth 2 (two) times daily. 04/11/21   Duke, Tami Lin, PA  vitamin B-12 (CYANOCOBALAMIN) 1000 MCG tablet Take 1 tablet (1,000 mcg total) by mouth daily. 10/01/20   [name], Peter M, MD  Wheat Dextrin (BENEFIBER PO) Take 1 tablet by mouth daily.  [provider]    Allergies    Ace inhibitors; Nitrates, organic; Shellfish allergy; and Penicillins  Review of Systems   Review of Systems  Constitutional:  Negative for chills and fever.  Eyes:  Negative for pain and visual disturbance.  Respiratory:  Negative for cough and shortness of breath.   Cardiovascular:  Negative for chest pain and palpitations.  Gastrointestinal:  Negative for abdominal pain and vomiting.  Musculoskeletal:  Positive for arthralgias and myalgias.  Skin:  Negative for color change and rash.  Neurological:  Positive for weakness and numbness.  All other systems reviewed and are negative.  Physical Exam Updated Vital Signs Wt 87 kg   BMI 27.52 kg/m   Physical Exam Constitutional:      General: He is not in acute distress. HENT:     Head: Normocephalic and atraumatic.  Eyes:     Conjunctiva/sclera: Conjunctivae normal.     Pupils: Pupils are equal, round, and reactive to light.  Cardiovascular:     Rate and Rhythm: Normal rate and regular rhythm.  Pulmonary:     Effort: Pulmonary effort is normal. No respiratory distress.  Abdominal:     General: There is no distension.     Tenderness: There is no abdominal tenderness.  Skin:    General: Skin is warm and dry.  Neurological:     Mental Status: He is alert. Mental status is at baseline.     Sensory: No sensory deficit.     Motor: Weakness present.   Psychiatric:        Mood and Affect: Mood normal.        Behavior: Behavior normal.    ED Results / Procedures / Treatments   Labs (all labs ordered are listed, but only abnormal results are displayed) Labs Reviewed  CBC - Abnormal; Notable for the following components:      Result Value   RBC 3.31 (*)    Hemoglobin 10.8 (*)    HCT 32.7 (*)    All other components within normal limits  I-STAT CHEM 8, ED - Abnormal; Notable for the following components:   Chloride 97 (*)    Hemoglobin 10.9 (*)    HCT 32.0 (*)    All other components within normal limits  PROTIME-INR  APTT  DIFFERENTIAL  COMPREHENSIVE METABOLIC PANEL  CBG MONITORING, ED    EKG None  Radiology CT HEAD CODE STROKE WO CONTRAST  Result Date: 06/04/2021 CLINICAL DATA:  Code stroke.  Left-sided weakness EXAM: CT HEAD WITHOUT CONTRAST TECHNIQUE: Contiguous axial images were obtained from the base of the skull through the vertex without intravenous contrast. COMPARISON:  04/26/2020 FINDINGS: Brain: No evidence of acute infarction, hemorrhage, cerebral edema, mass, mass effect, or midline shift. Ventricles and sulci are normal for age. No extra-axial fluid collection. Periventricular white matter changes, likely the sequela of chronic small vessel ischemic disease. Mild cerebral atrophy. Vascular: No hyperdense vessel or unexpected calcification. Skull: Normal. Negative for fracture or focal lesion. Sinuses/Orbits: Mild mucosal thickening in the ethmoid air cells and right maxillary sinus. Status post bilateral lens replacements. Other: The mastoid air cells are well aerated. ASPECTS [facility] Stroke Program Early CT Score) - Ganglionic level infarction (caudate, lentiform nuclei, internal capsule, insula, M1-M3 cortex): 7 - Supraganglionic infarction (M4-M6 cortex): 3 Total score (0-10 with 10 being normal): 10 IMPRESSION: 1. No acute intracranial process. 2. ASPECTS is 10 Code stroke imaging results were communicated on  06/04/2021 at 3:22 pm to provider Dr. Lorrin Goodell via secure  text paging. Electronically Signed   By: Merilyn Baba M.D.   On: 06/04/2021 15:22    Procedures Procedures   Medications Ordered in ED Medications  sodium chloride flush (NS) 0.9 % injection 3 mL (has no administration in time range)  ondansetron (ZOFRAN) 4 MG/2ML injection (4 mg  Given 06/04/21 1519)    ED Course  I have reviewed the triage vital signs and the nursing notes.  Pertinent labs & imaging results that were available during my care of the patient were reviewed by me and considered in my medical decision making (see chart for details).  Patient arrives with left leg weakness.  Differential diagnosis includes stroke versus lumbar radiculopathy versus other.  CTH with no acute infarct noted, CTA pending Neuro consulted Glucose wnl IMPRESSION:  1. No acute intracranial process.  2. ASPECTS is 10   Signed out to Dr Glynda Jaeger at 4 pm pending f/u on results, neuro consult Initial labs resulting wnl    Final Clinical Impression(s) / ED Diagnoses Final diagnoses:  None    Rx / DC Orders ED Discharge Orders     None        Kim Oki, Carola Rhine, MD 06/04/21 1712

## 2021-06-04 NOTE — ED Triage Notes (Signed)
Pt BIB EMS due to a code stroke. Pt was last seen normal 2-3 days ago and stated his left leg gave out and has been using a cane since. Pt states he fell out of his recliner at 2pm today and that's why he called 911. Pt is axox4. VSS. Pt presents with left sided weakness.

## 2021-06-04 NOTE — Consult Note (Signed)
NEUROLOGY CONSULTATION NOTE   Date of service: June 04, 2021 Patient Name: Alexander Choi MRN:  [id number] DOB:  Nov 24, 1932 Reason for consult: "stroke code" Requesting Provider: Wyvonnia Dusky, MD _ _ _   _ __   _ __ _ _  __ __   _ __   __ _  History of Present Illness  Alexander Choi is a 87 y.o. male with PMH significant for hypertension, DM2, CAD status post CABG, prior history of NSTEMI, right total ostial status post amputation who presents with vertigo and nausea and a fall from recliner.  Patient reports that he has been off balance for the last 3 days and has not been feeling strong on his feet.  He reports specifically that his left leg feels much weaker and he has been using his cane.  He was noted to be very somnolent and tired by his family yesterday around suppertime.  Today in the afternoon around 1400 on 06/04/2021, patient was try to get out of his recliner and he fell and he hit the back of the right side of his head.  Family is also reporting that today he has been much more confused.  Patient has had intermittent history of vertigo and nausea spending several years.  Family reports that his last episode of nausea and vertigo was in February.  He is very sensitive to nausea medication and has just started taking some medication for vertigo that is prescribed by his PCP although they are not entirely sure what that medication is.   Work-up here with CT head without contrast with no acute intracranial abnormality.  No large surgery stroke and no ICH.  mRS: 0 LKW: 06/02/2021. TNKase/thrombectomy: Not offered as patient is outside the window for any intervention. NIHSS components Score: Comment  1a Level of Conscious 0[x]  1[]  2[]  3[]      1b LOC Questions 0[x]  1[]  2[]       1c LOC Commands 0[x]  1[]  2[]       2 Best Gaze 0[x]  1[]  2[]       3 Visual 0[x]  1[]  2[]  3[]      4 Facial Palsy 0[x]  1[]  2[]  3[]      5a Motor Arm - left 0[x]  1[]  2[]  3[]  4[]  UN[]    5b Motor Arm - Right  0[x]  1[]  2[]  3[]  4[]  UN[]    6a Motor Leg - Left 0[x]  1[]  2[]  3[]  4[]  UN[]    6b Motor Leg - Right 0[x]  1[]  2[]  3[]  4[]  UN[]    7 Limb Ataxia 0[]  1[x]  2[]  3[]  UN[]     8 Sensory 0[]  1[x]  2[]  UN[]      9 Best Language 0[x]  1[]  2[]  3[]      10 Dysarthria 0[]  1[x]  2[]  UN[]      11 Extinct. and Inattention 0[]  1[]  2[]       TOTAL: 3     ROS   Unable to get detailed ROS 2/2 somnolence and confusion but endorses blurred vision + L leg weakness + vertigo with nausea.  Past History   Past Medical History:  Diagnosis Date   Cancer [facility])    Chronic kidney disease    kidney stones   Coronary artery disease    with CABG in 2006 with LIMA to LAD, SVG to mid and distal OM, SVG to AM. Normal Myoview in May of 2013   Diabetes mellitus    Type 2   GERD (gastroesophageal reflux disease)    Hematuria    Hypercholesterolemia    Hypertension    Irritable  bowel syndrome    Loose stools    Prostate cancer ([place])    Urinary obstruction    Past Surgical History:  Procedure Laterality Date   CHOLECYSTECTOMY     CORONARY ARTERY BYPASS GRAFT  2006   LEFT HEART CATH AND CORS/GRAFTS ANGIOGRAPHY N/A 04/09/2021   Procedure: LEFT HEART CATH AND CORS/GRAFTS ANGIOGRAPHY;  Surgeon: [name], Peter M, MD;  Location: [place] CV LAB;  Service: Cardiovascular;  Laterality: N/A;   LEFT HEART CATHETERIZATION WITH CORONARY/GRAFT ANGIOGRAM N/A 04/11/2014   Procedure: LEFT HEART CATHETERIZATION WITH [name];  Surgeon: Peter M [name], MD;  Location: [facility] CATH LAB;  Service: Cardiovascular;  Laterality: N/A;   OTHER SURGICAL HISTORY     bowel obstruction   RADIOACTIVE SEED IMPLANT     for prostate cancer   RIGHT/LEFT HEART CATH AND CORONARY/GRAFT ANGIOGRAPHY N/A 12/12/2019   Procedure: RIGHT/LEFT HEART CATH AND CORONARY/GRAFT ANGIOGRAPHY;  Surgeon: [name], Peter M, MD;  Location: [place] CV LAB;  Service: Cardiovascular;  Laterality: N/A;   SKIN CANCER EXCISION     TEMPORARY PACEMAKER N/A 12/13/2019    Procedure: TEMPORARY PACEMAKER;  Surgeon: [name], Peter M, MD;  Location: [place] CV LAB;  Service: Cardiovascular;  Laterality: N/A;   Family History  Problem Relation Age of Onset   Cancer Mother        Leukemia   Hypertension Mother    Cancer Father        Colon   Heart attack Father        Had Several in his 48's   Hypertension Father    Diabetes Father    Social History   Socioeconomic History   Marital status: Widowed    Spouse name: Not on file   Number of children: 3   Years of education: Not on file   Highest education level: Not on file  Occupational History   Occupation: state trooper    Comment: retired   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  Tobacco Use   Smoking status: Former    Packs/day: 3.00    Types: Cigarettes   Smokeless tobacco: Never  Vaping Use   Vaping Use: Never used  Substance and Sexual Activity   Alcohol use: No   Drug use: No   Sexual activity: Not Currently  Other Topics Concern   Not on file  Social History Narrative   Not on file   Social Determinants of Health   Financial Resource Strain: Not on file  Food Insecurity: Not on file  Transportation Needs: Not on file  Physical Activity: Not on file  Stress: Not on file  Social Connections: Not on file   Allergies  Allergen Reactions   Ace Inhibitors Other (See Comments)    unknown   Nitrates, Organic Swelling    Swelling in hands   Shellfish Allergy Nausea Only    Pt states scallops-not all shellfish-GI symptoms/ nausea -50+ years ago   Penicillins Rash    Medications  (Not in a hospital admission)    Vitals   Vitals:   06/04/21 1500  Weight: 87 kg     Body mass index is 27.52 kg/m.  Physical Exam   General: Laying comfortably in bed; in no acute distress.  HENT: Normal oropharynx and mucosa. Normal external appearance of ears and nose.  Neck: Supple, no pain or tenderness  CV: No JVD. No peripheral edema.  Pulmonary: Symmetric Chest rise. Normal respiratory  effort.  Abdomen: Soft to touch, non-tender.  Ext: No cyanosis, edema, or  deformity  Skin: No rash. Normal palpation of skin.   Musculoskeletal: Normal digits and nails by inspection. No clubbing.   Neurologic Examination  Mental status/Cognition: Somnolent and hard of hearing, oriented to self, month and year, poor attention. Speech/language: Fluent, dysarthric speech, comprehension intact, object naming intact, repetition intact. Cranial nerves:   CN II Pupils equal and reactive to light, no VF deficits(however difficutl to assess visual fields 2/2 confusion)   CN III,IV,VI EOM intact, no gaze preference or deviation, no nystagmus   CN V Mildly decreased in L face.   CN VII no asymmetry, no nasolabial fold flattening   CN VIII Hard of hearing.   CN IX & X normal palatal elevation, no uvular deviation   CN XI 5/5 head turn and 5/5 shoulder shrug bilaterally   CN XII midline tongue protrusion   Motor:  Muscle bulk: poor, tone normal Mvmt Root Nerve  Muscle Right Left Comments  SA C5/6 Ax Deltoid     EF C5/6 Mc Biceps 5 5   EE C6/7/8 Rad Triceps 5 5   WF C6/7 Med FCR     WE C7/8 PIN ECU     F Ab C8/T1 U ADM/FDI 5 4+   HF L1/2/3 Fem Illopsoas 4 4   KE L2/3/4 Fem Quad     DF L4/5 D Peron Tib Ant 4 4   PF S1/2 Tibial Grc/Sol 4 4    Reflexes:  Right Left Comments  Pectoralis      Biceps (C5/6) 2 2   Brachioradialis (C5/6) 2 2    Triceps (C6/7) 2 2    Patellar (L3/4) 2 2    Achilles (S1)      Hoffman      Plantar     Jaw jerk    Sensation:  Light touch Decreased in L face and LUE.   Pin prick    Temperature    Vibration   Proprioception    Coordination/Complex Motor:  - Finger to Nose intact BL but with tremor. - Heel to shin with ataxia in RLE more so than LLE. - Rapid alternating movement are slow throughout - Gait: Deferred.  Labs   CBC:  Recent Labs  Lab 06/04/21 1509 06/04/21 1511  WBC 8.7  --   NEUTROABS 5.1  --   HGB 10.8* 10.9*  HCT 32.7* 32.0*   MCV 98.8  --   PLT 198  --     Basic Metabolic Panel:  Lab Results  Component Value Date   NA 136 06/04/2021   K 4.2 06/04/2021   CO2 28 04/10/2021   GLUCOSE 86 06/04/2021   BUN 23 06/04/2021   CREATININE 0.80 06/04/2021   CALCIUM 8.8 (L) 04/10/2021   GFRNONAA >60 04/10/2021   GFRAA >60 01/08/2020   Lipid Panel:  Lab Results  Component Value Date   LDLCALC 48 04/10/2021   HgbA1c:  Lab Results  Component Value Date   HGBA1C 6.8 (H) 12/12/2019   Urine Drug Screen: No results found for: LABOPIA, COCAINSCRNUR, LABBENZ, AMPHETMU, THCU, LABBARB  Alcohol Level No results found for: [place]  CT Head without contrast(Personally reviewed): CTH was negative for a large hypodensity concerning for a large territory infarct or hyperdensity concerning for an ICH  CT angio Head and Neck with contrast(Personally reviewed): No LVO on my evaluation.  MRI Brain: pending Impression   ZYLON CREAMER is a 87 y.o. male with PMH significant for hypertension, DM2, CAD status post CABG, prior history of NSTEMI, right total  ostial status post amputation who presents with vertigo, nausea, RLE ataxia, subjective LLE weakness and a fall from recliner today. Weakness in LLE started 3 days ago, has had off an on vertigo over the years. Outside the window for tNKAse, no LVO and thus not a candidate for thrombectomy.  With his symptoms, I want to rule out a posterior circulation stroke. Stroke would not explain his encephalopathy but this could be from nausea meds or metabolic in nature.  Recommendations  - MRI Brain w/o contrast. Further workup pending MRI Brain. - encephalopathy workup with CBC, chemistry, infectious screen with COVID and Flu PCR. ______________________________________________________________________   Thank you for the opportunity to take part in the care of this patient. If you have any further questions, please contact the neurology consultation attending.  Signed,  [place] Pager Number [phone] _ _ _   _ __   _ __ _ _  __ __   _ __   __ _

## 2021-06-04 NOTE — ED Notes (Signed)
RN and NT cleaned up pt. New diaper and chuck applied

## 2021-06-05 ENCOUNTER — Observation Stay (HOSPITAL_COMMUNITY): Payer: Medicare PPO

## 2021-06-05 DIAGNOSIS — I509 Heart failure, unspecified: Secondary | ICD-10-CM

## 2021-06-05 DIAGNOSIS — Y92009 Unspecified place in unspecified non-institutional (private) residence as the place of occurrence of the external cause: Secondary | ICD-10-CM

## 2021-06-05 DIAGNOSIS — E039 Hypothyroidism, unspecified: Secondary | ICD-10-CM

## 2021-06-05 DIAGNOSIS — R531 Weakness: Secondary | ICD-10-CM | POA: Diagnosis not present

## 2021-06-05 DIAGNOSIS — J9601 Acute respiratory failure with hypoxia: Secondary | ICD-10-CM | POA: Diagnosis not present

## 2021-06-05 DIAGNOSIS — E119 Type 2 diabetes mellitus without complications: Secondary | ICD-10-CM

## 2021-06-05 DIAGNOSIS — I5033 Acute on chronic diastolic (congestive) heart failure: Secondary | ICD-10-CM | POA: Diagnosis not present

## 2021-06-05 DIAGNOSIS — W19XXXA Unspecified fall, initial encounter: Secondary | ICD-10-CM

## 2021-06-05 DIAGNOSIS — I2510 Atherosclerotic heart disease of native coronary artery without angina pectoris: Secondary | ICD-10-CM | POA: Diagnosis not present

## 2021-06-05 DIAGNOSIS — E7800 Pure hypercholesterolemia, unspecified: Secondary | ICD-10-CM

## 2021-06-05 DIAGNOSIS — I739 Peripheral vascular disease, unspecified: Secondary | ICD-10-CM

## 2021-06-05 DIAGNOSIS — I10 Essential (primary) hypertension: Secondary | ICD-10-CM

## 2021-06-05 LAB — GLUCOSE, CAPILLARY
Glucose-Capillary: 137 mg/dL — ABNORMAL HIGH (ref 70–99)
Glucose-Capillary: 198 mg/dL — ABNORMAL HIGH (ref 70–99)

## 2021-06-05 LAB — URINALYSIS, ROUTINE W REFLEX MICROSCOPIC
Bilirubin Urine: NEGATIVE
Glucose, UA: NEGATIVE mg/dL
Hgb urine dipstick: NEGATIVE
Ketones, ur: NEGATIVE mg/dL
Leukocytes,Ua: NEGATIVE
Nitrite: NEGATIVE
Protein, ur: NEGATIVE mg/dL
Specific Gravity, Urine: 1.044 — ABNORMAL HIGH (ref 1.005–1.030)
pH: 5 (ref 5.0–8.0)

## 2021-06-05 LAB — BASIC METABOLIC PANEL
Anion gap: 8 (ref 5–15)
BUN: 22 mg/dL (ref 8–23)
CO2: 31 mmol/L (ref 22–32)
Calcium: 9.3 mg/dL (ref 8.9–10.3)
Chloride: 95 mmol/L — ABNORMAL LOW (ref 98–111)
Creatinine, Ser: 0.86 mg/dL (ref 0.61–1.24)
GFR, Estimated: >60 mL/min (ref >60)
Glucose, Bld: 95 mg/dL (ref 70–99)
Potassium: 3.6 mmol/L (ref 3.5–5.1)
Sodium: 134 mmol/L — ABNORMAL LOW (ref 135–145)

## 2021-06-05 LAB — TROPONIN I (HIGH SENSITIVITY): Troponin I (High Sensitivity): 6 ng/L (ref <18)

## 2021-06-05 LAB — BRAIN NATRIURETIC PEPTIDE: B Natriuretic Peptide: 343.9 pg/mL — ABNORMAL HIGH (ref 0.0–100.0)

## 2021-06-05 LAB — HEMOGLOBIN A1C
Hgb A1c MFr Bld: 7 % — ABNORMAL HIGH (ref 4.8–5.6)
Mean Plasma Glucose: 154.2 mg/dL

## 2021-06-05 LAB — CBG MONITORING, ED
Glucose-Capillary: 88 mg/dL (ref 70–99)
Glucose-Capillary: 151 mg/dL — ABNORMAL HIGH (ref 70–99)

## 2021-06-05 LAB — D-DIMER, QUANTITATIVE: D-Dimer, Quant: 0.86 ug/mL-FEU — ABNORMAL HIGH (ref 0.00–0.50)

## 2021-06-05 MED ORDER — LOSARTAN POTASSIUM 50 MG PO TABS
100.0000 mg | ORAL_TABLET | Freq: Every day | ORAL | Status: DC
  Administered 2021-06-05 – 2021-06-06 (×2): 100 mg via ORAL
  Filled 2021-06-05 (×2): qty 2

## 2021-06-05 MED ORDER — INSULIN ASPART 100 UNIT/ML IJ SOLN
0.0000 [IU] | Freq: Three times a day (TID) | INTRAMUSCULAR | Status: DC
  Administered 2021-06-05: 2 [IU] via SUBCUTANEOUS
  Administered 2021-06-05: 1 [IU] via SUBCUTANEOUS
  Administered 2021-06-06: 3 [IU] via SUBCUTANEOUS
  Administered 2021-06-06: 2 [IU] via SUBCUTANEOUS
  Administered 2021-06-07: 1 [IU] via SUBCUTANEOUS

## 2021-06-05 MED ORDER — ALBUTEROL SULFATE (2.5 MG/3ML) 0.083% IN NEBU: 2.5000 mg | INHALATION_SOLUTION | Freq: Four times a day (QID) | RESPIRATORY_TRACT | Status: DC | PRN

## 2021-06-05 MED ORDER — ASPIRIN 81 MG PO CHEW
81.0000 mg | CHEWABLE_TABLET | Freq: Every day | ORAL | Status: DC
  Administered 2021-06-05 – 2021-06-07 (×3): 81 mg via ORAL
  Filled 2021-06-05 (×3): qty 1

## 2021-06-05 MED ORDER — GABAPENTIN 100 MG PO CAPS
100.0000 mg | ORAL_CAPSULE | Freq: Two times a day (BID) | ORAL | Status: DC
  Administered 2021-06-05 – 2021-06-07 (×5): 100 mg via ORAL
  Filled 2021-06-05 (×5): qty 1

## 2021-06-05 MED ORDER — ENOXAPARIN SODIUM 40 MG/0.4ML IJ SOSY
40.0000 mg | PREFILLED_SYRINGE | INTRAMUSCULAR | Status: DC
  Administered 2021-06-05 – 2021-06-06 (×2): 40 mg via SUBCUTANEOUS
  Filled 2021-06-05 (×2): qty 0.4

## 2021-06-05 MED ORDER — LEVOTHYROXINE SODIUM 100 MCG PO TABS
100.0000 ug | ORAL_TABLET | Freq: Every day | ORAL | Status: DC
  Administered 2021-06-05 – 2021-06-07 (×3): 100 ug via ORAL
  Filled 2021-06-05 (×3): qty 1

## 2021-06-05 MED ORDER — FUROSEMIDE 10 MG/ML IJ SOLN
40.0000 mg | Freq: Every day | INTRAMUSCULAR | Status: DC
  Administered 2021-06-05 – 2021-06-06 (×2): 40 mg via INTRAVENOUS
  Filled 2021-06-05 (×2): qty 4

## 2021-06-05 MED ORDER — FUROSEMIDE 10 MG/ML IJ SOLN
40.0000 mg | Freq: Once | INTRAMUSCULAR | Status: AC
  Administered 2021-06-05: 40 mg via INTRAVENOUS
  Filled 2021-06-05: qty 4

## 2021-06-05 MED ORDER — SODIUM CHLORIDE 0.9% FLUSH
3.0000 mL | Freq: Two times a day (BID) | INTRAVENOUS | Status: DC
  Administered 2021-06-05 – 2021-06-07 (×5): 3 mL via INTRAVENOUS

## 2021-06-05 MED ORDER — MIRABEGRON ER 25 MG PO TB24
25.0000 mg | ORAL_TABLET | Freq: Every day | ORAL | Status: DC | PRN
  Filled 2021-06-05: qty 1

## 2021-06-05 MED ORDER — MECLIZINE HCL 25 MG PO TABS: 25.0000 mg | ORAL_TABLET | Freq: Three times a day (TID) | ORAL | Status: DC | PRN

## 2021-06-05 MED ORDER — VIBEGRON 75 MG PO TABS: 1.0000 | ORAL_TABLET | Freq: Every day | ORAL | Status: DC | PRN

## 2021-06-05 MED ORDER — ALUM & MAG HYDROXIDE-SIMETH 200-200-20 MG/5ML PO SUSP
30.0000 mL | Freq: Four times a day (QID) | ORAL | Status: DC | PRN
  Administered 2021-06-05 – 2021-06-06 (×2): 30 mL via ORAL
  Filled 2021-06-05 (×2): qty 30

## 2021-06-05 MED ORDER — ACETAMINOPHEN 650 MG RE SUPP: 650.0000 mg | Freq: Four times a day (QID) | RECTAL | Status: DC | PRN

## 2021-06-05 MED ORDER — ATORVASTATIN CALCIUM 80 MG PO TABS
80.0000 mg | ORAL_TABLET | Freq: Every day | ORAL | Status: DC
  Administered 2021-06-05 – 2021-06-07 (×3): 80 mg via ORAL
  Filled 2021-06-05 (×2): qty 1
  Filled 2021-06-05: qty 2

## 2021-06-05 MED ORDER — AMLODIPINE BESYLATE 5 MG PO TABS
7.5000 mg | ORAL_TABLET | Freq: Every day | ORAL | Status: DC
  Administered 2021-06-05 – 2021-06-06 (×2): 7.5 mg via ORAL
  Filled 2021-06-05 (×2): qty 1

## 2021-06-05 MED ORDER — ONDANSETRON HCL 4 MG PO TABS
4.0000 mg | ORAL_TABLET | Freq: Four times a day (QID) | ORAL | Status: DC | PRN
  Administered 2021-06-06: 4 mg via ORAL
  Filled 2021-06-05: qty 1

## 2021-06-05 MED ORDER — ACETAMINOPHEN 325 MG PO TABS: 650.0000 mg | ORAL_TABLET | Freq: Four times a day (QID) | ORAL | Status: DC | PRN

## 2021-06-05 MED ORDER — FAMOTIDINE 20 MG PO TABS
40.0000 mg | ORAL_TABLET | Freq: Every day | ORAL | Status: DC
  Administered 2021-06-05 – 2021-06-07 (×3): 40 mg via ORAL
  Filled 2021-06-05 (×3): qty 2

## 2021-06-05 MED ORDER — FINASTERIDE 5 MG PO TABS
5.0000 mg | ORAL_TABLET | Freq: Every day | ORAL | Status: DC
  Administered 2021-06-05 – 2021-06-07 (×3): 5 mg via ORAL
  Filled 2021-06-05 (×3): qty 1

## 2021-06-05 MED ORDER — ONDANSETRON HCL 4 MG/2ML IJ SOLN
4.0000 mg | Freq: Four times a day (QID) | INTRAMUSCULAR | Status: DC | PRN
  Administered 2021-06-05: 4 mg via INTRAVENOUS
  Filled 2021-06-05: qty 2

## 2021-06-05 MED ORDER — INSULIN ASPART 100 UNIT/ML IJ SOLN: 0.0000 [IU] | Freq: Every day | INTRAMUSCULAR | Status: DC

## 2021-06-05 MED ORDER — RANOLAZINE ER 500 MG PO TB12
500.0000 mg | ORAL_TABLET | Freq: Two times a day (BID) | ORAL | Status: DC
  Administered 2021-06-05 – 2021-06-07 (×5): 500 mg via ORAL
  Filled 2021-06-05 (×7): qty 1

## 2021-06-05 NOTE — H&P (Signed)
History and Physical    Alexander Choi VQQ:[id number] DOB: 1932/08/09 DOA: 06/04/2021  Referring MD/NP/PA: Beckie Salts, MD PCP: Nicoletta Dress, MD  Patient coming from: Home (lives alone) Via EMS  Chief Complaint: Fall  I have personally briefly reviewed patient's old medical records in [place]   HPI: CRU KRITIKOS is a 87 y.o. male with medical history significant of hypertension, hyperlipidemia, CAD s/p CABG in 2006, diabetes mellitus type 2, nephrolithiasis, and prostate cancer who presents after having a fall yesterday with complaints of left leg weakness.  Patient notes that he has been dealing with left upper and lower extremity off and on over the last 2 years.  However over the last 2 to 3 days symptoms have been more severe.  Also notes that he has been having pains running down the left leg from the knee down.  Yesterday, while trying to get out of his recliner he fell and related it to his left leg giving out.  He was unable to get back up and reported associated symptoms of feeling dizzy and has had some intermittent left-sided chest pain.  He lives alone and normally gets around with use of a cane.  Luckily his son came over to check on him and called 911 after finding him on the floor.  He had just recently had a heart cath 10/14 which noted severe three-vessel obstructive CAD, but no PCI targets available and recommended medical management.  ED Course: Patient presented as a code stroke and had been evaluated by neurology.  CT scan of the brain did not reveal any acute abnormality.  Patient was out of the window for tPA, and no large vessel occlusion was noted on the CT angiogram of the head and neck.  Labs from yesterday noted hemoglobin stable at 10.8, sodium 133, chloride 97, TSH 0.585, ammonia 23, high-sensitivity troponin negative x2 and INR 1.  During his work-up patient was noted to have respirations elevated up to 24, blood pressures maintained, and had a  drop in O2 saturations down to 89% on room air for which he was placed on 2 L of nasal cannula oxygen with improvement to 98%.  Chest x-ray noted cardiomegaly with mild pulmonary vascular congestion and atelectasis.  COVID-19 and influenza screening were negative.  Urinalysis showed no signs of infection, but was concentrated with specific gravity of 1.044.  Patient had been given Lasix 40 mg IV x1 dose.  Neurology recommended MRI of the brain to determine need of further work-up.  MRI of the brain did not show signs of a acute stroke.  TRH called to admit.  Review of Systems  Constitutional:  Negative for fever and malaise/fatigue.  HENT:  Negative for nosebleeds.   Eyes:  Negative for photophobia and pain.  Respiratory:  Negative for cough and shortness of breath.   Cardiovascular:  Positive for chest pain and claudication. Negative for leg swelling.  Gastrointestinal:  Negative for abdominal pain and vomiting.  Genitourinary:  Negative for dysuria and frequency.  Musculoskeletal:  Positive for falls, joint pain and myalgias.  Skin:  Negative for rash.  Neurological:  Positive for dizziness and focal weakness. Negative for loss of consciousness.  Psychiatric/Behavioral:  Negative for memory loss and substance abuse.    Past Medical History:  Diagnosis Date   [facility]    Chronic kidney disease    kidney stones   Coronary artery disease    with CABG in 2006 with LIMA to LAD, SVG to  mid and distal OM, SVG to AM. Normal Myoview in May of 2013   Diabetes mellitus    Type 2   GERD (gastroesophageal reflux disease)    Hematuria    Hypercholesterolemia    Hypertension    Irritable bowel syndrome    Loose stools    Prostate cancer ([facility])    Urinary obstruction     Past Surgical History:  Procedure Laterality Date   CHOLECYSTECTOMY     CORONARY ARTERY BYPASS GRAFT  2006   LEFT HEART CATH AND CORS/GRAFTS ANGIOGRAPHY N/A 04/09/2021   Procedure: LEFT HEART CATH AND CORS/GRAFTS  ANGIOGRAPHY;  Surgeon: [name], Peter M, MD;  Location: [facility];  Service: Cardiovascular;  Laterality: N/A;   LEFT HEART CATHETERIZATION WITH CORONARY/GRAFT ANGIOGRAM N/A 04/11/2014   Procedure: LEFT HEART CATHETERIZATION WITH [name];  Surgeon: Peter M [name], MD;  Location: [facility];  Service: Cardiovascular;  Laterality: N/A;   OTHER SURGICAL HISTORY     bowel obstruction   RADIOACTIVE SEED IMPLANT     for prostate cancer   RIGHT/LEFT HEART CATH AND CORONARY/GRAFT ANGIOGRAPHY N/A 12/12/2019   Procedure: RIGHT/LEFT HEART CATH AND CORONARY/GRAFT ANGIOGRAPHY;  Surgeon: [name], Peter M, MD;  Location: [facility];  Service: Cardiovascular;  Laterality: N/A;   SKIN CANCER EXCISION     TEMPORARY PACEMAKER N/A 12/13/2019   Procedure: TEMPORARY PACEMAKER;  Surgeon: [name], Peter M, MD;  Location: [facility];  Service: Cardiovascular;  Laterality: N/A;     reports that he has quit smoking. He smoked an average of 3 packs per day. He has never used smokeless tobacco. He reports that he does not drink alcohol and does not use drugs.  Allergies  Allergen Reactions   Ace Inhibitors Other (See Comments)    unknown   Nitrates, Organic Swelling    Swelling in hands   Shellfish Allergy Nausea Only    Pt states scallops-not all shellfish-GI symptoms/ nausea -50+ years ago   Penicillins Rash    Family History  Problem Relation Age of Onset   Cancer Mother        Leukemia   Hypertension Mother    Cancer Father        Colon   Heart attack Father        Had Several in his 71's   Hypertension Father    Diabetes Father     Prior to Admission medications   Medication Sig Start Date End Date Taking? Authorizing Provider  acetaminophen (TYLENOL) 500 MG tablet Take 1,000 mg by mouth every 6 (six) hours as needed for moderate pain or mild pain.   Yes [provider]  albuterol (VENTOLIN HFA) 108 (90 Base) MCG/ACT inhaler Inhale 2 puffs into the  lungs every 6 (six) hours as needed for wheezing or shortness of breath. 10/01/20  Yes [name], Peter M, MD  amLODipine (NORVASC) 5 MG tablet Take 1.5 tablets (7.5 mg total) by mouth at bedtime. 04/11/21  Yes Duke, Tami Lin, PA  aspirin 81 MG tablet Take 81 mg by mouth daily.   Yes [provider]  atorvastatin (LIPITOR) 80 MG tablet Take 1 tablet (80 mg total) by mouth daily. 12/16/19  Yes Duke, Tami Lin, PA  famotidine (PEPCID) 40 MG tablet Take 40 mg by mouth daily. 03/22/21  Yes [provider]  finasteride (PROSCAR) 5 MG tablet Take 5 mg by mouth daily. 05/18/20  Yes [provider]  furosemide (LASIX) 40 MG tablet Take 1 tablet (  40 mg total) by mouth daily. 10/01/20  Yes [name], Peter M, MD  gabapentin (NEURONTIN) 100 MG capsule Take 100 mg by mouth 2 (two) times daily. 10/01/20  Yes [name], Peter M, MD  GEMTESA 75 MG TABS Take 1 tablet by mouth daily as needed. 04/20/21  Yes [provider]  glimepiride (AMARYL) 4 MG tablet Take 4 mg by mouth daily with breakfast. 10/01/20  Yes [name], Peter M, MD  Ketotifen Fumarate (ITCHY EYE DROPS OP) Apply 2 drops to eye daily as needed (itchyness).   Yes [provider]  levothyroxine (SYNTHROID) 100 MCG tablet Take 1 tablet (100 mcg total) by mouth daily before breakfast. 10/01/20  Yes [name], Peter M, MD  losartan (COZAAR) 100 MG tablet Take 100 mg by mouth daily. 05/31/18  Yes [provider]  meclizine (ANTIVERT) 25 MG tablet Take 1 tablet (25 mg total) by mouth 3 (three) times daily as needed for dizziness. 10/01/20  Yes [name], Peter M, MD  metFORMIN (GLUCOPHAGE) 500 MG tablet Take 1 tablet (500 mg total) by mouth 2 (two) times daily with a meal. Resume on 04/13/21. 04/11/21  Yes Duke, Tami Lin, PA  Multiple Vitamins-Minerals (ICAPS AREDS 2 PO) Take 1 Cartridge by mouth in the morning and at bedtime.   Yes [provider]  ondansetron (ZOFRAN) 4 MG tablet Take 1 tablet (4 mg total) by  mouth every 8 (eight) hours as needed for nausea or vomiting. 10/01/20  Yes [name], Peter M, MD  ranolazine (RANEXA) 500 MG 12 hr tablet Take 1 tablet (500 mg total) by mouth 2 (two) times daily. 04/11/21  Yes Duke, Tami Lin, PA  vitamin B-12 (CYANOCOBALAMIN) 1000 MCG tablet Take 1 tablet (1,000 mcg total) by mouth daily. 10/01/20  Yes [name], Peter M, MD  Accu-Chek Softclix Lancets lancets  07/04/19   [provider]  Accu-Chek Softclix Lancets lancets  12/23/18   [provider]  glucose blood (ACCU-CHEK AVIVA PLUS) test strip  07/03/17   [provider]  nitroGLYCERIN (NITROSTAT) 0.4 MG SL tablet Place 1 tablet (0.4 mg total) under the tongue every 5 (five) minutes as needed for chest pain. Patient not taking: Reported on 06/04/2021 06/05/15   [name], Peter M, MD  NONFORMULARY OR COMPOUNDED [place]:  Peripheral Neuropathy Cream - Bupivacaine 1%, doxepin 3%, Gabapentin 6%, Pentoxifylline 3%, Topiramate 1%, apply 1-2 grams to affected area 3-4 times daily. Patient not taking: Reported on 06/04/2021 02/25/16   Landis Martins, DPM  pantoprazole (PROTONIX) 40 MG tablet Take 1 tablet (40 mg total) by mouth daily. Patient not taking: Reported on 06/04/2021 09/17/19 04/09/21  Noemi Chapel, MD  Wheat Dextrin (BENEFIBER PO) Take 1 tablet by mouth daily. Patient not taking: Reported on 06/04/2021    [provider]    Physical Exam:  Constitutional: Elderly male currently in no acute distress Vitals:   06/05/21 0530 06/05/21 0545 06/05/21 0600 06/05/21 0615  BP: (!) 137/57 (!) 136/53 (!) 122/52 125/89  Pulse: 66 68 62 63  Resp: 15 15 13 11   Temp:      TempSrc:      SpO2: 97% 97% 97% 98%  Weight:      Height:       Eyes: PERRL, lids and conjunctivae normal ENMT: Mucous membranes are moist. Posterior pharynx clear of any exudate or lesions.  Hard of hearing. Neck: normal, supple, no masses, no thyromegaly Respiratory: clear to auscultation bilaterally,  no wheezing, no crackles. Normal respiratory effort. No accessory muscle use.  Cardiovascular: Regular rate and rhythm, no murmurs / rubs / gallops. No extremity edema.  Hard to appreciate DP and DP pulses of the bilateral lower extremity no carotid bruits.  Abdomen: no tenderness, no masses palpated. No hepatosplenomegaly. Bowel sounds positive.  Musculoskeletal: no  cyanosis.  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 Skin: no rashes, lesions, ulcers. No induration Neurologic: CN 2-12 grossly intact.  Strength 4/5 in left leg, 4+/5 in the left arm, and 5/5 on the right side Psychiatric: Normal judgment and insight. Alert and oriented x 3. Normal mood.     Labs on Admission: I have personally reviewed following labs and imaging studies  CBC: Recent Labs  Lab 06/04/21 1509 06/04/21 1511  WBC 8.7  --   NEUTROABS 5.1  --   HGB 10.8* 10.9*  HCT 32.7* 32.0*  MCV 98.8  --   PLT 198  --    Basic Metabolic Panel: Recent Labs  Lab 06/04/21 1509 06/04/21 1511  NA 133* 136  K 4.1 4.2  CL 97* 97*  CO2 28  --   GLUCOSE 90 86  BUN 20 23  CREATININE 0.86 0.80  CALCIUM 9.7  --    GFR: Estimated Creatinine Clearance: 68.4 mL/min (by C-G formula based on SCr of 0.8 mg/dL). Liver Function Tests: Recent Labs  Lab 06/04/21 1509  AST 21  ALT 19  ALKPHOS 67  BILITOT 0.9  PROT 6.6  ALBUMIN 3.7   No results for input(s): LIPASE, AMYLASE in the last 168 hours. Recent Labs  Lab 06/04/21 2110  AMMONIA 23   Coagulation Profile: Recent Labs  Lab 06/04/21 1509  INR 1.0   Cardiac Enzymes: No results for input(s): CKTOTAL, CKMB, CKMBINDEX, TROPONINI in the last 168 hours. BNP (last 3 results) No results for input(s): PROBNP in the last 8760 hours. HbA1C: No results for input(s): HGBA1C in the last 72 hours. CBG: Recent Labs  Lab 06/04/21 1507  GLUCAP 83   Lipid Profile: No results for input(s): CHOL, HDL, LDLCALC, TRIG, CHOLHDL, LDLDIRECT in the last 72  hours. Thyroid Function Tests: Recent Labs    06/04/21 2110  TSH 0.585   Anemia Panel: No results for input(s): VITAMINB12, FOLATE, FERRITIN, TIBC, IRON, RETICCTPCT in the last 72 hours. Urine analysis:    Component Value Date/Time   COLORURINE AMBER (A) 06/05/2021 0220   APPEARANCEUR CLEAR 06/05/2021 0220   LABSPEC 1.044 (H) 06/05/2021 0220   PHURINE 5.0 06/05/2021 0220   GLUCOSEU NEGATIVE 06/05/2021 0220   HGBUR NEGATIVE 06/05/2021 0220   BILIRUBINUR NEGATIVE 06/05/2021 0220   KETONESUR NEGATIVE 06/05/2021 0220   PROTEINUR NEGATIVE 06/05/2021 0220   NITRITE NEGATIVE 06/05/2021 0220   LEUKOCYTESUR NEGATIVE 06/05/2021 0220   Sepsis Labs: Recent Results (from the past 240 hour(s))  Resp Panel by RT-PCR (Flu A&B, Covid) Nasopharyngeal Swab     Status: None   Collection Time: 06/04/21  9:04 PM   Specimen: Nasopharyngeal Swab; Nasopharyngeal(NP) swabs in vial transport medium  Result Value Ref Range Status   SARS Coronavirus 2 by RT PCR NEGATIVE NEGATIVE Final    Comment: (NOTE) SARS-CoV-2 target nucleic acids are NOT DETECTED.  The SARS-CoV-2 RNA is generally detectable in upper respiratory specimens during the acute phase of infection. The lowest concentration of SARS-CoV-2 viral copies this assay can detect is 138 copies/mL. A negative result does not preclude SARS-Cov-2 infection and should not be used as the sole basis for treatment or other patient management decisions. A negative result may occur with  improper specimen collection/handling, submission of specimen other than nasopharyngeal swab, presence of viral mutation(s) within the areas targeted by this assay, and inadequate number of viral copies(<138 copies/mL). A negative result must be combined with clinical observations, patient history, and epidemiological information. The expected result is Negative.  Fact Sheet for Patients:  EntrepreneurPulse.com.au  Fact Sheet for Healthcare  Providers:  IncredibleEmployment.be  This test is no t yet approved or cleared by the [place] FDA and  has been authorized for detection and/or diagnosis of SARS-CoV-2 by FDA under an Emergency Use Authorization (EUA). This EUA will remain  in effect (meaning this test can be used) for the duration of the COVID-19 declaration under Section 564(b)(1) of the Act, 21 U.S.C.section 360bbb-3(b)(1), unless the authorization is terminated  or revoked sooner.       Influenza A by PCR NEGATIVE NEGATIVE Final   Influenza B by PCR NEGATIVE NEGATIVE Final    Comment: (NOTE) The Xpert Xpress SARS-CoV-2/FLU/RSV plus assay is intended as an aid in the diagnosis of influenza from Nasopharyngeal swab specimens and should not be used as a sole basis for treatment. Nasal washings and aspirates are unacceptable for Xpert Xpress SARS-CoV-2/FLU/RSV testing.  Fact Sheet for Patients: EntrepreneurPulse.com.au  Fact Sheet for Healthcare Providers: IncredibleEmployment.be  This test is not yet approved or cleared by the [place] FDA and has been authorized for detection and/or diagnosis of SARS-CoV-2 by FDA under an Emergency Use Authorization (EUA). This EUA will remain in effect (meaning this test can be used) for the duration of the COVID-19 declaration under Section 564(b)(1) of the Act, 21 U.S.C. section 360bbb-3(b)(1), unless the authorization is terminated or revoked.  Performed at [facility], [place] [address] [place], [place] [address]      Radiological Exams on Admission: MR BRAIN WO CONTRAST  Result Date: 06/04/2021 CLINICAL DATA:  Left leg weakness for several days, concern for stroke EXAM: MRI HEAD WITHOUT CONTRAST TECHNIQUE: Multiplanar, multiecho pulse sequences of the brain and surrounding structures were obtained without intravenous contrast. COMPARISON:  04/27/2020 FINDINGS: Brain: No restricted diffusion to  suggest acute or subacute infarction. No acute hemorrhage, hydrocephalus, extra-axial collection, or mass lesion. The ventricles and sulci are within normal limits for age. T2 hyperintense signal in the periventricular white matter, likely the sequela of chronic small vessel ischemic disease. Vascular: Normal flow voids. Skull and upper cervical spine: Normal marrow signal. Sinuses/Orbits: Mucosal thickening in the right maxillary sinus. Status post bilateral lens replacements. Other: Trace fluid in the right-greater-than-left mastoid air cells. IMPRESSION: No acute intracranial process. No evidence of acute or subacute infarction. Electronically Signed   By: Merilyn Baba M.D.   On: 06/04/2021 19:48   DG Chest Portable 1 View  Result Date: 06/04/2021 CLINICAL DATA:  Cough, fatigue, chest pain. EXAM: PORTABLE CHEST 1 VIEW COMPARISON:  04/09/2021. FINDINGS: The heart is enlarged and there is evidence of prior cardiothoracic surgery. Atherosclerotic calcification of the aorta is noted. The pulmonary vasculature is mildly distended. Lung volumes are low and there is chronic elevation the left diaphragm. Mild atelectasis is present at the left lung base. No effusion or pneumothorax is seen. No acute osseous abnormality. IMPRESSION: 1. Cardiomegaly with mild pulmonary vascular congestion. 2. Low lung volumes with chronic elevation the left diaphragm and atelectasis at the left lung base. Electronically Signed   By: Brett Fairy M.D.   On: 06/04/2021 21:23   CT HEAD CODE STROKE WO CONTRAST  Result Date: 06/04/2021 CLINICAL DATA:  Code stroke.  Left-sided weakness EXAM: CT HEAD WITHOUT CONTRAST TECHNIQUE: Contiguous axial images were obtained from the base of the skull through the vertex without intravenous contrast. COMPARISON:  04/26/2020 FINDINGS: Brain: No evidence of acute infarction, hemorrhage, cerebral edema, mass, mass effect, or midline shift. Ventricles and sulci are normal for age. No extra-axial fluid  collection. Periventricular white matter changes, likely the sequela of chronic small vessel ischemic disease. Mild cerebral atrophy. Vascular: No hyperdense vessel or unexpected calcification. Skull: Normal. Negative for fracture or focal lesion. Sinuses/Orbits: Mild mucosal thickening in the ethmoid air cells and right maxillary sinus. Status post bilateral lens replacements. Other: The mastoid air cells are well aerated. ASPECTS [facility] Stroke Program Early CT Score) - Ganglionic level infarction (caudate, lentiform nuclei, internal capsule, insula, M1-M3 cortex): 7 - Supraganglionic infarction (M4-M6 cortex): 3 Total score (0-10 with 10 being normal): 10 IMPRESSION: 1. No acute intracranial process. 2. ASPECTS is 10 Code stroke imaging results were communicated on 06/04/2021 at 3:22 pm to provider Dr. Lorrin Goodell via secure text paging. Electronically Signed   By: Merilyn Baba M.D.   On: 06/04/2021 15:22   CT ANGIO HEAD NECK W WO CM (CODE STROKE)  Result Date: 06/04/2021 CLINICAL DATA:  Neuro deficit, acute, stroke suspected. EXAM: CT ANGIOGRAPHY HEAD AND NECK TECHNIQUE: Multidetector CT imaging of the head and neck was performed using the standard protocol during bolus administration of intravenous contrast. Multiplanar CT image reconstructions and MIPs were obtained to evaluate the vascular anatomy. Carotid stenosis measurements (when applicable) are obtained utilizing NASCET criteria, using the distal internal carotid diameter as the denominator. CONTRAST:  34mL OMNIPAQUE IOHEXOL 350 MG/ML SOLN COMPARISON:  Noncontrast head CT performed earlier today 06/04/2021. MRI brain and MRA head 04/27/2020. FINDINGS: CTA NECK FINDINGS Aortic arch: The left vertebral artery arises directly from the aortic arch. Atherosclerotic plaque within the visualized aortic arch and proximal major branch vessels of the neck. No hemodynamically significant innominate or proximal subclavian artery stenosis. Right carotid system:  CCA and ICA patent within the neck without significant stenosis (50% or greater). Moderate predominantly calcified plaque within the distal CCA, carotid bifurcation and proximal ICA. Left carotid system: CCA and ICA patent within the neck without significant stenosis (50% or greater). Calcified plaque within the left carotid system. Most notably, moderate calcified plaque is present within the distal CCA, carotid bifurcation and proximal ICA Vertebral arteries: Vertebral arteries patent within the neck. Calcified plaque results in apparent severe stenosis at the origin of the right vertebral artery. No significant stenosis within the left cervical vertebral artery. Skeleton: Cervical dextrocurvature with partially imaged thoracic levocurvature. Cervical spondylosis. No acute bony abnormality or aggressive osseous lesion. Other neck: No neck mass or cervical lymphadenopathy. Upper chest: No consolidation within the imaged lung apices. Review of the MIP images confirms the above findings CTA HEAD FINDINGS Anterior circulation: The intracranial internal carotid arteries are patent. Plaque within both vessels. Mild stenosis within the paraclinoid right ICA. The M1 middle cerebral arteries are patent. No M2 proximal branch occlusion or high-grade proximal stenosis is identified. The anterior cerebral arteries are patent. The A1 right ACA is hypoplastic or absent. No intracranial aneurysm is identified. Posterior circulation: The intracranial vertebral arteries are patent. Plaque within the V4 vertebral arteries, bilaterally, with no more than mild stenosis. Flow is present within the proximal right PICA. The left PICA is poorly delineated. The basilar artery is patent. The posterior cerebral arteries are patent. Posterior communicating arteries are diminutive or absent bilaterally. Venous sinuses: Within the limitations of  contrast timing, no convincing thrombus. Anatomic variants: As described. Review of the MIP images  confirms the above findings These results were called by telephone at the time of interpretation on 06/04/2021 at 4:07 pm to provider [facility] , who verbally acknowledged these results. IMPRESSION: CTA neck: 1. The common carotid and internal carotid arteries are patent within the neck without hemodynamically significant stenosis (50% or greater). Atherosclerotic plaque within the bilateral carotid systems within the neck, as described. 2. Vertebral arteries patent within the neck. Calcified plaque results in apparent severe stenosis at the origin of the right vertebral artery. CTA head: 1. The left PICA is poorly delineated. This may be due to the developmentally small vessel size, high-grade stenosis or vessel occlusion. 2. Elsewhere, no intracranial large vessel occlusion or proximal high-grade arterial stenosis is identified. 3. Calcified plaque within the intracranial internal carotid arteries. Mild stenosis of the paraclinoid right ICA. 4. Calcified plaque within the V4 vertebral arteries, bilaterally, with no more than mild stenosis. Electronically Signed   By: Kellie Simmering D.O.   On: 06/04/2021 16:09    EKG: Independently reviewed.  Accelerated junctional rhythm at 70 bpm  Assessment/Plan Fall secondary to left sided weakness: Patient presented as a code stroke after having a fall with complaints of 2-3 day history of left leg weakness.  Work-up including CT, CTA, and subsequent MRI showed no signs of acute stroke.  Unclear cause of intermittent left-sided weakness that patient states has been present for 2 years. -Admit to a telemetry bed -Up with assistance -Physical therapy to evaluate and treat -Transitions of care consulted for possible need of rehab therapies -Neurology consulted, will follow-up for any further recommendation  Acute respiratory failure with hypoxia secondary to diastolic CHF exacerbation: Patient was noted to have drop in O2 saturation down to 89% on room air for  which he was placed on 2 L of nasal cannula oxygen with improvement in O2 saturations.  Chest x-ray had noted cardiomegaly with mild pulmonary vascular congestion.  Last EF was noted to be 60-65% with grade 1 diastolic dysfunction [id number]/3974.  Patient had been given 40 mg of Lasix IV as concern for possible CHF exacerbation as cause of symptoms -Strict I&O's and daily weight -Check BNP -Continue Lasix 40 mg daily -Reassess diuresis and adjust as needed.  CAD: Patient status post CABG in 2006.  Just underwent left heart cath 04/09/2021 which noted severe three-vessel obstructive disease with patency of bypass grafts including LIMA to LAD, SVG to OM1, SVG to RV marginal vessel.  There were no reported targets for PCI and recommended for continued medical therapy.  Previous history of intolerance to higher Ranexa dose and therefore amlodipine dose was increased. -Continue aspirin, statin, and Ranexa  Essential hypertension: Blood pressures currently maintained.  Home blood pressure regimen includes amlodipine 7.5 mg nightly, furosemide 40 mg daily, and losartan 100 mg daily. -Continue amlodipine, losartan  Diabetes mellitus type 2 with peripheral: Last available hemoglobin A1c was 6.8 in 11/2019 Home medication regimen includes metformin 500 mg twice daily and Amaryl 2 mg twice daily. -Hypoglycemic protocols -Held metformin and Amaryl -CBGs before every meal with sensitive SS  PAD: ABIs from 09/2020 noted right ABI 0.75 TBI 0.39, and left ABI 0.7 5TBI 0.32.  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  He is followed by Dr. Gwenlyn Found in the outpatient setting.  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 Gwenlyn Found in the outpatient setting or formally consult if thought to be contributing to patient's  symptoms.  Hyperlipidemia: Home medication includes atorvastatin 80 mg daily. -Continue atorvastatin  Hypothyroidism: TSH 0.585.  Home medication regimen includes levothyroxine 100 mcg daily. -Continue levothyroxine  BPH -Continue finasteride  GERD -Continue Pepcid  DVT prophylaxis: Lovenox Code Status: Full Family Communication: Son updated at bedside Disposition Plan: To be determined Consults called: None Admission status: Observation   Norval Morton MD [facility]   If 7PM-7AM, please contact night-coverage   06/05/2021, 7:11 AM

## 2021-06-05 NOTE — Evaluation (Signed)
Physical Therapy Evaluation Patient Details Name: Alexander Choi MRN: [id number] DOB: 1933/02/08 Today's Date: 06/05/2021  History of Present Illness  Pt is an 87 y.o. male who presented 06/04/21 as a code stroke with L lower extremity weaknes, vertigo, and nausea. Pt also s/p fall from recliner. Outside window for TNKase. CT and MRI of head negative for acute stroke, suspecting metabolic encephalopathy. PMH significant for hypertension, DM2, CAD status post CABG, prior history of NSTEMI, right total ostial status post amputation   Clinical Impression  Pt presents with condition above and deficits mentioned below, see PT Problem List. PTA, he was mod I, intermittently using a 3-prong cane for mobility, reporting a hx of falls. Pt lives alone in a house with 5 STE with a handrail, but he has 2 sons nearby who can provide almost 24/7 assistance if needed (3 hours in which they are both at work at same time). Currently, pt displays some mild deficits in L upper and lower extremity strength and coordination compared to his R, but he reports it is much improved since yesterday. Pt also with a posterior bias in sitting and standing that impacts his balance. His balance is much improved with him only needing min guard for safety when he uses a RW though. Recommending pt receive a RW at d/c and have near constant assistance initially at d/c until his status further improves to ensure his safety with mobility. Recommending pt follow-up with Outpatient PT to maximize his safety and independence with all functional mobility. Will continue to follow acutely.     Recommendations for follow up therapy are one component of a multi-disciplinary discharge planning process, led by the attending physician.  Recommendations may be updated based on patient status, additional functional criteria and insurance authorization.  Follow Up Recommendations Outpatient PT    Assistance Recommended at Discharge Frequent or  constant Supervision/Assistance (initally but progress to intermittent)  Functional Status Assessment Patient has had a recent decline in their functional status and demonstrates the ability to make significant improvements in function in a reasonable and predictable amount of time.  Equipment Recommendations  Rolling walker (2 wheels)    Recommendations for Other Services OT consult     Precautions / Restrictions Precautions Precautions: Fall Precaution Comments: HOH; urinary incontinence Restrictions Weight Bearing Restrictions: No      Mobility  Bed Mobility Overal bed mobility: Modified Independent             General bed mobility comments: Pt able to transition supine <> sit in stretcher without issues.    Transfers Overall transfer level: Needs assistance Equipment used: Rolling walker (2 wheels);None Transfers: Sit to/from Stand Sit to Stand: Min assist;Min guard;From elevated surface           General transfer comment: Pt coming to stand 2x to no UE support with posterior bias, needing minA initially for stability. ModA for continued standing due to increased posterior lean. When coming to stand 2x to RW pt able to transfer safely with min guard.    Ambulation/Gait Ambulation/Gait assistance: Min guard Gait Distance (Feet): 180 Feet Assistive device: Rolling walker (2 wheels) Gait Pattern/deviations: Step-through pattern;Decreased stride length Gait velocity: reduced Gait velocity interpretation: 1.31 - 2.62 ft/sec, indicative of limited community ambulator   General Gait Details: Pt with fairly symmetrical bil gait pattern when using RW. Slightly slowed pace and decreased stride length, but no LOB, min guard for safety.  Stairs            Wheelchair  Mobility    Modified Rankin (Stroke Patients Only) Modified Rankin (Stroke Patients Only) Pre-Morbid Rankin Score: Slight disability Modified Rankin: Moderately severe disability     Balance  Overall balance assessment: Needs assistance Sitting-balance support: Bilateral upper extremity supported;No upper extremity supported;Feet supported Sitting balance-Leahy Scale: Fair Sitting balance - Comments: Pt needing min guard-supervision for safety initially, but intermittent posterior bias needing up to minA to prevent LOB cuing pt to lean anteriorly. Postural control: Posterior lean Standing balance support: Bilateral upper extremity supported Standing balance-Leahy Scale: Poor Standing balance comment: Pt with posterior bias when trying to stand without AD, reaching to hold onto bed, needing up to modA to prevent LOB. Min guard when standing with RW                             Pertinent Vitals/Pain Pain Assessment: Faces Faces Pain Scale: Hurts a little bit Pain Location: L chest Pain Descriptors / Indicators: Discomfort Pain Intervention(s): Monitored during session;Limited activity within patient's tolerance;Repositioned    Home Living Family/patient expects to be discharged to:: Private residence Living Arrangements: Alone Available Help at Discharge: Family;Available PRN/intermittently (x2 sons live nearby, one works 1st shift and other worsk second shift, so 3 hour overlay in which no one available) Type of Home: House Home Access: Stairs to enter Entrance Stairs-Rails: Right (ascending) Entrance Stairs-Number of Steps: 5 Alternate Level Stairs-Number of Steps: flight Home Layout: Two level;Able to live on main level with bedroom/bathroom (basement is the other level, does not need to go there often) Home Equipment: Rollator (4 wheels);Cane - quad;Shower seat - built in;Grab bars - toilet;Grab bars - tub/shower;Other (comment) (rising bed and chair; 3-prong cane)      Prior Function Prior Level of Function : Independent/Modified Independent;History of Falls (last six months);Driving             Mobility Comments: Intermittently uses 3-prong cane. ADLs  Comments: Sons normally cook supper, pt uses microwave majority of time but pt can fix breakfast     Hand Dominance   Dominant Hand: Left    Extremity/Trunk Assessment   Upper Extremity Assessment Upper Extremity Assessment: LUE deficits/detail LUE Deficits / Details: MMT scores of 4+ shoulder flexion, 5 elbow flexion, 4- elbow extension compared to R MMT scores of 5 shoulder flexion, 5 elbow flexion, 4- elbow extension; good grip on L, slightly more than R as he is L handed; decreased L shoulder AROM (~120 degrees) compared to R (~145 degrees); no new changes in sensation, hx of L 1st and 2nd digit numbness and L shoulder and carpal tunnel surgeries; slower on L with finger to nose <> finger tip and difficulty anlternating forearm pronation <> supination LUE Sensation: decreased light touch (1st and 2nd finger, but no new changes) LUE Coordination: decreased gross motor;decreased fine motor    Lower Extremity Assessment Lower Extremity Assessment: LLE deficits/detail LLE Deficits / Details: MMT scores of 4+ hip flexion, 5 knee extension, 5 ankle dorsiflexion compared to 5 throughout on R for same motions tested; Hx of peripheral neuropathy with no sensation in bil feet but able to detect touch starting aorund ankle, no acute changes in sensation; slower on L with heel to R shin rubbing LLE Sensation: history of peripheral neuropathy LLE Coordination: decreased gross motor    Cervical / Trunk Assessment Cervical / Trunk Assessment: Normal  Communication   Communication: HOH  Cognition Arousal/Alertness: Awake/alert Behavior During Therapy: WFL for tasks assessed/performed Overall Cognitive Status:  Within Functional Limits for tasks assessed                                          General Comments General comments (skin integrity, edema, etc.): Urinary incontinence continually with standing; VSS on RA and BP stable with transition supine > sit    Exercises      Assessment/Plan    PT Assessment Patient needs continued PT services  PT Problem List Decreased strength;Decreased activity tolerance;Decreased range of motion;Decreased balance;Decreased mobility;Decreased coordination;Impaired sensation       PT Treatment Interventions DME instruction;Gait training;Stair training;Functional mobility training;Therapeutic activities;Therapeutic exercise;Neuromuscular re-education;Balance training;Patient/family education    PT Goals (Current goals can be found in the Care Plan section)  Acute Rehab PT Goals Patient Stated Goal: to get better PT Goal Formulation: With patient Time For Goal Achievement: 06/19/21 Potential to Achieve Goals: Good    Frequency Min 3X/week   Barriers to discharge        Co-evaluation               AM-PAC PT "6 Clicks" Mobility  Outcome Measure Help needed turning from your back to your side while in a flat bed without using bedrails?: None Help needed moving from lying on your back to sitting on the side of a flat bed without using bedrails?: None Help needed moving to and from a bed to a chair (including a wheelchair)?: A Little Help needed standing up from a chair using your arms (e.g., wheelchair or bedside chair)?: A Little Help needed to walk in hospital room?: A Little Help needed climbing 3-5 steps with a railing? : A Little 6 Click Score: 20    End of Session Equipment Utilized During Treatment: Gait belt Activity Tolerance: Patient tolerated treatment well Patient left: in bed;with call bell/phone within reach Nurse Communication: Mobility status;Other (comment) (VSS on RA, urinary incontinence, CP) PT Visit Diagnosis: Unsteadiness on feet (R26.81);Other abnormalities of gait and mobility (R26.89);Muscle weakness (generalized) (M62.81);History of falling (Z91.81);Difficulty in walking, not elsewhere classified (R26.2)    Time: 0912-1000 PT Time Calculation (min) (ACUTE ONLY): 48  min   Charges:   PT Evaluation $PT Eval Moderate Complexity: 1 Mod PT Treatments $Gait Training: 8-22 mins $Therapeutic Activity: 8-22 mins        Moishe Spice, PT, DPT Acute Rehabilitation Services  Pager: [phone] Office: [phone]   Orvan Falconer 06/05/2021, 10:29 AM

## 2021-06-05 NOTE — Care Management CC44 (Signed)
Condition Code 44 Documentation Completed  Patient Details  Name: Alexander Choi MRN: [id number] Date of Birth: [birth year]   Condition Code 44 given:  Yes Patient signature on Condition Code 44 notice:  Yes Documentation of 2 MD's agreement:  Yes Code 44 added to claim:  Yes    Bethena Roys, RN 06/05/2021, 3:51 PM

## 2021-06-05 NOTE — ED Provider Notes (Signed)
Pt signed out by Dr. Sherry Ruffing awaiting UA.  Urine is normal.  Pt's O2 sat did drop to 89% and he was put on 2L.  CXR does show some pulmonary vascular congestion, so pt is given lasix 40 mg IV.  Pt d/w Dr. Marlowe Sax ([facility]) for admission.   Isla Pence, MD 06/05/21 [phone]

## 2021-06-05 NOTE — Evaluation (Signed)
Occupational Therapy Evaluation Patient Details Name: Alexander Choi MRN: [id number] DOB: 09-May-1933 Today's Date: 06/05/2021   History of Present Illness Pt is an 87 y.o. male who presented 06/04/21 as a code stroke with L lower extremity weaknes, vertigo, and nausea. Pt also s/p fall from recliner. Outside window for TNKase. CT and MRI of head negative for acute stroke, suspecting metabolic encephalopathy. PMH significant for hypertension, DM2, CAD status post CABG, prior history of NSTEMI, right total ostial status post amputation   Clinical Impression   Patient admitted for the diagnosis above.  PTA he lives alone with assist as needed from family on PRN basis.  Decreased safety with RW and poor balance are the primary deficits.  Patient needing a lot of cues for RW management.  OT to follow in the acute setting, hopefully family can increase assist in the short term, and PT is recommending outpatient PT.       Recommendations for follow up therapy are one component of a multi-disciplinary discharge planning process, led by the attending physician.  Recommendations may be updated based on patient status, additional functional criteria and insurance authorization.   Follow Up Recommendations  No OT follow up    Assistance Recommended at Discharge Set up Supervision/Assistance  Functional Status Assessment  Patient has had a recent decline in their functional status and demonstrates the ability to make significant improvements in function in a reasonable and predictable amount of time.  Equipment Recommendations  None recommended by OT    Recommendations for Other Services       Precautions / Restrictions Precautions Precautions: Fall Restrictions Weight Bearing Restrictions: No      Mobility Bed Mobility Overal bed mobility: Modified Independent                  Transfers Overall transfer level: Needs assistance Equipment used: Rolling walker (2  wheels);None Transfers: Sit to/from Stand Sit to Stand: Min assist                  Balance Overall balance assessment: Needs assistance Sitting-balance support: Feet supported Sitting balance-Leahy Scale: Good     Standing balance support: Bilateral upper extremity supported Standing balance-Leahy Scale: Poor                             ADL either performed or assessed with clinical judgement   ADL       Grooming: Wash/dry hands;Supervision/safety;Standing       Lower Body Bathing: Min guard;Sit to/from stand       Lower Body Dressing: Min guard;Sit to/from stand   Toilet Transfer: Min guard;Rolling walker (2 wheels);Ambulation             General ADL Comments: decreased safety with RW     Vision Patient Visual Report: No change from baseline       Perception Perception Perception: Not tested   Praxis Praxis Praxis: Not tested    Pertinent Vitals/Pain Faces Pain Scale: Hurts a little bit Pain Location: L knee Pain Descriptors / Indicators: Discomfort Pain Intervention(s): Monitored during session     Hand Dominance Left   Extremity/Trunk Assessment Upper Extremity Assessment Upper Extremity Assessment: Overall WFL for tasks assessed LUE Sensation: decreased light touch LUE Coordination: decreased gross motor   Lower Extremity Assessment Lower Extremity Assessment: Defer to PT evaluation   Cervical / Trunk Assessment Cervical / Trunk Assessment: Normal   Communication Communication Communication: HOH   Cognition  Arousal/Alertness: Awake/alert Behavior During Therapy: Impulsive Overall Cognitive Status: Within Functional Limits for tasks assessed                                       General Comments   VSS    Exercises     Shoulder Instructions      Home Living Family/patient expects to be discharged to:: Private residence Living Arrangements: Alone Available Help at Discharge: Family;Available  PRN/intermittently Type of Home: House Home Access: Stairs to enter [employer] of Steps: 5 Entrance Stairs-Rails: Right Home Layout: Two level;Able to live on main level with bedroom/bathroom Alternate Level Stairs-Number of Steps: flight Alternate Level Stairs-Rails: Left;Right Bathroom Shower/Tub: Occupational psychologist: Handicapped height     Home Equipment: Rollator (4 wheels);Cane - quad;Shower seat - built in;Grab bars - toilet;Grab bars - tub/shower;Other (comment)          Prior Functioning/Environment Prior Level of Function : Independent/Modified Independent;History of Falls (last six months);Driving             Mobility Comments: Intermittently uses 3-prong cane. ADLs Comments: Sons normally cook supper, pt uses microwave majority of time but pt can fix breakfast        OT Problem List: Decreased activity tolerance;Impaired balance (sitting and/or standing);Decreased safety awareness      OT Treatment/Interventions: Self-care/ADL training;Therapeutic activities;Balance training;Patient/family education    OT Goals(Current goals can be found in the care plan section) Acute Rehab OT Goals Patient Stated Goal: I'm going back to rehab, my balance isn't great OT Goal Formulation: With patient Time For Goal Achievement: 06/19/21 Potential to Achieve Goals: Good ADL Goals Pt Will Perform Grooming: with modified independence;standing Pt Will Perform Lower Body Bathing: with modified independence;sit to/from stand Pt Will Perform Lower Body Dressing: with modified independence;sit to/from stand Pt Will Transfer to Toilet: with modified independence;regular height toilet;ambulating  OT Frequency: Min 2X/week   Barriers to D/C: Decreased caregiver support          Co-evaluation              AM-PAC OT "6 Clicks" Daily Activity     Outcome Measure Help from another person eating meals?: None Help from another person taking care of  personal grooming?: None Help from another person toileting, which includes using toliet, bedpan, or urinal?: A Little Help from another person bathing (including washing, rinsing, drying)?: A Little Help from another person to put on and taking off regular upper body clothing?: None Help from another person to put on and taking off regular lower body clothing?: A Little 6 Click Score: 21   End of Session Equipment Utilized During Treatment: Gait belt;Rolling walker (2 wheels)  Activity Tolerance: Patient tolerated treatment well Patient left: in chair;with call bell/phone within reach;with chair alarm set;with nursing/sitter in room  OT Visit Diagnosis: Unsteadiness on feet (R26.81);Pain Pain - Right/Left: Left Pain - part of body: Leg                Time: 1655-1716 OT Time Calculation (min): 21 min Charges:  OT General Charges $OT Visit: 1 Visit OT Evaluation $OT Eval Moderate Complexity: 1 Mod  06/05/2021  RP, OTR/L  Acute Rehabilitation Services  Office:  [phone]   Metta Clines 06/05/2021, 5:21 PM

## 2021-06-05 NOTE — Progress Notes (Signed)
NEUROLOGY CONSULTATION PROGRESS NOTE   Date of service: June 05, 2021 Patient Name: Alexander Choi MRN:  [id number] DOB:  09-06-1932   Interval Hx   Encephalopathy has resolved. Reports pain in his left Knee for the last few days. Attributes that to his arthritis.  Vitals   Vitals:   06/05/21 1100 06/05/21 1230 06/05/21 1330 06/05/21 1425  BP: (!) 139/58 123/60 (!) 144/56 (!) 149/63  Pulse: 63 63 65 70  Resp: 14 13 16 14   Temp: 98.6 F (37 C)   98.8 F (37.1 C)  TempSrc: Oral   Oral  SpO2: 96% 92% 94% 96%  Weight:    81.6 kg  Height:    5\' 10"  (1.778 m)     Body mass index is 25.83 kg/m.  Physical Exam   General: Laying comfortably in bed; in no acute distress.  HENT: Normal oropharynx and mucosa. Normal external appearance of ears and nose.  Neck: Supple, no pain or tenderness  CV: No JVD. No peripheral edema.  Pulmonary: Symmetric Chest rise. Normal respiratory effort.  Abdomen: Soft to touch, non-tender.  Ext: No cyanosis, edema, or deformity  Skin: No rash. Normal palpation of skin.   Musculoskeletal: Normal digits and nails by inspection. No clubbing.   Neurologic Examination  Mental status/Cognition: Alert, oriented to self, place, month and year, good attention.  Speech/language: Fluent, comprehension intact, object naming intact, repetition intact.  Cranial nerves:   CN II Pupils equal and reactive to light, no VF deficits    CN III,IV,VI EOM intact, no gaze preference or deviation, no nystagmus    CN V normal sensation in V1, V2, and V3 segments bilaterally    CN VII no asymmetry, no nasolabial fold flattening    CN VIII normal hearing to speech    CN IX & X normal palatal elevation, no uvular deviation    CN XI 5/5 head turn and 5/5 shoulder shrug bilaterally    CN XII midline tongue protrusion    Motor:  Muscle bulk: normal, tone normal, pronator drift none tremor none Mvmt Root Nerve  Muscle Right Left Comments  SA C5/6 Ax Deltoid 5 5   EF C5/6  Mc Biceps 5 5   EE C6/7/8 Rad Triceps 5 5   WF C6/7 Med FCR     WE C7/8 PIN ECU     F Ab C8/T1 U ADM/FDI 5 5   HF L1/2/3 Fem Illopsoas 5 5   KE L2/3/4 Fem Quad 5 5   DF L4/5 D Peron Tib Ant 5 5   PF S1/2 Tibial Grc/Sol 5 5    Sensation:  Light touch Intact throughout   Pin prick    Temperature    Vibration   Proprioception    Coordination/Complex Motor:  - Finger to Nose intact BL - Heel to shin intact BL - Rapid alternating movement are normal  Labs   Basic Metabolic Panel:  Lab Results  Component Value Date   NA 134 (L) 06/05/2021   K 3.6 06/05/2021   CO2 31 06/05/2021   GLUCOSE 95 06/05/2021   BUN 22 06/05/2021   CREATININE 0.86 06/05/2021   CALCIUM 9.3 06/05/2021   GFRNONAA >60 06/05/2021   GFRAA >60 01/08/2020   HbA1c:  Lab Results  Component Value Date   HGBA1C 7.0 (H) 06/05/2021   LDL:  Lab Results  Component Value Date   LDLCALC 48 04/10/2021   Urine Drug Screen: No results found for: LABOPIA, Alexis, LABBENZ, AMPHETMU, THCU, LABBARB  Alcohol Level No results found for: ETH No results found for: PHENYTOIN, ZONISAMIDE, LAMOTRIGINE, LEVETIRACETA No results found for: PHENYTOIN, PHENOBARB, VALPROATE, CBMZ  Imaging and Diagnostic studies   MR BRAIN WO CONTRAST No acute intracranial process. No evidence of acute or subacute infarction.  Impression   Alexander Choi is a 86 y.o. male with PMH significant for hypertension, DM2, CAD status post CABG, prior history of NSTEMI, right total ostial status post amputation who presents with disorientation, encephalopathy, vertigo, nausea, RLE ataxia, subjective LLE weakness and a fall from recliner. Symptoms have essentially resolved.   Vertigo could be related to dehydration given elevated urine specific gravity with low sodium and chloride. No prior history of strokes. I think that his symptoms at presentation felt more like encephalopathy and disorientation with poor attention rather than focal  neurological deficit. It was very difficult to get him to do anything.  Reviewed MRI Brain without contrast which is negative for an acute stroke. Will get a routine EEG ut if negative for epileptiform discharges or seizures, no further workup.  Recommendations  - I ordered routine EEG. ______________________________________________________________________   Thank you for the opportunity to take part in the care of this patient. If you have any further questions, please contact the neurology consultation attending.  Signed,  [place] Pager Number [phone]

## 2021-06-05 NOTE — Progress Notes (Signed)
EEG complete - results pending 

## 2021-06-05 NOTE — Care Management Obs Status (Signed)
[place] NOTIFICATION   Patient Details  Name: Alexander Choi MRN: [id number] Date of Birth: 28-Apr-1933   Medicare Observation Status Notification Given:  Yes    Bethena Roys, RN 06/05/2021, 3:51 PM

## 2021-06-06 ENCOUNTER — Encounter (HOSPITAL_COMMUNITY): Payer: Self-pay | Admitting: Internal Medicine

## 2021-06-06 ENCOUNTER — Inpatient Hospital Stay (HOSPITAL_COMMUNITY): Payer: Medicare PPO

## 2021-06-06 DIAGNOSIS — E7800 Pure hypercholesterolemia, unspecified: Secondary | ICD-10-CM | POA: Diagnosis present

## 2021-06-06 DIAGNOSIS — I2510 Atherosclerotic heart disease of native coronary artery without angina pectoris: Secondary | ICD-10-CM | POA: Diagnosis present

## 2021-06-06 DIAGNOSIS — Z7984 Long term (current) use of oral hypoglycemic drugs: Secondary | ICD-10-CM | POA: Diagnosis not present

## 2021-06-06 DIAGNOSIS — J9601 Acute respiratory failure with hypoxia: Secondary | ICD-10-CM | POA: Diagnosis not present

## 2021-06-06 DIAGNOSIS — Z89421 Acquired absence of other right toe(s): Secondary | ICD-10-CM | POA: Diagnosis not present

## 2021-06-06 DIAGNOSIS — Z20822 Contact with and (suspected) exposure to covid-19: Secondary | ICD-10-CM | POA: Diagnosis not present

## 2021-06-06 DIAGNOSIS — Z79899 Other long term (current) drug therapy: Secondary | ICD-10-CM | POA: Diagnosis not present

## 2021-06-06 DIAGNOSIS — Z806 Family history of leukemia: Secondary | ICD-10-CM | POA: Diagnosis not present

## 2021-06-06 DIAGNOSIS — Z91013 Allergy to seafood: Secondary | ICD-10-CM | POA: Diagnosis not present

## 2021-06-06 DIAGNOSIS — R4182 Altered mental status, unspecified: Secondary | ICD-10-CM

## 2021-06-06 DIAGNOSIS — I5033 Acute on chronic diastolic (congestive) heart failure: Secondary | ICD-10-CM | POA: Diagnosis not present

## 2021-06-06 DIAGNOSIS — Z8546 Personal history of malignant neoplasm of prostate: Secondary | ICD-10-CM | POA: Diagnosis not present

## 2021-06-06 DIAGNOSIS — Z7982 Long term (current) use of aspirin: Secondary | ICD-10-CM | POA: Diagnosis not present

## 2021-06-06 DIAGNOSIS — Z833 Family history of diabetes mellitus: Secondary | ICD-10-CM | POA: Diagnosis not present

## 2021-06-06 DIAGNOSIS — E1122 Type 2 diabetes mellitus with diabetic chronic kidney disease: Secondary | ICD-10-CM | POA: Diagnosis not present

## 2021-06-06 DIAGNOSIS — N189 Chronic kidney disease, unspecified: Secondary | ICD-10-CM | POA: Diagnosis not present

## 2021-06-06 DIAGNOSIS — N400 Enlarged prostate without lower urinary tract symptoms: Secondary | ICD-10-CM | POA: Diagnosis present

## 2021-06-06 DIAGNOSIS — I130 Hypertensive heart and chronic kidney disease with heart failure and stage 1 through stage 4 chronic kidney disease, or unspecified chronic kidney disease: Secondary | ICD-10-CM | POA: Diagnosis not present

## 2021-06-06 DIAGNOSIS — Z8249 Family history of ischemic heart disease and other diseases of the circulatory system: Secondary | ICD-10-CM | POA: Diagnosis not present

## 2021-06-06 DIAGNOSIS — Z951 Presence of aortocoronary bypass graft: Secondary | ICD-10-CM | POA: Diagnosis not present

## 2021-06-06 DIAGNOSIS — R531 Weakness: Secondary | ICD-10-CM | POA: Diagnosis present

## 2021-06-06 DIAGNOSIS — K219 Gastro-esophageal reflux disease without esophagitis: Secondary | ICD-10-CM | POA: Diagnosis present

## 2021-06-06 DIAGNOSIS — E039 Hypothyroidism, unspecified: Secondary | ICD-10-CM | POA: Diagnosis not present

## 2021-06-06 DIAGNOSIS — E876 Hypokalemia: Secondary | ICD-10-CM | POA: Diagnosis not present

## 2021-06-06 DIAGNOSIS — Z79890 Hormone replacement therapy (postmenopausal): Secondary | ICD-10-CM | POA: Diagnosis not present

## 2021-06-06 DIAGNOSIS — E1151 Type 2 diabetes mellitus with diabetic peripheral angiopathy without gangrene: Secondary | ICD-10-CM | POA: Diagnosis not present

## 2021-06-06 LAB — CBC
HCT: 28.4 % — ABNORMAL LOW (ref 39.0–52.0)
Hemoglobin: 9.5 g/dL — ABNORMAL LOW (ref 13.0–17.0)
MCH: 32.1 pg (ref 26.0–34.0)
MCHC: 33.5 g/dL (ref 30.0–36.0)
MCV: 95.9 fL (ref 80.0–100.0)
Platelets: 178 10*3/uL (ref 150–400)
RBC: 2.96 MIL/uL — ABNORMAL LOW (ref 4.22–5.81)
RDW: 12.3 % (ref 11.5–15.5)
WBC: 5.4 10*3/uL (ref 4.0–10.5)
nRBC: 0 % (ref 0.0–0.2)

## 2021-06-06 LAB — BASIC METABOLIC PANEL
Anion gap: 8 (ref 5–15)
BUN: 18 mg/dL (ref 8–23)
CO2: 33 mmol/L — ABNORMAL HIGH (ref 22–32)
Calcium: 8.8 mg/dL — ABNORMAL LOW (ref 8.9–10.3)
Chloride: 95 mmol/L — ABNORMAL LOW (ref 98–111)
Creatinine, Ser: 0.87 mg/dL (ref 0.61–1.24)
GFR, Estimated: >60 mL/min (ref >60)
Glucose, Bld: 129 mg/dL — ABNORMAL HIGH (ref 70–99)
Potassium: 3.1 mmol/L — ABNORMAL LOW (ref 3.5–5.1)
Sodium: 136 mmol/L (ref 135–145)

## 2021-06-06 LAB — URINE CULTURE

## 2021-06-06 LAB — GLUCOSE, CAPILLARY
Glucose-Capillary: 118 mg/dL — ABNORMAL HIGH (ref 70–99)
Glucose-Capillary: 212 mg/dL — ABNORMAL HIGH (ref 70–99)
Glucose-Capillary: 155 mg/dL — ABNORMAL HIGH (ref 70–99)
Glucose-Capillary: 191 mg/dL — ABNORMAL HIGH (ref 70–99)

## 2021-06-06 MED ORDER — POTASSIUM CHLORIDE CRYS ER 20 MEQ PO TBCR
40.0000 meq | EXTENDED_RELEASE_TABLET | Freq: Every day | ORAL | Status: DC
  Administered 2021-06-06 – 2021-06-07 (×2): 40 meq via ORAL
  Filled 2021-06-06 (×2): qty 2

## 2021-06-06 MED ORDER — CALCIUM CARBONATE ANTACID 500 MG PO CHEW
1.0000 | CHEWABLE_TABLET | Freq: Three times a day (TID) | ORAL | Status: DC | PRN
  Administered 2021-06-06: 200 mg via ORAL
  Filled 2021-06-06: qty 1

## 2021-06-06 MED ORDER — ALUM & MAG HYDROXIDE-SIMETH 200-200-20 MG/5ML PO SUSP
30.0000 mL | Freq: Once | ORAL | Status: DC
  Filled 2021-06-06: qty 30

## 2021-06-06 MED ORDER — LIDOCAINE VISCOUS HCL 2 % MT SOLN
15.0000 mL | Freq: Once | OROMUCOSAL | Status: DC
  Filled 2021-06-06: qty 15

## 2021-06-06 MED ORDER — POTASSIUM CHLORIDE CRYS ER 20 MEQ PO TBCR
40.0000 meq | EXTENDED_RELEASE_TABLET | Freq: Once | ORAL | Status: AC
  Administered 2021-06-06: 40 meq via ORAL
  Filled 2021-06-06: qty 2

## 2021-06-06 MED ORDER — PANTOPRAZOLE SODIUM 40 MG PO TBEC
40.0000 mg | DELAYED_RELEASE_TABLET | Freq: Every day | ORAL | Status: DC
  Administered 2021-06-06 – 2021-06-07 (×2): 40 mg via ORAL
  Filled 2021-06-06 (×2): qty 1

## 2021-06-06 NOTE — Progress Notes (Signed)
PROGRESS NOTE  DEHAVEN SINE  NTI:[id number] DOB: [birth year] DOA: 06/04/2021 PCP: Nicoletta Dress, MD   Brief Narrative: BALEY LORIMER is an 87 y.o. symptoms with a history of CAD s/p CABG 2006, severe 3 vessel CAD on recent cath, T2DM, HTN, HLD, prostate CA who presented to the ED 12/9 after a fall at home due to left leg weakness. He presented as a code stroke. CT head was nonacute, no LVO on CTA. He was tachypneic and hypoxemic with cardiomegaly and pulmonary vascular congestion on CXR. Lasix was given and the patient admitted with neurology consulting. Subsequent brain MRI showed no evidence of stroke and EEG was normal.   Assessment & Plan: Principal Problem:   Left-sided weakness Active Problems:   Coronary artery disease   Hypertension   Hypercholesterolemia   Diabetes mellitus ([place])   Peripheral arterial disease (HCC)   CHF exacerbation (HCC)   Acute respiratory failure with hypoxia ([place])   Hypothyroidism   Fall at home, initial encounter  Fall due to left sided weakness: No stroke on neuroimaging and symptoms have improved. This symptom has been off and on for years.  - Neurology signed off unclear etiology of presenting complaint, recommends outpatient neurology follow up. Discussed with Dr. Lorrin Goodell - PT/OT currently recommending outpatient PT, no OT follow up.  Acute hypoxic respiratory failure due to acute on chronic HFpEF: BNP elevated. Echo Oct 2022 w/LVEF 60-65%, G1DD, normal RV, no severe valvular abnormality noted. D-dimer is equivocal, technically within age-adjusted normal limits. Between this, no pleuritic pain, and alternative cause for hypoxia, will defer CTA chest for now. - Repeat lasix 40mg  IV, hold ARB while IV diuresis is ongoing and BP low-normal. - Continue monitoring I/O, daily weights. 2.3L UOP over first 24 hours. Weight roughly stable.  - Check ambulatory pulse oximetry 12/12. Low threshold for CTA chest to r/o PE and see  parenchyma.  Hypokalemia:  - Supplement x2 today due to ongoing loop diuretic.   HTN:  - Continue norvasc 7.5mg , hold losartan as above.  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  - Continue aspirin, statin, ranexa (intolerant of higher doses of this)  PAD: ABIs from 09/2020 noted right ABI 0.75 TBI 0.39, and left ABI 0.7 5TBI 0.32.  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 Also hx right fourth toe amputation due to osteomyelitis. - Continue routine f/u with Dr. Gwenlyn Found. No critical ischemia currently noted.  - Continue ASA, statin.  T2DM: HbA1c 7% (probably goal for 87yo M) - Sensitive SSI while admitted, restart metformin, [facility] at discharge  BPH: Urinating freely - Continue finasteride  GERD:  - Continue antacid therapy  Hypothyroidism: TSH 0.585 - Continue synthroid 16mcg  HLD:  - Continue statin  DVT prophylaxis: Lovenox Code Status: Full Family Communication: None at bedside Disposition Plan:  Status is: Observation  The patient will require care spanning > 2 midnights and should be moved to inpatient because: The patient remains hypoxemic at rest. He dropped into 60%'s this morning per his RN during weaning trial. With pulmonary edema on CXR and crackles on exam, presumed Dx is acute CHF  Consultants:  Neurology  Procedures:  None  Antimicrobials: None   Subjective: Sleeping soundly but awakens with loud voice. Denies any further  left sided or other numbness or weakness. He is short of breath at times and was earlier when he dropped into 60%'s during oxygen weaning trial with RN today.  Objective: Vitals:   06/05/21 1425 06/05/21 1900 06/06/21 0500 06/06/21 1141  BP: (!) 149/63 140/60 (!) 127/52 (!) 127/51  Pulse: 70  75  (!) 51  Resp: 14 13 12 13   Temp: 98.8 F (37.1 C) 97.9 F (36.6 C) 98.6 F (37 C) 98.5 F (36.9 C)  TempSrc: Oral Oral Oral Oral  SpO2: 96%  92%   Weight: 81.6 kg  81.4 kg   Height: 5\' 10"  (1.778 m)       Intake/Output Summary (Last 24 hours) at 06/06/2021 1425 Last data filed at 06/06/2021 1418 Gross per 24 hour  Intake 240 ml  Output 2050 ml  Net -1810 ml   Filed Weights   06/04/21 1541 06/05/21 1425 06/06/21 0500  Weight: 81.6 kg 81.6 kg 81.4 kg   Gen: Elderly male in no distress Pulm: Non-labored breathing supplemental oxygen at rest, crackles at bases.  CV: Regular rate and rhythm. No murmur, rub, or gallop. No JVD, trace pedal edema. GI: Abdomen soft, non-tender, non-distended, with normoactive bowel sounds. No organomegaly or masses felt. Ext: Warm, no deformities Skin: No rashes, lesions or ulcers on visualized skin Neuro: Alert and oriented. No focal neurological deficits. Psych: Judgement and insight appear normal. Mood & affect appropriate.   Data Reviewed: I have personally reviewed following labs and imaging studies  CBC: Recent Labs  Lab 06/04/21 1509 06/04/21 1511 06/06/21 0321  WBC 8.7  --  5.4  NEUTROABS 5.1  --   --   HGB 10.8* 10.9* 9.5*  HCT 32.7* 32.0* 28.4*  MCV 98.8  --  95.9  PLT 198  --  299   Basic Metabolic Panel: Recent Labs  Lab 06/04/21 1509 06/04/21 1511 06/05/21 0735 06/06/21 0321  NA 133* 136 134* 136  K 4.1 4.2 3.6 3.1*  CL 97* 97* 95* 95*  CO2 28  --  31 33*  GLUCOSE 90 86 95 129*  BUN 20 23 22 18   CREATININE 0.86 0.80 0.86 0.87  CALCIUM 9.7  --  9.3 8.8*   GFR: Estimated Creatinine Clearance: 62.9 mL/min (by C-G formula based on SCr of 0.87 mg/dL). Liver Function Tests: Recent Labs  Lab 06/04/21 1509  AST 21  ALT 19  ALKPHOS 67  BILITOT 0.9  PROT 6.6  ALBUMIN 3.7   No results for input(s): LIPASE, AMYLASE in the last 168 hours. Recent Labs  Lab 06/04/21 2110  AMMONIA 23   Coagulation  Profile: Recent Labs  Lab 06/04/21 1509  INR 1.0   Cardiac Enzymes: No results for input(s): CKTOTAL, CKMB, CKMBINDEX, TROPONINI in the last 168 hours. BNP (last 3 results) No results for input(s): PROBNP in the last 8760 hours. HbA1C: Recent Labs    06/05/21 0739  HGBA1C 7.0*   CBG: Recent Labs  Lab 06/05/21 1126 06/05/21 1707 06/05/21 2112 06/06/21 0743 06/06/21 1140  GLUCAP 151* 137* 198* 118* 212*   Lipid Profile: No results for input(s): CHOL, HDL, LDLCALC, TRIG, CHOLHDL, LDLDIRECT in the last 72 hours. Thyroid Function Tests: Recent Labs    06/04/21 2110  TSH 0.585   Anemia Panel: No results for input(s): VITAMINB12, FOLATE, FERRITIN, TIBC, IRON, RETICCTPCT in the last 72 hours. Urine analysis:    Component Value Date/Time   COLORURINE AMBER (A) 06/05/2021 0220   APPEARANCEUR CLEAR 06/05/2021 0220   LABSPEC 1.044 (  H) 06/05/2021 0220   PHURINE 5.0 06/05/2021 0220   GLUCOSEU NEGATIVE 06/05/2021 0220   HGBUR NEGATIVE 06/05/2021 0220   BILIRUBINUR NEGATIVE 06/05/2021 0220   KETONESUR NEGATIVE 06/05/2021 0220   PROTEINUR NEGATIVE 06/05/2021 0220   NITRITE NEGATIVE 06/05/2021 0220   LEUKOCYTESUR NEGATIVE 06/05/2021 0220   Recent Results (from the past 240 hour(s))  Resp Panel by RT-PCR (Flu A&B, Covid) Nasopharyngeal Swab     Status: None   Collection Time: 06/04/21  9:04 PM   Specimen: Nasopharyngeal Swab; Nasopharyngeal(NP) swabs in vial transport medium  Result Value Ref Range Status   SARS Coronavirus 2 by RT PCR NEGATIVE NEGATIVE Final    Comment: (NOTE) SARS-CoV-2 target nucleic acids are NOT DETECTED.  The SARS-CoV-2 RNA is generally detectable in upper respiratory specimens during the acute phase of infection. The lowest concentration of SARS-CoV-2 viral copies this assay can detect is 138 copies/mL. A negative result does not preclude SARS-Cov-2 infection and should not be used as the sole basis for treatment or other patient management  decisions. A negative result may occur with  improper specimen collection/handling, submission of specimen other than nasopharyngeal swab, presence of viral mutation(s) within the areas targeted by this assay, and inadequate number of viral copies(<138 copies/mL). A negative result must be combined with clinical observations, patient history, and epidemiological information. The expected result is Negative.  Fact Sheet for Patients:  EntrepreneurPulse.com.au  Fact Sheet for Healthcare Providers:  IncredibleEmployment.be  This test is no t yet approved or cleared by the [place] FDA and  has been authorized for detection and/or diagnosis of SARS-CoV-2 by FDA under an Emergency Use Authorization (EUA). This EUA will remain  in effect (meaning this test can be used) for the duration of the COVID-19 declaration under Section 564(b)(1) of the Act, 21 U.S.C.section 360bbb-3(b)(1), unless the authorization is terminated  or revoked sooner.       Influenza A by PCR NEGATIVE NEGATIVE Final   Influenza B by PCR NEGATIVE NEGATIVE Final    Comment: (NOTE) The Xpert Xpress SARS-CoV-2/FLU/RSV plus assay is intended as an aid in the diagnosis of influenza from Nasopharyngeal swab specimens and should not be used as a sole basis for treatment. Nasal washings and aspirates are unacceptable for Xpert Xpress SARS-CoV-2/FLU/RSV testing.  Fact Sheet for Patients: EntrepreneurPulse.com.au  Fact Sheet for Healthcare Providers: IncredibleEmployment.be  This test is not yet approved or cleared by the [place] FDA and has been authorized for detection and/or diagnosis of SARS-CoV-2 by FDA under an Emergency Use Authorization (EUA). This EUA will remain in effect (meaning this test can be used) for the duration of the COVID-19 declaration under Section 564(b)(1) of the Act, 21 U.S.C. section 360bbb-3(b)(1), unless the  authorization is terminated or revoked.  Performed at [facility], [place] [address], [place], [place] [address]   Urine Culture     Status: Abnormal   Collection Time: 06/04/21  9:05 PM   Specimen: Urine, Clean Catch  Result Value Ref Range Status   Specimen Description URINE, CLEAN CATCH  Final   Special Requests   Final    NONE Performed at [facility], [place] [address], [place], [place] [address]    Culture MULTIPLE SPECIES PRESENT, SUGGEST RECOLLECTION (A)  Final   Report Status 06/06/2021 FINAL  Final      Radiology Studies: MR BRAIN WO CONTRAST  Result Date: 06/04/2021 CLINICAL DATA:  Left leg weakness for several days, concern for stroke EXAM: MRI HEAD WITHOUT  CONTRAST TECHNIQUE: Multiplanar, multiecho pulse sequences of the brain and surrounding structures were obtained without intravenous contrast. COMPARISON:  04/27/2020 FINDINGS: Brain: No restricted diffusion to suggest acute or subacute infarction. No acute hemorrhage, hydrocephalus, extra-axial collection, or mass lesion. The ventricles and sulci are within normal limits for age. T2 hyperintense signal in the periventricular white matter, likely the sequela of chronic small vessel ischemic disease. Vascular: Normal flow voids. Skull and upper cervical spine: Normal marrow signal. Sinuses/Orbits: Mucosal thickening in the right maxillary sinus. Status post bilateral lens replacements. Other: Trace fluid in the right-greater-than-left mastoid air cells. IMPRESSION: No acute intracranial process. No evidence of acute or subacute infarction. Electronically Signed   By: Merilyn Baba M.D.   On: 06/04/2021 19:48   DG Chest Portable 1 View  Result Date: 06/04/2021 CLINICAL DATA:  Cough, fatigue, chest pain. EXAM: PORTABLE CHEST 1 VIEW COMPARISON:  04/09/2021. FINDINGS: The heart is enlarged and there is evidence of prior cardiothoracic surgery. Atherosclerotic calcification of the aorta is noted. The pulmonary vasculature  is mildly distended. Lung volumes are low and there is chronic elevation the left diaphragm. Mild atelectasis is present at the left lung base. No effusion or pneumothorax is seen. No acute osseous abnormality. IMPRESSION: 1. Cardiomegaly with mild pulmonary vascular congestion. 2. Low lung volumes with chronic elevation the left diaphragm and atelectasis at the left lung base. Electronically Signed   By: Brett Fairy M.D.   On: 06/04/2021 21:23   EEG adult  Result Date: 06/06/2021 Lora Havens, MD     06/06/2021  8:19 AM Patient Name: CARLAS VANDYNE MRN: [id number] Epilepsy Attending: Lora Havens Referring Physician/Provider: Donnetta Simpers, MD Date: 06/05/2021 Duration: 22.57 mins Patient history: 87 y.o. male with PMH significant for hypertension, DM2, CAD status post CABG, prior history of NSTEMI, right total ostial status post amputation who presents with disorientation, encephalopathy, vertigo, nausea, RLE ataxia, subjective LLE weakness and a fall from recliner. EEG to evaluate for seizure. Level of alertness: Awake, drowsy AEDs during EEG study: None Technical aspects: This EEG study was done with scalp electrodes positioned according to the 10-20 International system of electrode placement. Electrical activity was acquired at a sampling rate of 500Hz  and reviewed with a high frequency filter of 70Hz  and a low frequency filter of 1Hz . EEG data were recorded continuously and digitally stored. Description: The posterior dominant rhythm consists of 8-9Hz  activity of moderate voltage (25-35 uV) seen predominantly in posterior head regions, symmetric and reactive to eye opening and eye closing. Drowsiness was characterized by attenuation of the posterior background rhythm. Hyperventilation and photic stimulation were not performed.   IMPRESSION: This study is within normal limits. No seizures or epileptiform discharges were seen throughout the recording. Lora Havens   CT HEAD CODE  STROKE WO CONTRAST  Result Date: 06/04/2021 CLINICAL DATA:  Code stroke.  Left-sided weakness EXAM: CT HEAD WITHOUT CONTRAST TECHNIQUE: Contiguous axial images were obtained from the base of the skull through the vertex without intravenous contrast. COMPARISON:  04/26/2020 FINDINGS: Brain: No evidence of acute infarction, hemorrhage, cerebral edema, mass, mass effect, or midline shift. Ventricles and sulci are normal for age. No extra-axial fluid collection. Periventricular white matter changes, likely the sequela of chronic small vessel ischemic disease. Mild cerebral atrophy. Vascular: No hyperdense vessel or unexpected calcification. Skull: Normal. Negative for fracture or focal lesion. Sinuses/Orbits: Mild mucosal thickening in the ethmoid air cells and right maxillary sinus. Status post bilateral lens replacements. Other: The mastoid air cells are  well aerated. ASPECTS [facility] Stroke Program Early CT Score) - Ganglionic level infarction (caudate, lentiform nuclei, internal capsule, insula, M1-M3 cortex): 7 - Supraganglionic infarction (M4-M6 cortex): 3 Total score (0-10 with 10 being normal): 10 IMPRESSION: 1. No acute intracranial process. 2. ASPECTS is 10 Code stroke imaging results were communicated on 06/04/2021 at 3:22 pm to provider Dr. Lorrin Goodell via secure text paging. Electronically Signed   By: Merilyn Baba M.D.   On: 06/04/2021 15:22   CT ANGIO HEAD NECK W WO CM (CODE STROKE)  Result Date: 06/04/2021 CLINICAL DATA:  Neuro deficit, acute, stroke suspected. EXAM: CT ANGIOGRAPHY HEAD AND NECK TECHNIQUE: Multidetector CT imaging of the head and neck was performed using the standard protocol during bolus administration of intravenous contrast. Multiplanar CT image reconstructions and MIPs were obtained to evaluate the vascular anatomy. Carotid stenosis measurements (when applicable) are obtained utilizing NASCET criteria, using the distal internal carotid diameter as the denominator. CONTRAST:   68mL OMNIPAQUE IOHEXOL 350 MG/ML SOLN COMPARISON:  Noncontrast head CT performed earlier today 06/04/2021. MRI brain and MRA head 04/27/2020. FINDINGS: CTA NECK FINDINGS Aortic arch: The left vertebral artery arises directly from the aortic arch. Atherosclerotic plaque within the visualized aortic arch and proximal major branch vessels of the neck. No hemodynamically significant innominate or proximal subclavian artery stenosis. Right carotid system: CCA and ICA patent within the neck without significant stenosis (50% or greater). Moderate predominantly calcified plaque within the distal CCA, carotid bifurcation and proximal ICA. Left carotid system: CCA and ICA patent within the neck without significant stenosis (50% or greater). Calcified plaque within the left carotid system. Most notably, moderate calcified plaque is present within the distal CCA, carotid bifurcation and proximal ICA Vertebral arteries: Vertebral arteries patent within the neck. Calcified plaque results in apparent severe stenosis at the origin of the right vertebral artery. No significant stenosis within the left cervical vertebral artery. Skeleton: Cervical dextrocurvature with partially imaged thoracic levocurvature. Cervical spondylosis. No acute bony abnormality or aggressive osseous lesion. Other neck: No neck mass or cervical lymphadenopathy. Upper chest: No consolidation within the imaged lung apices. Review of the MIP images confirms the above findings CTA HEAD FINDINGS Anterior circulation: The intracranial internal carotid arteries are patent. Plaque within both vessels. Mild stenosis within the paraclinoid right ICA. The M1 middle cerebral arteries are patent. No M2 proximal branch occlusion or high-grade proximal stenosis is identified. The anterior cerebral arteries are patent. The A1 right ACA is hypoplastic or absent. No intracranial aneurysm is identified. Posterior circulation: The intracranial vertebral arteries are patent.  Plaque within the V4 vertebral arteries, bilaterally, with no more than mild stenosis. Flow is present within the proximal right PICA. The left PICA is poorly delineated. The basilar artery is patent. The posterior cerebral arteries are patent. Posterior communicating arteries are diminutive or absent bilaterally. Venous sinuses: Within the limitations of contrast timing, no convincing thrombus. Anatomic variants: As described. Review of the MIP images confirms the above findings These results were called by telephone at the time of interpretation on 06/04/2021 at 4:07 pm to provider [facility] , who verbally acknowledged these results. IMPRESSION: CTA neck: 1. The common carotid and internal carotid arteries are patent within the neck without hemodynamically significant stenosis (50% or greater). Atherosclerotic plaque within the bilateral carotid systems within the neck, as described. 2. Vertebral arteries patent within the neck. Calcified plaque results in apparent severe stenosis at the origin of the right vertebral artery. CTA head: 1. The left PICA is poorly delineated.  This may be due to the developmentally small vessel size, high-grade stenosis or vessel occlusion. 2. Elsewhere, no intracranial large vessel occlusion or proximal high-grade arterial stenosis is identified. 3. Calcified plaque within the intracranial internal carotid arteries. Mild stenosis of the paraclinoid right ICA. 4. Calcified plaque within the V4 vertebral arteries, bilaterally, with no more than mild stenosis. Electronically Signed   By: Kellie Simmering D.O.   On: 06/04/2021 16:09    Scheduled Meds:  amLODipine  7.5 mg Oral QHS   aspirin  81 mg Oral Daily   atorvastatin  80 mg Oral Daily   enoxaparin (LOVENOX) injection  40 mg Subcutaneous Q24H   famotidine  40 mg Oral Daily   finasteride  5 mg Oral Daily   furosemide  40 mg Intravenous Daily   gabapentin  100 mg Oral BID   insulin aspart  0-5 Units Subcutaneous QHS    insulin aspart  0-9 Units Subcutaneous TID WC   levothyroxine  100 mcg Oral Q0600   losartan  100 mg Oral Daily   ranolazine  500 mg Oral BID   sodium chloride flush  3 mL Intravenous Q12H   Continuous Infusions:   LOS: 1 day   Time spent: 35 minutes.  Patrecia Pour, MD [facility] [email or web address] 06/06/2021, 2:25 PM

## 2021-06-06 NOTE — Progress Notes (Signed)
Brief Neuro Update:  Routine EEG is normal with no seizures or epileptiform discharge. No further inpatient workup recommended at this time. We will signoff. Plese feel free to contact us with any questions or concerns.  [place] Pager Number [phone]

## 2021-06-06 NOTE — Procedures (Signed)
Patient Name: Alexander Choi  MRN: [id number]  Epilepsy Attending: Lora Havens  Referring Physician/Provider: Donnetta Simpers, MD Date: 06/05/2021 Duration: 22.57 mins  Patient history: 87 y.o. male with PMH significant for hypertension, DM2, CAD status post CABG, prior history of NSTEMI, right total ostial status post amputation who presents with disorientation, encephalopathy, vertigo, nausea, RLE ataxia, subjective LLE weakness and a fall from recliner. EEG to evaluate for seizure.   Level of alertness: Awake, drowsy  AEDs during EEG study: None  Technical aspects: This EEG study was done with scalp electrodes positioned according to the 10-20 International system of electrode placement. Electrical activity was acquired at a sampling rate of 500Hz  and reviewed with a high frequency filter of 70Hz  and a low frequency filter of 1Hz . EEG data were recorded continuously and digitally stored.   Description: The posterior dominant rhythm consists of 8-9Hz  activity of moderate voltage (25-35 uV) seen predominantly in posterior head regions, symmetric and reactive to eye opening and eye closing. Drowsiness was characterized by attenuation of the posterior background rhythm. Hyperventilation and photic stimulation were not performed.     IMPRESSION: This study is within normal limits. No seizures or epileptiform discharges were seen throughout the recording.  Shlonda Dolloff Barbra Sarks

## 2021-06-06 NOTE — Progress Notes (Addendum)
Pt reporting he has not felt well much of the day with increased indigestion and belching that has improved with medications given. Pt also reporting constipated and bloating.  He has had 2 small bowel movements today.  Prune juice provided per patient request.  Abdomen distended with hypoactive bowel sounds, nontender.  Now c/o nausea and reports no flatulence.  Will give zofran per orders  for nausea and notify MD.

## 2021-06-06 NOTE — Progress Notes (Signed)
Physical Therapy Treatment Patient Details Name: Alexander Choi MRN: [id number] DOB: 1932/10/12 Today's Date: 06/06/2021   History of Present Illness Pt is an 87 y.o. male who presented 06/04/21 as a code stroke with L lower extremity weaknes, vertigo, and nausea. Pt also s/p fall from recliner. Outside window for TNKase. CT and MRI of head negative for acute stroke, suspecting metabolic encephalopathy. EEG was normal. PMH significant for hypertension, DM2, CAD status post CABG, prior history of NSTEMI, right total ostial status post amputation    PT Comments    Pt is demonstrating inconsistencies with mobility between nursing and therapy staff. RN reports pt required +2 heavy assistance to stand today due to a strong posterior bias, but pt only requiring min guard-minA to stand with PT today. RN did not try standing with the RW though, which may decrease pt's anxiety and thus his posterior lean. During PT eval yesterday he had a strong posterior lean also without the RW but improved with the RW. Pt also inconsistent with his SpO2 levels as RN reports his sats decreased to 60s% on RA earlier today but sats were >/= 92% on RA with PT. His SpO2 did this yesterday also per this PT's coordination with yesterday's RN. Pt does display deficits in balance, with a tendency to have LOB posteriorly when performing tasks without UE support. He does continue to display a posterior bias, but can correct it, when transferring to stand using a RW. Pt is at high risk for falls when not using the RW or at least having UE support. Pt will need to ensure he has at least 1 UE support for standing tasks and uses his RW when ambulating to improve his safety at home. In addition, pt could really benefit from frequent or constant assistance initially at d/c to ensure his safety due to his functional presentation wavering at times and him being at high risk for falls. Pt reports his sons are not available during the week when at  work this week, contradictory to belief during PT eval. Attempted to call pt's son to discuss pt's risks and need for more frequent assistance at this time, but no answer. Will continue to follow acutely. Provided pt has the necessary assistance at d/c, continue to recommend OP PT.     Recommendations for follow up therapy are one component of a multi-disciplinary discharge planning process, led by the attending physician.  Recommendations may be updated based on patient status, additional functional criteria and insurance authorization.  Follow Up Recommendations  Outpatient PT     Assistance Recommended at Discharge Frequent or constant Supervision/Assistance (initally)  Equipment Recommendations  Rolling walker (2 wheels)    Recommendations for Other Services       Precautions / Restrictions Precautions Precautions: Fall Precaution Comments: HOH; urinary incontinence Restrictions Weight Bearing Restrictions: No     Mobility  Bed Mobility Overal bed mobility: Modified Independent             General bed mobility comments: Pt able to transition supine <> sit in bed without issues.    Transfers Overall transfer level: Needs assistance Equipment used: Rolling walker (2 wheels);None Transfers: Sit to/from Stand Sit to Stand: Min assist;Min guard           General transfer comment: Pt coming to stand from low EOB, needing minA the first rep to steady but min guard the subsequent x8 reps. Pt coming to stand to RW 5x and to no device 4x. Pt does still desplay  a tendency to lean posteriorly slightly causing his forefeet to lift off the ground, but he tends to compensate through taking a small step to adjust his feet or multiple attempts to successfully come to stand safely.    Ambulation/Gait Ambulation/Gait assistance: Min guard;Min assist Gait Distance (Feet): 90 Feet Assistive device: Rolling walker (2 wheels) Gait Pattern/deviations: Step-through pattern;Decreased  stride length Gait velocity: reduced Gait velocity interpretation: <1.8 ft/sec, indicate of risk for recurrent falls   General Gait Details: Pt with slow gait speed, fatiguing after ~45 ft, requesting to return to room, x1 standing rest break but SpO2 WNL on RA. Min guard assist majority of time but posterior LOB 1x when he let go of the RW to don his mask, minA to recover.   Stairs             Wheelchair Mobility    Modified Rankin (Stroke Patients Only) Modified Rankin (Stroke Patients Only) Pre-Morbid Rankin Score: Slight disability Modified Rankin: Moderately severe disability     Balance Overall balance assessment: Needs assistance Sitting-balance support: No upper extremity supported;Feet supported Sitting balance-Leahy Scale: Fair Sitting balance - Comments: Supervision for safety sitting statically EOB Postural control: Posterior lean Standing balance support: Bilateral upper extremity supported;No upper extremity supported Standing balance-Leahy Scale: Fair Standing balance comment: Able to stand statically without UE support with mild trunk sway but no LOB. However, when reaching bil UEs off BOS or reaching more than min off BOS with 1 UE he does display bouts of LOB, needing up to minA to recover. Reliant on RW ot ambulate.                            Cognition Arousal/Alertness: Awake/alert Behavior During Therapy: WFL for tasks assessed/performed Overall Cognitive Status: Within Functional Limits for tasks assessed                                 General Comments: Pt not sure why he leans posteriorly with RNs.        Exercises      General Comments General comments (skin integrity, edema, etc.): Attempted to call son to discuss need for increased supervision/assistance at home and his fall risks, but no answer.      Pertinent Vitals/Pain Pain Assessment: Faces Faces Pain Scale: No hurt Pain Intervention(s): Monitored during  session    Home Living                          Prior Function            PT Goals (current goals can now be found in the care plan section) Acute Rehab PT Goals Patient Stated Goal: to get better PT Goal Formulation: With patient Time For Goal Achievement: 06/19/21 Potential to Achieve Goals: Good Progress towards PT goals: Progressing toward goals    Frequency    Min 3X/week      PT Plan Current plan remains appropriate    Co-evaluation              AM-PAC PT "6 Clicks" Mobility   Outcome Measure  Help needed turning from your back to your side while in a flat bed without using bedrails?: None Help needed moving from lying on your back to sitting on the side of a flat bed without using bedrails?: None Help needed moving to and from  a bed to a chair (including a wheelchair)?: A Little Help needed standing up from a chair using your arms (e.g., wheelchair or bedside chair)?: A Little Help needed to walk in hospital room?: A Little Help needed climbing 3-5 steps with a railing? : A Little 6 Click Score: 20    End of Session Equipment Utilized During Treatment: Gait belt Activity Tolerance: Patient tolerated treatment well Patient left: in bed;with call bell/phone within reach;with nursing/sitter in room (with RN giving pt meds stating she will return pt to supine) Nurse Communication: Mobility status PT Visit Diagnosis: Unsteadiness on feet (R26.81);Other abnormalities of gait and mobility (R26.89);Muscle weakness (generalized) (M62.81);History of falling (Z91.81);Difficulty in walking, not elsewhere classified (R26.2)     Time: 1650-1710 PT Time Calculation (min) (ACUTE ONLY): 20 min  Charges:  $Therapeutic Activity: 8-22 mins                     Moishe Spice, PT, DPT Acute Rehabilitation Services  Pager: [phone] Office: [place] 06/06/2021, 5:26 PM

## 2021-06-06 NOTE — Progress Notes (Signed)
This am patient was sleeping and desated to 80% on RA. He was easily awoken and sats up to upper 90s. He then desated to the 60's about 20 minutes later. I checked on him. He was asleep and easily arousable. No distress, pleth was good on sat machine. I placed patient on 2l [place] and he has had normal sats after. Dr. Bonner Puna made aware via secure chat

## 2021-06-07 LAB — BASIC METABOLIC PANEL
Anion gap: 8 (ref 5–15)
BUN: 26 mg/dL — ABNORMAL HIGH (ref 8–23)
CO2: 30 mmol/L (ref 22–32)
Calcium: 8.5 mg/dL — ABNORMAL LOW (ref 8.9–10.3)
Chloride: 96 mmol/L — ABNORMAL LOW (ref 98–111)
Creatinine, Ser: 0.77 mg/dL (ref 0.61–1.24)
GFR, Estimated: >60 mL/min (ref >60)
Glucose, Bld: 124 mg/dL — ABNORMAL HIGH (ref 70–99)
Potassium: 3.7 mmol/L (ref 3.5–5.1)
Sodium: 134 mmol/L — ABNORMAL LOW (ref 135–145)

## 2021-06-07 LAB — GLUCOSE, CAPILLARY: Glucose-Capillary: 137 mg/dL — ABNORMAL HIGH (ref 70–99)

## 2021-06-07 MED ORDER — FUROSEMIDE 40 MG PO TABS
40.0000 mg | ORAL_TABLET | Freq: Every day | ORAL | Status: DC
  Administered 2021-06-07: 40 mg via ORAL
  Filled 2021-06-07: qty 1

## 2021-06-07 MED ORDER — SENNOSIDES-DOCUSATE SODIUM 8.6-50 MG PO TABS: 1.0000 | ORAL_TABLET | Freq: Every day | ORAL | Status: DC

## 2021-06-07 MED ORDER — PANTOPRAZOLE SODIUM 40 MG PO TBEC: 40.0000 mg | DELAYED_RELEASE_TABLET | Freq: Every day | ORAL | 0 refills | Status: DC

## 2021-06-07 NOTE — Plan of Care (Signed)

## 2021-06-07 NOTE — Discharge Summary (Signed)
Physician Discharge Summary  [name] AUQ:[id number] DOB: 31-Jul-1932 DOA: 06/04/2021  PCP: Nicoletta Dress, MD  Admit date: 06/04/2021 Discharge date: 06/07/2021  Admitted From: Home Disposition: Home   Recommendations for Outpatient Follow-up:  Follow up with PCP in 1-2 weeks Consider GI evaluation for dyspepsia as outpatient. changed pepcid to pantoprazole. Follow up with neurology for left-sided weakness with negative inpatient work up.  Home Health: PT, OT, aide Equipment/Devices: Rolling walker Discharge Condition: Stable CODE STATUS: Full Diet recommendation: Heart healthy, carb-modified  Brief/Interim Summary: Alexander Choi is an 87 y.o. symptoms with a history of CAD s/p CABG 2006, severe 3 vessel CAD on recent cath, T2DM, HTN, HLD, prostate CA who presented to the ED 12/9 after a fall at home due to left leg weakness. He presented as a code stroke. CT head was nonacute, no LVO on CTA. He was tachypneic and hypoxemic with cardiomegaly and pulmonary vascular congestion on CXR. Lasix was given and the patient admitted with neurology consulting. Subsequent brain MRI showed no evidence of stroke and EEG was normal. He was given IV lasix with prompt resolution in hypoxia, plan to return to home oral dose at discharge.   Discharge Diagnoses:  Principal Problem:   Left-sided weakness Active Problems:   Coronary artery disease   Hypertension   Hypercholesterolemia   Diabetes mellitus ([place])   Peripheral arterial disease (HCC)   CHF exacerbation (HCC)   Acute respiratory failure with hypoxia ([place])   Hypothyroidism   Fall at home, initial encounter  Fall due to left sided weakness: No stroke on neuroimaging and symptoms have improved. This symptom has been off and on for years.  - Neurology signed off unclear etiology of presenting complaint, recommends outpatient neurology follow up. Discussed with Dr. Lorrin Goodell - PT/OT currently recommending home health, feel he's  stable functionally for discharge. Will be able to receive increased family assistance for a few days after discharge.    Acute hypoxic respiratory failure due to acute on chronic HFpEF: BNP elevated. Echo Oct 2022 w/LVEF 60-65%, G1DD, normal RV, no severe valvular abnormality noted. D-dimer is equivocal, technically within age-adjusted normal limits. Between this, no pleuritic pain, and alternative cause for hypoxia, will defer CTA chest for now. - Given IV lasix, ok to transition back to po lasix at discharge. - 100% on room air on day of discharge, no exertional hypoxemia on ambulatory pulse oximetry.     Hypokalemia:  - Supplemented    HTN:  - Continue home medications   CAD s/p CABG 2006, current 3 vessel CAD with culprit lesion suspected to be native LCx which has been unable to cross with a wire in the past: Negative troponin x2.  - Continue aspirin, statin, ranexa (intolerant of higher doses of this)   PAD: ABIs from 09/2020 noted right ABI 0.75 TBI 0.39, and left ABI 0.7 5TBI 0.32.  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 Also hx right fourth toe amputation due to osteomyelitis. - Continue routine f/u with Dr. Gwenlyn Found. No critical ischemia currently noted.  - Continue ASA, statin.   T2DM: HbA1c 7% (probably goal for 87yo M) - Restart metformin, [facility] at discharge   BPH: Urinating freely - Continue finasteride   GERD:  - Continue antacid therapy   Hypothyroidism: TSH 0.585 - Continue synthroid 144mcg   HLD:  - Continue statin  Discharge Instructions Discharge Instructions  Diet - low sodium heart healthy   Complete by: As directed    Discharge instructions   Complete by: As directed    You were evaluated for left sided weakness that has since improved. There has been no significant finding on your work up to explain your symptoms, though you will  benefit from continued physical therapy and should follow up with neurology as an outpatient. You can call the neurology clinic to schedule a follow up in the next 6-8 weeks. To help with GI symptoms, in addition to continuing medications like rolaids and zofran, you can change pepcid to pantoprazole which is a bit stronger acid-suppressing medication. Follow up with your PCP and you may benefit from GI evaluation as an outpatient if symptoms persist.  If your weakness returns, seek medical attention right away.   Increase activity slowly   Complete by: As directed       Allergies as of 06/07/2021       Reactions   Ace Inhibitors Other (See Comments)   unknown   Nitrates, Organic Swelling   Swelling in hands   Shellfish Allergy Nausea Only   Pt states scallops-not all shellfish-GI symptoms/ nausea -50+ years ago   Penicillins Rash        Medication List     STOP taking these medications    famotidine 40 MG tablet Commonly known as: PEPCID   NONFORMULARY OR COMPOUNDED ITEM       TAKE these medications    Accu-Chek Aviva Plus test strip Generic drug: glucose blood   Accu-Chek Softclix Lancets lancets   Accu-Chek Softclix Lancets lancets   acetaminophen 500 MG tablet Commonly known as: TYLENOL Take 1,000 mg by mouth every 6 (six) hours as needed for moderate pain or mild pain.   albuterol 108 (90 Base) MCG/ACT inhaler Commonly known as: VENTOLIN HFA Inhale 2 puffs into the lungs every 6 (six) hours as needed for wheezing or shortness of breath.   amLODipine 5 MG tablet Commonly known as: NORVASC Take 1.5 tablets (7.5 mg total) by mouth at bedtime.   aspirin 81 MG tablet Take 81 mg by mouth daily.   atorvastatin 80 MG tablet Commonly known as: LIPITOR Take 1 tablet (80 mg total) by mouth daily.   finasteride 5 MG tablet Commonly known as: PROSCAR Take 5 mg by mouth daily.   furosemide 40 MG tablet Commonly known as: LASIX Take 1 tablet (40 mg total) by  mouth daily.   gabapentin 100 MG capsule Commonly known as: NEURONTIN Take 100 mg by mouth 2 (two) times daily.   Gemtesa 75 MG Tabs Generic drug: Vibegron Take 1 tablet by mouth daily as needed.   glimepiride 4 MG tablet Commonly known as: AMARYL Take 4 mg by mouth daily with breakfast.   ICAPS AREDS 2 PO Take 1 Cartridge by mouth in the morning and at bedtime.   ITCHY EYE DROPS OP Apply 2 drops to eye daily as needed (itchyness).   levothyroxine 100 MCG tablet Commonly known as: SYNTHROID Take 1 tablet (100 mcg total) by mouth daily before breakfast.   losartan 100 MG tablet Commonly known as: COZAAR Take 100 mg by mouth daily.   meclizine 25 MG tablet Commonly known as: ANTIVERT Take 1 tablet (25 mg total) by mouth 3 (three) times daily as needed for dizziness.   metFORMIN 500 MG tablet Commonly known as: GLUCOPHAGE Take 1 tablet (500 mg total) by mouth 2 (two) times daily with a meal. Resume on 04/13/21.  nitroGLYCERIN 0.4 MG SL tablet Commonly known as: NITROSTAT Place 1 tablet (0.4 mg total) under the tongue every 5 (five) minutes as needed for chest pain.   ondansetron 4 MG tablet Commonly known as: ZOFRAN Take 1 tablet (4 mg total) by mouth every 8 (eight) hours as needed for nausea or vomiting.   pantoprazole 40 MG tablet Commonly known as: Protonix Take 1 tablet (40 mg total) by mouth daily.   ranolazine 500 MG 12 hr tablet Commonly known as: RANEXA Take 1 tablet (500 mg total) by mouth 2 (two) times daily.   vitamin B-12 1000 MCG tablet Commonly known as: CYANOCOBALAMIN Take 1 tablet (1,000 mcg total) by mouth daily.               Durable Medical Equipment  (From admission, onward)           Start     Ordered   06/07/21 0943  For home use only DME Walker rolling  Once       Question Answer Comment  Walker: With 5 Inch Wheels   Patient needs a walker to treat with the following condition General weakness      06/07/21 0942             Follow-up Information     [facility] Follow up.   Why: Rolling Walker -will be delivered to the room prior to transition home. Contact information: [place] [address] [phone]         Nicoletta Dress, MD Follow up.   Specialty: Internal Medicine Contact information: [place] [address] [phone]                Allergies  Allergen Reactions   Ace Inhibitors Other (See Comments)    unknown   Nitrates, Organic Swelling    Swelling in hands   Shellfish Allergy Nausea Only    Pt states scallops-not all shellfish-GI symptoms/ nausea -50+ years ago   Penicillins Rash    Consultations: Neurology  Procedures/Studies: DG Abd 1 View  Result Date: 06/06/2021 CLINICAL DATA:  Abdominal pain. EXAM: ABDOMEN - 1 VIEW COMPARISON:  07/30/2020. FINDINGS: The bowel gas pattern is normal. No definite renal calculus. Scattered calcifications are noted in the pelvis, possible phleboliths. Surgical clips are present in the right upper quadrant. Sternotomy wires are noted over the midline. Tiny radiopaque densities are seen in the region of the prostate gland, suggesting brachytherapy seeds. No acute osseous abnormality. IMPRESSION: No evidence of bowel obstruction. Electronically Signed   By: Brett Fairy M.D.   On: 06/06/2021 23:20   MR BRAIN WO CONTRAST  Result Date: 06/04/2021 CLINICAL DATA:  Left leg weakness for several days, concern for stroke EXAM: MRI HEAD WITHOUT CONTRAST TECHNIQUE: Multiplanar, multiecho pulse sequences of the brain and surrounding structures were obtained without intravenous contrast. COMPARISON:  04/27/2020 FINDINGS: Brain: No restricted diffusion to suggest acute or subacute infarction. No acute hemorrhage, hydrocephalus, extra-axial collection, or mass lesion. The ventricles and sulci are within normal limits for age. T2 hyperintense signal in the periventricular white  matter, likely the sequela of chronic small vessel ischemic disease. Vascular: Normal flow voids. Skull and upper cervical spine: Normal marrow signal. Sinuses/Orbits: Mucosal thickening in the right maxillary sinus. Status post bilateral lens replacements. Other: Trace fluid in the right-greater-than-left mastoid air cells. IMPRESSION: No acute intracranial process. No evidence of acute or subacute infarction. Electronically Signed   By: Francetta Found.D.  On: 06/04/2021 19:48   DG Chest Portable 1 View  Result Date: 06/04/2021 CLINICAL DATA:  Cough, fatigue, chest pain. EXAM: PORTABLE CHEST 1 VIEW COMPARISON:  04/09/2021. FINDINGS: The heart is enlarged and there is evidence of prior cardiothoracic surgery. Atherosclerotic calcification of the aorta is noted. The pulmonary vasculature is mildly distended. Lung volumes are low and there is chronic elevation the left diaphragm. Mild atelectasis is present at the left lung base. No effusion or pneumothorax is seen. No acute osseous abnormality. IMPRESSION: 1. Cardiomegaly with mild pulmonary vascular congestion. 2. Low lung volumes with chronic elevation the left diaphragm and atelectasis at the left lung base. Electronically Signed   By: Brett Fairy M.D.   On: 06/04/2021 21:23   EEG adult  Result Date: 06/06/2021 Lora Havens, MD     06/06/2021  8:19 AM Patient Name: JACORY KAMEL MRN: [id number] Epilepsy Attending: Lora Havens Referring Physician/Provider: Donnetta Simpers, MD Date: 06/05/2021 Duration: 22.57 mins Patient history: 87 y.o. male with PMH significant for hypertension, DM2, CAD status post CABG, prior history of NSTEMI, right total ostial status post amputation who presents with disorientation, encephalopathy, vertigo, nausea, RLE ataxia, subjective LLE weakness and a fall from recliner. EEG to evaluate for seizure. Level of alertness: Awake, drowsy AEDs during EEG study: None Technical aspects: This EEG study was done with  scalp electrodes positioned according to the 10-20 International system of electrode placement. Electrical activity was acquired at a sampling rate of 500Hz  and reviewed with a high frequency filter of 70Hz  and a low frequency filter of 1Hz . EEG data were recorded continuously and digitally stored. Description: The posterior dominant rhythm consists of 8-9Hz  activity of moderate voltage (25-35 uV) seen predominantly in posterior head regions, symmetric and reactive to eye opening and eye closing. Drowsiness was characterized by attenuation of the posterior background rhythm. Hyperventilation and photic stimulation were not performed.   IMPRESSION: This study is within normal limits. No seizures or epileptiform discharges were seen throughout the recording. Lora Havens   CT HEAD CODE STROKE WO CONTRAST  Result Date: 06/04/2021 CLINICAL DATA:  Code stroke.  Left-sided weakness EXAM: CT HEAD WITHOUT CONTRAST TECHNIQUE: Contiguous axial images were obtained from the base of the skull through the vertex without intravenous contrast. COMPARISON:  04/26/2020 FINDINGS: Brain: No evidence of acute infarction, hemorrhage, cerebral edema, mass, mass effect, or midline shift. Ventricles and sulci are normal for age. No extra-axial fluid collection. Periventricular white matter changes, likely the sequela of chronic small vessel ischemic disease. Mild cerebral atrophy. Vascular: No hyperdense vessel or unexpected calcification. Skull: Normal. Negative for fracture or focal lesion. Sinuses/Orbits: Mild mucosal thickening in the ethmoid air cells and right maxillary sinus. Status post bilateral lens replacements. Other: The mastoid air cells are well aerated. ASPECTS [facility] Stroke Program Early CT Score) - Ganglionic level infarction (caudate, lentiform nuclei, internal capsule, insula, M1-M3 cortex): 7 - Supraganglionic infarction (M4-M6 cortex): 3 Total score (0-10 with 10 being normal): 10 IMPRESSION: 1. No acute  intracranial process. 2. ASPECTS is 10 Code stroke imaging results were communicated on 06/04/2021 at 3:22 pm to provider Dr. Lorrin Goodell via secure text paging. Electronically Signed   By: Merilyn Baba M.D.   On: 06/04/2021 15:22   CT ANGIO HEAD NECK W WO CM (CODE STROKE)  Result Date: 06/04/2021 CLINICAL DATA:  Neuro deficit, acute, stroke suspected. EXAM: CT ANGIOGRAPHY HEAD AND NECK TECHNIQUE: Multidetector CT imaging of the head and neck was performed using the standard protocol during  bolus administration of intravenous contrast. Multiplanar CT image reconstructions and MIPs were obtained to evaluate the vascular anatomy. Carotid stenosis measurements (when applicable) are obtained utilizing NASCET criteria, using the distal internal carotid diameter as the denominator. CONTRAST:  19mL OMNIPAQUE IOHEXOL 350 MG/ML SOLN COMPARISON:  Noncontrast head CT performed earlier today 06/04/2021. MRI brain and MRA head 04/27/2020. FINDINGS: CTA NECK FINDINGS Aortic arch: The left vertebral artery arises directly from the aortic arch. Atherosclerotic plaque within the visualized aortic arch and proximal major branch vessels of the neck. No hemodynamically significant innominate or proximal subclavian artery stenosis. Right carotid system: CCA and ICA patent within the neck without significant stenosis (50% or greater). Moderate predominantly calcified plaque within the distal CCA, carotid bifurcation and proximal ICA. Left carotid system: CCA and ICA patent within the neck without significant stenosis (50% or greater). Calcified plaque within the left carotid system. Most notably, moderate calcified plaque is present within the distal CCA, carotid bifurcation and proximal ICA Vertebral arteries: Vertebral arteries patent within the neck. Calcified plaque results in apparent severe stenosis at the origin of the right vertebral artery. No significant stenosis within the left cervical vertebral artery. Skeleton: Cervical  dextrocurvature with partially imaged thoracic levocurvature. Cervical spondylosis. No acute bony abnormality or aggressive osseous lesion. Other neck: No neck mass or cervical lymphadenopathy. Upper chest: No consolidation within the imaged lung apices. Review of the MIP images confirms the above findings CTA HEAD FINDINGS Anterior circulation: The intracranial internal carotid arteries are patent. Plaque within both vessels. Mild stenosis within the paraclinoid right ICA. The M1 middle cerebral arteries are patent. No M2 proximal branch occlusion or high-grade proximal stenosis is identified. The anterior cerebral arteries are patent. The A1 right ACA is hypoplastic or absent. No intracranial aneurysm is identified. Posterior circulation: The intracranial vertebral arteries are patent. Plaque within the V4 vertebral arteries, bilaterally, with no more than mild stenosis. Flow is present within the proximal right PICA. The left PICA is poorly delineated. The basilar artery is patent. The posterior cerebral arteries are patent. Posterior communicating arteries are diminutive or absent bilaterally. Venous sinuses: Within the limitations of contrast timing, no convincing thrombus. Anatomic variants: As described. Review of the MIP images confirms the above findings These results were called by telephone at the time of interpretation on 06/04/2021 at 4:07 pm to provider [facility] , who verbally acknowledged these results. IMPRESSION: CTA neck: 1. The common carotid and internal carotid arteries are patent within the neck without hemodynamically significant stenosis (50% or greater). Atherosclerotic plaque within the bilateral carotid systems within the neck, as described. 2. Vertebral arteries patent within the neck. Calcified plaque results in apparent severe stenosis at the origin of the right vertebral artery. CTA head: 1. The left PICA is poorly delineated. This may be due to the developmentally small vessel  size, high-grade stenosis or vessel occlusion. 2. Elsewhere, no intracranial large vessel occlusion or proximal high-grade arterial stenosis is identified. 3. Calcified plaque within the intracranial internal carotid arteries. Mild stenosis of the paraclinoid right ICA. 4. Calcified plaque within the V4 vertebral arteries, bilaterally, with no more than mild stenosis. Electronically Signed   By: Kellie Simmering D.O.   On: 06/04/2021 16:09     Subjective: Had indigestion, nausea yesterday. Passing gas and had some small BMs but feels he's still constipated. XR overnight without large stool burden or obstruction. Exam benign. No nausea, indigestion, abd pain today. No report of dyspnea or chest pain.   Discharge Exam: Vitals:  06/07/21 0504 06/07/21 0821  BP: (!) 113/57   Pulse: 63 68  Resp: 13 12  Temp: 98.1 F (36.7 C)   SpO2: 97% 100%   General: Pt is alert, awake, not in acute distress Cardiovascular: RRR, S1/S2 +, no rubs, no gallops Respiratory: CTA bilaterally, no wheezing, no rhonchi Abdominal: Soft, NT, ND, bowel sounds + Extremities: No pitting edema, no cyanosis  Labs: BNP (last 3 results) Recent Labs    06/05/21 0733  BNP 130.8*   Basic Metabolic Panel: Recent Labs  Lab 06/04/21 1509 06/04/21 1511 06/05/21 0735 06/06/21 0321 06/07/21 0108  NA 133* 136 134* 136 134*  K 4.1 4.2 3.6 3.1* 3.7  CL 97* 97* 95* 95* 96*  CO2 28  --  31 33* 30  GLUCOSE 90 86 95 129* 124*  BUN 20 23 22 18  26*  CREATININE 0.86 0.80 0.86 0.87 0.77  CALCIUM 9.7  --  9.3 8.8* 8.5*   Liver Function Tests: Recent Labs  Lab 06/04/21 1509  AST 21  ALT 19  ALKPHOS 67  BILITOT 0.9  PROT 6.6  ALBUMIN 3.7   No results for input(s): LIPASE, AMYLASE in the last 168 hours. Recent Labs  Lab 06/04/21 2110  AMMONIA 23   CBC: Recent Labs  Lab 06/04/21 1509 06/04/21 1511 06/06/21 0321  WBC 8.7  --  5.4  NEUTROABS 5.1  --   --   HGB 10.8* 10.9* 9.5*  HCT 32.7* 32.0* 28.4*  MCV 98.8   --  95.9  PLT 198  --  178   Cardiac Enzymes: No results for input(s): CKTOTAL, CKMB, CKMBINDEX, TROPONINI in the last 168 hours. BNP: Invalid input(s): POCBNP CBG: Recent Labs  Lab 06/06/21 0743 06/06/21 1140 06/06/21 1613 06/06/21 2156 06/07/21 0737  GLUCAP 118* 212* 155* 191* 137*   D-Dimer Recent Labs    06/05/21 0735  DDIMER 0.86*   Hgb A1c Recent Labs    06/05/21 0739  HGBA1C 7.0*   Lipid Profile No results for input(s): CHOL, HDL, LDLCALC, TRIG, CHOLHDL, LDLDIRECT in the last 72 hours. Thyroid function studies Recent Labs    06/04/21 2110  TSH 0.585   Anemia work up No results for input(s): VITAMINB12, FOLATE, FERRITIN, TIBC, IRON, RETICCTPCT in the last 72 hours. Urinalysis    Component Value Date/Time   COLORURINE AMBER (A) 06/05/2021 0220   APPEARANCEUR CLEAR 06/05/2021 0220   LABSPEC 1.044 (H) 06/05/2021 0220   PHURINE 5.0 06/05/2021 0220   GLUCOSEU NEGATIVE 06/05/2021 0220   HGBUR NEGATIVE 06/05/2021 0220   BILIRUBINUR NEGATIVE 06/05/2021 0220   KETONESUR NEGATIVE 06/05/2021 0220   PROTEINUR NEGATIVE 06/05/2021 0220   NITRITE NEGATIVE 06/05/2021 0220   [place] 06/05/2021 0220    Microbiology Recent Results (from the past 240 hour(s))  Resp Panel by RT-PCR (Flu A&B, Covid) Nasopharyngeal Swab     Status: None   Collection Time: 06/04/21  9:04 PM   Specimen: Nasopharyngeal Swab; Nasopharyngeal(NP) swabs in vial transport medium  Result Value Ref Range Status   SARS Coronavirus 2 by RT PCR NEGATIVE NEGATIVE Final    Comment: (NOTE) SARS-CoV-2 target nucleic acids are NOT DETECTED.  The SARS-CoV-2 RNA is generally detectable in upper respiratory specimens during the acute phase of infection. The lowest concentration of SARS-CoV-2 viral copies this assay can detect is 138 copies/mL. A negative result does not preclude SARS-Cov-2 infection and should not be used as the sole basis for treatment or other patient management  decisions. A negative result may occur  with  improper specimen collection/handling, submission of specimen other than nasopharyngeal swab, presence of viral mutation(s) within the areas targeted by this assay, and inadequate number of viral copies(<138 copies/mL). A negative result must be combined with clinical observations, patient history, and epidemiological information. The expected result is Negative.  Fact Sheet for Patients:  EntrepreneurPulse.com.au  Fact Sheet for Healthcare Providers:  IncredibleEmployment.be  This test is no t yet approved or cleared by the [place] FDA and  has been authorized for detection and/or diagnosis of SARS-CoV-2 by FDA under an Emergency Use Authorization (EUA). This EUA will remain  in effect (meaning this test can be used) for the duration of the COVID-19 declaration under Section 564(b)(1) of the Act, 21 U.S.C.section 360bbb-3(b)(1), unless the authorization is terminated  or revoked sooner.       Influenza A by PCR NEGATIVE NEGATIVE Final   Influenza B by PCR NEGATIVE NEGATIVE Final    Comment: (NOTE) The Xpert Xpress SARS-CoV-2/FLU/RSV plus assay is intended as an aid in the diagnosis of influenza from Nasopharyngeal swab specimens and should not be used as a sole basis for treatment. Nasal washings and aspirates are unacceptable for Xpert Xpress SARS-CoV-2/FLU/RSV testing.  Fact Sheet for Patients: EntrepreneurPulse.com.au  Fact Sheet for Healthcare Providers: IncredibleEmployment.be  This test is not yet approved or cleared by the [place] FDA and has been authorized for detection and/or diagnosis of SARS-CoV-2 by FDA under an Emergency Use Authorization (EUA). This EUA will remain in effect (meaning this test can be used) for the duration of the COVID-19 declaration under Section 564(b)(1) of the Act, 21 U.S.C. section 360bbb-3(b)(1), unless the  authorization is terminated or revoked.  Performed at [facility], [place] [address] [place], [place] [address]   Urine Culture     Status: Abnormal   Collection Time: 06/04/21  9:05 PM   Specimen: Urine, Clean Catch  Result Value Ref Range Status   Specimen Description URINE, CLEAN CATCH  Final   Special Requests   Final    NONE Performed at [facility], [place] [address], [place], [place] [address]    Culture MULTIPLE SPECIES PRESENT, SUGGEST RECOLLECTION (A)  Final   Report Status 06/06/2021 FINAL  Final    Time coordinating discharge: Approximately 40 minutes  Patrecia Pour, MD  [facility] 06/07/2021, 10:29 AM

## 2021-06-07 NOTE — Progress Notes (Signed)
Heart Failure Nurse Navigator Progress Note  Pt screened for HV TOC clinic. Pt planned DC today. Admission related to L weakness and fall. Plan for follow up appts with PCP, neurology, GI.   No HV TOC clinic follow up needed.  Pricilla Holm, MSN, RN Heart Failure Nurse Navigator [phone]

## 2021-06-07 NOTE — TOC Initial Note (Signed)
Transition of Care [facility]) - Initial/Assessment Note    Patient Details  Name: Alexander Choi MRN: [id number] Date of Birth: [birth year]  Transition of Care [facility]) CM/SW Contact:    Bethena Roys, RN Phone Number: 06/07/2021, 11:38 AM  Clinical Narrative:  Case Manager spoke with the patient regarding disposition needs. Plan will be to return home with home health services. PT was able to work with the patient while the son was at the bedside-patient ambulated well with the rollator. Patient has a rollator at home and will not need a front wheeled walker. Patient and son are agreeable to home health PT- both have no preference to agency. Referral submitted to [facility] and they can service the patient within 24-48 hours post transition home. Case Manager did provide the family with personal care services brochures. Family has options on additional care in the home. Case Manager also discussed the option of life alert that senses when the patient is falling-family to look into that option. Patient already has a life alert that he has to push the button. No further needs identified for home.                 Expected Discharge Plan: [place] Barriers to Discharge: No Barriers Identified   Patient Goals and CMS Choice Patient states their goals for this hospitalization and ongoing recovery are:: to return home once stable.   Choice offered to / list presented to : Patient (Patient did not have a preference- notified [facility].)  Expected Discharge Plan and Services Expected Discharge Plan: [place] In-house Referral: NA Discharge Planning Services: CM Consult Post Acute Care Choice: [place] arrangements for the past 2 months: Single Family Home Expected Discharge Date: 06/07/21                         HH Arranged: PT HH Agency: [facility] Date [place]: 06/07/21 Time HH Agency Contacted:  [phone] Representative spoke with at [place]: Alvis Lemmings  Prior Living Arrangements/Services Living arrangements for the past 2 months: [place]   Patient language and need for interpreter reviewed:: Yes        Need for Family Participation in Patient Care: Yes (Comment) Care giver support system in place?: Yes (comment) Current home services: DME (Patient has rolling walker with seat.) Criminal Activity/Legal Involvement Pertinent to Current Situation/Hospitalization: No - Comment as needed  Activities of Daily Living Home Assistive Devices/Equipment: CBG Meter, Blood pressure cuff, Walker (specify type), Grab bars in shower, Cane (specify quad or straight), Eyeglasses, Grab bars around toilet, Raised toilet seat with rails ADL Screening (condition at time of admission) Patient's cognitive ability adequate to safely complete daily activities?: Yes Is the patient deaf or have difficulty hearing?: Yes Does the patient have difficulty seeing, even when wearing glasses/contacts?: No Does the patient have difficulty concentrating, remembering, or making decisions?: No Patient able to express need for assistance with ADLs?: Yes Does the patient have difficulty dressing or bathing?: No Independently performs ADLs?: Yes (appropriate for developmental age) Does the patient have difficulty walking or climbing stairs?: Yes Weakness of Legs: Both Weakness of Arms/Hands: Both  Permission Sought/Granted Permission sought to share information with : Case Manager, Customer service manager, Family Supports Permission granted to share information with : Yes, Verbal Permission Granted     Permission granted to share info w AGENCY: Alvis Lemmings  Emotional Assessment Appearance:: Appears stated age Attitude/Demeanor/Rapport: Engaged Affect (typically observed): Appropriate Orientation: : Oriented to Self, Oriented to Situation, Oriented to Place, Oriented to  Time Alcohol / Substance  Use: Not Applicable Psych Involvement: No (comment)  Admission diagnosis:  Weakness [R53.1] CHF exacerbation (HCC) [I50.9] Acute respiratory failure with hypoxia (HCC) [J96.01] Acute on chronic congestive heart failure, unspecified heart failure type [facility]) [I50.9] Patient Active Problem List   Diagnosis Date Noted   CHF exacerbation ([place]) 06/05/2021   Left-sided weakness 06/05/2021   Acute respiratory failure with hypoxia ([place]) 06/05/2021   Hypothyroidism 06/05/2021   Fall at home, initial encounter 06/05/2021   NSTEMI (non-ST elevated myocardial infarction) ([place]) 04/09/2021   Peripheral arterial disease ([place]) 10/16/2020   Atrial tachycardia ([place]) 12/16/2019   Bradycardia 12/16/2019   Unstable angina ([place]) 12/12/2019   Coronary artery disease    Hypertension    Hypercholesterolemia    Diabetes mellitus ([place])    PCP:  Nicoletta Dress, MD Pharmacy:   [facility] [address], [place] [address] Phone: [phone] Fax: [phone]  Readmission Risk Interventions No flowsheet data found.

## 2021-06-07 NOTE — Progress Notes (Signed)
Physical Therapy Treatment Patient Details Name: Alexander Choi MRN: [id number] DOB: Jun 17, 1933 Today's Date: 06/07/2021   History of Present Illness Pt is an 87 y.o. male who presented 06/04/21 as a code stroke with L lower extremity weaknes, vertigo, and nausea. Pt also s/p fall from recliner. Outside window for TNKase. CT and MRI of head negative for acute stroke, suspecting metabolic encephalopathy. EEG was normal. PMH significant for hypertension, DM2, CAD status post CABG, prior history of NSTEMI, right total ostial status post amputation    PT Comments    Pt making good progress with mobility. Feel pt can return home to his environment with intermittent supervision. At home pt has lift chair and life alert. Pt with history of falls at home. Recommended to son more frequent supervision for the first couple of days until pt is back in his home routine.    Recommendations for follow up therapy are one component of a multi-disciplinary discharge planning process, led by the attending physician.  Recommendations may be updated based on patient status, additional functional criteria and insurance authorization.  Follow Up Recommendations  Home health PT     Assistance Recommended at Discharge Intermittent Supervision/Assistance  Equipment Recommendations  None recommended by PT    Recommendations for Other Services       Precautions / Restrictions Precautions Precautions: Fall     Mobility  Bed Mobility Overal bed mobility: Modified Independent             General bed mobility comments: Incr time    Transfers Overall transfer level: Needs assistance Equipment used: Rolling walker (2 wheels);Rollator (4 wheels) Transfers: Sit to/from Stand Sit to Stand: Supervision           General transfer comment: Assist for safety. Pt with several attempts before rising on first trial. As pt repeated sit to stand he rose more easily    Ambulation/Gait Ambulation/Gait  assistance: Supervision Gait Distance (Feet): 260 Feet Assistive device: Rolling walker (2 wheels);Rollator (4 wheels) Gait Pattern/deviations: Step-through pattern;Decreased stride length Gait velocity: decr Gait velocity interpretation: 1.31 - 2.62 ft/sec, indicative of limited community ambulator   General Gait Details: Supervision for lines/safety. Pt used rolling walker for portion and rollator for portion of walk and no loss of balance with either. Easier turns using rollator and pt prefers rollator.   Stairs             Wheelchair Mobility    Modified Rankin (Stroke Patients Only)       Balance Overall balance assessment: Needs assistance Sitting-balance support: No upper extremity supported;Feet supported Sitting balance-Leahy Scale: Good     Standing balance support: No upper extremity supported Standing balance-Leahy Scale: Fair                              Cognition Arousal/Alertness: Awake/alert Behavior During Therapy: WFL for tasks assessed/performed Overall Cognitive Status: Within Functional Limits for tasks assessed                                 General Comments: Pt with some baseline short term memory deficits        Exercises Other Exercises Other Exercises: Repeat sit to stand x 3    General Comments        Pertinent Vitals/Pain Pain Assessment: No/denies pain    Home Living  Prior Function            PT Goals (current goals can now be found in the care plan section) Acute Rehab PT Goals Patient Stated Goal: to get better Progress towards PT goals: Progressing toward goals    Frequency    Min 3X/week      PT Plan Discharge plan needs to be updated;Equipment recommendations need to be updated    Co-evaluation              AM-PAC PT "6 Clicks" Mobility   Outcome Measure  Help needed turning from your back to your side while in a flat bed without  using bedrails?: None Help needed moving from lying on your back to sitting on the side of a flat bed without using bedrails?: None Help needed moving to and from a bed to a chair (including a wheelchair)?: A Little Help needed standing up from a chair using your arms (e.g., wheelchair or bedside chair)?: A Little Help needed to walk in hospital room?: A Little Help needed climbing 3-5 steps with a railing? : A Little 6 Click Score: 20    End of Session   Activity Tolerance: Patient tolerated treatment well Patient left: in chair;with call bell/phone within reach;with chair alarm set;with family/visitor present Nurse Communication: Mobility status PT Visit Diagnosis: Other abnormalities of gait and mobility (R26.89);Unsteadiness on feet (R26.81);Muscle weakness (generalized) (M62.81)     Time: 5597-4163 PT Time Calculation (min) (ACUTE ONLY): 34 min  Charges:  $Gait Training: 23-37 mins                     [place] Pager [phone] Office [place] 06/07/2021, 11:34 AM

## 2021-06-10 DIAGNOSIS — I5033 Acute on chronic diastolic (congestive) heart failure: Secondary | ICD-10-CM | POA: Diagnosis not present

## 2021-06-10 DIAGNOSIS — J9601 Acute respiratory failure with hypoxia: Secondary | ICD-10-CM | POA: Diagnosis not present

## 2021-06-10 DIAGNOSIS — Y92009 Unspecified place in unspecified non-institutional (private) residence as the place of occurrence of the external cause: Secondary | ICD-10-CM | POA: Diagnosis not present

## 2021-06-10 DIAGNOSIS — R531 Weakness: Secondary | ICD-10-CM | POA: Diagnosis not present

## 2021-06-10 DIAGNOSIS — W19XXXA Unspecified fall, initial encounter: Secondary | ICD-10-CM | POA: Diagnosis not present

## 2021-06-13 DIAGNOSIS — S0219XA Other fracture of base of skull, initial encounter for closed fracture: Secondary | ICD-10-CM | POA: Diagnosis not present

## 2021-06-13 DIAGNOSIS — W19XXXA Unspecified fall, initial encounter: Secondary | ICD-10-CM | POA: Diagnosis not present

## 2021-06-13 DIAGNOSIS — J320 Chronic maxillary sinusitis: Secondary | ICD-10-CM | POA: Diagnosis not present

## 2021-06-13 DIAGNOSIS — I672 Cerebral atherosclerosis: Secondary | ICD-10-CM | POA: Diagnosis not present

## 2021-06-13 DIAGNOSIS — S0001XA Abrasion of scalp, initial encounter: Secondary | ICD-10-CM | POA: Diagnosis not present

## 2021-06-13 DIAGNOSIS — M2578 Osteophyte, vertebrae: Secondary | ICD-10-CM | POA: Diagnosis not present

## 2021-06-13 DIAGNOSIS — I6523 Occlusion and stenosis of bilateral carotid arteries: Secondary | ICD-10-CM | POA: Diagnosis not present

## 2021-06-13 DIAGNOSIS — S199XXA Unspecified injury of neck, initial encounter: Secondary | ICD-10-CM | POA: Diagnosis not present

## 2021-06-13 DIAGNOSIS — J439 Emphysema, unspecified: Secondary | ICD-10-CM | POA: Diagnosis not present

## 2021-06-13 DIAGNOSIS — S0990XA Unspecified injury of head, initial encounter: Secondary | ICD-10-CM | POA: Diagnosis not present

## 2021-06-15 DIAGNOSIS — E1165 Type 2 diabetes mellitus with hyperglycemia: Secondary | ICD-10-CM | POA: Diagnosis not present

## 2021-06-15 DIAGNOSIS — E039 Hypothyroidism, unspecified: Secondary | ICD-10-CM | POA: Diagnosis not present

## 2021-06-15 DIAGNOSIS — E1140 Type 2 diabetes mellitus with diabetic neuropathy, unspecified: Secondary | ICD-10-CM | POA: Diagnosis not present

## 2021-06-15 DIAGNOSIS — R531 Weakness: Secondary | ICD-10-CM | POA: Diagnosis not present

## 2021-06-15 DIAGNOSIS — I25810 Atherosclerosis of coronary artery bypass graft(s) without angina pectoris: Secondary | ICD-10-CM | POA: Diagnosis not present

## 2021-06-15 DIAGNOSIS — D638 Anemia in other chronic diseases classified elsewhere: Secondary | ICD-10-CM | POA: Diagnosis not present

## 2021-06-15 DIAGNOSIS — I10 Essential (primary) hypertension: Secondary | ICD-10-CM | POA: Diagnosis not present

## 2021-06-15 DIAGNOSIS — E785 Hyperlipidemia, unspecified: Secondary | ICD-10-CM | POA: Diagnosis not present

## 2021-06-15 DIAGNOSIS — R2689 Other abnormalities of gait and mobility: Secondary | ICD-10-CM | POA: Diagnosis not present

## 2021-06-22 ENCOUNTER — Ambulatory Visit (INDEPENDENT_AMBULATORY_CARE_PROVIDER_SITE_OTHER): Payer: Medicare PPO | Admitting: Neurology

## 2021-06-22 ENCOUNTER — Encounter: Payer: Self-pay | Admitting: Neurology

## 2021-06-22 VITALS — BP 134/55 | HR 74 | Ht 70.0 in | Wt 180.0 lb

## 2021-06-22 DIAGNOSIS — G6289 Other specified polyneuropathies: Secondary | ICD-10-CM

## 2021-06-22 DIAGNOSIS — G629 Polyneuropathy, unspecified: Secondary | ICD-10-CM | POA: Insufficient documentation

## 2021-06-22 DIAGNOSIS — R55 Syncope and collapse: Secondary | ICD-10-CM | POA: Insufficient documentation

## 2021-06-22 DIAGNOSIS — R269 Unspecified abnormalities of gait and mobility: Secondary | ICD-10-CM

## 2021-06-22 NOTE — Progress Notes (Signed)
Chief Complaint  Patient presents with   New Patient (Initial Visit)    Room 14, alone  NP/ED referral for left sided weakness proficient referral from patients PCP Dr. Nelda Bucks for hospital f/u MB  Today C/o dizzy spells 2 weeks ago , reports many fall this year, last fall 06/07/21, states left leg gives out on him       ASSESSMENT AND PLAN  Alexander Choi is a 87 y.o. male   Fall episode  Patient denied loss of consciousness, most likely orthostatic syncope, related to his diabetes, today's examination also demonstrate orthostatic blood pressure change and tachycardia, in the setting of anemia  He has length dependent sensory changes, also right foot drop,  Differentiation diagnosis including diabetic peripheral neuropathy with superimposed right lumbar radiculopathy,  EMG nerve conduction study  He is already receiving outpatient physical therapy for gait training     DIAGNOSTIC DATA (LABS, IMAGING, TESTING) - I reviewed patient records, labs, notes, testing and imaging myself where available.   MEDICAL HISTORY:  Alexander Choi is a 87 year old male, seen in request by his primary care physician Dr. Nicoletta Dress, for evaluation of fall, following up for hospital discharge on June 07, 2021,  I reviewed and summarized the referring note. PMHX. HLD DM  CKD CAD Prostate Cancer  Patient is hard of hearing, patient uses walker at home, I reviewed hospital record on December 9-12 2022, after napping in his recliner for few hours, while trying to get up, he felt lightheaded, his l legs gave out underneath him, he fell, could not get up, no loss of consciousness, EMS was called, was reported that he was confused,  I personally reviewed CT head, MRI of the brain without contrast on June 04, 2021: No acute abnormality, no evidence of stroke, mild small vessel disease  CT angiogram of head and neck showed no large vessel disease Laboratory evaluation  showed A1c 7.0, CBC showed hemoglobin of 9.5, normal BMP, creatinine of 0.86, negative troponin, normal TSH, ammonia,  He does have long history of diabetes, bilateral feet numbness, had right fourth and fifth toes amputation due to nonhealing wound  Today also demonstrate orthostatic hypotension, tachycardia, sitting down blood pressure 108/59, heart rate of 73, standing up 94/53, heart rate of 92, standing up for 1 minute, 118/58 heart rate of 77  PHYSICAL EXAM:   Vitals:   06/22/21 0813  BP: (!) 134/55  Pulse: 74  Weight: 180 lb (81.6 kg)  Height: 5\' 10"  (1.778 m)   Not recorded     Body mass index is 25.83 kg/m.  PHYSICAL EXAMNIATION:  Gen: NAD, conversant, well nourised, well groomed                     Cardiovascular: Regular rate rhythm, no peripheral edema, warm, nontender. Eyes: Conjunctivae clear without exudates or hemorrhage Neck: Supple, no carotid bruits. Pulmonary: Clear to auscultation bilaterally   NEUROLOGICAL EXAM:  MENTAL STATUS: Speech:    Speech is normal; fluent and spontaneous with normal comprehension.  Cognition:     Orientation to time, place and person     Normal recent and remote memory     Normal Attention span and concentration     Normal Language, naming, repeating,spontaneous speech     Fund of knowledge   CRANIAL NERVES: CN II: Visual fields are full to confrontation. Pupils are round equal and briskly reactive to light. CN III, IV, VI: extraocular movement are normal. No ptosis.  CN V: Facial sensation is intact to light touch CN VII: Face is symmetric with normal eye closure  CN VIII: Hard of hearing, wearing hearing aid CN IX, X: Phonation is normal. CN XI: Head turning and shoulder shrug are intact  MOTOR: Limitation of left upper extremity examination due to left rotator cuff, felt to there was no significant upper extremity proximal and distal muscle weakness, mild bilateral hip flexion weakness, status post a right fourth and  fifth toe amputation, mild to moderate right ankle dorsiflexion weakness.  REFLEXES: Reflexes are 1 and symmetric at the biceps, triceps, absent at knees, and ankles. Plantar responses are flexor.  SENSORY: Length dependent decreased light touch, pinprick, vibratory sensation to knee level  COORDINATION: There is no trunk or limb dysmetria noted.  GAIT/STANCE: He needs push-up to get up from seated position, wide-based, unsteady, right foot drop,  REVIEW OF SYSTEMS:  Full 14 system review of systems performed and notable only for as above All other review of systems were negative.   ALLERGIES: Allergies  Allergen Reactions   Ace Inhibitors Other (See Comments)    unknown   Nitrates, Organic Swelling    Swelling in hands   Shellfish Allergy Nausea Only    Pt states scallops-not all shellfish-GI symptoms/ nausea -50+ years ago   Penicillins Rash    HOME MEDICATIONS: Current Outpatient Medications  Medication Sig Dispense Refill   Accu-Chek Softclix Lancets lancets      Accu-Chek Softclix Lancets lancets      acetaminophen (TYLENOL) 500 MG tablet Take 1,000 mg by mouth every 6 (six) hours as needed for moderate pain or mild pain.     albuterol (VENTOLIN HFA) 108 (90 Base) MCG/ACT inhaler Inhale 2 puffs into the lungs every 6 (six) hours as needed for wheezing or shortness of breath. 8 g 2   amLODipine (NORVASC) 5 MG tablet Take 1.5 tablets (7.5 mg total) by mouth at bedtime. 135 tablet 3   aspirin 81 MG tablet Take 81 mg by mouth daily.     atorvastatin (LIPITOR) 80 MG tablet Take 1 tablet (80 mg total) by mouth daily. 90 tablet 3   finasteride (PROSCAR) 5 MG tablet Take 5 mg by mouth daily.     furosemide (LASIX) 40 MG tablet Take 1 tablet (40 mg total) by mouth daily. 90 tablet 3   gabapentin (NEURONTIN) 100 MG capsule Take 100 mg by mouth 2 (two) times daily.     GEMTESA 75 MG TABS Take 1 tablet by mouth daily as needed.     glimepiride (AMARYL) 4 MG tablet Take 4 mg by  mouth daily with breakfast.     glucose blood (ACCU-CHEK AVIVA PLUS) test strip      Ketotifen Fumarate (ITCHY EYE DROPS OP) Apply 2 drops to eye daily as needed (itchyness).     levothyroxine (SYNTHROID) 100 MCG tablet Take 1 tablet (100 mcg total) by mouth daily before breakfast.     losartan (COZAAR) 100 MG tablet Take 100 mg by mouth daily.     meclizine (ANTIVERT) 25 MG tablet Take 1 tablet (25 mg total) by mouth 3 (three) times daily as needed for dizziness. 30 tablet 0   metFORMIN (GLUCOPHAGE) 500 MG tablet Take 1 tablet (500 mg total) by mouth 2 (two) times daily with a meal. Resume on 04/13/21.     Multiple Vitamins-Minerals (ICAPS AREDS 2 PO) Take 1 Cartridge by mouth in the morning and at bedtime.     nitroGLYCERIN (NITROSTAT) 0.4 MG SL  tablet Place 1 tablet (0.4 mg total) under the tongue every 5 (five) minutes as needed for chest pain. 90 tablet 3   ondansetron (ZOFRAN) 4 MG tablet Take 1 tablet (4 mg total) by mouth every 8 (eight) hours as needed for nausea or vomiting. 20 tablet 0   pantoprazole (PROTONIX) 40 MG tablet Take 1 tablet (40 mg total) by mouth daily. 30 tablet 0   ranolazine (RANEXA) 500 MG 12 hr tablet Take 1 tablet (500 mg total) by mouth 2 (two) times daily. 180 tablet 3   vitamin B-12 (CYANOCOBALAMIN) 1000 MCG tablet Take 1 tablet (1,000 mcg total) by mouth daily.     No current facility-administered medications for this visit.    PAST MEDICAL HISTORY: Past Medical History:  Diagnosis Date   [facility])    Chronic kidney disease    kidney stones   Coronary artery disease    with CABG in 2006 with LIMA to LAD, SVG to mid and distal OM, SVG to AM. Normal Myoview in May of 2013   Diabetes mellitus    Type 2   GERD (gastroesophageal reflux disease)    Hematuria    Hypercholesterolemia    Hypertension    Irritable bowel syndrome    Loose stools    Prostate cancer ([place])    Urinary obstruction     PAST SURGICAL HISTORY: Past Surgical History:   Procedure Laterality Date   CHOLECYSTECTOMY     CORONARY ARTERY BYPASS GRAFT  2006   LEFT HEART CATH AND CORS/GRAFTS ANGIOGRAPHY N/A 04/09/2021   Procedure: LEFT HEART CATH AND CORS/GRAFTS ANGIOGRAPHY;  Surgeon: [name], Peter M, MD;  Location: [facility];  Service: Cardiovascular;  Laterality: N/A;   LEFT HEART CATHETERIZATION WITH CORONARY/GRAFT ANGIOGRAM N/A 04/11/2014   Procedure: LEFT HEART CATHETERIZATION WITH [name];  Surgeon: Peter M [name], MD;  Location: [facility];  Service: Cardiovascular;  Laterality: N/A;   OTHER SURGICAL HISTORY     bowel obstruction   RADIOACTIVE SEED IMPLANT     for prostate cancer   RIGHT/LEFT HEART CATH AND CORONARY/GRAFT ANGIOGRAPHY N/A 12/12/2019   Procedure: RIGHT/LEFT HEART CATH AND CORONARY/GRAFT ANGIOGRAPHY;  Surgeon: [name], Peter M, MD;  Location: [facility];  Service: Cardiovascular;  Laterality: N/A;   SKIN CANCER EXCISION     TEMPORARY PACEMAKER N/A 12/13/2019   Procedure: TEMPORARY PACEMAKER;  Surgeon: [name], Peter M, MD;  Location: [facility];  Service: Cardiovascular;  Laterality: N/A;    FAMILY HISTORY: Family History  Problem Relation Age of Onset   Cancer Mother        Leukemia   Hypertension Mother    Cancer Father        Colon   Heart attack Father        Had Several in his 61's   Hypertension Father    Diabetes Father     SOCIAL HISTORY: Social History   Socioeconomic History   Marital status: Widowed    Spouse name: Not on file   Number of children: 3   Years of education: Not on file   Highest education level: Not on file  Occupational History   Occupation: state trooper    Comment: retired   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  Tobacco Use   Smoking status: Former    Packs/day: 3.00    Types: Cigarettes   Smokeless tobacco: Never  Vaping Use   Vaping Use: Never used  Substance and Sexual Activity   Alcohol use: No  Drug use: No   Sexual activity: Not Currently  Other  Topics Concern   Not on file  Social History Narrative   Not on file   Social Determinants of Health   Financial Resource Strain: Not on file  Food Insecurity: Not on file  Transportation Needs: Not on file  Physical Activity: Not on file  Stress: Not on file  Social Connections: Not on file  Intimate Partner Violence: Not on file      Marcial Pacas, M.D. Ph.D.  [facility] [address] [place], [place] [address] Ph: [phone] Fax: [phone]  CC:  Nicoletta Dress, MD [place],  [place] [address]  Nicoletta Dress, MD

## 2021-06-23 DIAGNOSIS — M25562 Pain in left knee: Secondary | ICD-10-CM | POA: Diagnosis not present

## 2021-06-23 DIAGNOSIS — M1712 Unilateral primary osteoarthritis, left knee: Secondary | ICD-10-CM | POA: Diagnosis not present

## 2021-07-19 DIAGNOSIS — M6289 Other specified disorders of muscle: Secondary | ICD-10-CM | POA: Diagnosis not present

## 2021-07-19 DIAGNOSIS — N3946 Mixed incontinence: Secondary | ICD-10-CM | POA: Diagnosis not present

## 2021-07-19 DIAGNOSIS — R159 Full incontinence of feces: Secondary | ICD-10-CM | POA: Diagnosis not present

## 2021-07-22 DIAGNOSIS — I25810 Atherosclerosis of coronary artery bypass graft(s) without angina pectoris: Secondary | ICD-10-CM | POA: Diagnosis not present

## 2021-07-22 DIAGNOSIS — I10 Essential (primary) hypertension: Secondary | ICD-10-CM | POA: Diagnosis not present

## 2021-07-22 DIAGNOSIS — E039 Hypothyroidism, unspecified: Secondary | ICD-10-CM | POA: Diagnosis not present

## 2021-07-22 DIAGNOSIS — E785 Hyperlipidemia, unspecified: Secondary | ICD-10-CM | POA: Diagnosis not present

## 2021-07-22 DIAGNOSIS — E1140 Type 2 diabetes mellitus with diabetic neuropathy, unspecified: Secondary | ICD-10-CM | POA: Diagnosis not present

## 2021-07-22 DIAGNOSIS — E1165 Type 2 diabetes mellitus with hyperglycemia: Secondary | ICD-10-CM | POA: Diagnosis not present

## 2021-07-22 DIAGNOSIS — D638 Anemia in other chronic diseases classified elsewhere: Secondary | ICD-10-CM | POA: Diagnosis not present

## 2021-08-03 DIAGNOSIS — M6289 Other specified disorders of muscle: Secondary | ICD-10-CM | POA: Diagnosis not present

## 2021-08-03 DIAGNOSIS — R159 Full incontinence of feces: Secondary | ICD-10-CM | POA: Diagnosis not present

## 2021-08-03 DIAGNOSIS — N3946 Mixed incontinence: Secondary | ICD-10-CM | POA: Diagnosis not present

## 2021-08-11 ENCOUNTER — Ambulatory Visit: Payer: Medicare PPO | Admitting: Neurology

## 2021-08-11 ENCOUNTER — Ambulatory Visit (INDEPENDENT_AMBULATORY_CARE_PROVIDER_SITE_OTHER): Payer: Medicare PPO | Admitting: Neurology

## 2021-08-11 ENCOUNTER — Other Ambulatory Visit: Payer: Self-pay

## 2021-08-11 DIAGNOSIS — R55 Syncope and collapse: Secondary | ICD-10-CM

## 2021-08-11 DIAGNOSIS — R269 Unspecified abnormalities of gait and mobility: Secondary | ICD-10-CM | POA: Diagnosis not present

## 2021-08-11 DIAGNOSIS — R202 Paresthesia of skin: Secondary | ICD-10-CM

## 2021-08-11 DIAGNOSIS — G6289 Other specified polyneuropathies: Secondary | ICD-10-CM | POA: Diagnosis not present

## 2021-08-11 DIAGNOSIS — Z113 Encounter for screening for infections with a predominantly sexual mode of transmission: Secondary | ICD-10-CM | POA: Diagnosis not present

## 2021-08-11 NOTE — Progress Notes (Addendum)
No chief complaint on file.     ASSESSMENT AND PLAN  [name] is a 88 y.o. male   Peripheral neuropathy Syncope  EMG nerve conduction study today confirmed evidence of peripheral neuropathy, with mixed axonal and demyelinating features, as evidenced by prolonged F-wave latency at bilateral ulnar motor responses, there was only mild active denervation at distal left muscles   This was not explained by his diabetes, which has always been under good control, A1c was less than 7,  He does have bilateral foot drop, areflexia, positive Romberg signs, right first dorsal interossei muscle atrophy, weakness, also developed urinary urgency, occasionally incontinence,  MRI of cervical spine, lumbar spine to rule out right cervical radiculopathy, lumbar radiculopathy  Laboratory evaluations, May consider lumbar puncture, Continue physical therapy, advised patient to bring his for next visit to discuss treatment plan     DIAGNOSTIC DATA (LABS, IMAGING, TESTING) - I reviewed patient records, labs, notes, testing and imaging myself where available.   MEDICAL HISTORY:  Alexander Choi is a 88 year old male, seen in request by his primary care physician Dr. Nicoletta Dress, for evaluation of fall, following up for hospital discharge on June 07, 2021,  I reviewed and summarized the referring note. PMHX. HLD DM  CKD CAD Prostate Cancer  Patient is hard of hearing, patient uses walker at home, I reviewed hospital record on December 9-12 2022, after napping in his recliner for few hours, while trying to get up, he felt lightheaded, his l legs gave out underneath him, he fell, could not get up, no loss of consciousness, EMS was called, was reported that he was confused,  I personally reviewed CT head, MRI of the brain without contrast on June 04, 2021: No acute abnormality, no evidence of stroke, mild small vessel disease  CT angiogram of head and neck showed no large vessel  disease Laboratory evaluation showed A1c 7.0, CBC showed hemoglobin of 9.5, normal BMP, creatinine of 0.86, negative troponin, normal TSH, ammonia,  He does have long history of diabetes, bilateral feet numbness, 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  As evident by prolonged bilateral ulnar F-wave latency, with only mild active denervation at distal left muscles, this would not explain explained by his well-controlled diabetes, A1c was always under 7  On examination he has distal weakness, bilateral foot drop, positive Romberg signs, areflexia, also have urinary urgency, occasionally incontinence,  PHYSICAL EXAM:   There were no vitals filed for this visit.  Not recorded     There is no height or weight on file to calculate BMI.  PHYSICAL EXAMNIATION:  Gen: NAD, conversant, well nourised, well groomed                     Cardiovascular: Regular rate rhythm, no peripheral edema, warm, nontender. Eyes: Conjunctivae clear without exudates or hemorrhage Neck: Supple, no carotid bruits. Pulmonary: Clear to auscultation bilaterally   NEUROLOGICAL EXAM:  MENTAL STATUS: Speech/cognition: Hard of hearing, awake, alert, oriented to history  taking and casual conversation   CRANIAL NERVES: CN II: Visual fields are full to confrontation. Pupils are round equal and briskly reactive to light. CN III, IV, VI: extraocular movement are normal. No ptosis. CN V:  Facial sensation is intact to light touch CN VII: Face is symmetric with normal eye closure  CN VIII: Hard of hearing, wearing hearing aid CN IX, X: Phonation is normal. CN XI: Head turning and shoulder shrug are intact  MOTOR: Limitation of left upper extremity examination due to left rotator cuff, right first dorsal interossei muscle mild atrophy, felt to there was no significant upper extremity proximal muscle weakness, mild right finger abduction, bilateral handgrip weakness.  mild bilateral hip flexion weakness, status post a right fourth and fifth toe amputation, mild to moderate right ankle dorsiflexion weakness.  REFLEXES: Areflexia, plantar responses were mute  SENSORY: Length dependent decreased light touch, pinprick, vibratory sensation to knee level, absent toe proprioception  COORDINATION: There is no trunk or limb dysmetria noted.  GAIT/STANCE: He needs push-up to get up from seated position, wide-based, unsteady, bilateral foot drop, flatfoot, positive Romberg signs  REVIEW OF SYSTEMS:  Full 14 system review of systems performed and notable only for as above All other review of systems were negative.   ALLERGIES: Allergies  Allergen Reactions   Ace Inhibitors Other (See Comments)    unknown   Nitrates, Organic Swelling    Swelling in hands   Shellfish Allergy Nausea Only    Pt states scallops-not all shellfish-GI symptoms/ nausea -50+ years ago   Penicillins Rash    HOME MEDICATIONS: Current Outpatient Medications  Medication Sig Dispense Refill   Accu-Chek Softclix Lancets lancets      Accu-Chek Softclix Lancets lancets      acetaminophen (TYLENOL) 500 MG tablet Take 1,000 mg by mouth every 6 (six) hours as needed for moderate pain or mild pain.     albuterol (VENTOLIN HFA) 108 (90 Base) MCG/ACT inhaler Inhale 2 puffs into the lungs every 6 (six) hours as needed for wheezing or shortness of breath. 8 g 2   amLODipine (NORVASC) 5 MG tablet Take 1.5 tablets  (7.5 mg total) by mouth at bedtime. 135 tablet 3   aspirin 81 MG tablet Take 81 mg by mouth daily.     atorvastatin (LIPITOR) 80 MG tablet Take 1 tablet (80 mg total) by mouth daily. 90 tablet 3   finasteride (PROSCAR) 5 MG tablet Take 5 mg by mouth daily.     furosemide (LASIX) 40 MG tablet Take 1 tablet (40 mg total) by mouth daily. 90 tablet 3   gabapentin (NEURONTIN) 100 MG capsule Take 100 mg by mouth 2 (two) times daily.     GEMTESA 75 MG TABS Take 1 tablet by mouth daily as needed.     glimepiride (AMARYL) 4 MG tablet Take 4 mg by mouth daily with breakfast.     glucose blood (ACCU-CHEK AVIVA PLUS) test strip      Ketotifen Fumarate (ITCHY EYE DROPS OP) Apply 2 drops to eye daily as needed (itchyness).     levothyroxine (SYNTHROID) 100 MCG tablet Take 1 tablet (100 mcg total) by mouth daily before breakfast.     losartan (COZAAR) 100 MG tablet Take 100 mg by mouth daily.     meclizine (ANTIVERT) 25 MG tablet Take 1 tablet (25 mg total) by mouth 3 (three) times daily as needed for dizziness. 30 tablet 0   metFORMIN (GLUCOPHAGE) 500 MG tablet Take 1 tablet (500 mg total)  by mouth 2 (two) times daily with a meal. Resume on 04/13/21.     Multiple Vitamins-Minerals (ICAPS AREDS 2 PO) Take 1 Cartridge by mouth in the morning and at bedtime.     nitroGLYCERIN (NITROSTAT) 0.4 MG SL tablet Place 1 tablet (0.4 mg total) under the tongue every 5 (five) minutes as needed for chest pain. 90 tablet 3   ondansetron (ZOFRAN) 4 MG tablet Take 1 tablet (4 mg total) by mouth every 8 (eight) hours as needed for nausea or vomiting. 20 tablet 0   pantoprazole (PROTONIX) 40 MG tablet Take 1 tablet (40 mg total) by mouth daily. 30 tablet 0   ranolazine (RANEXA) 500 MG 12 hr tablet Take 1 tablet (500 mg total) by mouth 2 (two) times daily. 180 tablet 3   vitamin B-12 (CYANOCOBALAMIN) 1000 MCG tablet Take 1 tablet (1,000 mcg total) by mouth daily.     No current facility-administered medications for this visit.     PAST MEDICAL HISTORY: Past Medical History:  Diagnosis Date   Cancer [facility])    Chronic kidney disease    kidney stones   Coronary artery disease    with CABG in 2006 with LIMA to LAD, SVG to mid and distal OM, SVG to AM. Normal Myoview in May of 2013   Diabetes mellitus    Type 2   GERD (gastroesophageal reflux disease)    Hematuria    Hypercholesterolemia    Hypertension    Irritable bowel syndrome    Loose stools    Prostate cancer ([place])    Urinary obstruction     PAST SURGICAL HISTORY: Past Surgical History:  Procedure Laterality Date   CHOLECYSTECTOMY     CORONARY ARTERY BYPASS GRAFT  2006   LEFT HEART CATH AND CORS/GRAFTS ANGIOGRAPHY N/A 04/09/2021   Procedure: LEFT HEART CATH AND CORS/GRAFTS ANGIOGRAPHY;  Surgeon: [name], Peter M, MD;  Location: [facility];  Service: Cardiovascular;  Laterality: N/A;   LEFT HEART CATHETERIZATION WITH CORONARY/GRAFT ANGIOGRAM N/A 04/11/2014   Procedure: LEFT HEART CATHETERIZATION WITH [name];  Surgeon: Peter M [name], MD;  Location: [facility] CATH LAB;  Service: Cardiovascular;  Laterality: N/A;   OTHER SURGICAL HISTORY     bowel obstruction   RADIOACTIVE SEED IMPLANT     for prostate cancer   RIGHT/LEFT HEART CATH AND CORONARY/GRAFT ANGIOGRAPHY N/A 12/12/2019   Procedure: RIGHT/LEFT HEART CATH AND CORONARY/GRAFT ANGIOGRAPHY;  Surgeon: [name], Peter M, MD;  Location: [facility];  Service: Cardiovascular;  Laterality: N/A;   SKIN CANCER EXCISION     TEMPORARY PACEMAKER N/A 12/13/2019   Procedure: TEMPORARY PACEMAKER;  Surgeon: [name], Peter M, MD;  Location: [facility];  Service: Cardiovascular;  Laterality: N/A;    FAMILY HISTORY: Family History  Problem Relation Age of Onset   Cancer Mother        Leukemia   Hypertension Mother    Cancer Father        Colon   Heart attack Father        Had Several in his 31's   Hypertension Father    Diabetes Father     SOCIAL HISTORY: Social History    Socioeconomic History   Marital status: Widowed    Spouse name: Not on file   Number of children: 3   Years of education: Not on file   Highest education level: Not on file  Occupational History   Occupation: state trooper    Comment: retired   Occupation: [employer]  Tobacco Use   Smoking status: Former    Packs/day: 3.00    Types: Cigarettes   Smokeless tobacco: Never  Vaping Use   Vaping Use: Never used  Substance and Sexual Activity   Alcohol use: No   Drug use: No   Sexual activity: Not Currently  Other Topics Concern   Not on file  Social History Narrative   Not on file   Social Determinants of Health   Financial Resource Strain: Not on file  Food Insecurity: Not on file  Transportation Needs: Not on file  Physical Activity: Not on file  Stress: Not on file  Social Connections: Not on file  Intimate Partner Violence: Not on file      Marcial Pacas, M.D. Ph.D.  [facility] [address] [place], [place] [address] Ph: [phone] Fax: [phone]  CC:  Nicoletta Dress, MD [place],  [place] [address]  Nicoletta Dress, MD

## 2021-08-11 NOTE — Procedures (Signed)
Full Name: Alexander Choi Gender: Male MRN #: [id number] Date of Birth: 1933-06-18    Visit Date: 08/11/2021 08:10 Age: 88 Years Examining Physician: Marcial Pacas, MD  Referring Physician: Marcial Pacas, MD Height: 5 feet 10 inch Patient History: 88 year old male, with history of progressive lower extremity paresthesia, gait abnormality  Summary of the test: Nerve conduction study: Bilateral sural, superficial peroneal, right ulnar sensory responses were absent.  Right radial sensory response showed normal distal latency, significantly decreased snap amplitude. Bilateral peroneal to EDB, left tibial motor responses were absent.  Right tibial motor response showed significantly decreased CMAP amplitude, slow conduction velocity.  Left ulnar motor responses showed normal distal latency, CMAP amplitude, conduction velocity, but moderately prolonged F-wave latency.  Right ulnar motor responses showed significantly decreased CMAP amplitude, with prolonged F-wave latency.  Left median motor response showed normal distal latency, mildly decreased CMAP amplitude, normal conduction velocity, but significantly prolonged F-wave latency.  Electromyography: Selected needle examination was performed at left lower extremity muscles, left lumbosacral paraspinal muscles; left upper extremity muscles, and left cervical paraspinal muscles  There is evidence of chronic neuropathic changes involving left tibialis anterior, peroneal longus, tibialis posterior, medial gastrocnemius, slight chronic neuropathic changes noted at left first dorsal interossei, left vastus lateralis.  There is only mild active denervation noted at left tibialis anterior, and tibialis posterior.  There was no spontaneous activity at left lumbosacral paraspinal or cervical paraspinal muscles, but the motor unit potential was polyphasic.  Conclusion: This is an abnormal study.  There is electrodiagnostic evidence of peripheral  neuropathy, with mixed demyelinating and axonal features, as evident by prolonged F-wave latency at bilateral ulnar, left median motor responses, only mild active denervation at distal left muscles.    ------------------------------- Marcial Pacas M.D. PhD  [facility] [address] [place], [place] [address] Tel: [phone] Fax: [phone]  Verbal informed consent was obtained from the patient, patient was informed of potential risk of procedure, including bruising, bleeding, hematoma formation, infection, muscle weakness, muscle pain, numbness, among others.        [place]    Nerve / Sites Muscle Latency Ref. Amplitude Ref. Rel Amp Segments Distance Velocity Ref. Area    ms ms mV mV %  cm m/s m/s mVms  L Median - APB     Wrist APB 4.1 ?4.4 3.5 ?4.0 100 Wrist - APB 7   15.6     Upper arm APB 8.7  3.0  86.9 Upper arm - Wrist 23 50 ?49 15.8  R Ulnar - ADM     Wrist ADM 3.1 ?3.3 1.3 ?6.0 100 Wrist - ADM 7   2.9     B.Elbow ADM NR  NR  NR B.Elbow - Wrist 20 NR ?49 NR     A.Elbow ADM NR  NR  NR A.Elbow - B.Elbow 10 NR ?49 NR  L Ulnar - ADM     Wrist ADM 3.2 ?3.3 6.1 ?6.0 100 Wrist - ADM 7   24.7     B.Elbow ADM 7.3  5.7  92.9 B.Elbow - Wrist 20 49 ?49 21.5     A.Elbow ADM 9.3  5.2  92.5 A.Elbow - B.Elbow 10 49 ?49 21.7  R Peroneal - EDB     Ankle EDB NR ?6.5 NR ?2.0 NR Ankle - EDB 9   NR     Fib head EDB NR  NR  NR Fib head - Ankle 30 NR ?44 NR  L Peroneal -  EDB     Ankle EDB NR ?6.5 NR ?2.0 NR Ankle - EDB 9   NR     Fib head EDB NR  NR  NR Fib head - Ankle 30 NR ?44 NR  R Tibial - AH     Ankle AH 4.2 ?5.8 0.4 ?4.0 100 Ankle - AH 9   1.5     Pop fossa AH 16.9  0.2  50.7 Pop fossa - Ankle 40 31 ?41 0.9  L Tibial - AH     Ankle AH NR ?5.8 NR ?4.0 NR Ankle - AH 9   NR                    SNC    Nerve / Sites Rec. Site Peak Lat Ref.  Amp Ref. Segments Distance    ms ms V V  cm  R Radial - Anatomical snuff box (Forearm)     Forearm Wrist 2.8 ?2.9 6 ?15  Forearm - Wrist 10  R Sural - Ankle (Calf)     Calf Ankle NR ?4.4 NR ?6 Calf - Ankle 14  L Sural - Ankle (Calf)     Calf Ankle NR ?4.4 NR ?6 Calf - Ankle 14  R Superficial peroneal - Ankle     Lat leg Ankle NR ?4.4 NR ?6 Lat leg - Ankle 14  L Superficial peroneal - Ankle     Lat leg Ankle NR ?4.4 NR ?6 Lat leg - Ankle 14  R Ulnar - Orthodromic, (Dig V, Mid palm)     Dig V Wrist NR ?3.1 NR ?5 Dig V - Wrist 76                 F  Wave    Nerve F Lat Ref.   ms ms  R Tibial - AH NR ?56.0  R Ulnar - ADM 36.0 ?32.0  L Ulnar - ADM 34.0 ?32.0  L Median - APB 35.2 ?31.0             EMG Summary Table    Spontaneous MUAP Recruitment  Muscle IA Fib PSW Fasc Other Amp Dur. Poly Pattern  L. Tibialis anterior Increased 1+ None None _______ Normal Normal Normal Reduced  L. Tibialis posterior Normal None None None _______ Normal Normal Normal Reduced  L. Peroneus longus Normal None None None _______ Normal Normal Normal Reduced  L. Gastrocnemius (Medial head) Normal None None None _______ Normal Normal Normal Reduced  L. Vastus lateralis Normal None None None _______ Normal Normal Normal Reduced  L. Lumbar paraspinals (low) Normal None None None _______ Normal Normal 1+ Normal  L. Lumbar paraspinals (mid) Normal None None None _______ Normal Normal 1+ Normal  L. First dorsal interosseous Increased None None None _______ Normal Normal Normal Reduced  L. Biceps brachii Normal None None None _______ Normal Normal Normal Normal  L. Deltoid Normal None None None _______ Normal Normal Normal Normal  L. Triceps brachii Normal None None None _______ Normal Normal Normal Normal  L. Brachioradialis Normal None None None _______ Normal Normal Normal Normal  L. Cervical paraspinals Normal None None None _______ Normal Normal Normal Normal

## 2021-08-12 ENCOUNTER — Telehealth: Payer: Self-pay | Admitting: Neurology

## 2021-08-12 DIAGNOSIS — R159 Full incontinence of feces: Secondary | ICD-10-CM | POA: Diagnosis not present

## 2021-08-12 DIAGNOSIS — M6289 Other specified disorders of muscle: Secondary | ICD-10-CM | POA: Diagnosis not present

## 2021-08-12 DIAGNOSIS — N3946 Mixed incontinence: Secondary | ICD-10-CM | POA: Diagnosis not present

## 2021-08-12 NOTE — Telephone Encounter (Signed)
Mcarthur Rossetti Josem Kaufmann: [phone] (exp. 08/12/21 to 09/11/21) order sent to GI, they will reach out to the patient to schedule.

## 2021-08-14 LAB — MULTIPLE MYELOMA PANEL, SERUM
Albumin SerPl Elph-Mcnc: 3.4 g/dL (ref 2.9–4.4)
Albumin/Glob SerPl: 1.2 (ref 0.7–1.7)
Alpha 1: 0.2 g/dL (ref 0.0–0.4)
Alpha2 Glob SerPl Elph-Mcnc: 0.9 g/dL (ref 0.4–1.0)
B-Globulin SerPl Elph-Mcnc: 0.9 g/dL (ref 0.7–1.3)
Gamma Glob SerPl Elph-Mcnc: 0.9 g/dL (ref 0.4–1.8)
Globulin, Total: 2.9 g/dL (ref 2.2–3.9)
IgA/Immunoglobulin A, Serum: 151 mg/dL (ref 61–437)
IgG (Immunoglobin G), Serum: 868 mg/dL (ref 603–1613)
IgM (Immunoglobulin M), Srm: 70 mg/dL (ref 15–143)
Total Protein: 6.3 g/dL (ref 6.0–8.5)

## 2021-08-14 LAB — ANA W/REFLEX IF POSITIVE: Anti Nuclear Antibody (ANA): NEGATIVE

## 2021-08-14 LAB — C-REACTIVE PROTEIN: CRP: <1 mg/L (ref 0–10)

## 2021-08-14 LAB — RPR: RPR Ser Ql: NONREACTIVE

## 2021-08-14 LAB — SEDIMENTATION RATE: Sed Rate: 6 mm/hr (ref 0–30)

## 2021-08-16 ENCOUNTER — Telehealth: Payer: Self-pay | Admitting: *Deleted

## 2021-08-16 NOTE — Progress Notes (Signed)
Labs are normal.

## 2021-08-16 NOTE — Telephone Encounter (Signed)
Left message that labs were normal. Provided our number to call back with any questions.

## 2021-08-16 NOTE — Telephone Encounter (Signed)
-----   Message from Genia Harold, MD sent at 08/16/2021  8:16 AM EST ----- Labs are normal

## 2021-08-18 DIAGNOSIS — R159 Full incontinence of feces: Secondary | ICD-10-CM | POA: Diagnosis not present

## 2021-08-18 DIAGNOSIS — N3946 Mixed incontinence: Secondary | ICD-10-CM | POA: Diagnosis not present

## 2021-08-18 DIAGNOSIS — M6289 Other specified disorders of muscle: Secondary | ICD-10-CM | POA: Diagnosis not present

## 2021-08-25 DIAGNOSIS — M6289 Other specified disorders of muscle: Secondary | ICD-10-CM | POA: Diagnosis not present

## 2021-08-25 DIAGNOSIS — R159 Full incontinence of feces: Secondary | ICD-10-CM | POA: Diagnosis not present

## 2021-08-25 DIAGNOSIS — N3946 Mixed incontinence: Secondary | ICD-10-CM | POA: Diagnosis not present

## 2021-08-30 ENCOUNTER — Telehealth: Payer: Self-pay

## 2021-08-30 NOTE — Telephone Encounter (Signed)
Called patient to advise appointment for 3/8 will need to rescheduled. Left message for patient to call office. ?

## 2021-08-31 DIAGNOSIS — L570 Actinic keratosis: Secondary | ICD-10-CM | POA: Diagnosis not present

## 2021-08-31 DIAGNOSIS — L578 Other skin changes due to chronic exposure to nonionizing radiation: Secondary | ICD-10-CM | POA: Diagnosis not present

## 2021-08-31 DIAGNOSIS — L821 Other seborrheic keratosis: Secondary | ICD-10-CM | POA: Diagnosis not present

## 2021-08-31 DIAGNOSIS — T148XXA Other injury of unspecified body region, initial encounter: Secondary | ICD-10-CM | POA: Diagnosis not present

## 2021-08-31 DIAGNOSIS — C44329 Squamous cell carcinoma of skin of other parts of face: Secondary | ICD-10-CM | POA: Diagnosis not present

## 2021-09-01 ENCOUNTER — Ambulatory Visit: Payer: Medicare PPO | Admitting: Physician Assistant

## 2021-09-02 DIAGNOSIS — M6289 Other specified disorders of muscle: Secondary | ICD-10-CM | POA: Diagnosis not present

## 2021-09-02 DIAGNOSIS — R159 Full incontinence of feces: Secondary | ICD-10-CM | POA: Diagnosis not present

## 2021-09-02 DIAGNOSIS — N3946 Mixed incontinence: Secondary | ICD-10-CM | POA: Diagnosis not present

## 2021-09-04 IMAGING — CT CT ANGIO CHEST
2 of 8 series · 18 of 46 positions shown · IV contrast (OMNIPAQUE 350)
Comparison: 05/11/2019

CLINICAL DATA: Shortness of breath

EXAM:
CT ANGIOGRAPHY CHEST WITH CONTRAST
TECHNIQUE: Multidetector CT imaging of the chest was performed using the
standard protocol during bolus administration of intravenous
contrast. Multiplanar CT image reconstructions and MIPs were
obtained to evaluate the vascular anatomy.
CONTRAST:  80mL OMNIPAQUE IOHEXOL 350 MG/ML SOLN

[Series 5: thins · axial · 0.78mm/px · z∈[-281,-25]mm · 15 of 290 slices shown]
[im 17/290  lung]
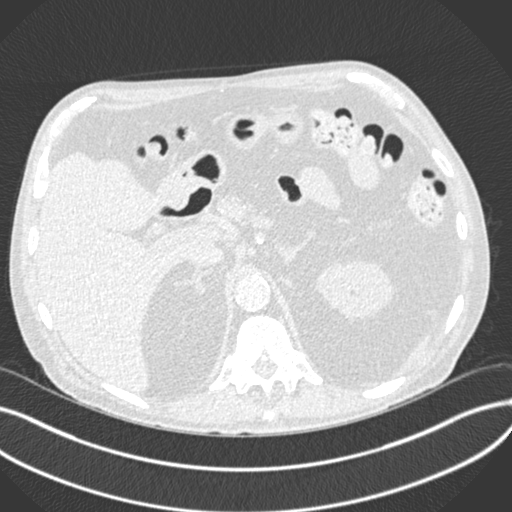
[im 33/290  soft-tissue]
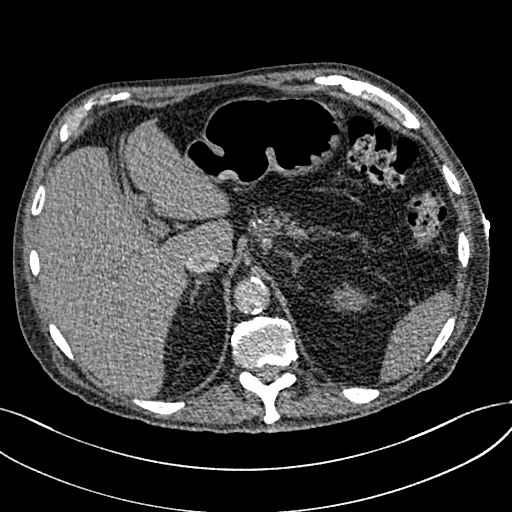
[im 49/290  lung]
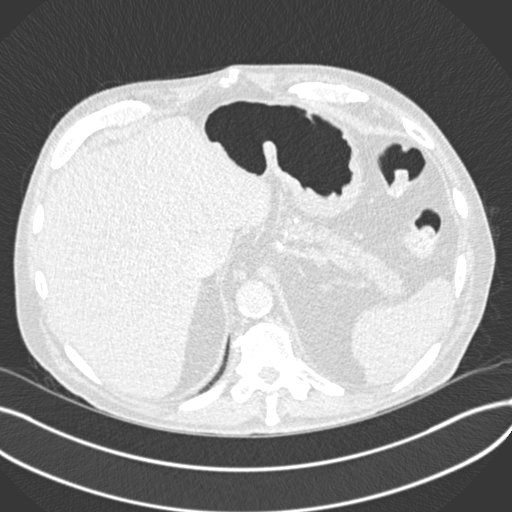
[im 65/290  soft-tissue]
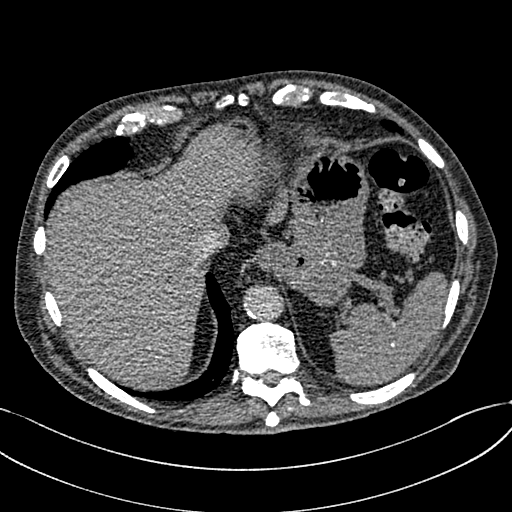
[im 97/290  lung]
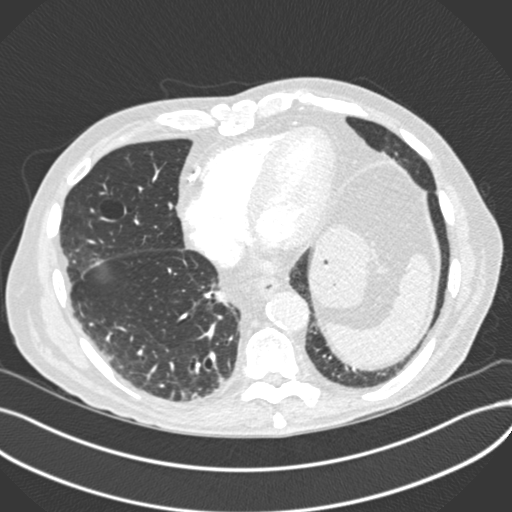
[im 113/290  soft-tissue]
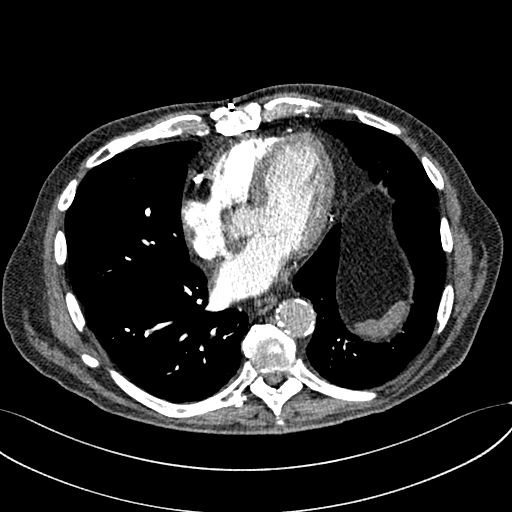
[im 129/290  lung]
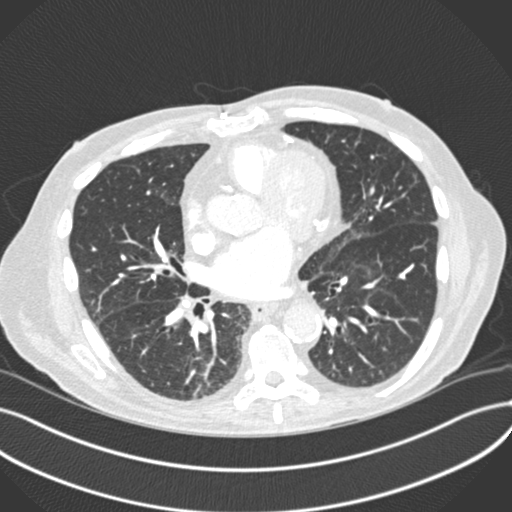
[im 145/290  soft-tissue]
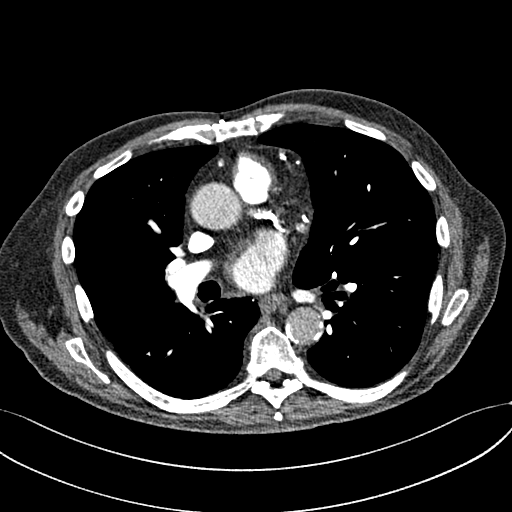
[im 161/290  lung]
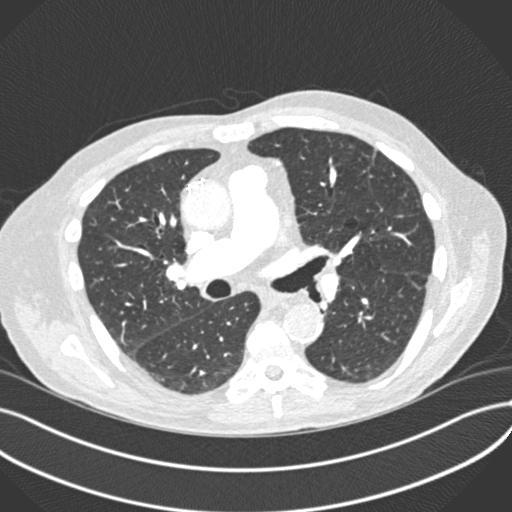
[im 177/290  soft-tissue]
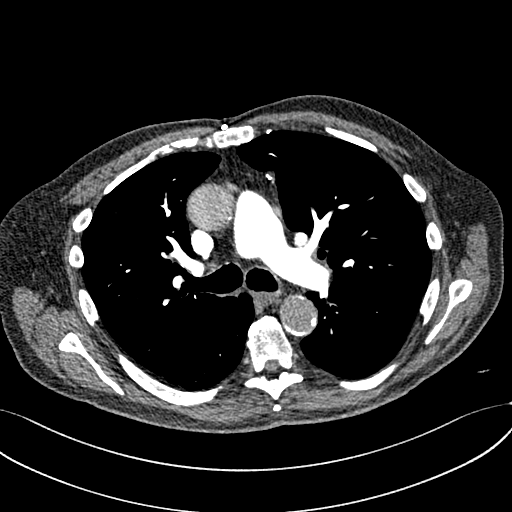
[im 193/290  lung]
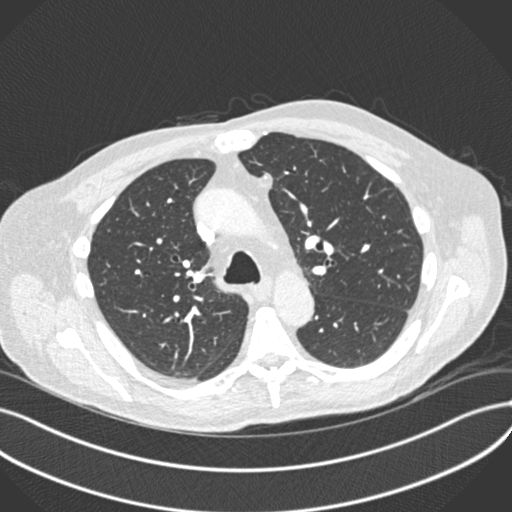
[im 225/290  soft-tissue]
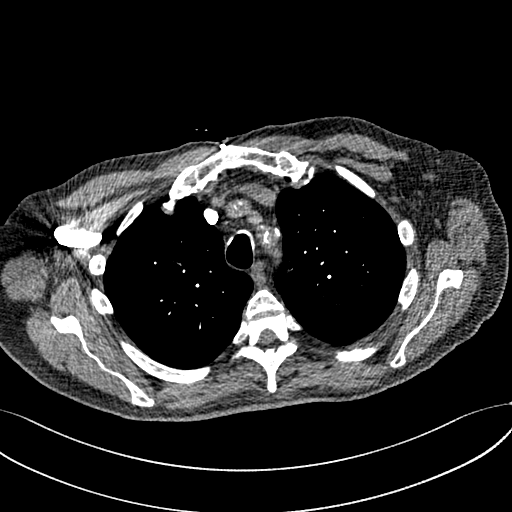
[im 241/290  lung]
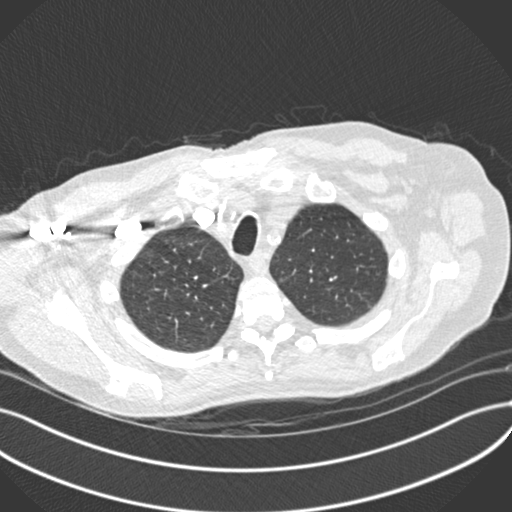
[im 257/290  soft-tissue]
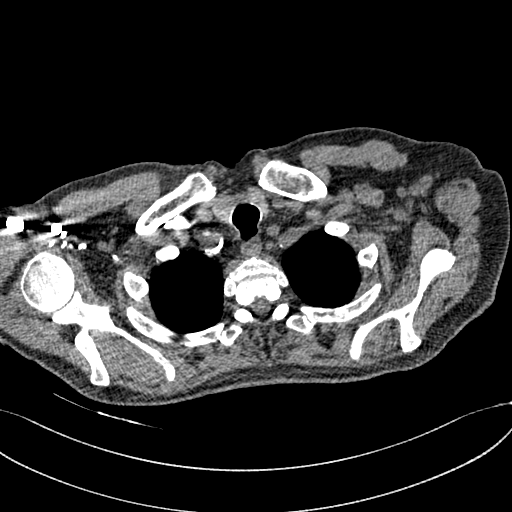
[im 273/290  lung]
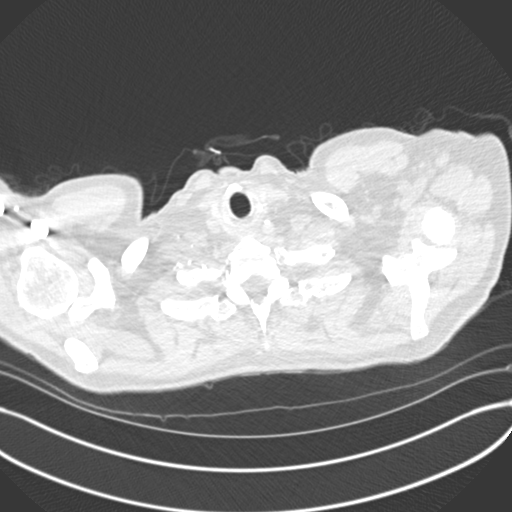

[Series 7: coronal mpr · coronal · 0.59mm/px · 3 of 133 slices shown]
[im 34/133  soft-tissue]
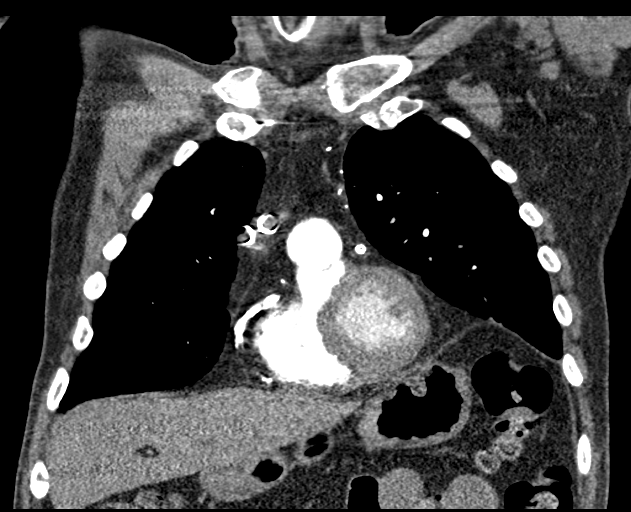
[im 67/133  soft-tissue]
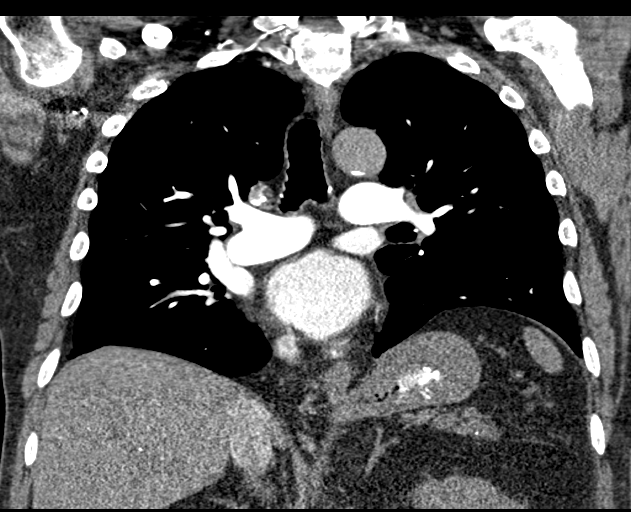
[im 100/133  soft-tissue]
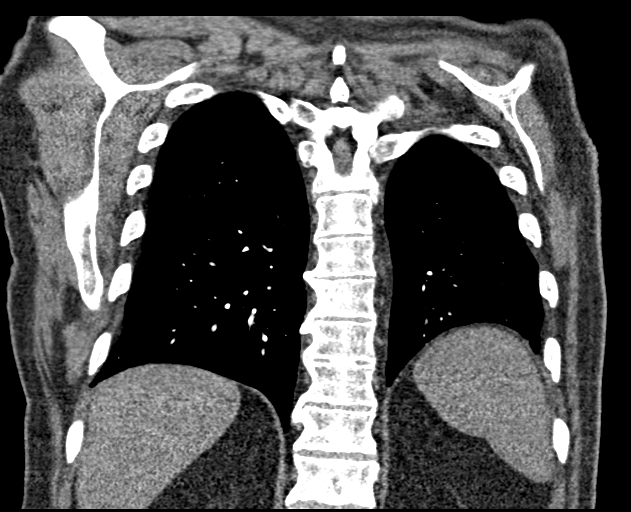

[18 of 46 positions shown; findings below may reference images not displayed]

FINDINGS: Cardiovascular: No filling defects in the pulmonary arteries to
suggest pulmonary emboli. Heavily calcified aorta. Prior CABG. Heart
is normal size. Aorta normal caliber.

Mediastinum/Nodes: No mediastinal, hilar, or axillary adenopathy.
Trachea and esophagus are unremarkable. Thyroid unremarkable.

Lungs/Pleura: Mild elevation of the left hemidiaphragm. No confluent
airspace opacities or effusions.

Upper Abdomen: Imaging into the upper abdomen shows no acute
findings. Calcifications throughout the pancreas compatible with
chronic pancreatitis. Calcifications in the spleen compatible with
old granulomatous disease.

Musculoskeletal: Chest wall soft tissues are unremarkable. No acute
bony abnormality.

Review of the MIP images confirms the above findings.
IMPRESSION: No evidence of pulmonary embolus.

No acute cardiopulmonary disease.

Changes of chronic pancreatitis.

Prior CABG.

Aortic Atherosclerosis ([id number]).

## 2021-09-04 IMAGING — CR DG CHEST 2V
2 series · 2 of 2 positions shown · non-contrast
Comparison: December 16, 2018

CLINICAL DATA: Chest pain

EXAM:
CHEST - 2 VIEW

[chest pa]
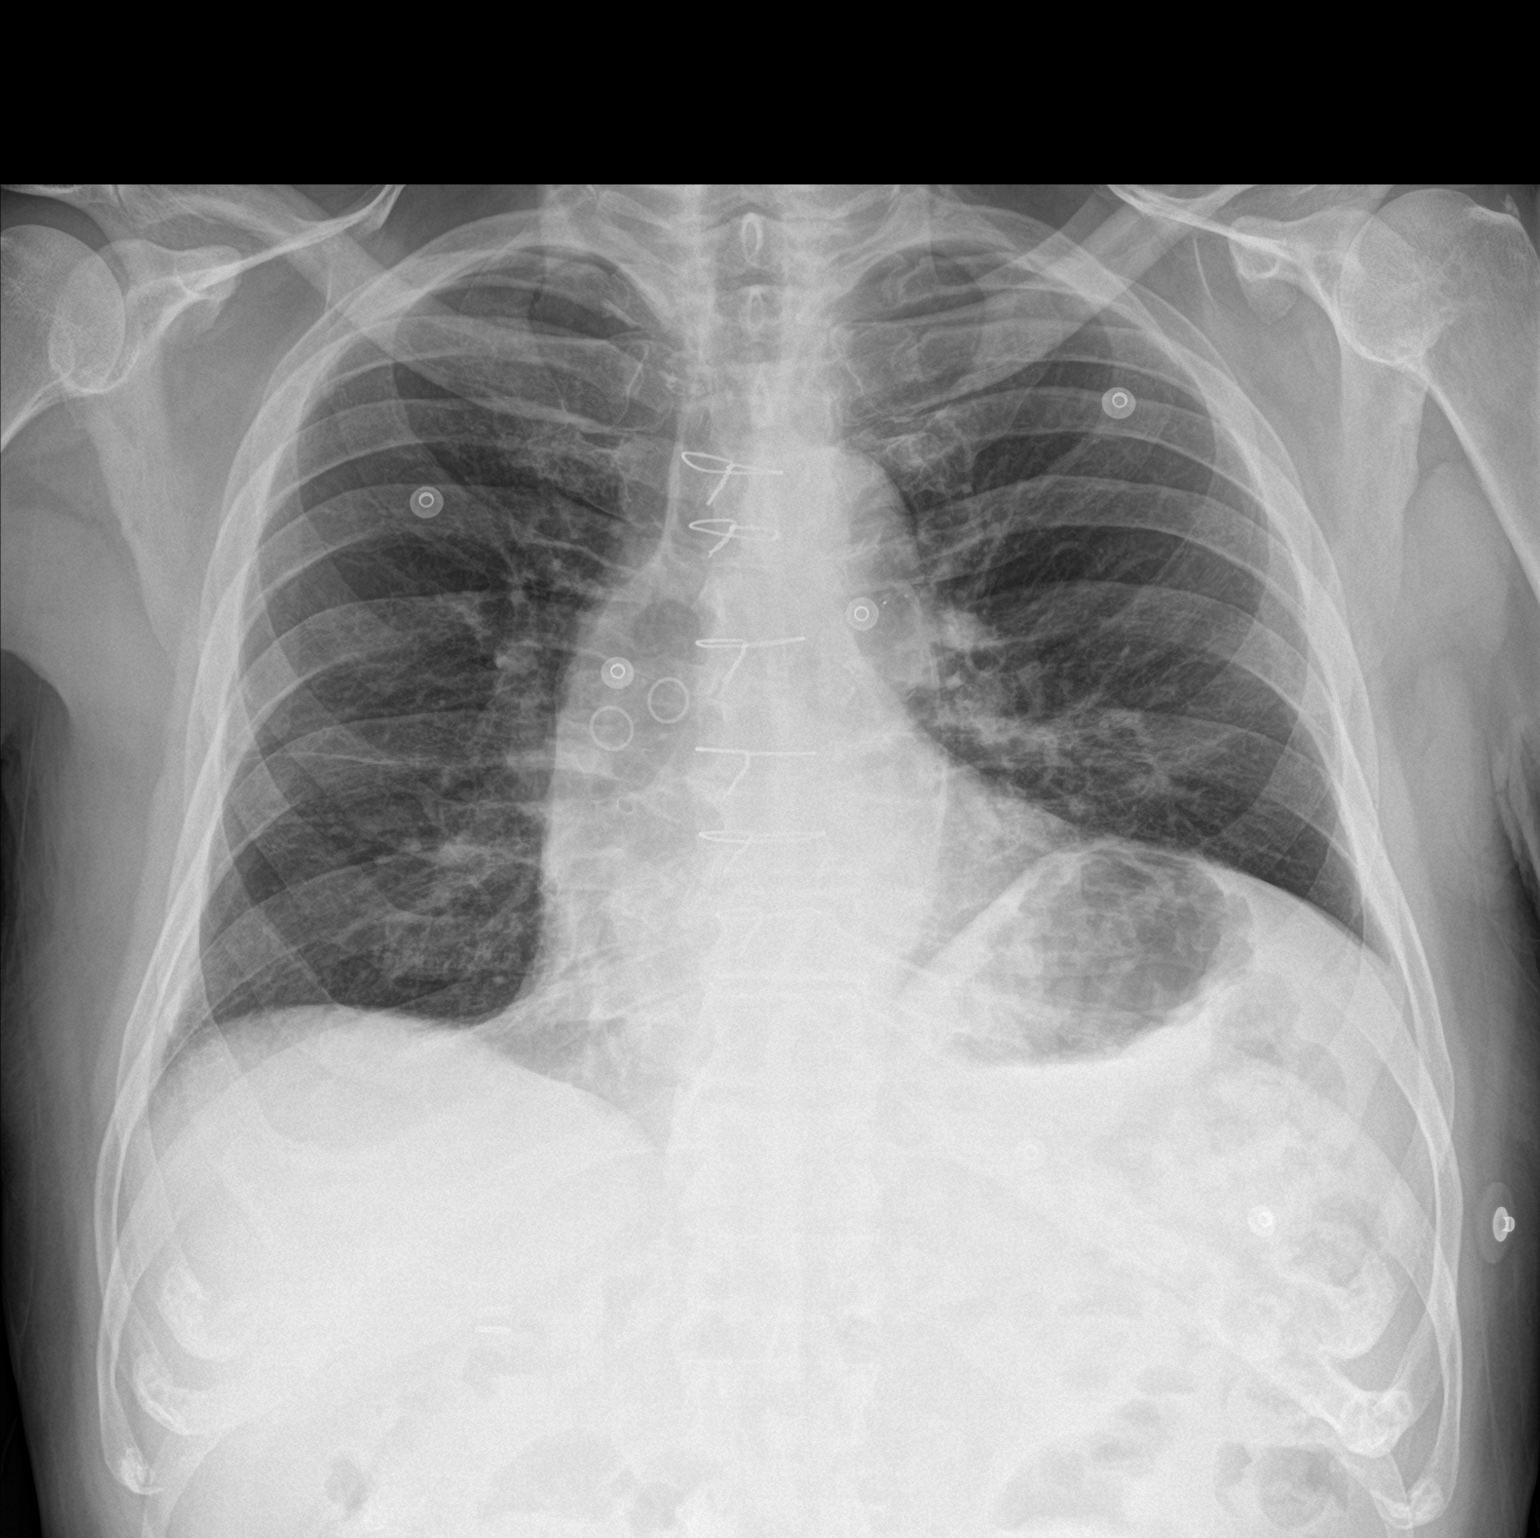

[chest lat]
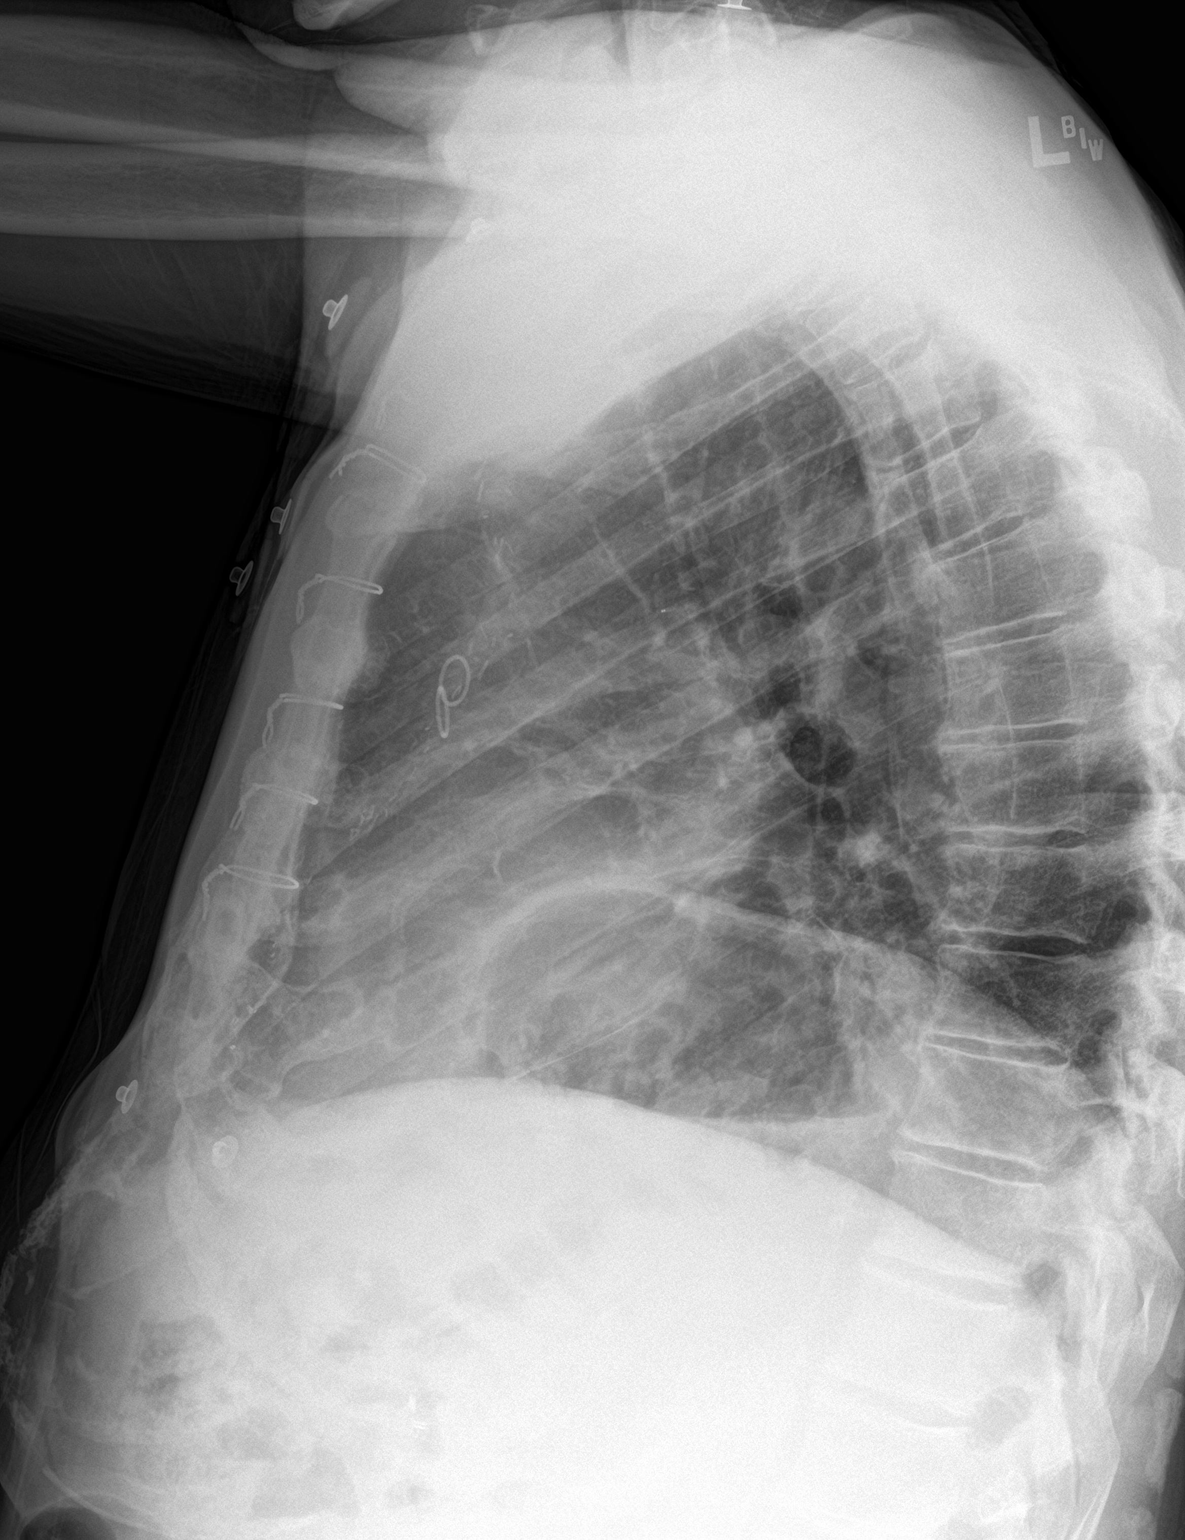

[2 of 2 positions shown; findings below may reference images not displayed]

FINDINGS: The heart size and mediastinal contours are within normal limits.
Both lungs are clear. There is stable elevation of the left
hemidiaphragm. Overlying median sternotomy wires are present. Aortic
knob calcifications are seen. No acute osseous abnormality.
IMPRESSION: No active cardiopulmonary disease.

## 2021-09-07 DIAGNOSIS — M6289 Other specified disorders of muscle: Secondary | ICD-10-CM | POA: Diagnosis not present

## 2021-09-07 DIAGNOSIS — R159 Full incontinence of feces: Secondary | ICD-10-CM | POA: Diagnosis not present

## 2021-09-07 DIAGNOSIS — N3946 Mixed incontinence: Secondary | ICD-10-CM | POA: Diagnosis not present

## 2021-09-15 DIAGNOSIS — M6289 Other specified disorders of muscle: Secondary | ICD-10-CM | POA: Diagnosis not present

## 2021-09-15 DIAGNOSIS — N3946 Mixed incontinence: Secondary | ICD-10-CM | POA: Diagnosis not present

## 2021-09-15 DIAGNOSIS — R159 Full incontinence of feces: Secondary | ICD-10-CM | POA: Diagnosis not present

## 2021-09-21 ENCOUNTER — Ambulatory Visit: Payer: Medicare PPO | Admitting: Nurse Practitioner

## 2021-09-21 ENCOUNTER — Other Ambulatory Visit: Payer: Self-pay

## 2021-09-21 ENCOUNTER — Encounter: Payer: Self-pay | Admitting: Nurse Practitioner

## 2021-09-21 VITALS — BP 130/60 | HR 68 | Ht 70.0 in | Wt 186.0 lb

## 2021-09-21 DIAGNOSIS — I5189 Other ill-defined heart diseases: Secondary | ICD-10-CM

## 2021-09-21 DIAGNOSIS — M1990 Unspecified osteoarthritis, unspecified site: Secondary | ICD-10-CM | POA: Diagnosis not present

## 2021-09-21 DIAGNOSIS — I739 Peripheral vascular disease, unspecified: Secondary | ICD-10-CM | POA: Diagnosis not present

## 2021-09-21 DIAGNOSIS — Z9181 History of falling: Secondary | ICD-10-CM | POA: Diagnosis not present

## 2021-09-21 DIAGNOSIS — I2510 Atherosclerotic heart disease of native coronary artery without angina pectoris: Secondary | ICD-10-CM | POA: Diagnosis not present

## 2021-09-21 DIAGNOSIS — I471 Supraventricular tachycardia: Secondary | ICD-10-CM | POA: Diagnosis not present

## 2021-09-21 DIAGNOSIS — E119 Type 2 diabetes mellitus without complications: Secondary | ICD-10-CM | POA: Diagnosis not present

## 2021-09-21 DIAGNOSIS — E785 Hyperlipidemia, unspecified: Secondary | ICD-10-CM | POA: Diagnosis not present

## 2021-09-21 DIAGNOSIS — I4719 Other supraventricular tachycardia: Secondary | ICD-10-CM

## 2021-09-21 DIAGNOSIS — I10 Essential (primary) hypertension: Secondary | ICD-10-CM

## 2021-09-21 NOTE — Progress Notes (Signed)
? ? ?Office Visit  ?  ?Patient Name: Alexander Choi ?Date of Encounter: 09/21/2021 ? ?Primary Care Provider:  Nicoletta Dress, MD ?Primary Cardiologist:  Peter [name], MD ? ?Chief Complaint  ?  ?88 year old male with a history of CAD s/p CABG x3 in 2006, PAD, paroxymal atrial tachycardia, hypertension, hyperlipidemia, type 2 diabetes, GERD, arthritis, prostate cancer, and IBS who presents for follow-up related to CAD. ? ?Past Medical History  ?  ?Past Medical History:  ?Diagnosis Date  ? Cancer [facility])   ? Chronic kidney disease   ? kidney stones  ? Coronary artery disease   ? with CABG in 2006 with LIMA to LAD, SVG to mid and distal OM, SVG to AM. Normal Myoview in May of 2013  ? Diabetes mellitus   ? Type 2  ? GERD (gastroesophageal reflux disease)   ? Hematuria   ? Hypercholesterolemia   ? Hypertension   ? Irritable bowel syndrome   ? Loose stools   ? Prostate cancer ([place])   ? Urinary obstruction   ? ?Past Surgical History:  ?Procedure Laterality Date  ? CHOLECYSTECTOMY    ? CORONARY ARTERY BYPASS GRAFT  2006  ? LEFT HEART CATH AND CORS/GRAFTS ANGIOGRAPHY N/A 04/09/2021  ? Procedure: LEFT HEART CATH AND CORS/GRAFTS ANGIOGRAPHY;  Surgeon: [name], Peter M, MD;  Location: [place] [facility];  Service: Cardiovascular;  Laterality: N/A;  ? LEFT HEART CATHETERIZATION WITH CORONARY/GRAFT ANGIOGRAM N/A 04/11/2014  ? Procedure: LEFT HEART CATHETERIZATION WITH [name];  Surgeon: Peter M [name], MD;  Location: [facility] CATH LAB;  Service: Cardiovascular;  Laterality: N/A;  ? OTHER SURGICAL HISTORY    ? bowel obstruction  ? RADIOACTIVE SEED IMPLANT    ? for prostate cancer  ? RIGHT/LEFT HEART CATH AND CORONARY/GRAFT ANGIOGRAPHY N/A 12/12/2019  ? Procedure: RIGHT/LEFT HEART CATH AND CORONARY/GRAFT ANGIOGRAPHY;  Surgeon: [name], Peter M, MD;  Location: [place] [facility];  Service: Cardiovascular;  Laterality: N/A;  ? SKIN CANCER EXCISION    ? TEMPORARY PACEMAKER N/A 12/13/2019  ? Procedure: TEMPORARY PACEMAKER;   Surgeon: [name], Peter M, MD;  Location: [place] [facility];  Service: Cardiovascular;  Laterality: N/A;  ? ? ?Allergies ? ?Allergies  ?Allergen Reactions  ? Ace Inhibitors Other (See Comments)  ?  unknown  ? Nitrates, Organic Swelling  ?  Swelling in hands  ? Shellfish Allergy Nausea Only  ?  Pt states scallops-not all shellfish-GI symptoms/ nausea -50+ years ago  ? Penicillins Rash  ? ? ?History of Present Illness  ?  ?88 year old male with the above past medical history including CAD s/p CABG x3 in 2006, PAD, paroxysmal atrial tachycardia, hypertension, hyperlipidemia, type 2 diabetes, GERD, arthritis, prostate cancer, and IBS. ? ?History of CAD s/p CABG x3 in 2006 (LIMA-LAD,  SVG to OM, SVG to AM).  Myoview in October 2015 showed inferior ischemia, subsequent cath showed all grafts patent with severe disease in the native circumflex that predated his CABG.  Attempted PCI-chronically occluded LCx in 2020, however, this was unsuccessful due to inability to cross the lesion with a wire.  Cardiac monitor in 2021 showed paroxysmal atrial tachycardia. ABIs in April 2022 showed moderate oral lower extremity arterial disease. Lower extremity Dopplers indicated occluded bilateral SFAs, he was referred to Dr. Gwenlyn Found. In the absence of lifestyle limiting claudication, invasive intervention was deferred. Cardiac catheterization in October 2022 in the setting of recurrent chest pain, NSTEMI, showed continued patency of 3 of 3 grafts, severely diseased, calcified and aneurysmal LCx,  no targets for PCI. Continued medical therapy was recommended.  Echo at the time showed EF 60 to 65%, G1 DD, mild to moderate aortic valve sclerosis without evidence of stenosis.  He did not tolerate high-dose Ranexa, intolerant of nitrates due to headache, vomiting, and hand swelling.   ? ?He was last seen in the office on 05/26/2021 and was stable from a cardiac standpoint. He denied symptoms concerning for angina.  He was hospitalized in  December 2022 the setting of fall due to left-sided weakness.  Work-up was negative for stroke. He presents today for follow-up.  Since his last visit he has been stable from a cardiac standpoint.  He states he is unable to walk long distances due to generalized fatigue, mild dyspnea, however, he exercises regularly on a stationary bicycle, and denies symptoms concerning for angina.  Overall, he reports feeling well and denies any specific concerns or complaints today. ? ?Home Medications  ?  ?Current Outpatient Medications  ?Medication Sig Dispense Refill  ? Accu-Chek Softclix Lancets lancets     ? acetaminophen (TYLENOL) 500 MG tablet Take 1,000 mg by mouth every 6 (six) hours as needed for moderate pain or mild pain.    ? albuterol (VENTOLIN HFA) 108 (90 Base) MCG/ACT inhaler Inhale 2 puffs into the lungs every 6 (six) hours as needed for wheezing or shortness of breath. 8 g 2  ? amLODipine (NORVASC) 5 MG tablet Take 1.5 tablets (7.5 mg total) by mouth at bedtime. 135 tablet 3  ? aspirin 81 MG tablet Take 81 mg by mouth daily.    ? atorvastatin (LIPITOR) 80 MG tablet Take 1 tablet (80 mg total) by mouth daily. 90 tablet 3  ? finasteride (PROSCAR) 5 MG tablet Take 5 mg by mouth daily.    ? Fluticasone Propionate 0.05 % LOTN Apply topically once a week.    ? furosemide (LASIX) 40 MG tablet Take 1 tablet (40 mg total) by mouth daily. 90 tablet 3  ? gabapentin (NEURONTIN) 100 MG capsule Take 100 mg by mouth 2 (two) times daily.    ? GEMTESA 75 MG TABS Take 1 tablet by mouth daily as needed.    ? glimepiride (AMARYL) 4 MG tablet Take 4 mg by mouth daily with breakfast.    ? glucose blood (ACCU-CHEK AVIVA PLUS) test strip     ? Ketotifen Fumarate (ITCHY EYE DROPS OP) Apply 2 drops to eye daily as needed (itchyness).    ? levothyroxine (SYNTHROID) 100 MCG tablet Take 1 tablet (100 mcg total) by mouth daily before breakfast.    ? losartan (COZAAR) 100 MG tablet Take 100 mg by mouth daily.    ? meclizine (ANTIVERT) 25 MG  tablet Take 1 tablet (25 mg total) by mouth 3 (three) times daily as needed for dizziness. 30 tablet 0  ? meloxicam (MOBIC) 15 MG tablet Take by mouth daily.    ? metFORMIN (GLUCOPHAGE) 500 MG tablet Take 1 tablet (500 mg total) by mouth 2 (two) times daily with a meal. Resume on 04/13/21.    ? Multiple Vitamins-Minerals (ICAPS AREDS 2 PO) Take 1 Cartridge by mouth in the morning and at bedtime.    ? mupirocin ointment (BACTROBAN) 2 % Apply topically as needed for rash.    ? nitroGLYCERIN (NITROSTAT) 0.4 MG SL tablet Place 1 tablet (0.4 mg total) under the tongue every 5 (five) minutes as needed for chest pain. 90 tablet 3  ? ondansetron (ZOFRAN) 4 MG tablet Take 1 tablet (4 mg total)  by mouth every 8 (eight) hours as needed for nausea or vomiting. 20 tablet 0  ? ranolazine (RANEXA) 500 MG 12 hr tablet Take 1 tablet (500 mg total) by mouth 2 (two) times daily. 180 tablet 3  ? vitamin B-12 (CYANOCOBALAMIN) 1000 MCG tablet Take 1 tablet (1,000 mcg total) by mouth daily.    ? pantoprazole (PROTONIX) 40 MG tablet Take 1 tablet (40 mg total) by mouth daily. 30 tablet 0  ? ?No current facility-administered medications for this visit.  ?  ? ?Review of Systems  ?  ?He denies chest pain, palpitations, dyspnea, pnd, orthopnea, n, v, dizziness, syncope, edema, weight gain, or early satiety. All other systems reviewed and are otherwise negative except as noted above.  ? ?Physical Exam  ?  ?VS:  BP 130/60   Pulse 68   Ht '5\' 10"'$  (1.778 m)   Wt 186 lb (84.4 kg)   SpO2 98%   BMI 26.69 kg/m?  ?GEN: Well nourished, well developed, in no acute distress. ?HEENT: normal. ?Neck: Supple, no JVD, carotid bruits, or masses. ?Cardiac: RRR, no murmurs, rubs, or gallops. No clubbing, cyanosis, edema.  Radials 2+/DP/PT 1+ and equal bilaterally.  ?Respiratory:  Respirations regular and unlabored, clear to auscultation bilaterally. ?GI: Soft, nontender, nondistended, BS + x 4. ?MS: no deformity or atrophy. ?Skin: warm and dry, no  rash. ?Neuro:  Strength and sensation are intact. ?Psych: Normal affect. ? ?Accessory Clinical Findings  ?  ?ECG personally reviewed by me today -no EKG in office today. ? ?Lab Results  ?Component Value Date  ? WBC 5.4 12/11/

## 2021-09-21 NOTE — Patient Instructions (Signed)
Medication Instructions:  ?Your physician recommends that you continue on your current medications as directed. Please refer to the Current Medication list given to you today. ? ?*If you need a refill on your cardiac medications before your next appointment, please call your pharmacy* ? ? ?Lab Work: ?NONE ordered at this time of appointment   ? ? ?Testing/Procedures: ?NONE ordered at this time of appointment  ? ? ?Follow-Up: ?At [facility], you and your health needs are our priority.  As part of our continuing mission to provide you with exceptional heart care, we have created designated Provider Care Teams.  These Care Teams include your primary Cardiologist (physician) and Advanced Practice Providers (APPs -  Physician Assistants and Nurse Practitioners) who all work together to provide you with the care you need, when you need it. ? ?We recommend signing up for the patient portal called "MyChart".  Sign up information is provided on this After Visit Summary.  MyChart is used to connect with patients for Virtual Visits (Telemedicine).  Patients are able to view lab/test results, encounter notes, upcoming appointments, etc.  Non-urgent messages can be sent to your provider as well.   ?To learn more about what you can do with MyChart, go to [email or web address].   ? ?Your next appointment:   ?3-4 Gwenlyn Found), 6 ([name]) month(s) ? ?The format for your next appointment:   ?In Person ? ?Provider:   ?Quay Burow MD, Peter [name], MD   ? ? ?If primary card or EP is not listed click here to update    :1}  ? ? ? ? ?

## 2021-09-22 ENCOUNTER — Ambulatory Visit: Payer: Medicare PPO | Admitting: Physician Assistant

## 2021-09-22 DIAGNOSIS — M14671 Charcot's joint, right ankle and foot: Secondary | ICD-10-CM | POA: Diagnosis not present

## 2021-09-22 DIAGNOSIS — E1142 Type 2 diabetes mellitus with diabetic polyneuropathy: Secondary | ICD-10-CM | POA: Diagnosis not present

## 2021-09-22 DIAGNOSIS — L84 Corns and callosities: Secondary | ICD-10-CM | POA: Diagnosis not present

## 2021-09-22 DIAGNOSIS — Z89421 Acquired absence of other right toe(s): Secondary | ICD-10-CM | POA: Diagnosis not present

## 2021-09-22 DIAGNOSIS — Z7984 Long term (current) use of oral hypoglycemic drugs: Secondary | ICD-10-CM | POA: Diagnosis not present

## 2021-09-22 DIAGNOSIS — M14672 Charcot's joint, left ankle and foot: Secondary | ICD-10-CM | POA: Diagnosis not present

## 2021-09-23 DIAGNOSIS — R159 Full incontinence of feces: Secondary | ICD-10-CM | POA: Diagnosis not present

## 2021-09-23 DIAGNOSIS — M6289 Other specified disorders of muscle: Secondary | ICD-10-CM | POA: Diagnosis not present

## 2021-09-23 DIAGNOSIS — N3946 Mixed incontinence: Secondary | ICD-10-CM | POA: Diagnosis not present

## 2021-09-27 DIAGNOSIS — M1712 Unilateral primary osteoarthritis, left knee: Secondary | ICD-10-CM | POA: Diagnosis not present

## 2021-09-29 DIAGNOSIS — M6289 Other specified disorders of muscle: Secondary | ICD-10-CM | POA: Diagnosis not present

## 2021-09-29 DIAGNOSIS — N3946 Mixed incontinence: Secondary | ICD-10-CM | POA: Diagnosis not present

## 2021-09-29 DIAGNOSIS — R159 Full incontinence of feces: Secondary | ICD-10-CM | POA: Diagnosis not present

## 2021-10-04 ENCOUNTER — Other Ambulatory Visit: Payer: Self-pay

## 2021-10-04 MED ORDER — RANOLAZINE ER 500 MG PO TB12: 500.0000 mg | ORAL_TABLET | Freq: Two times a day (BID) | ORAL | 3 refills | Status: DC

## 2021-10-06 DIAGNOSIS — R159 Full incontinence of feces: Secondary | ICD-10-CM | POA: Diagnosis not present

## 2021-10-06 DIAGNOSIS — M6289 Other specified disorders of muscle: Secondary | ICD-10-CM | POA: Diagnosis not present

## 2021-10-06 DIAGNOSIS — N3946 Mixed incontinence: Secondary | ICD-10-CM | POA: Diagnosis not present

## 2021-10-13 ENCOUNTER — Telehealth: Payer: Self-pay | Admitting: Cardiology

## 2021-10-13 DIAGNOSIS — M6289 Other specified disorders of muscle: Secondary | ICD-10-CM | POA: Diagnosis not present

## 2021-10-13 DIAGNOSIS — N3946 Mixed incontinence: Secondary | ICD-10-CM | POA: Diagnosis not present

## 2021-10-13 DIAGNOSIS — R159 Full incontinence of feces: Secondary | ICD-10-CM | POA: Diagnosis not present

## 2021-10-13 NOTE — Telephone Encounter (Signed)
? ?*  STAT* If patient is at the pharmacy, call can be transferred to refill team. ? ? ?1. Which medications need to be refilled? (please list name of each medication and dose if known)  ? ?furosemide (LASIX) 40 MG tablet ? ?2. Which pharmacy/location (including street and city if local pharmacy) is medication to be sent to? ? ?New Pharmacy Alert: ?[place] ? ?3. Do they need a 30 day or 90 day supply? 90 day  ?

## 2021-10-14 ENCOUNTER — Other Ambulatory Visit: Payer: Self-pay

## 2021-10-14 MED ORDER — FUROSEMIDE 40 MG PO TABS: 40.0000 mg | ORAL_TABLET | Freq: Every day | ORAL | 3 refills | Status: DC

## 2021-10-14 NOTE — Telephone Encounter (Signed)
Called patient regarding medication refill. Unable to leave message to advise refill sent to pharmacy. ?

## 2021-10-18 ENCOUNTER — Telehealth: Payer: Self-pay | Admitting: Neurology

## 2021-10-18 ENCOUNTER — Ambulatory Visit: Payer: Medicare PPO | Admitting: Neurology

## 2021-10-18 ENCOUNTER — Encounter: Payer: Self-pay | Admitting: Neurology

## 2021-10-18 VITALS — BP 126/66 | HR 75 | Ht 70.0 in | Wt 179.5 lb

## 2021-10-18 DIAGNOSIS — G6289 Other specified polyneuropathies: Secondary | ICD-10-CM | POA: Diagnosis not present

## 2021-10-18 DIAGNOSIS — R269 Unspecified abnormalities of gait and mobility: Secondary | ICD-10-CM | POA: Diagnosis not present

## 2021-10-18 NOTE — Progress Notes (Signed)
? ?Chief Complaint  ?Patient presents with  ? Follow-up  ?  Rm 15. Alone. ?Denies any new episodes of syncope. Pt reports one fall since last visit.  ? ? ? ? ?ASSESSMENT AND PLAN ? ?Alexander Choi is a 88 y.o. male   ?Peripheral neuropathy ?Syncope ? EMG nerve conduction study on August 11, 2021   evidence of peripheral neuropathy, with mixed axonal and demyelinating features, as evidenced by prolonged F-wave latency at bilateral ulnar motor responses, there was only mild active denervation at distal leg muscles ? ? Less likely to be explained by his diabetes, which has always been under good control, A1c was less than 7, ? He does have bilateral foot drop, areflexia, positive Romberg signs, right first dorsal interossei muscle atrophy, weakness, also developed urinary urgency, occasionally incontinence, ? Laboratory evaluation showed no treatable etiology ? MRI of cervical spine, lumbar spine to rule out right cervical radiculopathy, lumbar radiculopathy ?May consider lumbar puncture, if there is elevated total protein will consider IVIG treatment, ?Continue physical therapy, advised patient to bring his family for next visit to discuss treatment plan ?  ? ? ?DIAGNOSTIC DATA (LABS, IMAGING, TESTING) ?- I reviewed patient records, labs, notes, testing and imaging myself where available. ? ? ?MEDICAL HISTORY: ? ?Alexander Choi is a 88 year old male, seen in request by his primary care physician Dr. Nicoletta Dress, for evaluation of fall, following up for hospital discharge on June 07, 2021, ? ?I reviewed and summarized the referring note. PMHX. ?HLD ?DM  ?CKD ?CAD ?Prostate Cancer ? ?Patient is hard of hearing, patient uses walker at home, I reviewed hospital record on December 9-12 2022, after napping in his recliner for few hours, while trying to get up, he felt lightheaded, his l legs gave out underneath him, he fell, could not get up, no loss of consciousness, EMS was called, was reported that he was  confused, ? ?I personally reviewed CT head, MRI of the brain without contrast on June 04, 2021: No acute abnormality, no evidence of stroke, mild small vessel disease ? ?CT angiogram of head and neck showed no large vessel disease ?Laboratory evaluation showed A1c 7.0, CBC showed hemoglobin of 9.5, normal BMP, creatinine of 0.86, negative troponin, normal TSH, ammonia, ? 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 ?Today also demonstrate orthostatic hypotension, tachycardia, sitting down blood pressure 108/59, heart rate of 73, standing up 94/53, heart rate of 92, standing up for 1 minute, 118/58 heart rate of 77 ? ?Update August 11, 2021: ?Laboratory evaluations in December 2022 showed elevated A1c 7.0, normal TSH, reviewed multiple A1c's, all is in less than 7 level,  ?Patient return for electrodiagnostic study today, which showed evidence of peripheral neuropathy, with mixed demyelinating and mild axonal component.  As evident by prolonged bilateral ulnar F-wave latency, with only mild active denervation at distal left muscles, this would not explain explained by his well-controlled diabetes, A1c was always under 7 ? ?On examination he has distal weakness, bilateral foot drop, positive Romberg signs, areflexia, also have urinary urgency, occasionally incontinence, ? ?UPDATE October 18 2021: ?He lives by himself, has two sons live close to him, did not have MRI cervical and lumbar  ?I failed to contact his 2 sons by calling listed phone numbers ?Laboratory evaluation showed normal ANA, RPR, ESR C-reactive protein, normal protein electrophoresis ? ? ? ?PHYSICAL EXAM: ?  ?Vitals:  ? 10/18/21 1505  ?  BP: 126/66  ?Pulse: 75  ?Weight: 179 lb 8 oz (81.4 kg)  ?Height: _0  (1.778 m)  ? ? ?Not recorded ?  ? ? ?Body mass index is 25.76 kg/m?. ? ?PHYSICAL EXAMNIATION: ? ?Gen: NAD, conversant, well nourised, well groomed                      ?Cardiovascular: Regular rate rhythm, no peripheral edema, warm, nontender. ?Eyes: Conjunctivae clear without exudates or hemorrhage ?Neck: Supple, no carotid bruits. ?Pulmonary: Clear to auscultation bilaterally  ? ?NEUROLOGICAL EXAM: ? ?MENTAL STATUS: ?Speech/cognition: ?Hard of hearing, awake, alert, oriented to history taking and casual conversation ?  ?CRANIAL NERVES: ?CN II: Visual fields are full to confrontation. Pupils are round equal and briskly reactive to light. ?CN III, IV, VI: extraocular movement are normal. No ptosis. ?CN V: Facial sensation is intact to light touch ?CN VII: Face is symmetric with normal eye closure  ?CN VIII: Hard of hearing, wearing hearing aid ?CN IX, X: Phonation is normal. ?CN XI: Head turning and shoulder shrug are intact ? ?MOTOR: Limitation of left upper extremity examination due to left rotator cuff, left shoulder pain, right first dorsal interossei muscle mild atrophy, mild right finger abduction, bilateral handgrip weakness. ? ?mild bilateral hip flexion weakness, status post a right fourth and fifth toe amputation, mild to moderate right ankle dorsiflexion weakness. ? ?REFLEXES: Areflexia, plantar responses were mute ? ?SENSORY: Length dependent decreased light touch, pinprick, vibratory sensation to knee level, absent toe proprioception ? ?COORDINATION: ?There is no trunk or limb dysmetria noted. ? ?GAIT/STANCE: He needs push-up to get up from seated position, wide-based, unsteady, bilateral foot drop, flatfoot, positive Romberg signs ? ?REVIEW OF SYSTEMS:  ?Full 14 system review of systems performed and notable only for as above ?All other review of systems were negative. ? ? ?ALLERGIES: ?Allergies  ?Allergen Reactions  ? Ace Inhibitors Other (See Comments)  ?  unknown  ? Nitrates, Organic Swelling  ?  Swelling in hands  ? Shellfish Allergy Nausea Only  ?  Pt states scallops-not all shellfish-GI symptoms/ nausea -50+ years ago  ? Penicillins Rash  ? ? ?HOME  MEDICATIONS: ?Current Outpatient Medications  ?Medication Sig Dispense Refill  ? Accu-Chek Softclix Lancets lancets     ? acetaminophen (TYLENOL) 500 MG tablet Take 1,000 mg by mouth every 6 (six) hours as needed for moderate pain or mild pain.    ? albuterol (VENTOLIN HFA) 108 (90 Base) MCG/ACT inhaler Inhale 2 puffs into the lungs every 6 (six) hours as needed for wheezing or shortness of breath. 8 g 2  ? amLODipine (NORVASC) 5 MG tablet Take 1.5 tablets (7.5 mg total) by mouth at bedtime. 135 tablet 3  ? aspirin 81 MG tablet Take 81 mg by mouth daily.    ? atorvastatin (LIPITOR) 80 MG tablet Take 1 tablet (80 mg total) by mouth daily. 90 tablet 3  ? famotidine (PEPCID) 40 MG tablet Take 40 mg by mouth daily.    ? finasteride (PROSCAR) 5 MG tablet Take 5 mg by mouth daily.    ? Fluticasone Propionate 0.05 % LOTN Apply topically once a week.    ? furosemide (LASIX) 40 MG tablet Take 40 mg by mouth daily.    ? gabapentin (NEURONTIN) 100 MG capsule Take 100 mg by mouth 2 (two) times daily.    ? GEMTESA 75 MG TABS Take 1 tablet by mouth daily as needed.    ? glimepiride (AMARYL) 2 MG tablet  Take 4 mg by mouth daily with breakfast.    ? glucose blood (ACCU-CHEK AVIVA PLUS) test strip     ? Ketotifen Fumarate (ITCHY EYE DROPS OP) Apply 2 drops to eye daily as needed (itchyness).    ? levothyroxine (SYNTHROID) 100 MCG tablet Take 1 tablet (100 mcg total) by mouth daily before breakfast.    ? losartan (COZAAR) 100 MG tablet Take 100 mg by mouth daily.    ? Magnesium 200 MG CHEW Chew by mouth.    ? meclizine (ANTIVERT) 25 MG tablet Take 1 tablet (25 mg total) by mouth 3 (three) times daily as needed for dizziness. 30 tablet 0  ? meloxicam (MOBIC) 15 MG tablet Take by mouth daily.    ? metFORMIN (GLUCOPHAGE) 500 MG tablet Take 1 tablet (500 mg total) by mouth 2 (two) times daily with a meal. Resume on 04/13/21.    ? Multiple Vitamins-Minerals (ICAPS AREDS 2 PO) Take 1 Cartridge by mouth in the morning and at bedtime.    ?  mupirocin ointment (BACTROBAN) 2 % Apply topically as needed for rash.    ? nitroGLYCERIN (NITROSTAT) 0.4 MG SL tablet Place 1 tablet (0.4 mg total) under the tongue every 5 (five) minutes as needed for

## 2021-10-18 NOTE — Telephone Encounter (Signed)
Patient reported was not contacted for MRIs. Please check on this. ?

## 2021-10-22 DIAGNOSIS — E785 Hyperlipidemia, unspecified: Secondary | ICD-10-CM | POA: Diagnosis not present

## 2021-10-22 DIAGNOSIS — G4762 Sleep related leg cramps: Secondary | ICD-10-CM | POA: Diagnosis not present

## 2021-10-22 DIAGNOSIS — E1165 Type 2 diabetes mellitus with hyperglycemia: Secondary | ICD-10-CM | POA: Diagnosis not present

## 2021-10-22 DIAGNOSIS — E11621 Type 2 diabetes mellitus with foot ulcer: Secondary | ICD-10-CM | POA: Diagnosis not present

## 2021-10-22 DIAGNOSIS — M14672 Charcot's joint, left ankle and foot: Secondary | ICD-10-CM | POA: Diagnosis not present

## 2021-10-22 DIAGNOSIS — D638 Anemia in other chronic diseases classified elsewhere: Secondary | ICD-10-CM | POA: Diagnosis not present

## 2021-10-22 DIAGNOSIS — E039 Hypothyroidism, unspecified: Secondary | ICD-10-CM | POA: Diagnosis not present

## 2021-10-22 DIAGNOSIS — L97412 Non-pressure chronic ulcer of right heel and midfoot with fat layer exposed: Secondary | ICD-10-CM | POA: Diagnosis not present

## 2021-10-22 DIAGNOSIS — M14671 Charcot's joint, right ankle and foot: Secondary | ICD-10-CM | POA: Diagnosis not present

## 2021-10-22 DIAGNOSIS — I10 Essential (primary) hypertension: Secondary | ICD-10-CM | POA: Diagnosis not present

## 2021-10-22 DIAGNOSIS — I25810 Atherosclerosis of coronary artery bypass graft(s) without angina pectoris: Secondary | ICD-10-CM | POA: Diagnosis not present

## 2021-10-22 DIAGNOSIS — Z7984 Long term (current) use of oral hypoglycemic drugs: Secondary | ICD-10-CM | POA: Diagnosis not present

## 2021-10-22 DIAGNOSIS — E1140 Type 2 diabetes mellitus with diabetic neuropathy, unspecified: Secondary | ICD-10-CM | POA: Diagnosis not present

## 2021-10-25 DIAGNOSIS — M6289 Other specified disorders of muscle: Secondary | ICD-10-CM | POA: Diagnosis not present

## 2021-10-25 DIAGNOSIS — N3946 Mixed incontinence: Secondary | ICD-10-CM | POA: Diagnosis not present

## 2021-10-25 DIAGNOSIS — R159 Full incontinence of feces: Secondary | ICD-10-CM | POA: Diagnosis not present

## 2021-10-26 NOTE — Telephone Encounter (Signed)
10/26/21 updated [employer] Josem Kaufmann: [id number] (exp. 10/26/21 to 11/25/21) order sent to [facility]  ? ?08-19-2021 2ND ATTEMPT EPIC ORDER LMOM/DP ? ?08/17/19 1st attempt [facility] EPIC [employer] ?

## 2021-10-27 DIAGNOSIS — M1712 Unilateral primary osteoarthritis, left knee: Secondary | ICD-10-CM | POA: Diagnosis not present

## 2021-10-27 DIAGNOSIS — M25562 Pain in left knee: Secondary | ICD-10-CM | POA: Diagnosis not present

## 2021-11-03 DIAGNOSIS — M25562 Pain in left knee: Secondary | ICD-10-CM | POA: Diagnosis not present

## 2021-11-03 DIAGNOSIS — M1712 Unilateral primary osteoarthritis, left knee: Secondary | ICD-10-CM | POA: Diagnosis not present

## 2021-11-05 DIAGNOSIS — Z7984 Long term (current) use of oral hypoglycemic drugs: Secondary | ICD-10-CM | POA: Diagnosis not present

## 2021-11-05 DIAGNOSIS — L97519 Non-pressure chronic ulcer of other part of right foot with unspecified severity: Secondary | ICD-10-CM | POA: Diagnosis not present

## 2021-11-05 DIAGNOSIS — M14671 Charcot's joint, right ankle and foot: Secondary | ICD-10-CM | POA: Diagnosis not present

## 2021-11-05 DIAGNOSIS — E11621 Type 2 diabetes mellitus with foot ulcer: Secondary | ICD-10-CM | POA: Diagnosis not present

## 2021-11-10 DIAGNOSIS — M25562 Pain in left knee: Secondary | ICD-10-CM | POA: Diagnosis not present

## 2021-11-10 DIAGNOSIS — M1712 Unilateral primary osteoarthritis, left knee: Secondary | ICD-10-CM | POA: Diagnosis not present

## 2021-11-11 DIAGNOSIS — R159 Full incontinence of feces: Secondary | ICD-10-CM | POA: Diagnosis not present

## 2021-11-11 DIAGNOSIS — M6289 Other specified disorders of muscle: Secondary | ICD-10-CM | POA: Diagnosis not present

## 2021-11-11 DIAGNOSIS — N3946 Mixed incontinence: Secondary | ICD-10-CM | POA: Diagnosis not present

## 2021-11-15 DIAGNOSIS — M6289 Other specified disorders of muscle: Secondary | ICD-10-CM | POA: Diagnosis not present

## 2021-11-15 DIAGNOSIS — N3946 Mixed incontinence: Secondary | ICD-10-CM | POA: Diagnosis not present

## 2021-11-15 DIAGNOSIS — R159 Full incontinence of feces: Secondary | ICD-10-CM | POA: Diagnosis not present

## 2021-11-16 DIAGNOSIS — N3946 Mixed incontinence: Secondary | ICD-10-CM | POA: Diagnosis not present

## 2021-11-17 DIAGNOSIS — M1712 Unilateral primary osteoarthritis, left knee: Secondary | ICD-10-CM | POA: Diagnosis not present

## 2021-11-25 DIAGNOSIS — R159 Full incontinence of feces: Secondary | ICD-10-CM | POA: Diagnosis not present

## 2021-11-25 DIAGNOSIS — M6289 Other specified disorders of muscle: Secondary | ICD-10-CM | POA: Diagnosis not present

## 2021-11-25 DIAGNOSIS — N3946 Mixed incontinence: Secondary | ICD-10-CM | POA: Diagnosis not present

## 2021-12-01 DIAGNOSIS — M14671 Charcot's joint, right ankle and foot: Secondary | ICD-10-CM | POA: Diagnosis not present

## 2021-12-01 DIAGNOSIS — E11621 Type 2 diabetes mellitus with foot ulcer: Secondary | ICD-10-CM | POA: Diagnosis not present

## 2021-12-01 DIAGNOSIS — L97519 Non-pressure chronic ulcer of other part of right foot with unspecified severity: Secondary | ICD-10-CM | POA: Diagnosis not present

## 2021-12-01 DIAGNOSIS — Z7984 Long term (current) use of oral hypoglycemic drugs: Secondary | ICD-10-CM | POA: Diagnosis not present

## 2021-12-01 DIAGNOSIS — M14672 Charcot's joint, left ankle and foot: Secondary | ICD-10-CM | POA: Diagnosis not present

## 2021-12-03 DIAGNOSIS — S0083XA Contusion of other part of head, initial encounter: Secondary | ICD-10-CM | POA: Diagnosis not present

## 2021-12-03 DIAGNOSIS — S51012A Laceration without foreign body of left elbow, initial encounter: Secondary | ICD-10-CM | POA: Diagnosis not present

## 2021-12-07 DIAGNOSIS — M6289 Other specified disorders of muscle: Secondary | ICD-10-CM | POA: Diagnosis not present

## 2021-12-07 DIAGNOSIS — N3946 Mixed incontinence: Secondary | ICD-10-CM | POA: Diagnosis not present

## 2021-12-07 DIAGNOSIS — R159 Full incontinence of feces: Secondary | ICD-10-CM | POA: Diagnosis not present

## 2021-12-08 ENCOUNTER — Ambulatory Visit
Admission: RE | Admit: 2021-12-08 | Discharge: 2021-12-08 | Disposition: A | Discharge status: Home / Self Care | Payer: Medicare PPO | Source: Ambulatory Visit | Attending: Neurology | Admitting: Neurology

## 2021-12-08 DIAGNOSIS — G6289 Other specified polyneuropathies: Secondary | ICD-10-CM | POA: Diagnosis not present

## 2021-12-08 DIAGNOSIS — R269 Unspecified abnormalities of gait and mobility: Secondary | ICD-10-CM

## 2021-12-08 DIAGNOSIS — R55 Syncope and collapse: Secondary | ICD-10-CM | POA: Diagnosis not present

## 2021-12-14 DIAGNOSIS — M6289 Other specified disorders of muscle: Secondary | ICD-10-CM | POA: Diagnosis not present

## 2021-12-14 DIAGNOSIS — N3946 Mixed incontinence: Secondary | ICD-10-CM | POA: Diagnosis not present

## 2021-12-14 DIAGNOSIS — R159 Full incontinence of feces: Secondary | ICD-10-CM | POA: Diagnosis not present

## 2021-12-15 DIAGNOSIS — E11621 Type 2 diabetes mellitus with foot ulcer: Secondary | ICD-10-CM | POA: Diagnosis not present

## 2021-12-15 DIAGNOSIS — Z7984 Long term (current) use of oral hypoglycemic drugs: Secondary | ICD-10-CM | POA: Diagnosis not present

## 2021-12-15 DIAGNOSIS — L97519 Non-pressure chronic ulcer of other part of right foot with unspecified severity: Secondary | ICD-10-CM | POA: Diagnosis not present

## 2021-12-15 DIAGNOSIS — M14671 Charcot's joint, right ankle and foot: Secondary | ICD-10-CM | POA: Diagnosis not present

## 2021-12-16 ENCOUNTER — Telehealth: Payer: Self-pay | Admitting: Neurology

## 2021-12-16 NOTE — Telephone Encounter (Signed)
I spoke to the patient and provided the MRI results below.

## 2021-12-16 NOTE — Telephone Encounter (Signed)
Please call patient, MRI of cervical spine showed mild degenerative changes there is no evidence of spinal cord compression, mild foraminal stenosis at C 4 5,  MRI of lumbar showed degenerative changes, mild foraminal stenosis L3-4, 45 no evidence of spinal cord or nerve root compression  MRI cervical spine without contrast demonstrating: - At C4-5 uncovertebral joint and facet hypertrophy with mild spinal stenosis and mild bilateral foraminal stenosis. - At C5-6 disc bulging with mild spinal stenosis and no foraminal stenosis.  IMPRESSION:    MRI lumbar spine (without) demonstrating: - At L4-5: disc bulging and facet hypertrophy with mild right and moderate left foraminal stenosis. - At L3-4: disc bulging and facet hypertrophy with mild biforaminal stenosis.

## 2021-12-17 ENCOUNTER — Other Ambulatory Visit: Payer: Medicare PPO

## 2021-12-22 ENCOUNTER — Ambulatory Visit: Payer: Medicare PPO | Admitting: Cardiovascular Disease

## 2021-12-22 ENCOUNTER — Encounter: Payer: Self-pay | Admitting: Cardiovascular Disease

## 2021-12-22 DIAGNOSIS — I739 Peripheral vascular disease, unspecified: Secondary | ICD-10-CM

## 2021-12-22 NOTE — Assessment & Plan Note (Signed)
Mr. Shannahan returns today for 1 year follow-up of PAD.  I last saw him in April 2022.  He was referred by Dr. [name].  He does have occluded SFAs bilaterally but really really does not have lifestyle-limiting claudication.  He walks minimally and is somewhat limited by dyspnea.  At this point, I see no reason to pursue an invasive evaluation or treatment.  I will see him back as needed.

## 2021-12-22 NOTE — Patient Instructions (Signed)

## 2021-12-22 NOTE — Progress Notes (Signed)
12/22/2021 Alexander Choi   [birth year]  [id number]  Primary Physician Nicoletta Dress, MD Primary Cardiologist: Lorretta Harp MD [name], [place]  HPI:  Alexander Choi is a 88 y.o.  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 [name], for evaluation of PAD.  I last saw him in the office 10/16/2020.Marland Kitchen  His risk factors include treated diabetes hypertension and hyperlipidemia.  He did have coronary artery bypass grafting in 2006 and had a recent heart cath in June 2021 by Dr. [name] in the evaluation of dyspnea that showed patent grafts.  He does have a GERD.  He has a small wound on his left heel which is slowly healing.  He really denies claudication.  He had Dopplers performed 10/13/2020 revealing ABIs of 0.75 bilaterally with occluded SFAs bilaterally.  He denies chest pain or shortness of breath.  Since I saw him a year ago he did have a heart catheterization performed by Dr. [name] 04/09/2021 that showed patent grafts without suitable PCI targets.  He continues to complain of dyspnea.  He walks with a cane.  He denies claudication.   Current Meds  Medication Sig   Accu-Chek Softclix Lancets lancets    acetaminophen (TYLENOL) 500 MG tablet Take 1,000 mg by mouth every 6 (six) hours as needed for moderate pain or mild pain.   albuterol (VENTOLIN HFA) 108 (90 Base) MCG/ACT inhaler Inhale 2 puffs into the lungs every 6 (six) hours as needed for wheezing or shortness of breath.   amLODipine (NORVASC) 5 MG tablet Take 1.5 tablets (7.5 mg total) by mouth at bedtime.   aspirin 81 MG tablet Take 81 mg by mouth daily.   atorvastatin (LIPITOR) 80 MG tablet Take 1 tablet (80 mg total) by mouth daily.   famotidine (PEPCID) 40 MG tablet Take 40 mg by mouth daily.   finasteride (PROSCAR) 5 MG tablet Take 5 mg by mouth daily.   Fluticasone Propionate 0.05 % LOTN Apply  topically once a week.   furosemide (LASIX) 40 MG tablet Take 40 mg by mouth daily.   gabapentin (NEURONTIN) 100 MG capsule Take 100 mg by mouth 2 (two) times daily.   GEMTESA 75 MG TABS Take 1 tablet by mouth daily as needed.   glimepiride (AMARYL) 2 MG tablet Take 4 mg by mouth daily with breakfast.   glucose blood (ACCU-CHEK AVIVA PLUS) test strip    Ketotifen Fumarate (ITCHY EYE DROPS OP) Apply 2 drops to eye daily as needed (itchyness).   levothyroxine (SYNTHROID) 100 MCG tablet Take 1 tablet (100 mcg total) by mouth daily before breakfast.   losartan (COZAAR) 100 MG tablet Take 100 mg by mouth daily.   Magnesium 200 MG CHEW Chew by mouth.   meclizine (ANTIVERT) 25 MG tablet Take 1 tablet (25 mg total) by mouth 3 (three) times daily as needed for dizziness.   meloxicam (MOBIC) 15 MG tablet Take by mouth daily.   metFORMIN (GLUCOPHAGE) 500 MG tablet Take 1 tablet (500 mg total) by mouth 2 (two) times daily with a meal. Resume on 04/13/21.   Multiple Vitamins-Minerals (ICAPS AREDS 2 PO) Take 1 Cartridge by mouth in the morning and at bedtime.   mupirocin ointment (BACTROBAN) 2 % Apply topically as needed for rash.   nitroGLYCERIN (NITROSTAT) 0.4 MG SL tablet Place 1 tablet (0.4 mg total) under the tongue every 5 (  five) minutes as needed for chest pain.   ondansetron (ZOFRAN) 4 MG tablet Take 1 tablet (4 mg total) by mouth every 8 (eight) hours as needed for nausea or vomiting.   Probiotic Product (PROBIOTIC-10 PO) Take by mouth.   ranolazine (RANEXA) 500 MG 12 hr tablet Take 1 tablet (500 mg total) by mouth 2 (two) times daily.   Turmeric (QC TUMERIC COMPLEX PO) Take by mouth.   vitamin B-12 (CYANOCOBALAMIN) 1000 MCG tablet Take 1 tablet (1,000 mcg total) by mouth daily.     Allergies  Allergen Reactions   Ace Inhibitors Other (See Comments)    unknown   Nitrates, Organic Swelling    Swelling in hands   Shellfish Allergy Nausea Only    Pt states scallops-not all shellfish-GI  symptoms/ nausea -50+ years ago   Penicillins Rash    Social History   Socioeconomic History   Marital status: Widowed    Spouse name: Not on file   Number of children: 3   Years of education: Not on file   Highest education level: Not on file  Occupational History   Occupation: state trooper    Comment: retired   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  Tobacco Use   Smoking status: Former    Packs/day: 3.00    Types: Cigarettes   Smokeless tobacco: Never  Vaping Use   Vaping Use: Never used  Substance and Sexual Activity   Alcohol use: No   Drug use: No   Sexual activity: Not Currently  Other Topics Concern   Not on file  Social History Narrative   Not on file   Social Determinants of Health   Financial Resource Strain: Not on file  Food Insecurity: Not on file  Transportation Needs: Not on file  Physical Activity: Not on file  Stress: Not on file  Social Connections: Not on file  Intimate Partner Violence: Not on file     Review of Systems: General: negative for chills, fever, night sweats or weight changes.  Cardiovascular: negative for chest pain, dyspnea on exertion, edema, orthopnea, palpitations, paroxysmal nocturnal dyspnea or shortness of breath Dermatological: negative for rash Respiratory: negative for cough or wheezing Urologic: negative for hematuria Abdominal: negative for nausea, vomiting, diarrhea, bright red blood per rectum, melena, or hematemesis Neurologic: negative for visual changes, syncope, or dizziness All other systems reviewed and are otherwise negative except as noted above.    Blood pressure 140/68, pulse 73, height '5\' 10"'$  (1.778 m), weight 183 lb (83 kg), SpO2 97 %.  General appearance: alert and no distress Neck: no adenopathy, no carotid bruit, no JVD, supple, symmetrical, trachea midline, and thyroid not enlarged, symmetric, no tenderness/mass/nodules Lungs: clear to auscultation bilaterally Heart: regular rate and rhythm, S1, S2 normal, no  murmur, click, rub or gallop Extremities: extremities normal, atraumatic, no cyanosis or edema Pulses: Absent pedal pulses Skin: Skin color, texture, turgor normal. No rashes or lesions Neurologic: Grossly normal  EKG sinus rhythm at 73 with nonspecific ST and T wave changes.  I personally reviewed this EKG.  ASSESSMENT AND PLAN:   Peripheral arterial disease [facility]) Mr. Engram returns today for 1 year follow-up of PAD.  I last saw him in April 2022.  He was referred by Dr. [name].  He does have occluded SFAs bilaterally but really really does not have lifestyle-limiting claudication.  He walks minimally and is somewhat limited by dyspnea.  At this point, I see no reason to pursue an invasive evaluation or treatment.  I will see  him back as needed.     Lorretta Harp MD FACP,FACC,FAHA, [facility] 12/22/2021 10:10 AM

## 2021-12-24 DIAGNOSIS — R0609 Other forms of dyspnea: Secondary | ICD-10-CM | POA: Diagnosis not present

## 2022-01-05 DIAGNOSIS — E11621 Type 2 diabetes mellitus with foot ulcer: Secondary | ICD-10-CM | POA: Diagnosis not present

## 2022-01-05 DIAGNOSIS — Z7984 Long term (current) use of oral hypoglycemic drugs: Secondary | ICD-10-CM | POA: Diagnosis not present

## 2022-01-05 DIAGNOSIS — L97519 Non-pressure chronic ulcer of other part of right foot with unspecified severity: Secondary | ICD-10-CM | POA: Diagnosis not present

## 2022-01-05 DIAGNOSIS — M14672 Charcot's joint, left ankle and foot: Secondary | ICD-10-CM | POA: Diagnosis not present

## 2022-01-05 DIAGNOSIS — E1142 Type 2 diabetes mellitus with diabetic polyneuropathy: Secondary | ICD-10-CM | POA: Diagnosis not present

## 2022-01-06 DIAGNOSIS — R0602 Shortness of breath: Secondary | ICD-10-CM | POA: Diagnosis not present

## 2022-01-06 DIAGNOSIS — R0609 Other forms of dyspnea: Secondary | ICD-10-CM | POA: Diagnosis not present

## 2022-01-11 DIAGNOSIS — R2689 Other abnormalities of gait and mobility: Secondary | ICD-10-CM | POA: Diagnosis not present

## 2022-01-11 DIAGNOSIS — M6281 Muscle weakness (generalized): Secondary | ICD-10-CM | POA: Diagnosis not present

## 2022-01-11 DIAGNOSIS — R2681 Unsteadiness on feet: Secondary | ICD-10-CM | POA: Diagnosis not present

## 2022-01-14 DIAGNOSIS — I7025 Atherosclerosis of native arteries of other extremities with ulceration: Secondary | ICD-10-CM | POA: Diagnosis not present

## 2022-01-14 DIAGNOSIS — E039 Hypothyroidism, unspecified: Secondary | ICD-10-CM | POA: Diagnosis not present

## 2022-01-14 DIAGNOSIS — H3530 Unspecified macular degeneration: Secondary | ICD-10-CM | POA: Diagnosis not present

## 2022-01-14 DIAGNOSIS — K219 Gastro-esophageal reflux disease without esophagitis: Secondary | ICD-10-CM | POA: Diagnosis not present

## 2022-01-14 DIAGNOSIS — L97802 Non-pressure chronic ulcer of other part of unspecified lower leg with fat layer exposed: Secondary | ICD-10-CM | POA: Diagnosis not present

## 2022-01-14 DIAGNOSIS — L97501 Non-pressure chronic ulcer of other part of unspecified foot limited to breakdown of skin: Secondary | ICD-10-CM | POA: Diagnosis not present

## 2022-01-14 DIAGNOSIS — E785 Hyperlipidemia, unspecified: Secondary | ICD-10-CM | POA: Diagnosis not present

## 2022-01-14 DIAGNOSIS — I25119 Atherosclerotic heart disease of native coronary artery with unspecified angina pectoris: Secondary | ICD-10-CM | POA: Diagnosis not present

## 2022-01-14 DIAGNOSIS — I252 Old myocardial infarction: Secondary | ICD-10-CM | POA: Diagnosis not present

## 2022-01-14 DIAGNOSIS — E1151 Type 2 diabetes mellitus with diabetic peripheral angiopathy without gangrene: Secondary | ICD-10-CM | POA: Diagnosis not present

## 2022-01-14 DIAGNOSIS — E1142 Type 2 diabetes mellitus with diabetic polyneuropathy: Secondary | ICD-10-CM | POA: Diagnosis not present

## 2022-01-14 DIAGNOSIS — I471 Supraventricular tachycardia: Secondary | ICD-10-CM | POA: Diagnosis not present

## 2022-01-14 DIAGNOSIS — E11621 Type 2 diabetes mellitus with foot ulcer: Secondary | ICD-10-CM | POA: Diagnosis not present

## 2022-01-14 DIAGNOSIS — I130 Hypertensive heart and chronic kidney disease with heart failure and stage 1 through stage 4 chronic kidney disease, or unspecified chronic kidney disease: Secondary | ICD-10-CM | POA: Diagnosis not present

## 2022-01-14 DIAGNOSIS — E11618 Type 2 diabetes mellitus with other diabetic arthropathy: Secondary | ICD-10-CM | POA: Diagnosis not present

## 2022-01-14 DIAGNOSIS — E1159 Type 2 diabetes mellitus with other circulatory complications: Secondary | ICD-10-CM | POA: Diagnosis not present

## 2022-01-14 DIAGNOSIS — J439 Emphysema, unspecified: Secondary | ICD-10-CM | POA: Diagnosis not present

## 2022-01-14 DIAGNOSIS — Z89421 Acquired absence of other right toe(s): Secondary | ICD-10-CM | POA: Diagnosis not present

## 2022-01-19 DIAGNOSIS — R2681 Unsteadiness on feet: Secondary | ICD-10-CM | POA: Diagnosis not present

## 2022-01-19 DIAGNOSIS — M6281 Muscle weakness (generalized): Secondary | ICD-10-CM | POA: Diagnosis not present

## 2022-01-19 DIAGNOSIS — R2689 Other abnormalities of gait and mobility: Secondary | ICD-10-CM | POA: Diagnosis not present

## 2022-01-25 DIAGNOSIS — R2689 Other abnormalities of gait and mobility: Secondary | ICD-10-CM | POA: Diagnosis not present

## 2022-01-25 DIAGNOSIS — R2681 Unsteadiness on feet: Secondary | ICD-10-CM | POA: Diagnosis not present

## 2022-01-25 DIAGNOSIS — M6281 Muscle weakness (generalized): Secondary | ICD-10-CM | POA: Diagnosis not present

## 2022-01-26 DIAGNOSIS — M6281 Muscle weakness (generalized): Secondary | ICD-10-CM | POA: Diagnosis not present

## 2022-01-26 DIAGNOSIS — R2681 Unsteadiness on feet: Secondary | ICD-10-CM | POA: Diagnosis not present

## 2022-01-26 DIAGNOSIS — R2689 Other abnormalities of gait and mobility: Secondary | ICD-10-CM | POA: Diagnosis not present

## 2022-01-28 DIAGNOSIS — E039 Hypothyroidism, unspecified: Secondary | ICD-10-CM | POA: Diagnosis not present

## 2022-01-28 DIAGNOSIS — E1165 Type 2 diabetes mellitus with hyperglycemia: Secondary | ICD-10-CM | POA: Diagnosis not present

## 2022-01-28 DIAGNOSIS — E1140 Type 2 diabetes mellitus with diabetic neuropathy, unspecified: Secondary | ICD-10-CM | POA: Diagnosis not present

## 2022-01-28 DIAGNOSIS — I10 Essential (primary) hypertension: Secondary | ICD-10-CM | POA: Diagnosis not present

## 2022-01-28 DIAGNOSIS — D638 Anemia in other chronic diseases classified elsewhere: Secondary | ICD-10-CM | POA: Diagnosis not present

## 2022-01-28 DIAGNOSIS — I25810 Atherosclerosis of coronary artery bypass graft(s) without angina pectoris: Secondary | ICD-10-CM | POA: Diagnosis not present

## 2022-01-28 DIAGNOSIS — J309 Allergic rhinitis, unspecified: Secondary | ICD-10-CM | POA: Diagnosis not present

## 2022-01-28 DIAGNOSIS — G4762 Sleep related leg cramps: Secondary | ICD-10-CM | POA: Diagnosis not present

## 2022-01-28 DIAGNOSIS — E785 Hyperlipidemia, unspecified: Secondary | ICD-10-CM | POA: Diagnosis not present

## 2022-01-31 DIAGNOSIS — R2681 Unsteadiness on feet: Secondary | ICD-10-CM | POA: Diagnosis not present

## 2022-01-31 DIAGNOSIS — M6281 Muscle weakness (generalized): Secondary | ICD-10-CM | POA: Diagnosis not present

## 2022-01-31 DIAGNOSIS — R2689 Other abnormalities of gait and mobility: Secondary | ICD-10-CM | POA: Diagnosis not present

## 2022-02-03 DIAGNOSIS — R2681 Unsteadiness on feet: Secondary | ICD-10-CM | POA: Diagnosis not present

## 2022-02-03 DIAGNOSIS — R2689 Other abnormalities of gait and mobility: Secondary | ICD-10-CM | POA: Diagnosis not present

## 2022-02-03 DIAGNOSIS — M6281 Muscle weakness (generalized): Secondary | ICD-10-CM | POA: Diagnosis not present

## 2022-02-07 DIAGNOSIS — H6122 Impacted cerumen, left ear: Secondary | ICD-10-CM | POA: Diagnosis not present

## 2022-02-07 DIAGNOSIS — R2689 Other abnormalities of gait and mobility: Secondary | ICD-10-CM | POA: Diagnosis not present

## 2022-02-07 DIAGNOSIS — T161XXA Foreign body in right ear, initial encounter: Secondary | ICD-10-CM | POA: Diagnosis not present

## 2022-02-07 DIAGNOSIS — Z974 Presence of external hearing-aid: Secondary | ICD-10-CM | POA: Diagnosis not present

## 2022-02-07 DIAGNOSIS — H903 Sensorineural hearing loss, bilateral: Secondary | ICD-10-CM | POA: Diagnosis not present

## 2022-02-07 DIAGNOSIS — M6281 Muscle weakness (generalized): Secondary | ICD-10-CM | POA: Diagnosis not present

## 2022-02-07 DIAGNOSIS — R2681 Unsteadiness on feet: Secondary | ICD-10-CM | POA: Diagnosis not present

## 2022-02-09 DIAGNOSIS — E1142 Type 2 diabetes mellitus with diabetic polyneuropathy: Secondary | ICD-10-CM | POA: Diagnosis not present

## 2022-02-09 DIAGNOSIS — M14671 Charcot's joint, right ankle and foot: Secondary | ICD-10-CM | POA: Diagnosis not present

## 2022-02-09 DIAGNOSIS — M14672 Charcot's joint, left ankle and foot: Secondary | ICD-10-CM | POA: Diagnosis not present

## 2022-02-09 DIAGNOSIS — Z7984 Long term (current) use of oral hypoglycemic drugs: Secondary | ICD-10-CM | POA: Diagnosis not present

## 2022-02-09 DIAGNOSIS — L97512 Non-pressure chronic ulcer of other part of right foot with fat layer exposed: Secondary | ICD-10-CM | POA: Diagnosis not present

## 2022-02-09 DIAGNOSIS — Z89421 Acquired absence of other right toe(s): Secondary | ICD-10-CM | POA: Diagnosis not present

## 2022-02-09 DIAGNOSIS — E11621 Type 2 diabetes mellitus with foot ulcer: Secondary | ICD-10-CM | POA: Diagnosis not present

## 2022-02-14 DIAGNOSIS — M6281 Muscle weakness (generalized): Secondary | ICD-10-CM | POA: Diagnosis not present

## 2022-02-14 DIAGNOSIS — R2681 Unsteadiness on feet: Secondary | ICD-10-CM | POA: Diagnosis not present

## 2022-02-14 DIAGNOSIS — R2689 Other abnormalities of gait and mobility: Secondary | ICD-10-CM | POA: Diagnosis not present

## 2022-02-16 DIAGNOSIS — R2681 Unsteadiness on feet: Secondary | ICD-10-CM | POA: Diagnosis not present

## 2022-02-16 DIAGNOSIS — M6281 Muscle weakness (generalized): Secondary | ICD-10-CM | POA: Diagnosis not present

## 2022-02-16 DIAGNOSIS — R2689 Other abnormalities of gait and mobility: Secondary | ICD-10-CM | POA: Diagnosis not present

## 2022-02-18 DIAGNOSIS — E1140 Type 2 diabetes mellitus with diabetic neuropathy, unspecified: Secondary | ICD-10-CM | POA: Diagnosis not present

## 2022-02-21 DIAGNOSIS — L089 Local infection of the skin and subcutaneous tissue, unspecified: Secondary | ICD-10-CM | POA: Diagnosis not present

## 2022-02-21 DIAGNOSIS — M7989 Other specified soft tissue disorders: Secondary | ICD-10-CM | POA: Diagnosis not present

## 2022-02-21 DIAGNOSIS — M86171 Other acute osteomyelitis, right ankle and foot: Secondary | ICD-10-CM | POA: Diagnosis not present

## 2022-02-24 DIAGNOSIS — M86171 Other acute osteomyelitis, right ankle and foot: Secondary | ICD-10-CM | POA: Diagnosis not present

## 2022-02-24 DIAGNOSIS — M609 Myositis, unspecified: Secondary | ICD-10-CM | POA: Diagnosis not present

## 2022-02-24 DIAGNOSIS — M7989 Other specified soft tissue disorders: Secondary | ICD-10-CM | POA: Diagnosis not present

## 2022-02-24 DIAGNOSIS — S91301A Unspecified open wound, right foot, initial encounter: Secondary | ICD-10-CM | POA: Diagnosis not present

## 2022-02-25 DIAGNOSIS — E11621 Type 2 diabetes mellitus with foot ulcer: Secondary | ICD-10-CM | POA: Diagnosis not present

## 2022-02-25 DIAGNOSIS — L97512 Non-pressure chronic ulcer of other part of right foot with fat layer exposed: Secondary | ICD-10-CM | POA: Diagnosis not present

## 2022-02-25 DIAGNOSIS — L97412 Non-pressure chronic ulcer of right heel and midfoot with fat layer exposed: Secondary | ICD-10-CM | POA: Diagnosis not present

## 2022-02-25 DIAGNOSIS — L03115 Cellulitis of right lower limb: Secondary | ICD-10-CM | POA: Diagnosis not present

## 2022-02-25 DIAGNOSIS — E1169 Type 2 diabetes mellitus with other specified complication: Secondary | ICD-10-CM | POA: Diagnosis not present

## 2022-03-02 DIAGNOSIS — L97519 Non-pressure chronic ulcer of other part of right foot with unspecified severity: Secondary | ICD-10-CM | POA: Diagnosis not present

## 2022-03-02 DIAGNOSIS — E11621 Type 2 diabetes mellitus with foot ulcer: Secondary | ICD-10-CM | POA: Diagnosis not present

## 2022-03-02 DIAGNOSIS — L03115 Cellulitis of right lower limb: Secondary | ICD-10-CM | POA: Diagnosis not present

## 2022-03-02 DIAGNOSIS — M14671 Charcot's joint, right ankle and foot: Secondary | ICD-10-CM | POA: Diagnosis not present

## 2022-03-02 DIAGNOSIS — Z89421 Acquired absence of other right toe(s): Secondary | ICD-10-CM | POA: Diagnosis not present

## 2022-03-02 DIAGNOSIS — H524 Presbyopia: Secondary | ICD-10-CM | POA: Diagnosis not present

## 2022-03-04 DIAGNOSIS — E11621 Type 2 diabetes mellitus with foot ulcer: Secondary | ICD-10-CM | POA: Diagnosis not present

## 2022-03-04 DIAGNOSIS — L97412 Non-pressure chronic ulcer of right heel and midfoot with fat layer exposed: Secondary | ICD-10-CM | POA: Diagnosis not present

## 2022-03-04 DIAGNOSIS — Z7984 Long term (current) use of oral hypoglycemic drugs: Secondary | ICD-10-CM | POA: Diagnosis not present

## 2022-03-04 DIAGNOSIS — L97512 Non-pressure chronic ulcer of other part of right foot with fat layer exposed: Secondary | ICD-10-CM | POA: Diagnosis not present

## 2022-03-07 DIAGNOSIS — I10 Essential (primary) hypertension: Secondary | ICD-10-CM | POA: Diagnosis not present

## 2022-03-07 DIAGNOSIS — C44629 Squamous cell carcinoma of skin of left upper limb, including shoulder: Secondary | ICD-10-CM | POA: Diagnosis not present

## 2022-03-07 DIAGNOSIS — L821 Other seborrheic keratosis: Secondary | ICD-10-CM | POA: Diagnosis not present

## 2022-03-07 DIAGNOSIS — I739 Peripheral vascular disease, unspecified: Secondary | ICD-10-CM | POA: Diagnosis not present

## 2022-03-07 DIAGNOSIS — E11621 Type 2 diabetes mellitus with foot ulcer: Secondary | ICD-10-CM | POA: Diagnosis not present

## 2022-03-07 DIAGNOSIS — Z87891 Personal history of nicotine dependence: Secondary | ICD-10-CM | POA: Diagnosis not present

## 2022-03-07 DIAGNOSIS — E785 Hyperlipidemia, unspecified: Secondary | ICD-10-CM | POA: Diagnosis not present

## 2022-03-07 DIAGNOSIS — L570 Actinic keratosis: Secondary | ICD-10-CM | POA: Diagnosis not present

## 2022-03-07 DIAGNOSIS — L97412 Non-pressure chronic ulcer of right heel and midfoot with fat layer exposed: Secondary | ICD-10-CM | POA: Diagnosis not present

## 2022-03-07 DIAGNOSIS — Z7982 Long term (current) use of aspirin: Secondary | ICD-10-CM | POA: Diagnosis not present

## 2022-03-11 DIAGNOSIS — L97519 Non-pressure chronic ulcer of other part of right foot with unspecified severity: Secondary | ICD-10-CM | POA: Diagnosis not present

## 2022-03-11 DIAGNOSIS — L97512 Non-pressure chronic ulcer of other part of right foot with fat layer exposed: Secondary | ICD-10-CM | POA: Diagnosis not present

## 2022-03-11 DIAGNOSIS — E11621 Type 2 diabetes mellitus with foot ulcer: Secondary | ICD-10-CM | POA: Diagnosis not present

## 2022-03-11 DIAGNOSIS — L97529 Non-pressure chronic ulcer of other part of left foot with unspecified severity: Secondary | ICD-10-CM | POA: Diagnosis not present

## 2022-03-13 NOTE — Progress Notes (Deleted)
Cardiology Office Note:    Date:  03/13/2022   ID:  Alexander Choi, DOB Aug 10, 1932, MRN [id number]  PCP:  Nicoletta Dress, MD  Cardiologist:  Chidera Thivierge [name], MD   Referring MD: Nicoletta Dress, MD   No chief complaint on file.    History of Present Illness:    JOSHUAJAMES Choi is a 88 y.o. male with a hx of CAD status post CABG in 09/17/04, Myoview in October 2015 that showed inferior ischemia and led to a heart catheterization that showed all grafts patent with severe disease in the native left circumflex that predated his CABG.  He also has hypertension, hyperlipidemia, DM.  His wife passed away in 2017-09-17.  He was admitted in October 2020 at [facility] for bowel obstruction treated with surgical lysis of adhesions.  1 week after discharge he fell in his garage and required shoulder surgery in December 2020.  He is aggressively treated for indigestion with Tagamet and Protonix without improvement.  He underwent EGD that showed mild inflammation.  He has symptoms after eating a meal but also occur with activity.  Due to some chest discomfort with radiation to his left arm, he underwent right and left heart catheterization.  Right heart pressures were normal and his angiogram was stable.  There was an attempt to open the chronically occluded left circumflex but this was unsuccessful due to inability to cross the lesion with a wire.  In retrospect the vessel was really unchanged dating back before his CABG.  He was seen in the ER 01/09/2020 for chest pain but ruled out with negative troponins.  Chest pain resolved after GI cocktail.  He was last seen by Dr. [name] on 09/29/2020 after being hospitalized at [facility] for internal bleeding.  He underwent ABIs and arterial duplex found to have PAD and referred to Dr. Gwenlyn Found. He had abnormal dopplers indicating occluded SFAs. In the absence of lifestyle limiting claudication, invasive intervention was deferred with plans to follow up in 6 months.    He presented in October with worsening chest pain. HS troponin trended from 79 --> 878. EKG appeared unchanged from prior tracings. Repeat heart catheterization was performed and showed continued patency of 3/3 grafts and severely disease, calcified and aneurysmal LCX. Flow appeared worse compared to prior study in the LCX, however, no targets for PCI. Continued medical therapy was recommended. He did not tolerate higher doses of ranexa. Amlodipine increased from 5 mg to 7.5 mg. Intolerant of nitrates due to headache, vomiting, and hand swelling. Resumed 500 mg ranexa BID.   He was hospitalized in December 2022 the setting of fall due to left-sided weakness.  Work-up was negative for stroke. Was seen by Dr Gwenlyn Found in June for follow up of PAD with stable symptoms.   On follow up today he is feeling much better. Decreased dyspnea. No chest pain. Tolerating medication well.    Past Medical History:  Diagnosis Date   Cancer [facility])    Chronic kidney disease    kidney stones   Coronary artery disease    with CABG in 09-17-2004 with LIMA to LAD, SVG to mid and distal OM, SVG to AM. Normal Myoview in May of 2013   Diabetes mellitus    Type 2   GERD (gastroesophageal reflux disease)    Hematuria    Hypercholesterolemia    Hypertension    Irritable bowel syndrome    Loose stools    Prostate cancer (HCC)    Urinary obstruction  Past Surgical History:  Procedure Laterality Date   CHOLECYSTECTOMY     CORONARY ARTERY BYPASS GRAFT  2006   LEFT HEART CATH AND CORS/GRAFTS ANGIOGRAPHY N/A 04/09/2021   Procedure: LEFT HEART CATH AND CORS/GRAFTS ANGIOGRAPHY;  Surgeon: [name], Cheronda Erck M, MD;  Location: [place] CV LAB;  Service: Cardiovascular;  Laterality: N/A;   LEFT HEART CATHETERIZATION WITH CORONARY/GRAFT ANGIOGRAM N/A 04/11/2014   Procedure: LEFT HEART CATHETERIZATION WITH [name];  Surgeon: Kostantinos Tallman M [name], MD;  Location: [facility] CATH LAB;  Service: Cardiovascular;  Laterality: N/A;    OTHER SURGICAL HISTORY     bowel obstruction   RADIOACTIVE SEED IMPLANT     for prostate cancer   RIGHT/LEFT HEART CATH AND CORONARY/GRAFT ANGIOGRAPHY N/A 12/12/2019   Procedure: RIGHT/LEFT HEART CATH AND CORONARY/GRAFT ANGIOGRAPHY;  Surgeon: [name], Sujey Gundry M, MD;  Location: [place] CV LAB;  Service: Cardiovascular;  Laterality: N/A;   SKIN CANCER EXCISION     TEMPORARY PACEMAKER N/A 12/13/2019   Procedure: TEMPORARY PACEMAKER;  Surgeon: [name], Voncille Simm M, MD;  Location: [place] CV LAB;  Service: Cardiovascular;  Laterality: N/A;    Current Medications: No outpatient medications have been marked as taking for the 03/16/22 encounter (Appointment) with [name], Hadar Elgersma M, MD.     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  Tobacco Use   Smoking status: Former    Packs/day: 3.00    Types: Cigarettes   Smokeless tobacco: Never  Vaping Use   Vaping Use: Never used  Substance and Sexual Activity   Alcohol use: No   Drug use: No   Sexual activity: Not Currently  Other Topics Concern   Not on file  Social History Narrative   Not on file   Social Determinants of Health   Financial Resource Strain: Not on file  Food Insecurity: Not on file  Transportation Needs: Not on file  Physical Activity: Not on file  Stress: Not on file  Social Connections: Not on file     Family History: The patient's family history includes Cancer in his father and mother; Diabetes in his father; Heart attack in his father; Hypertension in his father and mother.  ROS:   Please see the history of present illness.     All other systems reviewed and are  negative.  EKGs/Labs/Other Studies Reviewed:    The following studies were reviewed today:  Heart monitor 01/15/20: Normal sinus rhythm Occasional PACs. Occasional brief runs of PAT longest lasting 18.5 seconds.   Staged PCI 12/13/19: Dist LAD-1 lesion is 60% stenosed. Prox Cx lesion is 99% stenosed. Prox Cx to Mid Cx lesion is 90% stenosed. 2nd Mrg lesion is 75% stenosed. Unsuccessful PCI due to inability to cross with a wire.   1. Unsuccessful PCI of the LCx due to inability to cross the lesion with a wire.    Plan: medical therapy. ASA only. Will increase amlodipine to 5 mg daily. Consider Ranexa if symptoms persists. Anticipate DC tomorrow am.   Heart cath 12/12/19: Mid LM lesion is 35% stenosed. Ost LAD to Mid LAD lesion is 100% stenosed. Prox Cx lesion is 99% stenosed. Prox  Cx to Mid Cx lesion is 90% stenosed. 1st Mrg lesion is 100% stenosed. 2nd Mrg lesion is 75% stenosed. Prox RCA to Dist RCA lesion is 100% stenosed. LIMA graft was visualized by angiography and is normal in caliber. The graft exhibits no disease. Dist LAD-1 lesion is 60% stenosed. Dist LAD-2 lesion is 90% stenosed. SVG graft was visualized by angiography and is large. The graft exhibits minimal luminal irregularities. SVG graft was visualized by angiography and is normal in caliber. The graft exhibits mild . The left ventricular systolic function is normal. LV end diastolic pressure is normal. The left ventricular ejection fraction is 55-65% by visual estimate.   1. Complex 3 vessel obstructive CAD.  2. Patent LIMA to the LAD. There is severe disease in the distal LAD. The vessel is small in caliber and not suitable for PCI 3. Patent SVG to OM1 4. Patent SVG to RV marginal branch. The RCA is occluded with left to right collaterals from the LCx to the distal RCA 5. Normal LV function 6. Normal LV filling pressures 7. Normal right heart pressures   Plan: The native LCX is severely diseased  proximally and in the mid vessel with aneurysmal dilation and heavy calcification. This supplies the second and third OM branches and collaterals to the distal RCA. This is a potential target for intervention. It will require atherectomy. Given progressive symptoms will admit to telemetry. Start IV heparin. Load with Plavix. Planned staged complex PCI of the LCx tomorrow.   Cardiac cath 04/09/21:  LEFT HEART CATH AND CORS/GRAFTS ANGIOGRAPHY   Conclusion      Mid LM lesion is 35% stenosed.   Ost LAD to Mid LAD lesion is 100% stenosed.   Dist LAD-1 lesion is 60% stenosed.   Dist LAD-2 lesion is 90% stenosed.   Prox RCA to Dist RCA lesion is 100% stenosed.   1st Mrg lesion is 100% stenosed.   2nd Mrg lesion is 75% stenosed.   Prox Cx lesion is 99% stenosed.   Prox Cx to Mid Cx lesion is 95% stenosed.   LIMA and is normal in caliber.   and is normal in caliber.   and is large.   The graft exhibits no disease.   The graft exhibits minimal luminal irregularities.   The graft exhibits mild .   The left ventricular systolic function is normal.   LV end diastolic pressure is normal.   The left ventricular ejection fraction is 55-65% by visual estimate.   Severe 3 vessel obstructive CAD Continued patency of bypass grafts including LIMA to LAD, SVG to OM1, SVG to RV marginal vessel Normal LV function Normal LVEDP   Plan: I suspect his culprit is the native LCx. This vessel is severely diseased, calcified and aneurysmal. Flow appears worse compared to prior study. We have attempted PCI of this in the past but unable to cross with a wire. There are no targets for PCI. Will continue medical therapy. Intolerant of higher Ranexa dose. Will increase amlodipine.    Echo 04/10/21:  IMPRESSIONS     1. Left ventricular ejection fraction, by estimation, is 60 to 65%. The  left ventricle has normal function. The left ventricle has no regional  wall motion abnormalities. Left ventricular diastolic  parameters are  consistent with Grade I diastolic  dysfunction (impaired relaxation).   2. Right ventricular systolic function is normal. The right ventricular  size is normal.   3. The mitral valve is abnormal. Trivial mitral valve regurgitation. No  evidence  of mitral stenosis.   4. The aortic valve was not well visualized. Aortic valve regurgitation  is not visualized. Mild to moderate aortic valve sclerosis/calcification  is present, without any evidence of aortic stenosis.   5. The inferior vena cava is normal in size with greater than 50%  respiratory variability, suggesting right atrial pressure of 3 mmHg.   EKG:  EKG is not ordered today.  Recent Labs: 06/04/2021: ALT 19; TSH 0.585 06/05/2021: B Natriuretic Peptide 343.9 06/06/2021: Hemoglobin 9.5; Platelets 178 06/07/2021: BUN 26; Creatinine, Ser 0.77; Potassium 3.7; Sodium 134  Recent Lipid Panel    Component Value Date/Time   CHOL 95 04/10/2021 0231   TRIG 102 04/10/2021 0231   HDL 27 (L) 04/10/2021 0231   CHOLHDL 3.5 04/10/2021 0231   VLDL 20 04/10/2021 0231   LDLCALC 48 04/10/2021 0231   Dated 04/16/21: A1c 6.8%.  Dated 01/28/22: A1c 7.3%. cholesterol 121, triglycerides 118, HDL 35, LDL 64. CMET normal. Hgb 11.2. otherwise CBC and TSH normal  Physical Exam:    VS:  There were no vitals taken for this visit.    Wt Readings from Last 3 Encounters:  12/22/21 183 lb (83 kg)  10/18/21 179 lb 8 oz (81.4 kg)  09/21/21 186 lb (84.4 kg)     GEN:  Well nourished, well developed in no acute distress HEENT: Normal NECK: No JVD; No carotid bruits LYMPHATICS: No lymphadenopathy CARDIAC: RRR, soft systolic murmur RESPIRATORY:  Clear to auscultation without rales, wheezing or rhonchi  ABDOMEN: Soft, non-tender, non-distended MUSCULOSKELETAL:  No edema; No deformity  SKIN: Warm and dry NEUROLOGIC:  Alert and oriented x 3 PSYCHIATRIC:  Normal affect   ASSESSMENT:    No diagnosis found.   PLAN:    CAD status post  CABG 2006 (LIMA-LAD, SVG-RV marginal branch, SVG-OM1) - Recent heart catheterization with patent grafts - Chronically occluded LCX - previously unable to cross with wire.  - stable disease on heart cath in October. - continue medical management with ranexa and amlodipine, ASA, statin - not on BB - intolerant of nitrates.  - actually feeling much better today with higher amlodipine dose.  2. Hyperlipidemia with LDL goal < 70 - Continue 80 mg lipitor  3. DM2 - Per PCP - on metformin and glimiperide.   4. Hypertension - amlodipoine, losartan - BP control improved.   5. Paroxysmal atrial tachycardia - Noted on telemetry in hospital and heart monitor 2021 - not on a BB - no palpitations   6. PAD - ulcer on heel now healed.   Follow up in 4 months.  Medication Adjustments/Labs and Tests Ordered: Current medicines are reviewed at length with the patient today.  Concerns regarding medicines are outlined above.  No orders of the defined types were placed in this encounter.    No orders of the defined types were placed in this encounter.     Signed, Bhavana Kady [name], MD  03/13/2022 7:53 AM    [facility]

## 2022-03-16 ENCOUNTER — Ambulatory Visit: Payer: Medicare PPO | Admitting: Cardiology

## 2022-03-18 DIAGNOSIS — E11621 Type 2 diabetes mellitus with foot ulcer: Secondary | ICD-10-CM | POA: Diagnosis not present

## 2022-03-18 DIAGNOSIS — L97519 Non-pressure chronic ulcer of other part of right foot with unspecified severity: Secondary | ICD-10-CM | POA: Diagnosis not present

## 2022-03-18 DIAGNOSIS — L97512 Non-pressure chronic ulcer of other part of right foot with fat layer exposed: Secondary | ICD-10-CM | POA: Diagnosis not present

## 2022-03-18 DIAGNOSIS — L97412 Non-pressure chronic ulcer of right heel and midfoot with fat layer exposed: Secondary | ICD-10-CM | POA: Diagnosis not present

## 2022-03-18 DIAGNOSIS — Z7984 Long term (current) use of oral hypoglycemic drugs: Secondary | ICD-10-CM | POA: Diagnosis not present

## 2022-03-25 DIAGNOSIS — E11621 Type 2 diabetes mellitus with foot ulcer: Secondary | ICD-10-CM | POA: Diagnosis not present

## 2022-03-25 DIAGNOSIS — L97412 Non-pressure chronic ulcer of right heel and midfoot with fat layer exposed: Secondary | ICD-10-CM | POA: Diagnosis not present

## 2022-03-25 DIAGNOSIS — L97512 Non-pressure chronic ulcer of other part of right foot with fat layer exposed: Secondary | ICD-10-CM | POA: Diagnosis not present

## 2022-04-01 DIAGNOSIS — L97412 Non-pressure chronic ulcer of right heel and midfoot with fat layer exposed: Secondary | ICD-10-CM | POA: Diagnosis not present

## 2022-04-01 DIAGNOSIS — E11621 Type 2 diabetes mellitus with foot ulcer: Secondary | ICD-10-CM | POA: Diagnosis not present

## 2022-04-05 DIAGNOSIS — E11621 Type 2 diabetes mellitus with foot ulcer: Secondary | ICD-10-CM | POA: Diagnosis not present

## 2022-04-05 DIAGNOSIS — L97519 Non-pressure chronic ulcer of other part of right foot with unspecified severity: Secondary | ICD-10-CM | POA: Diagnosis not present

## 2022-04-08 DIAGNOSIS — E11610 Type 2 diabetes mellitus with diabetic neuropathic arthropathy: Secondary | ICD-10-CM | POA: Diagnosis not present

## 2022-04-08 DIAGNOSIS — L97412 Non-pressure chronic ulcer of right heel and midfoot with fat layer exposed: Secondary | ICD-10-CM | POA: Diagnosis not present

## 2022-04-08 DIAGNOSIS — I10 Essential (primary) hypertension: Secondary | ICD-10-CM | POA: Diagnosis not present

## 2022-04-08 DIAGNOSIS — E785 Hyperlipidemia, unspecified: Secondary | ICD-10-CM | POA: Diagnosis not present

## 2022-04-08 DIAGNOSIS — E11621 Type 2 diabetes mellitus with foot ulcer: Secondary | ICD-10-CM | POA: Diagnosis not present

## 2022-04-08 DIAGNOSIS — L97419 Non-pressure chronic ulcer of right heel and midfoot with unspecified severity: Secondary | ICD-10-CM | POA: Diagnosis not present

## 2022-04-08 DIAGNOSIS — L97512 Non-pressure chronic ulcer of other part of right foot with fat layer exposed: Secondary | ICD-10-CM | POA: Diagnosis not present

## 2022-04-08 NOTE — Progress Notes (Deleted)
Cardiology Clinic Note   Patient Name: Alexander Choi Date of Encounter: 04/08/2022  Primary Care Provider:  Nicoletta Dress, MD Primary Cardiologist:  Peter [name], MD  Patient Profile    ***  Past Medical History    Past Medical History:  Diagnosis Date   Cancer [facility])    Chronic kidney disease    kidney stones   Coronary artery disease    with CABG in 2006 with LIMA to LAD, SVG to mid and distal OM, SVG to AM. Normal Myoview in May of 2013   Diabetes mellitus    Type 2   GERD (gastroesophageal reflux disease)    Hematuria    Hypercholesterolemia    Hypertension    Irritable bowel syndrome    Loose stools    Prostate cancer ([place])    Urinary obstruction    Past Surgical History:  Procedure Laterality Date   CHOLECYSTECTOMY     CORONARY ARTERY BYPASS GRAFT  2006   LEFT HEART CATH AND CORS/GRAFTS ANGIOGRAPHY N/A 04/09/2021   Procedure: LEFT HEART CATH AND CORS/GRAFTS ANGIOGRAPHY;  Surgeon: [name], Peter M, MD;  Location: [place] [facility];  Service: Cardiovascular;  Laterality: N/A;   LEFT HEART CATHETERIZATION WITH CORONARY/GRAFT ANGIOGRAM N/A 04/11/2014   Procedure: LEFT HEART CATHETERIZATION WITH [name];  Surgeon: Peter M [name], MD;  Location: [facility] CATH LAB;  Service: Cardiovascular;  Laterality: N/A;   OTHER SURGICAL HISTORY     bowel obstruction   RADIOACTIVE SEED IMPLANT     for prostate cancer   RIGHT/LEFT HEART CATH AND CORONARY/GRAFT ANGIOGRAPHY N/A 12/12/2019   Procedure: RIGHT/LEFT HEART CATH AND CORONARY/GRAFT ANGIOGRAPHY;  Surgeon: [name], Peter M, MD;  Location: [place] [facility];  Service: Cardiovascular;  Laterality: N/A;   SKIN CANCER EXCISION     TEMPORARY PACEMAKER N/A 12/13/2019   Procedure: TEMPORARY PACEMAKER;  Surgeon: [name], Peter M, MD;  Location: [place] [facility];  Service: Cardiovascular;  Laterality: N/A;    Allergies  Allergies  Allergen Reactions   Ace Inhibitors Other (See Comments)    unknown    Nitrates, Organic Swelling    Swelling in hands   Shellfish Allergy Nausea Only    Pt states scallops-not all shellfish-GI symptoms/ nausea -50+ years ago   Penicillins Rash    History of Present Illness    ***  Home Medications    Prior to Admission medications   Medication Sig Start Date End Date Taking? Authorizing Provider  Accu-Chek Softclix Lancets lancets  07/04/19   [provider]  acetaminophen (TYLENOL) 500 MG tablet Take 1,000 mg by mouth every 6 (six) hours as needed for moderate pain or mild pain.    [provider]  albuterol (VENTOLIN HFA) 108 (90 Base) MCG/ACT inhaler Inhale 2 puffs into the lungs every 6 (six) hours as needed for wheezing or shortness of breath. 10/01/20   [name], Peter M, MD  amLODipine (NORVASC) 5 MG tablet Take 1.5 tablets (7.5 mg total) by mouth at bedtime. 04/11/21   Ledora Bottcher, PA  aspirin 81 MG tablet Take 81 mg by mouth daily.    [provider]  atorvastatin (LIPITOR) 80 MG tablet Take 1 tablet (80 mg total) by mouth daily. 12/16/19   [facility], Tami Lin, PA  famotidine (PEPCID) 40 MG tablet Take 40 mg by mouth daily.    [provider]  finasteride (PROSCAR) 5 MG tablet Take 5 mg by mouth daily. 05/18/20   [provider]  Fluticasone  Propionate 0.05 % LOTN Apply topically once a week. 07/05/21   [provider]  furosemide (LASIX) 40 MG tablet Take 40 mg by mouth daily.    [provider]  gabapentin (NEURONTIN) 100 MG capsule Take 100 mg by mouth 2 (two) times daily. 10/01/20   [name], Peter M, MD  GEMTESA 75 MG TABS Take 1 tablet by mouth daily as needed. 04/20/21   [provider]  glimepiride (AMARYL) 2 MG tablet Take 4 mg by mouth daily with breakfast. 10/01/20   [name], Peter M, MD  glucose blood (ACCU-CHEK AVIVA PLUS) test strip  07/03/17   [provider]  Ketotifen Fumarate (ITCHY EYE DROPS OP) Apply 2 drops to eye daily as needed (itchyness).    [provider]  levothyroxine (SYNTHROID) 100 MCG tablet Take 1 tablet (100 mcg total) by mouth daily before breakfast. 10/01/20   [name], Peter M, MD  losartan (COZAAR) 100 MG tablet Take 100 mg by mouth daily. 05/31/18   [provider]  Magnesium 200 MG CHEW Chew by mouth.    [provider]  meclizine (ANTIVERT) 25 MG tablet Take 1 tablet (25 mg total) by mouth 3 (three) times daily as needed for dizziness. 10/01/20   [name], Peter M, MD  meloxicam (MOBIC) 15 MG tablet Take by mouth daily. 06/23/21   [provider]  metFORMIN (GLUCOPHAGE) 500 MG tablet Take 1 tablet (500 mg total) by mouth 2 (two) times daily with a meal. Resume on 04/13/21. 04/11/21   [facility], Tami Lin, PA  Multiple Vitamins-Minerals (ICAPS AREDS 2 PO) Take 1 Cartridge by mouth in the morning and at bedtime.    [provider]  mupirocin ointment (BACTROBAN) 2 % Apply topically as needed for rash. 08/31/21   [provider]  nitroGLYCERIN (NITROSTAT) 0.4 MG SL tablet Place 1 tablet (0.4 mg total) under the tongue every 5 (five) minutes as needed for chest pain. 06/05/15   [name], Peter M, MD  ondansetron (ZOFRAN) 4 MG tablet Take 1 tablet (4 mg total) by mouth every 8 (eight) hours as needed for nausea or vomiting. 10/01/20   [name], Peter M, MD  pantoprazole (PROTONIX) 40 MG tablet Take 1 tablet (40 mg total) by mouth daily. 06/07/21 07/07/21  Patrecia Pour, MD  Probiotic Product (PROBIOTIC-10 PO) Take by mouth.    [provider]  ranolazine (RANEXA) 500 MG 12 hr tablet Take 1 tablet (500 mg total) by mouth 2 (two) times daily. 10/04/21   [name], Peter M, MD  Turmeric (QC TUMERIC COMPLEX PO) Take by mouth.    [provider]  vitamin B-12 (CYANOCOBALAMIN) 1000 MCG tablet Take 1 tablet (1,000 mcg total) by mouth daily. 10/01/20   [name], Peter M, MD    Family History    Family History  Problem Relation Age of Onset   Cancer Mother        Leukemia   Hypertension Mother     Cancer Father        Colon   Heart attack Father        Had Several in his 17's   Hypertension Father    Diabetes Father    He indicated that his mother is deceased. He indicated that his father is deceased. He indicated that the status of his sister is unknown and reported the following: bypass surgery. He indicated that his maternal grandmother is deceased. He indicated that his maternal grandfather is deceased. He indicated that his paternal grandmother is deceased.  He indicated that his paternal grandfather is deceased.  Social History    Social History   Socioeconomic History   Marital status: Widowed    Spouse name: Not on file   Number of children: 3   Years of education: Not on file   Highest education level: Not on file  Occupational History   Occupation: state trooper    Comment: retired   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  Tobacco Use   Smoking status: Former    Packs/day: 3.00    Types: Cigarettes   Smokeless tobacco: Never  Vaping Use   Vaping Use: Never used  Substance and Sexual Activity   Alcohol use: No   Drug use: No   Sexual activity: Not Currently  Other Topics Concern   Not on file  Social History Narrative   Not on file   Social Determinants of Health   Financial Resource Strain: Not on file  Food Insecurity: Not on file  Transportation Needs: Not on file  Physical Activity: Not on file  Stress: Not on file  Social Connections: Not on file  Intimate Partner Violence: Not on file     Review of Systems    General:  No chills, fever, night sweats or weight changes.  Cardiovascular:  No chest pain, dyspnea on exertion, edema, orthopnea, palpitations, paroxysmal nocturnal dyspnea. Dermatological: No rash, lesions/masses Respiratory: No cough, dyspnea Urologic: No hematuria, dysuria Abdominal:   No nausea, vomiting, diarrhea, bright red blood per rectum, melena, or hematemesis Neurologic:  No visual changes, wkns, changes in mental status. All other  systems reviewed and are otherwise negative except as noted above.  Physical Exam    VS:  There were no vitals taken for this visit. , BMI There is no height or weight on file to calculate BMI. GEN: Well nourished, well developed, in no acute distress. HEENT: normal. Neck: Supple, no JVD, carotid bruits, or masses. Cardiac: RRR, no murmurs, rubs, or gallops. No clubbing, cyanosis, edema.  Radials/DP/PT 2+ and equal bilaterally.  Respiratory:  Respirations regular and unlabored, clear to auscultation bilaterally. GI: Soft, nontender, nondistended, BS + x 4. MS: no deformity or atrophy. Skin: warm and dry, no rash. Neuro:  Strength and sensation are intact. Psych: Normal affect.  Accessory Clinical Findings    Recent Labs: 06/04/2021: ALT 19; TSH 0.585 06/05/2021: B Natriuretic Peptide 343.9 06/06/2021: Hemoglobin 9.5; Platelets 178 06/07/2021: BUN 26; Creatinine, Ser 0.77; Potassium 3.7; Sodium 134   Recent Lipid Panel    Component Value Date/Time   CHOL 95 04/10/2021 0231   TRIG 102 04/10/2021 0231   HDL 27 (L) 04/10/2021 0231   CHOLHDL 3.5 04/10/2021 0231   VLDL 20 04/10/2021 0231   LDLCALC 48 04/10/2021 0231    No BP recorded.  {Refresh Note OR Click here to enter BP  :1}***    ECG personally reviewed by me today- *** - No acute changes  Assessment & Plan   1.  ***   Jossie Ng. Dynver Clemson NP-C     04/08/2022, 7:21 AM [address] Office [phone] Fax [phone]  Notice: This dictation was prepared with Dragon dictation along with smaller phrase technology. Any transcriptional errors that result from this process are unintentional and may not be corrected upon review.  I spent***minutes examining this patient, reviewing medications, and using patient centered shared decision making involving her cardiac care.  Prior to her visit I spent greater than 20 minutes reviewing her past medical history,  medications,  and prior cardiac tests.

## 2022-04-11 DIAGNOSIS — I70238 Atherosclerosis of native arteries of right leg with ulceration of other part of lower right leg: Secondary | ICD-10-CM | POA: Diagnosis not present

## 2022-04-11 DIAGNOSIS — S81801A Unspecified open wound, right lower leg, initial encounter: Secondary | ICD-10-CM | POA: Diagnosis not present

## 2022-04-11 DIAGNOSIS — I70221 Atherosclerosis of native arteries of extremities with rest pain, right leg: Secondary | ICD-10-CM | POA: Diagnosis not present

## 2022-04-11 DIAGNOSIS — L97819 Non-pressure chronic ulcer of other part of right lower leg with unspecified severity: Secondary | ICD-10-CM | POA: Diagnosis not present

## 2022-04-12 ENCOUNTER — Ambulatory Visit: Payer: Medicare PPO | Admitting: General Practice

## 2022-04-15 DIAGNOSIS — E11621 Type 2 diabetes mellitus with foot ulcer: Secondary | ICD-10-CM | POA: Diagnosis not present

## 2022-04-15 DIAGNOSIS — L97412 Non-pressure chronic ulcer of right heel and midfoot with fat layer exposed: Secondary | ICD-10-CM | POA: Diagnosis not present

## 2022-04-15 DIAGNOSIS — L97512 Non-pressure chronic ulcer of other part of right foot with fat layer exposed: Secondary | ICD-10-CM | POA: Diagnosis not present

## 2022-04-18 ENCOUNTER — Telehealth: Payer: Self-pay

## 2022-04-18 NOTE — Progress Notes (Signed)
Chief Complaint  Patient presents with   Follow-up    Rm 13. Accompanied by son. C/o circulation problems with feet. Was doing physical therapy before development of wound on right foot.    ASSESSMENT AND PLAN  Alexander Choi is a 88 y.o. male   1.  Peripheral neuropathy 2.  Syncope 3.  Diabetic ulcer of right foot 4.  Type 2 diabetes -Delayed wound healing to right foot has limited mobility, participation in physical therapy -For now, wishes to hold off on lumbar puncture, is going to discuss with his family members, unclear there has been any significant change (if protein was detected may consider IVIG) -He would benefit from PT once he is able to participate  -Encouraged to stay in close touch, will see back in 6 months with Dr. Krista Blue   -EMG nerve conduction study on August 11, 2021   evidence of peripheral neuropathy, with mixed axonal and demyelinating features, as evidenced by prolonged F-wave latency at bilateral ulnar motor responses, there was only mild active denervation at distal leg muscles  -Less likely to be explained by his diabetes, which has always been under good control, A1c was less than 7  -In the setting of bilateral foot drop, areflexia, positive Romberg signs, right first dorsal interossei muscle atrophy, weakness, also developed urinary urgency, occasionally incontinence  -Laboratory evaluation showed no treatable etiology  -MRI of the cervical spine showed mild degenerative changes, mild foraminal stenosis at C4-5  -MRI lumbar spine showed degenerative changes, mild foraminal stenosis at L3-4, L4-5   DIAGNOSTIC DATA (LABS, IMAGING, TESTING) - I reviewed patient records, labs, notes, testing and imaging myself where available.  MRI cervical spine without contrast demonstrating: - At C4-5 uncovertebral joint and facet hypertrophy with mild spinal stenosis and mild bilateral foraminal stenosis. - At C5-6 disc bulging with mild spinal stenosis and no  foraminal stenosis.  MRI lumbar spine (without) demonstrating: - At L4-5: disc bulging and facet hypertrophy with mild right and moderate left foraminal stenosis. - At L3-4: disc bulging and facet hypertrophy with mild biforaminal stenosis.  MEDICAL HISTORY:  Alexander Choi is a 88 year old male, seen in request by his primary care physician Dr. Nicoletta Dress, for evaluation of fall, following up for hospital discharge on June 07, 2021,  I reviewed and summarized the referring note. PMHX. HLD DM  CKD CAD Prostate Cancer  Patient is hard of hearing, patient uses walker at home, I reviewed hospital record on December 9-12 2022, after napping in his recliner for few hours, while trying to get up, he felt lightheaded, his l legs gave out underneath him, he fell, could not get up, no loss of consciousness, EMS was called, was reported that he was confused,  I personally reviewed CT head, MRI of the brain without contrast on June 04, 2021: No acute abnormality, no evidence of stroke, mild small vessel disease  CT angiogram of head and neck showed no large vessel disease Laboratory evaluation showed A1c 7.0, CBC showed hemoglobin of 9.5, normal BMP, creatinine of 0.86, negative troponin, normal TSH, ammonia,  He does have long history of diabetes, bilateral feet numbness, 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  As evident by prolonged bilateral ulnar  F-wave latency, with only mild active denervation at distal left muscles, this would not explain explained by his well-controlled diabetes, A1c was always under 7  On examination he has distal weakness, bilateral foot drop, positive Romberg signs, areflexia, also have urinary urgency, occasionally incontinence,  UPDATE October 18 2021: He lives by himself, has two sons live close to him, did not have MRI cervical and lumbar  I failed to contact his 2 sons by calling listed phone numbers Laboratory evaluation showed normal ANA, RPR, ESR C-reactive protein, normal protein electrophoresis  Update April 19, 2022 SS: Has 2 poorly healing ulcer to right foot since April. Going to wound center. He moves slow no falls. Still lives alone. Uses cane. Still urinary incontinence, did pelvic floor therapy with some benefit. Feels legs are getting weaker. Had to stop PT in August due to wound, it was helping with balance, leg strengthening.    MRI cervical spine showed mild degenerative changes there was no evidence of spinal cord compression, there was mild spinal stenosis at C4-5  MRI lumbar spine showed degenerative changes, no significant stenosis  PHYSICAL EXAM:   Vitals:   04/19/22 1432  BP: (!) 136/56  Pulse: 80  Weight: 187 lb (84.8 kg)  Height: 5' 10"  (1.778 m)   Not recorded    Physical Exam  General: The patient is alert and cooperative at the time of the examination.  Hard of hearing.  Skin: Swelling to right lower extremity, wearing soft cast with post-op shoe for wound  Neurologic Exam  Mental status: The patient is alert and oriented x 3 at the time of the examination. The patient has apparent normal recent and remote memory, with an apparently normal attention span and concentration ability.  Cranial nerves: Facial symmetry is present. Speech is normal, no aphasia or dysarthria is noted. Extraocular movements are full. Visual fields are full.  Motor: Right first dorsal  interossei muscle mild atrophy, mild finger open weakness bilaterally, mild bilateral hip flexion weakness, right leg weaker than right from soft cast, mild to moderate right ankle dorsiflexion weakness  Sensory examination: Length dependent soft touch sensation to upper and lower extremities to knee level/wrist level  Coordination: The patient has good finger-nose-finger bilaterally, difficulty performing heel-to-shin  Gait and station: Has to push off from seated position to stand, gait is wide-based, cautious, uses a cane, gait is unsteady with postop shoe soft cast to right foot, has a tendency to lean backwards, walks flat foot  Reflexes: are decreased to absent  REVIEW OF SYSTEMS:  Full 14 system review of systems performed and notable only for as above  See HPI  ALLERGIES: Allergies  Allergen Reactions   Ace Inhibitors Other (See Comments)    unknown   Nitrates, Organic Swelling    Swelling in hands   Shellfish Allergy Nausea Only    Pt states scallops-not all shellfish-GI symptoms/ nausea -50+ years ago   Penicillins Rash    HOME MEDICATIONS: Current Outpatient Medications  Medication Sig Dispense Refill   Accu-Chek Softclix Lancets lancets      acetaminophen (TYLENOL) 500 MG tablet Take 1,000 mg by mouth every 6 (six) hours as needed for moderate pain or mild pain.     albuterol (VENTOLIN HFA) 108 (90 Base) MCG/ACT inhaler Inhale  2 puffs into the lungs every 6 (six) hours as needed for wheezing or shortness of breath. 8 g 2   amLODipine (NORVASC) 5 MG tablet Take 1.5 tablets (7.5 mg total) by mouth at bedtime. 135 tablet 3   aspirin 81 MG tablet Take 81 mg by mouth daily.     atorvastatin (LIPITOR) 80 MG tablet Take 1 tablet (80 mg total) by mouth daily. 90 tablet 3   famotidine (PEPCID) 40 MG tablet Take 40 mg by mouth daily.     ferrous sulfate 324 (65 Fe) MG TBEC Take 1 tablet by mouth daily.     finasteride (PROSCAR) 5 MG tablet Take 5 mg by mouth daily.      Fluticasone Propionate 0.05 % LOTN Apply topically once a week.     furosemide (LASIX) 40 MG tablet Take 40 mg by mouth as needed.     gabapentin (NEURONTIN) 100 MG capsule Take 100 mg by mouth 2 (two) times daily.     GEMTESA 75 MG TABS Take 1 tablet by mouth daily as needed.     glimepiride (AMARYL) 2 MG tablet Take 4 mg by mouth daily with breakfast.     glucose blood (ACCU-CHEK AVIVA PLUS) test strip      Ketotifen Fumarate (ITCHY EYE DROPS OP) Apply 2 drops to eye daily as needed (itchyness).     levothyroxine (SYNTHROID) 100 MCG tablet Take 1 tablet (100 mcg total) by mouth daily before breakfast.     losartan (COZAAR) 100 MG tablet Take 100 mg by mouth daily.     Magnesium 200 MG CHEW Chew by mouth.     meclizine (ANTIVERT) 25 MG tablet Take 1 tablet (25 mg total) by mouth 3 (three) times daily as needed for dizziness. 30 tablet 0   metFORMIN (GLUCOPHAGE) 500 MG tablet Take 1 tablet (500 mg total) by mouth 2 (two) times daily with a meal. Resume on 04/13/21.     mupirocin ointment (BACTROBAN) 2 % Apply topically as needed for rash.     nitroGLYCERIN (NITROSTAT) 0.4 MG SL tablet Place 1 tablet (0.4 mg total) under the tongue every 5 (five) minutes as needed for chest pain. 90 tablet 3   ondansetron (ZOFRAN) 4 MG tablet Take 1 tablet (4 mg total) by mouth every 8 (eight) hours as needed for nausea or vomiting. 20 tablet 0   Probiotic Product (PROBIOTIC-10 PO) Take by mouth.     ranolazine (RANEXA) 500 MG 12 hr tablet Take 1 tablet (500 mg total) by mouth 2 (two) times daily. 180 tablet 3   Turmeric (QC TUMERIC COMPLEX PO) Take by mouth.     vitamin B-12 (CYANOCOBALAMIN) 1000 MCG tablet Take 1 tablet (1,000 mcg total) by mouth daily.     pantoprazole (PROTONIX) 40 MG tablet Take 1 tablet (40 mg total) by mouth daily. 30 tablet 0   No current facility-administered medications for this visit.    PAST MEDICAL HISTORY: Past Medical History:  Diagnosis Date   Cancer [facility])    Chronic kidney  disease    kidney stones   Coronary artery disease    with CABG in 2006 with LIMA to LAD, SVG to mid and distal OM, SVG to AM. Normal Myoview in May of 2013   Diabetes mellitus    Type 2   GERD (gastroesophageal reflux disease)    Hematuria    Hypercholesterolemia    Hypertension    Irritable bowel syndrome    Loose stools    Prostate cancer (  [place])    Urinary obstruction     PAST SURGICAL HISTORY: Past Surgical History:  Procedure Laterality Date   CHOLECYSTECTOMY     CORONARY ARTERY BYPASS GRAFT  2006   LEFT HEART CATH AND CORS/GRAFTS ANGIOGRAPHY N/A 04/09/2021   Procedure: LEFT HEART CATH AND CORS/GRAFTS ANGIOGRAPHY;  Surgeon: [name], Peter M, MD;  Location: [place] [facility];  Service: Cardiovascular;  Laterality: N/A;   LEFT HEART CATHETERIZATION WITH CORONARY/GRAFT ANGIOGRAM N/A 04/11/2014   Procedure: LEFT HEART CATHETERIZATION WITH [name];  Surgeon: Peter M [name], MD;  Location: [facility] CATH LAB;  Service: Cardiovascular;  Laterality: N/A;   OTHER SURGICAL HISTORY     bowel obstruction   RADIOACTIVE SEED IMPLANT     for prostate cancer   RIGHT/LEFT HEART CATH AND CORONARY/GRAFT ANGIOGRAPHY N/A 12/12/2019   Procedure: RIGHT/LEFT HEART CATH AND CORONARY/GRAFT ANGIOGRAPHY;  Surgeon: [name], Peter M, MD;  Location: [place] [facility];  Service: Cardiovascular;  Laterality: N/A;   SKIN CANCER EXCISION     TEMPORARY PACEMAKER N/A 12/13/2019   Procedure: TEMPORARY PACEMAKER;  Surgeon: [name], Peter M, MD;  Location: [place] [facility];  Service: Cardiovascular;  Laterality: N/A;    FAMILY HISTORY: Family History  Problem Relation Age of Onset   Cancer Mother        Leukemia   Hypertension Mother    Cancer Father        Colon   Heart attack Father        Had Several in his 62's   Hypertension Father    Diabetes Father     SOCIAL HISTORY: Social History   Socioeconomic History   Marital status: Widowed    Spouse name: Not on file   Number of children:  3   Years of education: Not on file   Highest education level: Not on file  Occupational History   Occupation: state trooper    Comment: retired   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  Tobacco Use   Smoking status: Former    Packs/day: 3.00    Types: Cigarettes   Smokeless tobacco: Never  Vaping Use   Vaping Use: Never used  Substance and Sexual Activity   Alcohol use: No   Drug use: No   Sexual activity: Not Currently  Other Topics Concern   Not on file  Social History Narrative   Not on file   Social Determinants of Health   Financial Resource Strain: Not on file  Food Insecurity: Not on file  Transportation Needs: Not on file  Physical Activity: Not on file  Stress: Not on file  Social Connections: Not on file  Intimate Partner Violence: Not on file   Butler Denmark, Laqueta Jean, [facility] [address] [place], [place] [address] [phone]

## 2022-04-18 NOTE — Patient Outreach (Signed)
  Care Coordination   04/18/2022 Name: Alexander Choi MRN: [id number] DOB: 09/03/1932   Care Coordination Outreach Attempts:  An unsuccessful telephone outreach was attempted today to offer the patient information about available care coordination services as a benefit of their health plan.   Follow Up Plan:  Additional outreach attempts will be made to offer the patient care coordination information and services.   Encounter Outcome:  No Answer  Care Coordination Interventions Activated:  No   Care Coordination Interventions:  No, not indicated    Tomasa Rand, RN, BSN, CEN [facility] [employer] [phone]

## 2022-04-19 ENCOUNTER — Encounter: Payer: Self-pay | Admitting: Neurology

## 2022-04-19 ENCOUNTER — Ambulatory Visit: Payer: Medicare PPO | Admitting: Neurology

## 2022-04-19 VITALS — BP 136/56 | HR 80 | Ht 70.0 in | Wt 187.0 lb

## 2022-04-19 DIAGNOSIS — R269 Unspecified abnormalities of gait and mobility: Secondary | ICD-10-CM | POA: Diagnosis not present

## 2022-04-19 DIAGNOSIS — G6289 Other specified polyneuropathies: Secondary | ICD-10-CM | POA: Diagnosis not present

## 2022-04-19 NOTE — Patient Instructions (Addendum)
Please discuss considering lumbar puncture for further evaluation for weakness, gait, balance issues, discuss with family   EMG nerve conduction study on August 11, 2021   evidence of peripheral neuropathy, with mixed axonal and demyelinating features, as evidenced by prolonged F-wave latency at bilateral ulnar motor responses, there was only mild active denervation at distal leg muscle, Less likely to be explained by his diabetes, which has always been under good control, A1c was less than 7  Closely monitor symptoms, return back in 6 months

## 2022-04-20 NOTE — Progress Notes (Signed)
Chart reviewed, agree above plan ?

## 2022-04-22 DIAGNOSIS — S90821A Blister (nonthermal), right foot, initial encounter: Secondary | ICD-10-CM | POA: Diagnosis not present

## 2022-04-22 DIAGNOSIS — Z7984 Long term (current) use of oral hypoglycemic drugs: Secondary | ICD-10-CM | POA: Diagnosis not present

## 2022-04-22 DIAGNOSIS — M25376 Other instability, unspecified foot: Secondary | ICD-10-CM | POA: Diagnosis not present

## 2022-04-22 DIAGNOSIS — L97512 Non-pressure chronic ulcer of other part of right foot with fat layer exposed: Secondary | ICD-10-CM | POA: Diagnosis not present

## 2022-04-22 DIAGNOSIS — M216X1 Other acquired deformities of right foot: Secondary | ICD-10-CM | POA: Diagnosis not present

## 2022-04-22 DIAGNOSIS — E11621 Type 2 diabetes mellitus with foot ulcer: Secondary | ICD-10-CM | POA: Diagnosis not present

## 2022-04-22 DIAGNOSIS — L97519 Non-pressure chronic ulcer of other part of right foot with unspecified severity: Secondary | ICD-10-CM | POA: Diagnosis not present

## 2022-04-22 DIAGNOSIS — M25374 Other instability, right foot: Secondary | ICD-10-CM | POA: Diagnosis not present

## 2022-04-22 DIAGNOSIS — L97412 Non-pressure chronic ulcer of right heel and midfoot with fat layer exposed: Secondary | ICD-10-CM | POA: Diagnosis not present

## 2022-04-26 ENCOUNTER — Encounter: Payer: Self-pay | Admitting: General Practice

## 2022-04-26 ENCOUNTER — Ambulatory Visit: Payer: Medicare PPO | Attending: Cardiology | Admitting: Student

## 2022-04-26 VITALS — BP 124/54 | HR 77 | Ht 70.0 in | Wt 193.0 lb

## 2022-04-26 DIAGNOSIS — E785 Hyperlipidemia, unspecified: Secondary | ICD-10-CM

## 2022-04-26 DIAGNOSIS — R0609 Other forms of dyspnea: Secondary | ICD-10-CM

## 2022-04-26 DIAGNOSIS — I4719 Other supraventricular tachycardia: Secondary | ICD-10-CM

## 2022-04-26 DIAGNOSIS — I10 Essential (primary) hypertension: Secondary | ICD-10-CM | POA: Diagnosis not present

## 2022-04-26 DIAGNOSIS — I739 Peripheral vascular disease, unspecified: Secondary | ICD-10-CM

## 2022-04-26 DIAGNOSIS — E119 Type 2 diabetes mellitus without complications: Secondary | ICD-10-CM | POA: Diagnosis not present

## 2022-04-26 DIAGNOSIS — I5189 Other ill-defined heart diseases: Secondary | ICD-10-CM

## 2022-04-26 DIAGNOSIS — I2510 Atherosclerotic heart disease of native coronary artery without angina pectoris: Secondary | ICD-10-CM | POA: Diagnosis not present

## 2022-04-26 NOTE — Patient Instructions (Signed)
Medication Instructions:  Reduce Lasix 40 mg ( Take 1 Tablet Every Other Day). *If you need a refill on your cardiac medications before your next appointment, please call your pharmacy*   Lab Work: No Labs If you have labs (blood work) drawn today and your tests are completely normal, you will receive your results only by: [facility] (if you have MyChart) OR A paper copy in the mail If you have any lab test that is abnormal or we need to change your treatment, we will call you to review the results.   Testing/Procedures: No Testing   Follow-Up: At [facility], you and your health needs are our priority.  As part of our continuing mission to provide you with exceptional heart care, we have created designated Provider Care Teams.  These Care Teams include your primary Cardiologist (physician) and Advanced Practice Providers (APPs -  Physician Assistants and Nurse Practitioners) who all work together to provide you with the care you need, when you need it.  We recommend signing up for the patient portal called "MyChart".  Sign up information is provided on this After Visit Summary.  MyChart is used to connect with patients for Virtual Visits (Telemedicine).  Patients are able to view lab/test results, encounter notes, upcoming appointments, etc.  Non-urgent messages can be sent to your provider as well.   To learn more about what you can do with MyChart, go to [email or web address].    Your next appointment:   3-62months)  The format for your next appointment:   In Person  Provider:   Peter JMartinique MD

## 2022-04-26 NOTE — Progress Notes (Unsigned)
Cardiology Clinic Note   Patient Name: Alexander Choi Date of Encounter: 04/26/2022  Primary Care Provider:  Nicoletta Dress, MD Primary Cardiologist:  Peter [name], MD  Patient Profile    Alexander Choi is an 88 year-old male with a past medical history of CAD status post CABG x 3 in 2006, PAD, paroxysmal atrial tachycardia, hypertension, hyperlipidemia, T2DM, GERD arthritis, prostate cancer, and IBS who presents to the clinic today for 47-monthfollow-up of chronic cardiac conditions.  Past Medical History    Past Medical History:  Diagnosis Date   Cancer ([facility]    Chronic kidney disease    kidney stones   Coronary artery disease    with CABG in 2006 with LIMA to LAD, SVG to mid and distal OM, SVG to AM. Normal Myoview in May of 2013   Diabetes mellitus    Type 2   GERD (gastroesophageal reflux disease)    Hematuria    Hypercholesterolemia    Hypertension    Irritable bowel syndrome    Loose stools    [place] ([facility]    Urinary obstruction    Past Surgical History:  Procedure Laterality Date   CHOLECYSTECTOMY     CORONARY ARTERY BYPASS GRAFT  2006   LEFT HEART CATH AND CORS/GRAFTS ANGIOGRAPHY N/A 04/09/2021   Procedure: LEFT HEART CATH AND CORS/GRAFTS ANGIOGRAPHY;  Surgeon: JMartinique Peter M, MD;  [facility];  Service: Cardiovascular;  Laterality: N/A;   LEFT HEART CATHETERIZATION WITH CORONARY/GRAFT ANGIOGRAM N/A 04/11/2014   Procedure: LEFT HEART CATHETERIZATION WITH CBeatrix Fetters  Surgeon: Peter M JMartinique MD;  [facility];  Service: Cardiovascular;  Laterality: N/A;   OTHER SURGICAL HISTORY     bowel obstruction   RADIOACTIVE SEED IMPLANT     for prostate cancer   RIGHT/LEFT HEART CATH AND CORONARY/GRAFT ANGIOGRAPHY N/A 12/12/2019   Procedure: RIGHT/LEFT HEART CATH AND CORONARY/GRAFT ANGIOGRAPHY;  Surgeon: JMartinique Peter M, MD;  [facility];  Service: Cardiovascular;  Laterality: N/A;   SKIN CANCER  EXCISION     TEMPORARY PACEMAKER N/A 12/13/2019   Procedure: TEMPORARY PACEMAKER;  Surgeon: JMartinique Peter M, MD;  [facility];  Service: Cardiovascular;  Laterality: N/A;    Allergies  Allergies  Allergen Reactions   Ace Inhibitors Other (See Comments)    unknown   Nitrates, Organic Swelling    Swelling in hands   Shellfish Allergy Nausea Only    Pt states scallops-not all shellfish-GI symptoms/ nausea -50+ years ago   Penicillins Rash    History of Present Illness    Mr. TShawlerhas a past medical history of: CAD. CABG x3 2006: LIMA to LAD, SVG sequentially to mid and distal OM, SVG to acute marginal branch of the RCA. Right and left heart catheterization 12/12/2019: Mid LM 35% stenosed, ostial to mid LAD 100% stenosed, proximal Cx 99% stenosed, proximal to mid CX 90% stenosed, first marginal 100% stenosed, second marginal 75% stenosed, proximal to distal RCA 100% stenosed, LIMA normal in caliber without disease, distal LAD 1 60% stenosed, distal LAD 2 90% stenosed, SVG graft is large with minimal luminal irregularities, SVG graft normal in caliber with mild disease.  Plan for staged PCI of LCx. Left heart catheterization 12/13/2019: Unsuccessful PCI of LCx due to inability to cross lesion with wire. Left heart catheterization 04/09/2021: Severe three-vessel obstructive CAD.  Continued patency of bypass grafts including LIMA to LAD, SVG to OM1, SVG to RV marginal.  Continue medical therapy. Echo 04/10/2021: EF 60 to 65%.  Grade I DD.  Trivial mitral valve regurgitation.  Mild to moderate aortic valve sclerosis/calcification without stenosis. PAD. Vascular ultrasound aorta/IVC/iliacs 10/13/2020: Calcific atherosclerosis throughout the aorta and bilateral iliac arteries. Vascular ultrasound lower extremities 10/13/2020: Right-total occlusion noted in the superficial femoral artery and or AK popliteal artery.  Left-total occlusion noted in the superficial femoral artery and/or  AK popliteal artery.  30 to 49% stenosis of the common femoral artery.  Suspected occlusion of the distal anterior tibial artery. Paroxysmal atrial tachycardia. Event monitor 12/16/2019: Normal sinus rhythm, occasional PACs, occasional brief runs of PAT with the longest lasting 18.5 seconds. Hypertension. Hyperlipidemia. Last lipid panel 04/10/2021: LDL 48, HDL 27, triglycerides 102, total cholesterol 95. T2DM. Last A1c 06/05/2021: 7.  He had an office visit on 09/21/2021 with Diona Browner, NP.  At that time he was doing well and all medications were continued.  He was seen by Dr. Gwenlyn Found on 12/22/2021 in the office for evaluation of his PAD. It was decided that no invasive treatment would occur as patient was not experiencing life limiting claudication.  Today, patient has no complaints of chest pain, shortness of breath, lower extremity edema, or palpitations.  He reports DOE that is normal for him over the last few years.  He was recently started on an inhaler and feels that has improved.  Most recently he has been dealing with a wound on the bottom and side of his right foot.  He reports it began as an abscess that needed to be lanced but has not been healing.  He does being seen regularly at wound care and has been on several courses of antibiotic.  He has a walking boot in place and ambulates with a single-point cane.  Wound care is through [facility] and they referred him to a vascular surgeon.  He denies claudication but does experience leg cramps at night on occasion.  He is also under the care of neurology.  He reports they want to do a lumbar puncture but he is uncertain for what reason but does report absent reflexes in his lower extremities.  He is not sure if he wants to undergo that procedure and defer to for the time being.  He will return to neurology in 6 months.  Patient lives alone.  He is mostly independent with his ADLs.  He has 2 sons who eat dinner with him every  night.  He has a home health nurse that assists him with setting up his medications.  He reports he has not been taking his Lasix for the last 3 to 4 days secondary to frequent urination.  He states he plans to start taking it again as he has noticed his weight has gone up.  Home Medications    Current Meds  Medication Sig   Accu-Chek Softclix Lancets lancets    acetaminophen (TYLENOL) 500 MG tablet Take 1,000 mg by mouth every 6 (six) hours as needed for moderate pain or mild pain.   albuterol (VENTOLIN HFA) 108 (90 Base) MCG/ACT inhaler Inhale 2 puffs into the lungs every 6 (six) hours as needed for wheezing or shortness of breath.   amLODipine (NORVASC) 5 MG tablet Take 1.5 tablets (7.5 mg total) by mouth at bedtime.   aspirin 81 MG tablet Take 81 mg by mouth daily.   atorvastatin (LIPITOR) 80 MG tablet Take 1 tablet (80 mg total) by mouth daily.   BREZTRI AEROSPHERE 160-9-4.8 MCG/ACT AERO  Inhale 2 puffs into the lungs 2 (two) times daily.   clindamycin (CLEOCIN) 300 MG capsule Take 300 mg by mouth 3 (three) times daily.   famotidine (PEPCID) 40 MG tablet Take 40 mg by mouth daily.   ferrous sulfate 324 (65 Fe) MG TBEC Take 1 tablet by mouth daily.   finasteride (PROSCAR) 5 MG tablet Take 5 mg by mouth daily.   Fluticasone Propionate 0.05 % LOTN Apply topically once a week.   furosemide (LASIX) 40 MG tablet Take 40 mg by mouth every other day.   gabapentin (NEURONTIN) 100 MG capsule Take 100 mg by mouth 2 (two) times daily.   GEMTESA 75 MG TABS Take 1 tablet by mouth daily as needed.   glimepiride (AMARYL) 2 MG tablet Take 4 mg by mouth daily with breakfast.   glucose blood (ACCU-CHEK AVIVA PLUS) test strip    Ketotifen Fumarate (ITCHY EYE DROPS OP) Apply 2 drops to eye daily as needed (itchyness).   levothyroxine (SYNTHROID) 100 MCG tablet Take 1 tablet (100 mcg total) by mouth daily before breakfast.   losartan (COZAAR) 100 MG tablet Take 100 mg by mouth daily.   Magnesium 200 MG CHEW  Chew by mouth.   meclizine (ANTIVERT) 25 MG tablet Take 1 tablet (25 mg total) by mouth 3 (three) times daily as needed for dizziness.   metFORMIN (GLUCOPHAGE) 500 MG tablet Take 1 tablet (500 mg total) by mouth 2 (two) times daily with a meal. Resume on 04/13/21.   mupirocin ointment (BACTROBAN) 2 % Apply topically as needed for rash.   nitroGLYCERIN (NITROSTAT) 0.4 MG SL tablet Place 1 tablet (0.4 mg total) under the tongue every 5 (five) minutes as needed for chest pain.   ondansetron (ZOFRAN) 4 MG tablet Take 1 tablet (4 mg total) by mouth every 8 (eight) hours as needed for nausea or vomiting.   Probiotic Product (PROBIOTIC-10 PO) Take by mouth.   ranolazine (RANEXA) 500 MG 12 hr tablet Take 1 tablet (500 mg total) by mouth 2 (two) times daily.   Turmeric (QC TUMERIC COMPLEX PO) Take by mouth.   vitamin B-12 (CYANOCOBALAMIN) 1000 MCG tablet Take 1 tablet (1,000 mcg total) by mouth daily.    Family History    Family History  Problem Relation Age of Onset   Cancer Mother        Leukemia   Hypertension Mother    Cancer Father        Colon   Heart attack Father        Had Several in his 62's   Hypertension Father    Diabetes Father    He indicated that his mother is deceased. He indicated that his father is deceased. He indicated that the status of his sister is unknown and reported the following: bypass surgery. He indicated that his maternal grandmother is deceased. He indicated that his maternal grandfather is deceased. He indicated that his paternal grandmother is deceased. He indicated that his paternal grandfather is deceased.   Social History    Social History   Socioeconomic History   Marital status: Widowed    Spouse name: Not on file   Number of children: 3   Years of education: Not on file   Highest education level: Not on file  Occupational History   Occupation: state trooper    Comment: retired   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  Tobacco Use   Smoking status: Former     Packs/day: 3.00    Types: Cigarettes   Smokeless  tobacco: Never  Vaping Use   Vaping Use: Never used  Substance and Sexual Activity   Alcohol use: No   Drug use: No   Sexual activity: Not Currently  Other Topics Concern   Not on file  Social History Narrative   Not on file   Social Determinants of Health   Financial Resource Strain: Not on file  Food Insecurity: Not on file  Transportation Needs: Not on file  Physical Activity: Not on file  Stress: Not on file  Social Connections: Not on file  Intimate Partner Violence: Not on file     Review of Systems    General:  No chills, fever, night sweats.  Increased weight.  Cardiovascular:  No chest pain, dyspnea on exertion, edema, orthopnea, palpitations, paroxysmal nocturnal dyspnea. Dermatological: No rash. Wound to bottom and side of right foot.  Respiratory: No cough, dyspnea Urologic: No hematuria, dysuria Abdominal:   No nausea, vomiting, diarrhea, bright red blood per rectum, melena, or hematemesis Neurologic:  No visual changes, weakness, changes in mental status. All other systems reviewed and are otherwise negative except as noted above.  Physical Exam    VS:  BP (!) 124/54 (BP Location: Right Arm, Patient Position: Sitting, Cuff Size: Normal)   Pulse 77   Ht '5\' 10"'$  (1.778 m)   Wt 193 lb (87.5 kg)   SpO2 95%   BMI 27.69 kg/m  , BMI Body mass index is 27.69 kg/m. GEN: Well nourished, well developed, in no acute distress. HEENT: Normal. Neck: Supple, no JVD, carotid bruits, or masses. Cardiac: RRR, no murmurs, rubs, or gallops. No clubbing, cyanosis, edema.  Radials 2+ and equal bilaterally. Absent pedal pulses on the left and unable to assess on the right secondary to dressing and boot in place. Left lower extremity is warm to touch.  Respiratory:  Respirations regular and unlabored, clear to auscultation bilaterally. GI: Soft, nontender, nondistended. MS: No deformity or atrophy. Skin: Warm and dry, no  rash. Neuro: Strength and sensation are intact. Psych: Normal affect.  Accessory Clinical Findings    Recent Labs: 06/04/2021: ALT 19; TSH 0.585 06/05/2021: B Natriuretic Peptide 343.9 06/06/2021: Hemoglobin 9.5; Platelets 178 06/07/2021: BUN 26; Creatinine, Ser 0.77; Potassium 3.7; Sodium 134   Recent Lipid Panel    Component Value Date/Time   CHOL 95 04/10/2021 0231   TRIG 102 04/10/2021 0231   HDL 27 (L) 04/10/2021 0231   CHOLHDL 3.5 04/10/2021 0231   VLDL 20 04/10/2021 0231   LDLCALC 48 04/10/2021 0231         ECG is not indicated today.  1}      Assessment & Plan   CAD.  Status post CABG x3.  Last heart catheterization 04/09/2021 which showed severe three-vessel obstructive CAD and patent grafts.  Patient denies chest pain or shortness of breath today.  We will continue medical management with amlodipine, aspirin, atorvastatin, losartan, and Ranexa. Grade 1 diastolic dysfunction.  Last echo 04/10/2021 showed EF 60 to 65%.  Normal left ventricular function. Trivial mitral valve regurgitation.  Mild to moderate aortic valve sclerosis/calcification without stenosis.  He is euvolemic today.  He has not taken his Lasix in the last 3 to 4 days.  He does have brisk diuresis when he is on it.  He feels his weight is up so he plans on starting tomorrow.  Will have patient reduce his Lasix dose to 40 mg every other day.  He will go back to daily dosing if he notices an increase in  his weight or edema in his lower extremities. PAD.  Patient was evaluated by Dr. Gwenlyn Found in June 2023.  At that time it was decided to forego intervention as patient was not experiencing life limiting claudication.  Since that time he has been treated at [facility] for a wound to the bottom inside of his right foot.  They are [facility]  Offered to schedule patient with Dr. Gwenlyn Found.  He declines at this time stating he wants to stay within the [facility]  system given that is where they are managing his wound. Atrial tachycardia.  Stable.  Patient denies palpitations or racing heart rate. Hypertension.  BP today 124/54.  He denies headaches or dizziness.  Continue amlodipine, Lasix, and losartan. Hyperlipidemia. Last lipid panel 04/10/2021: LDL 48, HDL 27, triglycerides 102, total cholesterol 95.  LDL at goal.  Continue Lipitor 80 mg daily. DOE.  Patient reports this has been an issue for him for years.  He recently began using an inhaler and feels that it is improved. T2DM.  Last A1c 06/05/2021: 7.0.  Managed by PCP.   Disposition: Reduce Lasix to 40 mg every other day.  Return to Dr. [name] in 3 to 4 months or sooner as needed.   Justice [place]. Krisinda Giovanni, NP-C     04/26/2022, 11:57 AM [place] [address] Office [phone] Fax [phone]   I spent 12 minutes examining this patient, reviewing medications, and using patient centered shared decision making involving her cardiac care.  Prior to her visit I spent greater than 20 minutes reviewing her past medical history,  medications, and prior cardiac tests.

## 2022-04-27 ENCOUNTER — Telehealth: Payer: Self-pay

## 2022-04-27 NOTE — Patient Outreach (Signed)
  Care Coordination   Initial Visit Note   04/27/2022 Name: Alexander Choi MRN: [id number] DOB: 25-Oct-1932  Alexander Choi is a 88 y.o. year old male who sees Alexander Dress, MD for primary care. I spoke with  Alexander Choi by phone today.  What matters to the patients health and wellness today?  Placed call to patient today and reviewed and offer [facility] care coordination program. Patient has provided verbal consent. Patient reports that he has a non healing wound on his foot.   Initial visit planned for 05/05/2022   SDOH assessments and interventions completed:  No     Care Coordination Interventions Activated:  Yes  Care Coordination Interventions:  Yes, provided   Follow up plan: Follow up call scheduled for 05/05/2022    Encounter Outcome:  Pt. Scheduled   Alexander Rand, RN, BSN, [place] Coordinator [phone]

## 2022-04-29 DIAGNOSIS — S91311D Laceration without foreign body, right foot, subsequent encounter: Secondary | ICD-10-CM | POA: Diagnosis not present

## 2022-04-29 DIAGNOSIS — L97512 Non-pressure chronic ulcer of other part of right foot with fat layer exposed: Secondary | ICD-10-CM | POA: Diagnosis not present

## 2022-04-29 DIAGNOSIS — E11621 Type 2 diabetes mellitus with foot ulcer: Secondary | ICD-10-CM | POA: Diagnosis not present

## 2022-04-29 DIAGNOSIS — L97412 Non-pressure chronic ulcer of right heel and midfoot with fat layer exposed: Secondary | ICD-10-CM | POA: Diagnosis not present

## 2022-05-03 DIAGNOSIS — E785 Hyperlipidemia, unspecified: Secondary | ICD-10-CM | POA: Diagnosis not present

## 2022-05-03 DIAGNOSIS — I10 Essential (primary) hypertension: Secondary | ICD-10-CM | POA: Diagnosis not present

## 2022-05-03 DIAGNOSIS — I25810 Atherosclerosis of coronary artery bypass graft(s) without angina pectoris: Secondary | ICD-10-CM | POA: Diagnosis not present

## 2022-05-03 DIAGNOSIS — E1140 Type 2 diabetes mellitus with diabetic neuropathy, unspecified: Secondary | ICD-10-CM | POA: Diagnosis not present

## 2022-05-03 DIAGNOSIS — Z139 Encounter for screening, unspecified: Secondary | ICD-10-CM | POA: Diagnosis not present

## 2022-05-03 DIAGNOSIS — E039 Hypothyroidism, unspecified: Secondary | ICD-10-CM | POA: Diagnosis not present

## 2022-05-03 DIAGNOSIS — Z23 Encounter for immunization: Secondary | ICD-10-CM | POA: Diagnosis not present

## 2022-05-03 DIAGNOSIS — E1165 Type 2 diabetes mellitus with hyperglycemia: Secondary | ICD-10-CM | POA: Diagnosis not present

## 2022-05-03 DIAGNOSIS — D638 Anemia in other chronic diseases classified elsewhere: Secondary | ICD-10-CM | POA: Diagnosis not present

## 2022-05-05 ENCOUNTER — Ambulatory Visit: Payer: Self-pay

## 2022-05-05 DIAGNOSIS — S91311D Laceration without foreign body, right foot, subsequent encounter: Secondary | ICD-10-CM | POA: Diagnosis not present

## 2022-05-05 DIAGNOSIS — E11621 Type 2 diabetes mellitus with foot ulcer: Secondary | ICD-10-CM | POA: Diagnosis not present

## 2022-05-05 DIAGNOSIS — L97412 Non-pressure chronic ulcer of right heel and midfoot with fat layer exposed: Secondary | ICD-10-CM | POA: Diagnosis not present

## 2022-05-05 DIAGNOSIS — L97512 Non-pressure chronic ulcer of other part of right foot with fat layer exposed: Secondary | ICD-10-CM | POA: Diagnosis not present

## 2022-05-05 NOTE — Patient Outreach (Signed)
  Care Coordination   Initial Visit Note   05/05/2022 Name: Alexander Choi MRN: [id number] DOB: [birth year]  Alexander Choi is a 88 y.o. year old male who sees Nicoletta Dress, MD for primary care. I spoke with  [name] by phone today.  What matters to the patients health and wellness today? " I feel weak"    Goals Addressed               This Visit's Progress     I have no energy and feel weak (pt-stated)        Care Coordination Interventions: Advised patient to get a probiotic Reviewed scheduled/upcoming provider appointments including case manager Discussed plans with patient for ongoing care management follow up and provided patient with direct contact information for care management team Screening for signs and symptoms of depression related to chronic disease state  Assessed social determinant of health barriers Reviewed importance of eating and drinking well.           SDOH assessments and interventions completed:  Yes  SDOH Interventions Today    Flowsheet Row Most Recent Value  SDOH Interventions   Food Insecurity Interventions Intervention Not Indicated  Housing Interventions Intervention Not Indicated  Transportation Interventions Intervention Not Indicated  Utilities Interventions Intervention Not Indicated  Alcohol Usage Interventions Intervention Not Indicated (Score <7)  Financial Strain Interventions Intervention Not Indicated  Physical Activity Interventions Intervention Not Indicated  [non weight bear with sore on foot/ DM]  Stress Interventions Intervention Not Indicated        Care Coordination Interventions Activated:  Yes  Care Coordination Interventions:  Yes, provided   Follow up plan: Follow up call scheduled for 05/16/2022    Encounter Outcome:  Pt. Visit Completed   Tomasa Rand, RN, BSN, CEN [place] Coordinator [phone]

## 2022-05-09 DIAGNOSIS — S0003XA Contusion of scalp, initial encounter: Secondary | ICD-10-CM | POA: Diagnosis not present

## 2022-05-09 DIAGNOSIS — W1809XA Striking against other object with subsequent fall, initial encounter: Secondary | ICD-10-CM | POA: Diagnosis not present

## 2022-05-09 DIAGNOSIS — S0001XA Abrasion of scalp, initial encounter: Secondary | ICD-10-CM | POA: Diagnosis not present

## 2022-05-09 DIAGNOSIS — I6782 Cerebral ischemia: Secondary | ICD-10-CM | POA: Diagnosis not present

## 2022-05-09 DIAGNOSIS — W19XXXA Unspecified fall, initial encounter: Secondary | ICD-10-CM | POA: Diagnosis not present

## 2022-05-09 DIAGNOSIS — G319 Degenerative disease of nervous system, unspecified: Secondary | ICD-10-CM | POA: Diagnosis not present

## 2022-05-09 DIAGNOSIS — Y939 Activity, unspecified: Secondary | ICD-10-CM | POA: Diagnosis not present

## 2022-05-09 DIAGNOSIS — J3489 Other specified disorders of nose and nasal sinuses: Secondary | ICD-10-CM | POA: Diagnosis not present

## 2022-05-10 DIAGNOSIS — E11621 Type 2 diabetes mellitus with foot ulcer: Secondary | ICD-10-CM | POA: Diagnosis not present

## 2022-05-10 DIAGNOSIS — L97519 Non-pressure chronic ulcer of other part of right foot with unspecified severity: Secondary | ICD-10-CM | POA: Diagnosis not present

## 2022-05-10 DIAGNOSIS — I739 Peripheral vascular disease, unspecified: Secondary | ICD-10-CM | POA: Diagnosis not present

## 2022-05-12 DIAGNOSIS — L97412 Non-pressure chronic ulcer of right heel and midfoot with fat layer exposed: Secondary | ICD-10-CM | POA: Diagnosis not present

## 2022-05-12 DIAGNOSIS — L97512 Non-pressure chronic ulcer of other part of right foot with fat layer exposed: Secondary | ICD-10-CM | POA: Diagnosis not present

## 2022-05-12 DIAGNOSIS — E11621 Type 2 diabetes mellitus with foot ulcer: Secondary | ICD-10-CM | POA: Diagnosis not present

## 2022-05-16 ENCOUNTER — Ambulatory Visit: Payer: Self-pay

## 2022-05-16 ENCOUNTER — Telehealth: Payer: Self-pay | Admitting: *Deleted

## 2022-05-16 DIAGNOSIS — E11621 Type 2 diabetes mellitus with foot ulcer: Secondary | ICD-10-CM | POA: Diagnosis not present

## 2022-05-16 DIAGNOSIS — L97512 Non-pressure chronic ulcer of other part of right foot with fat layer exposed: Secondary | ICD-10-CM | POA: Diagnosis not present

## 2022-05-16 DIAGNOSIS — L97412 Non-pressure chronic ulcer of right heel and midfoot with fat layer exposed: Secondary | ICD-10-CM | POA: Diagnosis not present

## 2022-05-16 NOTE — Patient Outreach (Signed)
  Care Coordination   Follow Up Visit Note   05/16/2022 Name: Alexander Choi MRN: [id number] DOB: 1933-04-21  Alexander Choi is a 88 y.o. year old male who sees Alexander Dress, MD for primary care. I spoke with  Alexander Choi by phone today.  What matters to the patients health and wellness today?  Patient reports that he is much better. Reports that he got a probiotic and now he is feeling better.   Eat and drinking well. Reports CBG running  around 100.   Goals Addressed               This Visit's Progress     I have no energy and feel weak (pt-stated)        Care Coordination Interventions: Patient is taking probiotic and feeling much better.  Reports he has finished his antibiotic Reviewed with patient the importance of eating and drinking well Reports that he fell today getting off the lawn mower to take my call at 3pm.  Reports fire department had to come get him up. Denies injury. Reports that he is still walking in a boot.           SDOH assessments and interventions completed:  No     Care Coordination Interventions Activated:  Yes  Care Coordination Interventions:  Yes, provided   Follow up plan: Follow up call scheduled for 06/29/2022    Encounter Outcome:  Pt. Visit Completed   Alexander Rand, RN, BSN, CEN [place] Coordinator [phone]

## 2022-05-25 DIAGNOSIS — M25562 Pain in left knee: Secondary | ICD-10-CM | POA: Diagnosis not present

## 2022-05-25 DIAGNOSIS — M1712 Unilateral primary osteoarthritis, left knee: Secondary | ICD-10-CM | POA: Diagnosis not present

## 2022-05-26 DIAGNOSIS — E11621 Type 2 diabetes mellitus with foot ulcer: Secondary | ICD-10-CM | POA: Diagnosis not present

## 2022-05-26 DIAGNOSIS — L97512 Non-pressure chronic ulcer of other part of right foot with fat layer exposed: Secondary | ICD-10-CM | POA: Diagnosis not present

## 2022-05-26 DIAGNOSIS — L97412 Non-pressure chronic ulcer of right heel and midfoot with fat layer exposed: Secondary | ICD-10-CM | POA: Diagnosis not present

## 2022-06-01 DIAGNOSIS — M1712 Unilateral primary osteoarthritis, left knee: Secondary | ICD-10-CM | POA: Diagnosis not present

## 2022-06-02 DIAGNOSIS — R2689 Other abnormalities of gait and mobility: Secondary | ICD-10-CM | POA: Diagnosis not present

## 2022-06-02 DIAGNOSIS — R2681 Unsteadiness on feet: Secondary | ICD-10-CM | POA: Diagnosis not present

## 2022-06-02 DIAGNOSIS — E11621 Type 2 diabetes mellitus with foot ulcer: Secondary | ICD-10-CM | POA: Diagnosis not present

## 2022-06-02 DIAGNOSIS — L97512 Non-pressure chronic ulcer of other part of right foot with fat layer exposed: Secondary | ICD-10-CM | POA: Diagnosis not present

## 2022-06-02 DIAGNOSIS — M6281 Muscle weakness (generalized): Secondary | ICD-10-CM | POA: Diagnosis not present

## 2022-06-02 DIAGNOSIS — L97412 Non-pressure chronic ulcer of right heel and midfoot with fat layer exposed: Secondary | ICD-10-CM | POA: Diagnosis not present

## 2022-06-06 DIAGNOSIS — R2681 Unsteadiness on feet: Secondary | ICD-10-CM | POA: Diagnosis not present

## 2022-06-06 DIAGNOSIS — R2689 Other abnormalities of gait and mobility: Secondary | ICD-10-CM | POA: Diagnosis not present

## 2022-06-06 DIAGNOSIS — M6281 Muscle weakness (generalized): Secondary | ICD-10-CM | POA: Diagnosis not present

## 2022-06-08 DIAGNOSIS — M25562 Pain in left knee: Secondary | ICD-10-CM | POA: Diagnosis not present

## 2022-06-08 DIAGNOSIS — M1712 Unilateral primary osteoarthritis, left knee: Secondary | ICD-10-CM | POA: Diagnosis not present

## 2022-06-09 DIAGNOSIS — J208 Acute bronchitis due to other specified organisms: Secondary | ICD-10-CM | POA: Diagnosis not present

## 2022-06-14 DIAGNOSIS — M6281 Muscle weakness (generalized): Secondary | ICD-10-CM | POA: Diagnosis not present

## 2022-06-14 DIAGNOSIS — R2681 Unsteadiness on feet: Secondary | ICD-10-CM | POA: Diagnosis not present

## 2022-06-14 DIAGNOSIS — R2689 Other abnormalities of gait and mobility: Secondary | ICD-10-CM | POA: Diagnosis not present

## 2022-06-15 DIAGNOSIS — M25562 Pain in left knee: Secondary | ICD-10-CM | POA: Diagnosis not present

## 2022-06-15 DIAGNOSIS — M1712 Unilateral primary osteoarthritis, left knee: Secondary | ICD-10-CM | POA: Diagnosis not present

## 2022-06-16 DIAGNOSIS — L97412 Non-pressure chronic ulcer of right heel and midfoot with fat layer exposed: Secondary | ICD-10-CM | POA: Diagnosis not present

## 2022-06-16 DIAGNOSIS — E11621 Type 2 diabetes mellitus with foot ulcer: Secondary | ICD-10-CM | POA: Diagnosis not present

## 2022-06-16 DIAGNOSIS — M6281 Muscle weakness (generalized): Secondary | ICD-10-CM | POA: Diagnosis not present

## 2022-06-16 DIAGNOSIS — L97519 Non-pressure chronic ulcer of other part of right foot with unspecified severity: Secondary | ICD-10-CM | POA: Diagnosis not present

## 2022-06-16 DIAGNOSIS — R2681 Unsteadiness on feet: Secondary | ICD-10-CM | POA: Diagnosis not present

## 2022-06-16 DIAGNOSIS — L97512 Non-pressure chronic ulcer of other part of right foot with fat layer exposed: Secondary | ICD-10-CM | POA: Diagnosis not present

## 2022-06-16 DIAGNOSIS — M79671 Pain in right foot: Secondary | ICD-10-CM | POA: Diagnosis not present

## 2022-06-16 DIAGNOSIS — R2689 Other abnormalities of gait and mobility: Secondary | ICD-10-CM | POA: Diagnosis not present

## 2022-06-16 DIAGNOSIS — M7989 Other specified soft tissue disorders: Secondary | ICD-10-CM | POA: Diagnosis not present

## 2022-06-21 DIAGNOSIS — R2689 Other abnormalities of gait and mobility: Secondary | ICD-10-CM | POA: Diagnosis not present

## 2022-06-21 DIAGNOSIS — M6281 Muscle weakness (generalized): Secondary | ICD-10-CM | POA: Diagnosis not present

## 2022-06-21 DIAGNOSIS — R2681 Unsteadiness on feet: Secondary | ICD-10-CM | POA: Diagnosis not present

## 2022-06-23 DIAGNOSIS — E11621 Type 2 diabetes mellitus with foot ulcer: Secondary | ICD-10-CM | POA: Diagnosis not present

## 2022-06-23 DIAGNOSIS — M6281 Muscle weakness (generalized): Secondary | ICD-10-CM | POA: Diagnosis not present

## 2022-06-23 DIAGNOSIS — R2681 Unsteadiness on feet: Secondary | ICD-10-CM | POA: Diagnosis not present

## 2022-06-23 DIAGNOSIS — L97412 Non-pressure chronic ulcer of right heel and midfoot with fat layer exposed: Secondary | ICD-10-CM | POA: Diagnosis not present

## 2022-06-23 DIAGNOSIS — L97512 Non-pressure chronic ulcer of other part of right foot with fat layer exposed: Secondary | ICD-10-CM | POA: Diagnosis not present

## 2022-06-23 DIAGNOSIS — R2689 Other abnormalities of gait and mobility: Secondary | ICD-10-CM | POA: Diagnosis not present

## 2022-06-28 DIAGNOSIS — R2681 Unsteadiness on feet: Secondary | ICD-10-CM | POA: Diagnosis not present

## 2022-06-28 DIAGNOSIS — R2689 Other abnormalities of gait and mobility: Secondary | ICD-10-CM | POA: Diagnosis not present

## 2022-06-28 DIAGNOSIS — M6281 Muscle weakness (generalized): Secondary | ICD-10-CM | POA: Diagnosis not present

## 2022-06-29 ENCOUNTER — Ambulatory Visit: Payer: Self-pay

## 2022-06-29 NOTE — Patient Outreach (Signed)
  Care Coordination   Follow Up Visit Note   06/29/2022 Name: Alexander Choi MRN: [id number] DOB: 1932-10-31  Alexander Choi is a 89 y.o. year old male who sees Alexander Dress, MD for primary care. I spoke with  Alexander Choi by phone today.  What matters to the patients health and wellness today?  Spoke with patient today. Patient reports that he is doing well. Reports that his foot is healing.  Reports that he continues to have it wrapped.  Reports that he is active with PT.  Denies any pain today.  Reports CBG of 105 today.     Goals Addressed               This Visit's Progress     I have no energy and feel weak (pt-stated)        Care Coordination Interventions: Reports that he continues to have poor energy.  States that he got a COVID booster 2 days ago. Reviewed current condition with foot Reviewed CBG Reviewed importance of eating and drinking well.  Patient request follow up in 1 month         SDOH assessments and interventions completed:  No     Care Coordination Interventions:  Yes, provided   Follow up plan: Follow up call scheduled for 08/01/2022    Encounter Outcome:  Pt. Visit Completed  Tomasa Rand, RN, BSN, CEN [place] Coordinator [phone]

## 2022-06-30 DIAGNOSIS — L97412 Non-pressure chronic ulcer of right heel and midfoot with fat layer exposed: Secondary | ICD-10-CM | POA: Diagnosis not present

## 2022-06-30 DIAGNOSIS — M6281 Muscle weakness (generalized): Secondary | ICD-10-CM | POA: Diagnosis not present

## 2022-06-30 DIAGNOSIS — E11621 Type 2 diabetes mellitus with foot ulcer: Secondary | ICD-10-CM | POA: Diagnosis not present

## 2022-06-30 DIAGNOSIS — Z89421 Acquired absence of other right toe(s): Secondary | ICD-10-CM | POA: Diagnosis not present

## 2022-06-30 DIAGNOSIS — L97512 Non-pressure chronic ulcer of other part of right foot with fat layer exposed: Secondary | ICD-10-CM | POA: Diagnosis not present

## 2022-06-30 DIAGNOSIS — E11610 Type 2 diabetes mellitus with diabetic neuropathic arthropathy: Secondary | ICD-10-CM | POA: Diagnosis not present

## 2022-06-30 DIAGNOSIS — R2681 Unsteadiness on feet: Secondary | ICD-10-CM | POA: Diagnosis not present

## 2022-06-30 DIAGNOSIS — R2689 Other abnormalities of gait and mobility: Secondary | ICD-10-CM | POA: Diagnosis not present

## 2022-07-05 DIAGNOSIS — R2689 Other abnormalities of gait and mobility: Secondary | ICD-10-CM | POA: Diagnosis not present

## 2022-07-05 DIAGNOSIS — R2681 Unsteadiness on feet: Secondary | ICD-10-CM | POA: Diagnosis not present

## 2022-07-05 DIAGNOSIS — M6281 Muscle weakness (generalized): Secondary | ICD-10-CM | POA: Diagnosis not present

## 2022-07-07 DIAGNOSIS — E11621 Type 2 diabetes mellitus with foot ulcer: Secondary | ICD-10-CM | POA: Diagnosis not present

## 2022-07-07 DIAGNOSIS — M6281 Muscle weakness (generalized): Secondary | ICD-10-CM | POA: Diagnosis not present

## 2022-07-07 DIAGNOSIS — R2681 Unsteadiness on feet: Secondary | ICD-10-CM | POA: Diagnosis not present

## 2022-07-07 DIAGNOSIS — L97519 Non-pressure chronic ulcer of other part of right foot with unspecified severity: Secondary | ICD-10-CM | POA: Diagnosis not present

## 2022-07-07 DIAGNOSIS — L97412 Non-pressure chronic ulcer of right heel and midfoot with fat layer exposed: Secondary | ICD-10-CM | POA: Diagnosis not present

## 2022-07-07 DIAGNOSIS — R2689 Other abnormalities of gait and mobility: Secondary | ICD-10-CM | POA: Diagnosis not present

## 2022-07-08 DIAGNOSIS — R000 Tachycardia, unspecified: Secondary | ICD-10-CM | POA: Diagnosis not present

## 2022-07-08 DIAGNOSIS — Z043 Encounter for examination and observation following other accident: Secondary | ICD-10-CM | POA: Diagnosis not present

## 2022-07-08 DIAGNOSIS — E119 Type 2 diabetes mellitus without complications: Secondary | ICD-10-CM | POA: Diagnosis not present

## 2022-07-08 DIAGNOSIS — I10 Essential (primary) hypertension: Secondary | ICD-10-CM | POA: Diagnosis not present

## 2022-07-08 DIAGNOSIS — Z7901 Long term (current) use of anticoagulants: Secondary | ICD-10-CM | POA: Diagnosis not present

## 2022-07-08 DIAGNOSIS — R519 Headache, unspecified: Secondary | ICD-10-CM | POA: Diagnosis not present

## 2022-07-08 DIAGNOSIS — R58 Hemorrhage, not elsewhere classified: Secondary | ICD-10-CM | POA: Diagnosis not present

## 2022-07-08 DIAGNOSIS — W19XXXA Unspecified fall, initial encounter: Secondary | ICD-10-CM | POA: Diagnosis not present

## 2022-07-12 DIAGNOSIS — M6281 Muscle weakness (generalized): Secondary | ICD-10-CM | POA: Diagnosis not present

## 2022-07-12 DIAGNOSIS — R2681 Unsteadiness on feet: Secondary | ICD-10-CM | POA: Diagnosis not present

## 2022-07-12 DIAGNOSIS — R2689 Other abnormalities of gait and mobility: Secondary | ICD-10-CM | POA: Diagnosis not present

## 2022-07-14 DIAGNOSIS — E11621 Type 2 diabetes mellitus with foot ulcer: Secondary | ICD-10-CM | POA: Diagnosis not present

## 2022-07-14 DIAGNOSIS — M6281 Muscle weakness (generalized): Secondary | ICD-10-CM | POA: Diagnosis not present

## 2022-07-14 DIAGNOSIS — L97512 Non-pressure chronic ulcer of other part of right foot with fat layer exposed: Secondary | ICD-10-CM | POA: Diagnosis not present

## 2022-07-14 DIAGNOSIS — Z7984 Long term (current) use of oral hypoglycemic drugs: Secondary | ICD-10-CM | POA: Diagnosis not present

## 2022-07-14 DIAGNOSIS — R2681 Unsteadiness on feet: Secondary | ICD-10-CM | POA: Diagnosis not present

## 2022-07-14 DIAGNOSIS — L97412 Non-pressure chronic ulcer of right heel and midfoot with fat layer exposed: Secondary | ICD-10-CM | POA: Diagnosis not present

## 2022-07-14 DIAGNOSIS — R2689 Other abnormalities of gait and mobility: Secondary | ICD-10-CM | POA: Diagnosis not present

## 2022-07-19 DIAGNOSIS — M6281 Muscle weakness (generalized): Secondary | ICD-10-CM | POA: Diagnosis not present

## 2022-07-19 DIAGNOSIS — R2689 Other abnormalities of gait and mobility: Secondary | ICD-10-CM | POA: Diagnosis not present

## 2022-07-19 DIAGNOSIS — R2681 Unsteadiness on feet: Secondary | ICD-10-CM | POA: Diagnosis not present

## 2022-07-21 DIAGNOSIS — L97412 Non-pressure chronic ulcer of right heel and midfoot with fat layer exposed: Secondary | ICD-10-CM | POA: Diagnosis not present

## 2022-07-21 DIAGNOSIS — M1712 Unilateral primary osteoarthritis, left knee: Secondary | ICD-10-CM | POA: Diagnosis not present

## 2022-07-21 DIAGNOSIS — E11610 Type 2 diabetes mellitus with diabetic neuropathic arthropathy: Secondary | ICD-10-CM | POA: Diagnosis not present

## 2022-07-21 DIAGNOSIS — R2689 Other abnormalities of gait and mobility: Secondary | ICD-10-CM | POA: Diagnosis not present

## 2022-07-21 DIAGNOSIS — R2681 Unsteadiness on feet: Secondary | ICD-10-CM | POA: Diagnosis not present

## 2022-07-21 DIAGNOSIS — E11621 Type 2 diabetes mellitus with foot ulcer: Secondary | ICD-10-CM | POA: Diagnosis not present

## 2022-07-21 DIAGNOSIS — L97512 Non-pressure chronic ulcer of other part of right foot with fat layer exposed: Secondary | ICD-10-CM | POA: Diagnosis not present

## 2022-07-21 DIAGNOSIS — M6281 Muscle weakness (generalized): Secondary | ICD-10-CM | POA: Diagnosis not present

## 2022-07-26 DIAGNOSIS — M6281 Muscle weakness (generalized): Secondary | ICD-10-CM | POA: Diagnosis not present

## 2022-07-26 DIAGNOSIS — R2681 Unsteadiness on feet: Secondary | ICD-10-CM | POA: Diagnosis not present

## 2022-07-26 DIAGNOSIS — R2689 Other abnormalities of gait and mobility: Secondary | ICD-10-CM | POA: Diagnosis not present

## 2022-07-28 DIAGNOSIS — L97412 Non-pressure chronic ulcer of right heel and midfoot with fat layer exposed: Secondary | ICD-10-CM | POA: Diagnosis not present

## 2022-07-28 DIAGNOSIS — L97512 Non-pressure chronic ulcer of other part of right foot with fat layer exposed: Secondary | ICD-10-CM | POA: Diagnosis not present

## 2022-07-28 DIAGNOSIS — E11621 Type 2 diabetes mellitus with foot ulcer: Secondary | ICD-10-CM | POA: Diagnosis not present

## 2022-08-02 DIAGNOSIS — M6281 Muscle weakness (generalized): Secondary | ICD-10-CM | POA: Diagnosis not present

## 2022-08-02 DIAGNOSIS — R2681 Unsteadiness on feet: Secondary | ICD-10-CM | POA: Diagnosis not present

## 2022-08-02 DIAGNOSIS — R2689 Other abnormalities of gait and mobility: Secondary | ICD-10-CM | POA: Diagnosis not present

## 2022-08-04 DIAGNOSIS — E11621 Type 2 diabetes mellitus with foot ulcer: Secondary | ICD-10-CM | POA: Diagnosis not present

## 2022-08-04 DIAGNOSIS — E1140 Type 2 diabetes mellitus with diabetic neuropathy, unspecified: Secondary | ICD-10-CM | POA: Diagnosis not present

## 2022-08-04 DIAGNOSIS — E039 Hypothyroidism, unspecified: Secondary | ICD-10-CM | POA: Diagnosis not present

## 2022-08-04 DIAGNOSIS — I10 Essential (primary) hypertension: Secondary | ICD-10-CM | POA: Diagnosis not present

## 2022-08-04 DIAGNOSIS — L97412 Non-pressure chronic ulcer of right heel and midfoot with fat layer exposed: Secondary | ICD-10-CM | POA: Diagnosis not present

## 2022-08-04 DIAGNOSIS — D638 Anemia in other chronic diseases classified elsewhere: Secondary | ICD-10-CM | POA: Diagnosis not present

## 2022-08-04 DIAGNOSIS — L97512 Non-pressure chronic ulcer of other part of right foot with fat layer exposed: Secondary | ICD-10-CM | POA: Diagnosis not present

## 2022-08-04 DIAGNOSIS — R2681 Unsteadiness on feet: Secondary | ICD-10-CM | POA: Diagnosis not present

## 2022-08-04 DIAGNOSIS — M1712 Unilateral primary osteoarthritis, left knee: Secondary | ICD-10-CM | POA: Diagnosis not present

## 2022-08-04 DIAGNOSIS — E1165 Type 2 diabetes mellitus with hyperglycemia: Secondary | ICD-10-CM | POA: Diagnosis not present

## 2022-08-04 DIAGNOSIS — R2689 Other abnormalities of gait and mobility: Secondary | ICD-10-CM | POA: Diagnosis not present

## 2022-08-04 DIAGNOSIS — M6281 Muscle weakness (generalized): Secondary | ICD-10-CM | POA: Diagnosis not present

## 2022-08-04 DIAGNOSIS — E785 Hyperlipidemia, unspecified: Secondary | ICD-10-CM | POA: Diagnosis not present

## 2022-08-04 DIAGNOSIS — I25810 Atherosclerosis of coronary artery bypass graft(s) without angina pectoris: Secondary | ICD-10-CM | POA: Diagnosis not present

## 2022-08-04 NOTE — Progress Notes (Unsigned)
Cardiology Office Note:    Date:  08/08/2022   ID:  Alexander Choi, DOB [birth year], MRN [id number]  PCP:  Nicoletta Dress, MD  Cardiologist:  Deziya Amero [name], MD   Referring MD: Nicoletta Dress, MD   Chief Complaint  Patient presents with   Coronary Artery Disease     History of Present Illness:    Alexander Choi is a 89 y.o. male with a hx of CAD status post CABG in 2006, [place] in October 2015 that showed inferior ischemia and led to a heart catheterization that showed all grafts patent with severe disease in the native left circumflex that predated his CABG.  He also has hypertension, hyperlipidemia, DM.    He was admitted in October 2020 at [facility] for bowel obstruction treated with surgical lysis of adhesions.  1 week after discharge he fell in his garage and required shoulder surgery in December 2020.  He is aggressively treated for indigestion with Tagamet and Protonix without improvement.  He underwent EGD that showed mild inflammation.  He has symptoms after eating a meal but also occur with activity.  Due to some chest discomfort with radiation to his left arm, he underwent right and left heart catheterization.  Right heart pressures were normal and his angiogram was stable.  There was an attempt to open the chronically occluded left circumflex but this was unsuccessful due to inability to cross the lesion with a wire.  In retrospect the vessel was really unchanged dating back before his CABG.  He was seen in the ER 01/09/2020 for chest pain but ruled out with negative troponins.  Chest pain resolved after GI cocktail.  In April 2022 he was hospitalized at [facility] for internal bleeding.  He underwent ABIs and arterial duplex found to have PAD and referred to Dr. Gwenlyn Found. He had abnormal dopplers indicating occluded SFAs.  He presented in October 2022 with worsening chest pain. HS troponin trended from 79 --> 878. EKG appeared unchanged from prior tracings.  Repeat heart catheterization was performed and showed continued patency of 3/3 grafts and severely disease, calcified and aneurysmal LCX. Flow appeared worse compared to prior study in the LCX, however, no targets for PCI. Continued medical therapy was recommended. He did not tolerate higher doses of ranexa. Amlodipine increased from 5 mg to 7.5 mg. Intolerant of nitrates due to headache, vomiting, and hand swelling. Resumed 500 mg ranexa BID.   Was seen in October by APP. He was  dealing with a wound on the bottom and side of his right foot.  He does being seen regularly at wound care at [facility]. Wound care is through [facility] and they referred him to a vascular surgeon.  Did see Dr Radene Knee who stated fem to distal bypass was an option if wound did not heal but surgery would be higher risk based on age and co morbidities. Angiogram demonstrated long segment fem-pop disease with anterior tibial artery reconstitution. Not a candidate for percutaneous intervention.   On follow up today he is doing well from a cardiac standpoint. He denies any significant chest pain or dyspnea. He does complain of instability of his left knee which will give way. He is wearing a brace. He has had a number of falls. He does use a cane. He has seen Dr Ronnie Derby and would like to have knee replaced.    Past Medical History:  Diagnosis Date   Cancer [facility])    Chronic kidney disease  kidney stones   Coronary artery disease    with CABG in 2006 with LIMA to LAD, SVG to mid and distal OM, SVG to AM. Normal Myoview in May of 2013   Diabetes mellitus    Type 2   GERD (gastroesophageal reflux disease)    Hematuria    Hypercholesterolemia    Hypertension    Irritable bowel syndrome    Loose stools    Prostate cancer ([place])    Urinary obstruction     Past Surgical History:  Procedure Laterality Date   CHOLECYSTECTOMY     CORONARY ARTERY BYPASS GRAFT  2006   LEFT HEART CATH AND CORS/GRAFTS ANGIOGRAPHY  N/A 04/09/2021   Procedure: LEFT HEART CATH AND CORS/GRAFTS ANGIOGRAPHY;  Surgeon: [name], Jenny Lai M, MD;  Location: [facility];  Service: Cardiovascular;  Laterality: N/A;   LEFT HEART CATHETERIZATION WITH CORONARY/GRAFT ANGIOGRAM N/A 04/11/2014   Procedure: LEFT HEART CATHETERIZATION WITH [name];  Surgeon: Rett Stehlik M [name], MD;  Location: [facility] CATH LAB;  Service: Cardiovascular;  Laterality: N/A;   OTHER SURGICAL HISTORY     bowel obstruction   RADIOACTIVE SEED IMPLANT     for prostate cancer   RIGHT/LEFT HEART CATH AND CORONARY/GRAFT ANGIOGRAPHY N/A 12/12/2019   Procedure: RIGHT/LEFT HEART CATH AND CORONARY/GRAFT ANGIOGRAPHY;  Surgeon: [name], Juleon Narang M, MD;  Location: [place];  Service: Cardiovascular;  Laterality: N/A;   SKIN CANCER EXCISION     TEMPORARY PACEMAKER N/A 12/13/2019   Procedure: TEMPORARY PACEMAKER;  Surgeon: [name], Janecia Palau M, MD;  Location: [facility];  Service: Cardiovascular;  Laterality: N/A;    Current Medications: Current Meds  Medication Sig   Accu-Chek Softclix Lancets lancets    acetaminophen (TYLENOL) 500 MG tablet Take 1,000 mg by mouth every 6 (six) hours as needed for moderate pain or mild pain.   albuterol (VENTOLIN HFA) 108 (90 Base) MCG/ACT inhaler Inhale 2 puffs into the lungs every 6 (six) hours as needed for wheezing or shortness of breath.   amLODipine (NORVASC) 5 MG tablet Take 1.5 tablets (7.5 mg total) by mouth at bedtime.   aspirin 81 MG tablet Take 81 mg by mouth daily.   atorvastatin (LIPITOR) 80 MG tablet Take 1 tablet (80 mg total) by mouth daily.   BREZTRI AEROSPHERE 160-9-4.8 MCG/ACT AERO Inhale 2 puffs into the lungs 2 (two) times daily.   clindamycin (CLEOCIN) 300 MG capsule Take 300 mg by mouth 3 (three) times daily.   famotidine (PEPCID) 40 MG tablet Take 40 mg by mouth daily.   ferrous sulfate 324 (65 Fe) MG TBEC Take 1 tablet by mouth daily.   finasteride (PROSCAR) 5 MG tablet Take 5 mg by mouth  daily.   Fluticasone Propionate 0.05 % LOTN Apply topically once a week.   furosemide (LASIX) 40 MG tablet Take 40 mg by mouth every other day.   gabapentin (NEURONTIN) 100 MG capsule Take 100 mg by mouth 2 (two) times daily.   GEMTESA 75 MG TABS Take 1 tablet by mouth daily as needed.   glimepiride (AMARYL) 2 MG tablet Take 2 mg by mouth daily with breakfast. Take 1 tablet in the am and half a tablet in the pm   glucose blood (ACCU-CHEK AVIVA PLUS) test strip    Ketotifen Fumarate (ITCHY EYE DROPS OP) Apply 2 drops to eye daily as needed (itchyness).   levothyroxine (SYNTHROID) 100 MCG tablet Take 1 tablet (100 mcg total) by mouth daily before breakfast.   losartan (COZAAR) 100  MG tablet Take 100 mg by mouth daily.   Magnesium 200 MG CHEW Chew by mouth.   meclizine (ANTIVERT) 25 MG tablet Take 25 mg by mouth 3 (three) times daily as needed for dizziness.   metFORMIN (GLUCOPHAGE) 500 MG tablet Take 1 tablet (500 mg total) by mouth 2 (two) times daily with a meal. Resume on 04/13/21.   mupirocin ointment (BACTROBAN) 2 % Apply topically as needed for rash.   nitroGLYCERIN (NITROSTAT) 0.4 MG SL tablet Place 1 tablet (0.4 mg total) under the tongue every 5 (five) minutes as needed for chest pain.   ondansetron (ZOFRAN) 4 MG tablet Take 1 tablet (4 mg total) by mouth every 8 (eight) hours as needed for nausea or vomiting.   Probiotic Product (PROBIOTIC-10 PO) Take by mouth.   ranolazine (RANEXA) 500 MG 12 hr tablet Take 1 tablet (500 mg total) by mouth 2 (two) times daily.   Turmeric (QC TUMERIC COMPLEX PO) Take by mouth.   vitamin B-12 (CYANOCOBALAMIN) 1000 MCG tablet Take 1 tablet (1,000 mcg total) by mouth daily.     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  Tobacco Use   Smoking status: Former    Packs/day: 3.00    Types: Cigarettes   Smokeless tobacco: Never  Vaping Use   Vaping Use: Never used  Substance and Sexual Activity   Alcohol use: No   Drug use: No   Sexual activity: Not Currently  Other Topics Concern   Not on file  Social History Narrative   Not on file   Social Determinants of Health   Financial Resource Strain: [place]  (05/05/2022)   Overall Financial Resource Strain (CARDIA)    Difficulty of Paying Living Expenses: Not hard at all  Food Insecurity: No Food Insecurity (05/05/2022)   Hunger Vital Sign    Worried About Running Out of Food in the Last Year: Never true    [place] in the Last Year: Never true  Transportation Needs: No Transportation Needs (05/05/2022)   PRAPARE - Hydrologist (Medical): No    Lack of Transportation (Non-Medical): No  Physical Activity: Inactive (05/05/2022)   Exercise Vital Sign    Days of Exercise per Week: 0 days    Minutes of Exercise per Session: 0 min  Stress: No Stress Concern Present (05/05/2022)   [place]    Feeling of Stress : Not at all  Social Connections: Unknown (05/05/2022)   Social Connection and Isolation Panel [NHANES]    Frequency of Communication with Friends and Family: More than three times a week    Frequency of Social Gatherings with Friends and Family: More than three times a week    Attends Religious Services: Not on Advertising copywriter or Organizations: Not on file    Attends Archivist Meetings: Not on file    Marital Status: Not on file     Family History: The patient's family history includes Cancer in his father and mother; Diabetes in his father; Heart attack  in his father; Hypertension in his father and mother.  ROS:   Please  see the history of present illness.     All other systems reviewed and are negative.  EKGs/Labs/Other Studies Reviewed:    The following studies were reviewed today:  Heart monitor 01/15/20: Normal sinus rhythm Occasional PACs. Occasional brief runs of PAT longest lasting 18.5 seconds.   Staged PCI 12/13/19: Dist LAD-1 lesion is 60% stenosed. Prox Cx lesion is 99% stenosed. Prox Cx to Mid Cx lesion is 90% stenosed. 2nd Mrg lesion is 75% stenosed. Unsuccessful PCI due to inability to cross with a wire.   1. Unsuccessful PCI of the LCx due to inability to cross the lesion with a wire.    Plan: medical therapy. ASA only. Will increase amlodipine to 5 mg daily. Consider Ranexa if symptoms persists. Anticipate DC tomorrow am.   Heart cath 12/12/19: Mid LM lesion is 35% stenosed. Ost LAD to Mid LAD lesion is 100% stenosed. Prox Cx lesion is 99% stenosed. Prox Cx to Mid Cx lesion is 90% stenosed. 1st Mrg lesion is 100% stenosed. 2nd Mrg lesion is 75% stenosed. Prox RCA to Dist RCA lesion is 100% stenosed. LIMA graft was visualized by angiography and is normal in caliber. The graft exhibits no disease. Dist LAD-1 lesion is 60% stenosed. Dist LAD-2 lesion is 90% stenosed. SVG graft was visualized by angiography and is large. The graft exhibits minimal luminal irregularities. SVG graft was visualized by angiography and is normal in caliber. The graft exhibits mild . The left ventricular systolic function is normal. LV end diastolic pressure is normal. The left ventricular ejection fraction is 55-65% by visual estimate.   1. Complex 3 vessel obstructive CAD.  2. Patent LIMA to the LAD. There is severe disease in the distal LAD. The vessel is small in caliber and not suitable for PCI 3. Patent SVG to OM1 4. Patent SVG to RV marginal branch. The RCA is occluded with left to right collaterals from the LCx to the distal RCA 5. Normal LV function 6. Normal LV filling pressures 7.  Normal right heart pressures   Plan: The native LCX is severely diseased proximally and in the mid vessel with aneurysmal dilation and heavy calcification. This supplies the second and third OM branches and collaterals to the distal RCA. This is a potential target for intervention. It will require atherectomy. Given progressive symptoms will admit to telemetry. Start IV heparin. Load with Plavix. Planned staged complex PCI of the LCx tomorrow.   Cardiac cath 04/09/21:  LEFT HEART CATH AND CORS/GRAFTS ANGIOGRAPHY   Conclusion      Mid LM lesion is 35% stenosed.   Ost LAD to Mid LAD lesion is 100% stenosed.   Dist LAD-1 lesion is 60% stenosed.   Dist LAD-2 lesion is 90% stenosed.   Prox RCA to Dist RCA lesion is 100% stenosed.   1st Mrg lesion is 100% stenosed.   2nd Mrg lesion is 75% stenosed.   Prox Cx lesion is 99% stenosed.   Prox Cx to Mid Cx lesion is 95% stenosed.   LIMA and is normal in caliber.   and is normal in caliber.   and is large.   The graft exhibits no disease.   The graft exhibits minimal luminal irregularities.   The graft exhibits mild .   The left ventricular systolic function is normal.   LV end diastolic pressure is normal.   The left ventricular ejection fraction is 55-65% by visual estimate.  Severe 3 vessel obstructive CAD Continued patency of bypass grafts including LIMA to LAD, SVG to OM1, SVG to RV marginal vessel Normal LV function Normal LVEDP   Plan: I suspect his culprit is the native LCx. This vessel is severely diseased, calcified and aneurysmal. Flow appears worse compared to prior study. We have attempted PCI of this in the past but unable to cross with a wire. There are no targets for PCI. Will continue medical therapy. Intolerant of higher Ranexa dose. Will increase amlodipine.    Echo 04/10/21:  IMPRESSIONS     1. Left ventricular ejection fraction, by estimation, is 60 to 65%. The  left ventricle has normal function. The left ventricle  has no regional  wall motion abnormalities. Left ventricular diastolic parameters are  consistent with Grade I diastolic  dysfunction (impaired relaxation).   2. Right ventricular systolic function is normal. The right ventricular  size is normal.   3. The mitral valve is abnormal. Trivial mitral valve regurgitation. No  evidence of mitral stenosis.   4. The aortic valve was not well visualized. Aortic valve regurgitation  is not visualized. Mild to moderate aortic valve sclerosis/calcification  is present, without any evidence of aortic stenosis.   5. The inferior vena cava is normal in size with greater than 50%  respiratory variability, suggesting right atrial pressure of 3 mmHg.   EKG:  EKG is not ordered today.  Recent Labs: No results found for requested labs within last 365 days.  Recent Lipid Panel    Component Value Date/Time   CHOL 95 04/10/2021 0231   TRIG 102 04/10/2021 0231   HDL 27 (L) 04/10/2021 0231   CHOLHDL 3.5 04/10/2021 0231   VLDL 20 04/10/2021 0231   LDLCALC 48 04/10/2021 0231   Dated 04/16/21: A1c 6.8%.  Dated 05/03/22: A1c 7.9%. cholesterol 131, triglycerides 166, HDL 38, LDL 65. CMET normal  Physical Exam:    VS:  BP (!) 134/58   Pulse 78   Ht 5' 10"$  (1.778 m)   Wt 187 lb (84.8 kg)   SpO2 99%   BMI 26.83 kg/m     Wt Readings from Last 3 Encounters:  08/08/22 187 lb (84.8 kg)  04/26/22 193 lb (87.5 kg)  04/19/22 187 lb (84.8 kg)     GEN:  Well nourished, well developed in no acute distress HEENT: Normal NECK: No JVD; No carotid bruits LYMPHATICS: No lymphadenopathy CARDIAC: RRR, soft systolic murmur RESPIRATORY:  Clear to auscultation without rales, wheezing or rhonchi  ABDOMEN: Soft, non-tender, non-distended MUSCULOSKELETAL:  No edema; No deformity  SKIN: Warm and dry NEUROLOGIC:  Alert and oriented x 3 PSYCHIATRIC:  Normal affect   ASSESSMENT:    1. Coronary artery disease involving coronary bypass graft of native heart with  angina pectoris ([place])   2. Peripheral arterial disease ([place])   3. Essential hypertension   4. Hyperlipidemia LDL goal <70   5. Type 2 diabetes mellitus without complication, without long-term current use of insulin (HCC)      PLAN:    CAD status post CABG 2006 (LIMA-LAD, SVG-RV marginal branch, SVG-OM1) -  heart catheterization in October 2022 with patent grafts - Chronically occluded LCX - previously unable to cross with wire.  - continue medical management with ranexa and amlodipine, ASA, statin - not on BB - intolerant of nitrates.  - no significant angina today. Activity limited by orthopedic issues.  2. Hyperlipidemia with LDL goal < 70 - Continue 80 mg lipitor - requested most recent  labs with Dr Delena Bali  3. DM2 - Per PCP - on metformin and glimiperide.   4. Hypertension - amlodipine, losartan - BP control improved.   5. Paroxysmal atrial tachycardia - Noted on telemetry in hospital and heart monitor 2021 - not on a BB - no palpitations   6. PAD- bilateral SFA occlusion. - ulcer on right  heel improving. Prior ulcer on left heel has healed.   7. Pre op evaluation: patient is at high risk for general anesthesia based on age, CAD, PAD, DM. There is nothing we could add at this point that would improve operative risk. Patient states his quality of life is impaired to the point he would like to have TKR even with increased risk. He will need to discuss with Dr Ronnie Derby   Follow up in 6 months.  Medication Adjustments/Labs and Tests Ordered: Current medicines are reviewed at length with the patient today.  Concerns regarding medicines are outlined above.  No orders of the defined types were placed in this encounter.    No orders of the defined types were placed in this encounter.     Signed, Yanna Leaks [name], MD  08/08/2022 10:42 AM    [facility]

## 2022-08-08 ENCOUNTER — Ambulatory Visit: Payer: Medicare PPO | Attending: Cardiology | Admitting: Cardiology

## 2022-08-08 ENCOUNTER — Encounter: Payer: Self-pay | Admitting: Cardiology

## 2022-08-08 VITALS — BP 134/58 | HR 78 | Ht 70.0 in | Wt 187.0 lb

## 2022-08-08 DIAGNOSIS — E785 Hyperlipidemia, unspecified: Secondary | ICD-10-CM

## 2022-08-08 DIAGNOSIS — E119 Type 2 diabetes mellitus without complications: Secondary | ICD-10-CM

## 2022-08-08 DIAGNOSIS — I10 Essential (primary) hypertension: Secondary | ICD-10-CM

## 2022-08-08 DIAGNOSIS — I25709 Atherosclerosis of coronary artery bypass graft(s), unspecified, with unspecified angina pectoris: Secondary | ICD-10-CM | POA: Diagnosis not present

## 2022-08-08 DIAGNOSIS — I739 Peripheral vascular disease, unspecified: Secondary | ICD-10-CM

## 2022-08-08 NOTE — Patient Instructions (Signed)
Medication Instructions:  No changes *If you need a refill on your cardiac medications before your next appointment, please call your pharmacy*  Follow-Up: At [facility], you and your health needs are our priority.  As part of our continuing mission to provide you with exceptional heart care, we have created designated Provider Care Teams.  These Care Teams include your primary Cardiologist (physician) and Advanced Practice Providers (APPs -  Physician Assistants and Nurse Practitioners) who all work together to provide you with the care you need, when you need it.  We recommend signing up for the patient portal called "MyChart".  Sign up information is provided on this After Visit Summary.  MyChart is used to connect with patients for Virtual Visits (Telemedicine).  Patients are able to view lab/test results, encounter notes, upcoming appointments, etc.  Non-urgent messages can be sent to your provider as well.   To learn more about what you can do with MyChart, go to [email or web address].    Your next appointment:   6 month(s)  Provider:   Peter [name], MD

## 2022-08-09 DIAGNOSIS — R2689 Other abnormalities of gait and mobility: Secondary | ICD-10-CM | POA: Diagnosis not present

## 2022-08-09 DIAGNOSIS — R2681 Unsteadiness on feet: Secondary | ICD-10-CM | POA: Diagnosis not present

## 2022-08-09 DIAGNOSIS — M6281 Muscle weakness (generalized): Secondary | ICD-10-CM | POA: Diagnosis not present

## 2022-08-10 ENCOUNTER — Ambulatory Visit: Payer: Self-pay

## 2022-08-10 NOTE — Patient Outreach (Signed)
  Care Coordination   Follow Up Visit Note   08/10/2022 Name: Alexander Choi MRN: [id number] DOB: 1933-01-20  Alexander Choi is a 89 y.o. year old male who sees Nicoletta Dress, MD for primary care. I spoke with  [name] by phone today.  What matters to the patients health and wellness today?  Patient reports that he is doing well. Reports that he wants to get a knee replacement but his A1c went up to 8.4.  Reports that he is working on getting his A1c down again. Reports that after Christmas he has been snacking a lot on cookies. Reports that  he enjoys eating biscuits.  States that his weight has creep up about 5 pounds.  States his CBG today was 118.  Reports the last few weeks that he was running 130's. Patient reports that he continues to go to therapy twice a week. Reports wound is almost completely healed on his foot.    Goals Addressed               This Visit's Progress     I have no energy and feel weak (pt-stated)        Care Coordination Interventions: Reviewed increase in A1c and patients motivation to decrease level back to 7. Reviewed diet with snacking Encouraged increase in exercise Reviewed importance of limiting cookies and bread Reviewed with patient that he has an appointment with dietician next week.  Patient prefers call back in 6 weeks. Appointment scheduled Provided my contact information in patient needs assistance sooner than scheduled follow up call.          SDOH assessments and interventions completed:  No     Care Coordination Interventions:  Yes, provided   Follow up plan: Follow up call scheduled for 09/21/2022    Encounter Outcome:  Pt. Visit Completed   Tomasa Rand, RN, BSN, CEN [place] Coordinator [phone]

## 2022-08-11 DIAGNOSIS — M6281 Muscle weakness (generalized): Secondary | ICD-10-CM | POA: Diagnosis not present

## 2022-08-11 DIAGNOSIS — E11621 Type 2 diabetes mellitus with foot ulcer: Secondary | ICD-10-CM | POA: Diagnosis not present

## 2022-08-11 DIAGNOSIS — L97412 Non-pressure chronic ulcer of right heel and midfoot with fat layer exposed: Secondary | ICD-10-CM | POA: Diagnosis not present

## 2022-08-11 DIAGNOSIS — R2689 Other abnormalities of gait and mobility: Secondary | ICD-10-CM | POA: Diagnosis not present

## 2022-08-11 DIAGNOSIS — L97512 Non-pressure chronic ulcer of other part of right foot with fat layer exposed: Secondary | ICD-10-CM | POA: Diagnosis not present

## 2022-08-11 DIAGNOSIS — R2681 Unsteadiness on feet: Secondary | ICD-10-CM | POA: Diagnosis not present

## 2022-08-15 ENCOUNTER — Telehealth: Payer: Self-pay | Admitting: *Deleted

## 2022-08-15 DIAGNOSIS — E1165 Type 2 diabetes mellitus with hyperglycemia: Secondary | ICD-10-CM | POA: Diagnosis not present

## 2022-08-15 DIAGNOSIS — E1140 Type 2 diabetes mellitus with diabetic neuropathy, unspecified: Secondary | ICD-10-CM | POA: Diagnosis not present

## 2022-08-15 NOTE — Telephone Encounter (Signed)
   Primary Cardiologist: Peter [name], MD  Chart reviewed as part of pre-operative protocol coverage. Alexander Choi was seen by primary cardiologist, Alexander. [name], on 08/08/22. Per Alexander. [name]:  7. Pre op evaluation: patient is at high risk for general anesthesia based on age, CAD, PAD, DM. There is nothing we could add at this point that would improve operative risk. Patient states his quality of life is impaired to the point he would like to have TKR even with increased risk. He will need to discuss with Alexander Choi   I will route this recommendation to the requesting party via [place] fax function and remove from pre-op pool.  Please call with questions.  Emmaline Life, NP-C 08/15/2022, 11:18 AM [address] Office [phone] Fax [phone]

## 2022-08-15 NOTE — Telephone Encounter (Signed)
   Pre-operative Risk Assessment    Patient Name: Alexander Choi  DOB: 04/07/1933 MRN: [id number]      Request for Surgical Clearance    Procedure:   Left total knee replacement  Date of Surgery:  Clearance TBD                                 Surgeon:  Dr. Lara Mulch  Surgeon's Group or Practice Name:  [facility] Phone number:  [phone] Fax number:  [phone]   Type of Clearance Requested:   - Medical    Type of Anesthesia:  Spinal   Additional requests/questions:  Please advise surgeon/provider what medications should be held.  SignedSilverio Lay   08/15/2022, 10:18 AM

## 2022-08-16 DIAGNOSIS — M6281 Muscle weakness (generalized): Secondary | ICD-10-CM | POA: Diagnosis not present

## 2022-08-16 DIAGNOSIS — R2681 Unsteadiness on feet: Secondary | ICD-10-CM | POA: Diagnosis not present

## 2022-08-16 DIAGNOSIS — R2689 Other abnormalities of gait and mobility: Secondary | ICD-10-CM | POA: Diagnosis not present

## 2022-08-18 DIAGNOSIS — E11621 Type 2 diabetes mellitus with foot ulcer: Secondary | ICD-10-CM | POA: Diagnosis not present

## 2022-08-18 DIAGNOSIS — R2681 Unsteadiness on feet: Secondary | ICD-10-CM | POA: Diagnosis not present

## 2022-08-18 DIAGNOSIS — L97512 Non-pressure chronic ulcer of other part of right foot with fat layer exposed: Secondary | ICD-10-CM | POA: Diagnosis not present

## 2022-08-18 DIAGNOSIS — R2689 Other abnormalities of gait and mobility: Secondary | ICD-10-CM | POA: Diagnosis not present

## 2022-08-18 DIAGNOSIS — M6281 Muscle weakness (generalized): Secondary | ICD-10-CM | POA: Diagnosis not present

## 2022-08-23 DIAGNOSIS — R2689 Other abnormalities of gait and mobility: Secondary | ICD-10-CM | POA: Diagnosis not present

## 2022-08-23 DIAGNOSIS — M6281 Muscle weakness (generalized): Secondary | ICD-10-CM | POA: Diagnosis not present

## 2022-08-23 DIAGNOSIS — R2681 Unsteadiness on feet: Secondary | ICD-10-CM | POA: Diagnosis not present

## 2022-08-25 DIAGNOSIS — E11610 Type 2 diabetes mellitus with diabetic neuropathic arthropathy: Secondary | ICD-10-CM | POA: Diagnosis not present

## 2022-08-25 DIAGNOSIS — E11621 Type 2 diabetes mellitus with foot ulcer: Secondary | ICD-10-CM | POA: Diagnosis not present

## 2022-08-25 DIAGNOSIS — L97412 Non-pressure chronic ulcer of right heel and midfoot with fat layer exposed: Secondary | ICD-10-CM | POA: Diagnosis not present

## 2022-09-05 DIAGNOSIS — L578 Other skin changes due to chronic exposure to nonionizing radiation: Secondary | ICD-10-CM | POA: Diagnosis not present

## 2022-09-05 DIAGNOSIS — L821 Other seborrheic keratosis: Secondary | ICD-10-CM | POA: Diagnosis not present

## 2022-09-05 DIAGNOSIS — R233 Spontaneous ecchymoses: Secondary | ICD-10-CM | POA: Diagnosis not present

## 2022-09-05 DIAGNOSIS — L570 Actinic keratosis: Secondary | ICD-10-CM | POA: Diagnosis not present

## 2022-09-08 DIAGNOSIS — E11610 Type 2 diabetes mellitus with diabetic neuropathic arthropathy: Secondary | ICD-10-CM | POA: Diagnosis not present

## 2022-09-08 DIAGNOSIS — M86171 Other acute osteomyelitis, right ankle and foot: Secondary | ICD-10-CM | POA: Diagnosis not present

## 2022-09-08 DIAGNOSIS — L97514 Non-pressure chronic ulcer of other part of right foot with necrosis of bone: Secondary | ICD-10-CM | POA: Diagnosis not present

## 2022-09-08 DIAGNOSIS — Z89421 Acquired absence of other right toe(s): Secondary | ICD-10-CM | POA: Diagnosis not present

## 2022-09-08 DIAGNOSIS — I10 Essential (primary) hypertension: Secondary | ICD-10-CM | POA: Diagnosis not present

## 2022-09-08 DIAGNOSIS — L97412 Non-pressure chronic ulcer of right heel and midfoot with fat layer exposed: Secondary | ICD-10-CM | POA: Diagnosis not present

## 2022-09-08 DIAGNOSIS — E1169 Type 2 diabetes mellitus with other specified complication: Secondary | ICD-10-CM | POA: Diagnosis not present

## 2022-09-08 DIAGNOSIS — E11621 Type 2 diabetes mellitus with foot ulcer: Secondary | ICD-10-CM | POA: Diagnosis not present

## 2022-09-08 DIAGNOSIS — M7989 Other specified soft tissue disorders: Secondary | ICD-10-CM | POA: Diagnosis not present

## 2022-09-13 DIAGNOSIS — S91301D Unspecified open wound, right foot, subsequent encounter: Secondary | ICD-10-CM | POA: Diagnosis not present

## 2022-09-13 DIAGNOSIS — R531 Weakness: Secondary | ICD-10-CM | POA: Diagnosis not present

## 2022-09-13 DIAGNOSIS — R5383 Other fatigue: Secondary | ICD-10-CM | POA: Diagnosis not present

## 2022-09-13 DIAGNOSIS — N433 Hydrocele, unspecified: Secondary | ICD-10-CM | POA: Diagnosis not present

## 2022-09-13 DIAGNOSIS — C61 Malignant neoplasm of prostate: Secondary | ICD-10-CM | POA: Diagnosis not present

## 2022-09-13 DIAGNOSIS — N50811 Right testicular pain: Secondary | ICD-10-CM | POA: Diagnosis not present

## 2022-09-13 DIAGNOSIS — N200 Calculus of kidney: Secondary | ICD-10-CM | POA: Diagnosis not present

## 2022-09-13 DIAGNOSIS — R609 Edema, unspecified: Secondary | ICD-10-CM | POA: Diagnosis not present

## 2022-09-13 DIAGNOSIS — K4020 Bilateral inguinal hernia, without obstruction or gangrene, not specified as recurrent: Secondary | ICD-10-CM | POA: Diagnosis not present

## 2022-09-13 DIAGNOSIS — I214 Non-ST elevation (NSTEMI) myocardial infarction: Secondary | ICD-10-CM | POA: Diagnosis not present

## 2022-09-13 DIAGNOSIS — I509 Heart failure, unspecified: Secondary | ICD-10-CM | POA: Diagnosis not present

## 2022-09-13 DIAGNOSIS — I959 Hypotension, unspecified: Secondary | ICD-10-CM | POA: Diagnosis not present

## 2022-09-14 DIAGNOSIS — M7989 Other specified soft tissue disorders: Secondary | ICD-10-CM | POA: Diagnosis not present

## 2022-09-14 DIAGNOSIS — I5033 Acute on chronic diastolic (congestive) heart failure: Secondary | ICD-10-CM | POA: Diagnosis not present

## 2022-09-14 DIAGNOSIS — E782 Mixed hyperlipidemia: Secondary | ICD-10-CM | POA: Diagnosis not present

## 2022-09-14 DIAGNOSIS — K4020 Bilateral inguinal hernia, without obstruction or gangrene, not specified as recurrent: Secondary | ICD-10-CM | POA: Diagnosis not present

## 2022-09-14 DIAGNOSIS — I110 Hypertensive heart disease with heart failure: Secondary | ICD-10-CM | POA: Diagnosis not present

## 2022-09-14 DIAGNOSIS — I10 Essential (primary) hypertension: Secondary | ICD-10-CM | POA: Diagnosis not present

## 2022-09-14 DIAGNOSIS — I509 Heart failure, unspecified: Secondary | ICD-10-CM | POA: Diagnosis not present

## 2022-09-14 DIAGNOSIS — M869 Osteomyelitis, unspecified: Secondary | ICD-10-CM | POA: Diagnosis not present

## 2022-09-14 DIAGNOSIS — I214 Non-ST elevation (NSTEMI) myocardial infarction: Secondary | ICD-10-CM | POA: Diagnosis not present

## 2022-09-14 DIAGNOSIS — I350 Nonrheumatic aortic (valve) stenosis: Secondary | ICD-10-CM | POA: Diagnosis not present

## 2022-09-14 DIAGNOSIS — S91301D Unspecified open wound, right foot, subsequent encounter: Secondary | ICD-10-CM | POA: Diagnosis not present

## 2022-09-14 DIAGNOSIS — I21A1 Myocardial infarction type 2: Secondary | ICD-10-CM | POA: Diagnosis not present

## 2022-09-14 DIAGNOSIS — I25810 Atherosclerosis of coronary artery bypass graft(s) without angina pectoris: Secondary | ICD-10-CM | POA: Diagnosis not present

## 2022-09-14 DIAGNOSIS — E11621 Type 2 diabetes mellitus with foot ulcer: Secondary | ICD-10-CM | POA: Diagnosis not present

## 2022-09-14 DIAGNOSIS — R778 Other specified abnormalities of plasma proteins: Secondary | ICD-10-CM | POA: Diagnosis not present

## 2022-09-15 DIAGNOSIS — I350 Nonrheumatic aortic (valve) stenosis: Secondary | ICD-10-CM | POA: Diagnosis not present

## 2022-09-15 DIAGNOSIS — R778 Other specified abnormalities of plasma proteins: Secondary | ICD-10-CM | POA: Diagnosis not present

## 2022-09-15 DIAGNOSIS — E782 Mixed hyperlipidemia: Secondary | ICD-10-CM | POA: Diagnosis not present

## 2022-09-15 DIAGNOSIS — I110 Hypertensive heart disease with heart failure: Secondary | ICD-10-CM | POA: Diagnosis not present

## 2022-09-15 DIAGNOSIS — I5033 Acute on chronic diastolic (congestive) heart failure: Secondary | ICD-10-CM | POA: Diagnosis not present

## 2022-09-15 DIAGNOSIS — I25810 Atherosclerosis of coronary artery bypass graft(s) without angina pectoris: Secondary | ICD-10-CM | POA: Diagnosis not present

## 2022-09-15 DIAGNOSIS — I21A1 Myocardial infarction type 2: Secondary | ICD-10-CM | POA: Diagnosis not present

## 2022-09-15 DIAGNOSIS — I10 Essential (primary) hypertension: Secondary | ICD-10-CM | POA: Diagnosis not present

## 2022-09-16 DIAGNOSIS — I21A1 Myocardial infarction type 2: Secondary | ICD-10-CM | POA: Diagnosis not present

## 2022-09-16 DIAGNOSIS — I25810 Atherosclerosis of coronary artery bypass graft(s) without angina pectoris: Secondary | ICD-10-CM | POA: Diagnosis not present

## 2022-09-16 DIAGNOSIS — E782 Mixed hyperlipidemia: Secondary | ICD-10-CM | POA: Diagnosis not present

## 2022-09-16 DIAGNOSIS — I5033 Acute on chronic diastolic (congestive) heart failure: Secondary | ICD-10-CM | POA: Diagnosis not present

## 2022-09-16 DIAGNOSIS — I350 Nonrheumatic aortic (valve) stenosis: Secondary | ICD-10-CM | POA: Diagnosis not present

## 2022-09-16 DIAGNOSIS — I110 Hypertensive heart disease with heart failure: Secondary | ICD-10-CM | POA: Diagnosis not present

## 2022-09-16 DIAGNOSIS — I10 Essential (primary) hypertension: Secondary | ICD-10-CM | POA: Diagnosis not present

## 2022-09-16 DIAGNOSIS — R778 Other specified abnormalities of plasma proteins: Secondary | ICD-10-CM | POA: Diagnosis not present

## 2022-09-17 DIAGNOSIS — E119 Type 2 diabetes mellitus without complications: Secondary | ICD-10-CM | POA: Diagnosis not present

## 2022-09-17 DIAGNOSIS — I110 Hypertensive heart disease with heart failure: Secondary | ICD-10-CM | POA: Diagnosis not present

## 2022-09-17 DIAGNOSIS — I5033 Acute on chronic diastolic (congestive) heart failure: Secondary | ICD-10-CM | POA: Diagnosis not present

## 2022-09-17 DIAGNOSIS — I5032 Chronic diastolic (congestive) heart failure: Secondary | ICD-10-CM | POA: Diagnosis not present

## 2022-09-17 DIAGNOSIS — I21A1 Myocardial infarction type 2: Secondary | ICD-10-CM | POA: Diagnosis not present

## 2022-09-17 DIAGNOSIS — I739 Peripheral vascular disease, unspecified: Secondary | ICD-10-CM | POA: Diagnosis not present

## 2022-09-17 DIAGNOSIS — R778 Other specified abnormalities of plasma proteins: Secondary | ICD-10-CM | POA: Diagnosis not present

## 2022-09-17 DIAGNOSIS — I2510 Atherosclerotic heart disease of native coronary artery without angina pectoris: Secondary | ICD-10-CM | POA: Diagnosis not present

## 2022-09-18 DIAGNOSIS — R262 Difficulty in walking, not elsewhere classified: Secondary | ICD-10-CM | POA: Diagnosis not present

## 2022-09-18 DIAGNOSIS — J449 Chronic obstructive pulmonary disease, unspecified: Secondary | ICD-10-CM | POA: Diagnosis not present

## 2022-09-18 DIAGNOSIS — L97514 Non-pressure chronic ulcer of other part of right foot with necrosis of bone: Secondary | ICD-10-CM | POA: Diagnosis not present

## 2022-09-18 DIAGNOSIS — I739 Peripheral vascular disease, unspecified: Secondary | ICD-10-CM | POA: Diagnosis not present

## 2022-09-18 DIAGNOSIS — I10 Essential (primary) hypertension: Secondary | ICD-10-CM | POA: Diagnosis not present

## 2022-09-18 DIAGNOSIS — D649 Anemia, unspecified: Secondary | ICD-10-CM | POA: Diagnosis not present

## 2022-09-18 DIAGNOSIS — R531 Weakness: Secondary | ICD-10-CM | POA: Diagnosis not present

## 2022-09-18 DIAGNOSIS — N390 Urinary tract infection, site not specified: Secondary | ICD-10-CM | POA: Diagnosis not present

## 2022-09-18 DIAGNOSIS — R0789 Other chest pain: Secondary | ICD-10-CM | POA: Diagnosis not present

## 2022-09-18 DIAGNOSIS — L97516 Non-pressure chronic ulcer of other part of right foot with bone involvement without evidence of necrosis: Secondary | ICD-10-CM | POA: Diagnosis not present

## 2022-09-18 DIAGNOSIS — R2689 Other abnormalities of gait and mobility: Secondary | ICD-10-CM | POA: Diagnosis not present

## 2022-09-18 DIAGNOSIS — R059 Cough, unspecified: Secondary | ICD-10-CM | POA: Diagnosis not present

## 2022-09-18 DIAGNOSIS — R799 Abnormal finding of blood chemistry, unspecified: Secondary | ICD-10-CM | POA: Diagnosis not present

## 2022-09-18 DIAGNOSIS — I5032 Chronic diastolic (congestive) heart failure: Secondary | ICD-10-CM | POA: Diagnosis not present

## 2022-09-18 DIAGNOSIS — E785 Hyperlipidemia, unspecified: Secondary | ICD-10-CM | POA: Diagnosis not present

## 2022-09-18 DIAGNOSIS — E11610 Type 2 diabetes mellitus with diabetic neuropathic arthropathy: Secondary | ICD-10-CM | POA: Diagnosis not present

## 2022-09-18 DIAGNOSIS — E119 Type 2 diabetes mellitus without complications: Secondary | ICD-10-CM | POA: Diagnosis not present

## 2022-09-18 DIAGNOSIS — I959 Hypotension, unspecified: Secondary | ICD-10-CM | POA: Diagnosis not present

## 2022-09-18 DIAGNOSIS — L97519 Non-pressure chronic ulcer of other part of right foot with unspecified severity: Secondary | ICD-10-CM | POA: Diagnosis not present

## 2022-09-18 DIAGNOSIS — I2510 Atherosclerotic heart disease of native coronary artery without angina pectoris: Secondary | ICD-10-CM | POA: Diagnosis not present

## 2022-09-18 DIAGNOSIS — R946 Abnormal results of thyroid function studies: Secondary | ICD-10-CM | POA: Diagnosis not present

## 2022-09-18 DIAGNOSIS — L97422 Non-pressure chronic ulcer of left heel and midfoot with fat layer exposed: Secondary | ICD-10-CM | POA: Diagnosis not present

## 2022-09-18 DIAGNOSIS — M86171 Other acute osteomyelitis, right ankle and foot: Secondary | ICD-10-CM | POA: Diagnosis not present

## 2022-09-18 DIAGNOSIS — Z743 Need for continuous supervision: Secondary | ICD-10-CM | POA: Diagnosis not present

## 2022-09-18 DIAGNOSIS — I110 Hypertensive heart disease with heart failure: Secondary | ICD-10-CM | POA: Diagnosis not present

## 2022-09-18 DIAGNOSIS — R2681 Unsteadiness on feet: Secondary | ICD-10-CM | POA: Diagnosis not present

## 2022-09-18 DIAGNOSIS — I4719 Other supraventricular tachycardia: Secondary | ICD-10-CM | POA: Diagnosis not present

## 2022-09-18 DIAGNOSIS — E612 Magnesium deficiency: Secondary | ICD-10-CM | POA: Diagnosis not present

## 2022-09-18 DIAGNOSIS — E1169 Type 2 diabetes mellitus with other specified complication: Secondary | ICD-10-CM | POA: Diagnosis not present

## 2022-09-18 DIAGNOSIS — M869 Osteomyelitis, unspecified: Secondary | ICD-10-CM | POA: Diagnosis not present

## 2022-09-18 DIAGNOSIS — I21A1 Myocardial infarction type 2: Secondary | ICD-10-CM | POA: Diagnosis not present

## 2022-09-18 DIAGNOSIS — L89623 Pressure ulcer of left heel, stage 3: Secondary | ICD-10-CM | POA: Diagnosis not present

## 2022-09-18 DIAGNOSIS — U071 COVID-19: Secondary | ICD-10-CM | POA: Diagnosis not present

## 2022-09-18 DIAGNOSIS — Z13228 Encounter for screening for other metabolic disorders: Secondary | ICD-10-CM | POA: Diagnosis not present

## 2022-09-18 DIAGNOSIS — E1151 Type 2 diabetes mellitus with diabetic peripheral angiopathy without gangrene: Secondary | ICD-10-CM | POA: Diagnosis not present

## 2022-09-18 DIAGNOSIS — L97412 Non-pressure chronic ulcer of right heel and midfoot with fat layer exposed: Secondary | ICD-10-CM | POA: Diagnosis not present

## 2022-09-18 DIAGNOSIS — R778 Other specified abnormalities of plasma proteins: Secondary | ICD-10-CM | POA: Diagnosis not present

## 2022-09-18 DIAGNOSIS — I25118 Atherosclerotic heart disease of native coronary artery with other forms of angina pectoris: Secondary | ICD-10-CM | POA: Diagnosis not present

## 2022-09-18 DIAGNOSIS — I214 Non-ST elevation (NSTEMI) myocardial infarction: Secondary | ICD-10-CM | POA: Diagnosis not present

## 2022-09-18 DIAGNOSIS — I5033 Acute on chronic diastolic (congestive) heart failure: Secondary | ICD-10-CM | POA: Diagnosis not present

## 2022-09-18 DIAGNOSIS — I509 Heart failure, unspecified: Secondary | ICD-10-CM | POA: Diagnosis not present

## 2022-09-18 DIAGNOSIS — R509 Fever, unspecified: Secondary | ICD-10-CM | POA: Diagnosis not present

## 2022-09-18 DIAGNOSIS — R0609 Other forms of dyspnea: Secondary | ICD-10-CM | POA: Diagnosis not present

## 2022-09-18 DIAGNOSIS — R079 Chest pain, unspecified: Secondary | ICD-10-CM | POA: Diagnosis not present

## 2022-09-18 DIAGNOSIS — E11621 Type 2 diabetes mellitus with foot ulcer: Secondary | ICD-10-CM | POA: Diagnosis not present

## 2022-09-18 DIAGNOSIS — I350 Nonrheumatic aortic (valve) stenosis: Secondary | ICD-10-CM | POA: Diagnosis not present

## 2022-09-19 DIAGNOSIS — M869 Osteomyelitis, unspecified: Secondary | ICD-10-CM | POA: Diagnosis not present

## 2022-09-19 DIAGNOSIS — R262 Difficulty in walking, not elsewhere classified: Secondary | ICD-10-CM | POA: Diagnosis not present

## 2022-09-19 DIAGNOSIS — I5033 Acute on chronic diastolic (congestive) heart failure: Secondary | ICD-10-CM | POA: Diagnosis not present

## 2022-09-19 DIAGNOSIS — D649 Anemia, unspecified: Secondary | ICD-10-CM | POA: Diagnosis not present

## 2022-09-21 ENCOUNTER — Ambulatory Visit: Payer: Self-pay

## 2022-09-21 NOTE — Patient Outreach (Signed)
  Care Coordination   Follow Up Visit Note   09/21/2022 Name: Alexander Choi MRN: [id number] DOB: [birth year]  Alexander Choi is a 89 y.o. year old male who sees Nicoletta Dress, MD for primary care. I  received a voicemail from son of patient to tell me that patient is at [facility]. I was provided patients cell phone number. Called patient during scheduled visit and no answer. Left a message.   What matters to the patients health and wellness today?  Follow up call. No answer.    SDOH assessments and interventions completed:  No     Care Coordination Interventions:  No, not indicated   Follow up plan:  will reschedule   Will send in basket message to post acute liaison for follow up at [facility].  Encounter Outcome:  No Answer   Tomasa Rand, RN, BSN, CEN [place] Coordinator [phone]

## 2022-09-26 DIAGNOSIS — L89623 Pressure ulcer of left heel, stage 3: Secondary | ICD-10-CM | POA: Diagnosis not present

## 2022-09-26 DIAGNOSIS — L97519 Non-pressure chronic ulcer of other part of right foot with unspecified severity: Secondary | ICD-10-CM | POA: Diagnosis not present

## 2022-09-26 DIAGNOSIS — E1169 Type 2 diabetes mellitus with other specified complication: Secondary | ICD-10-CM | POA: Diagnosis not present

## 2022-09-26 DIAGNOSIS — E11621 Type 2 diabetes mellitus with foot ulcer: Secondary | ICD-10-CM | POA: Diagnosis not present

## 2022-09-26 DIAGNOSIS — L97516 Non-pressure chronic ulcer of other part of right foot with bone involvement without evidence of necrosis: Secondary | ICD-10-CM | POA: Diagnosis not present

## 2022-09-26 DIAGNOSIS — M86171 Other acute osteomyelitis, right ankle and foot: Secondary | ICD-10-CM | POA: Diagnosis not present

## 2022-09-26 DIAGNOSIS — L97412 Non-pressure chronic ulcer of right heel and midfoot with fat layer exposed: Secondary | ICD-10-CM | POA: Diagnosis not present

## 2022-09-29 ENCOUNTER — Ambulatory Visit: Payer: Medicare PPO | Attending: Nurse Practitioner | Admitting: Nurse Practitioner

## 2022-09-29 ENCOUNTER — Encounter: Payer: Self-pay | Admitting: Nurse Practitioner

## 2022-09-29 VITALS — BP 116/48 | HR 60 | Ht 70.0 in | Wt 168.0 lb

## 2022-09-29 DIAGNOSIS — I25118 Atherosclerotic heart disease of native coronary artery with other forms of angina pectoris: Secondary | ICD-10-CM | POA: Diagnosis not present

## 2022-09-29 DIAGNOSIS — E785 Hyperlipidemia, unspecified: Secondary | ICD-10-CM | POA: Diagnosis not present

## 2022-09-29 DIAGNOSIS — I4719 Other supraventricular tachycardia: Secondary | ICD-10-CM

## 2022-09-29 DIAGNOSIS — I350 Nonrheumatic aortic (valve) stenosis: Secondary | ICD-10-CM

## 2022-09-29 DIAGNOSIS — I739 Peripheral vascular disease, unspecified: Secondary | ICD-10-CM | POA: Diagnosis not present

## 2022-09-29 DIAGNOSIS — I10 Essential (primary) hypertension: Secondary | ICD-10-CM

## 2022-09-29 DIAGNOSIS — R0609 Other forms of dyspnea: Secondary | ICD-10-CM

## 2022-09-29 DIAGNOSIS — E119 Type 2 diabetes mellitus without complications: Secondary | ICD-10-CM | POA: Diagnosis not present

## 2022-09-29 MED ORDER — AMLODIPINE BESYLATE 5 MG PO TABS: ORAL_TABLET | ORAL | 3 refills | Status: DC

## 2022-09-29 NOTE — Progress Notes (Addendum)
Office Visit    Patient Name: Alexander Choi Date of Encounter: 09/29/2022  Primary Care Provider:  Nicoletta Dress, MD Primary Cardiologist:  Peter [name], MD  Chief Complaint    89 year old male with a history of CAD s/p CABG x 3 (LIMA to LAD, SVG to mid and distal OM, SVG to AM) in 2006, paroxysmal atrial tachycardia, mild aortic stenosis, hypertension, hyperlipidemia, PAD, type 2 diabetes, and GERD who presents for follow-up related to CAD.  Past Medical History    Past Medical History:  Diagnosis Date   Cancer    Chronic kidney disease    kidney stones   Coronary artery disease    with CABG in 2006 with LIMA to LAD, SVG to mid and distal OM, SVG to AM. Normal Myoview in May of 2013   Diabetes mellitus    Type 2   GERD (gastroesophageal reflux disease)    Hematuria    Hypercholesterolemia    Hypertension    Irritable bowel syndrome    Loose stools    Prostate cancer    Urinary obstruction    Past Surgical History:  Procedure Laterality Date   CHOLECYSTECTOMY     CORONARY ARTERY BYPASS GRAFT  2006   LEFT HEART CATH AND CORS/GRAFTS ANGIOGRAPHY N/A 04/09/2021   Procedure: LEFT HEART CATH AND CORS/GRAFTS ANGIOGRAPHY;  Surgeon: [name], Peter M, MD;  Location: [place] [facility];  Service: Cardiovascular;  Laterality: N/A;   LEFT HEART CATHETERIZATION WITH CORONARY/GRAFT ANGIOGRAM N/A 04/11/2014   Procedure: LEFT HEART CATHETERIZATION WITH [name];  Surgeon: Peter M [name], MD;  Location: [facility];  Service: Cardiovascular;  Laterality: N/A;   OTHER SURGICAL HISTORY     bowel obstruction   RADIOACTIVE SEED IMPLANT     for prostate cancer   RIGHT/LEFT HEART CATH AND CORONARY/GRAFT ANGIOGRAPHY N/A 12/12/2019   Procedure: RIGHT/LEFT HEART CATH AND CORONARY/GRAFT ANGIOGRAPHY;  Surgeon: [name], Peter M, MD;  Location: [place] [facility];  Service: Cardiovascular;  Laterality: N/A;   SKIN CANCER EXCISION     TEMPORARY PACEMAKER N/A 12/13/2019    Procedure: TEMPORARY PACEMAKER;  Surgeon: [name], Peter M, MD;  Location: [place] [facility];  Service: Cardiovascular;  Laterality: N/A;    Allergies  Allergies  Allergen Reactions   Ace Inhibitors Other (See Comments)    unknown   Nitrates, Organic Swelling    Swelling in hands   Shellfish Allergy Nausea Only    Pt states scallops-not all shellfish-GI symptoms/ nausea -50+ years ago   Penicillins Rash     Labs/Other Studies Reviewed    The following studies were reviewed today: Heart monitor 01/15/20: Normal sinus rhythm Occasional PACs. Occasional brief runs of PAT longest lasting 18.5 seconds.    Staged PCI 12/13/19: Dist LAD-1 lesion is 60% stenosed. Prox Cx lesion is 99% stenosed. Prox Cx to Mid Cx lesion is 90% stenosed. 2nd Mrg lesion is 75% stenosed. Unsuccessful PCI due to inability to cross with a wire.   1. Unsuccessful PCI of the LCx due to inability to cross the lesion with a wire.    Plan: medical therapy. ASA only. Will increase amlodipine to 5 mg daily. Consider Ranexa if symptoms persists. Anticipate DC tomorrow am.   Heart cath 12/12/19: Mid LM lesion is 35% stenosed. Ost LAD to Mid LAD lesion is 100% stenosed. Prox Cx lesion is 99% stenosed. Prox Cx to Mid Cx lesion is 90% stenosed. 1st Mrg lesion is 100% stenosed. 2nd Mrg lesion is 75% stenosed.  Prox RCA to Dist RCA lesion is 100% stenosed. LIMA graft was visualized by angiography and is normal in caliber. The graft exhibits no disease. Dist LAD-1 lesion is 60% stenosed. Dist LAD-2 lesion is 90% stenosed. SVG graft was visualized by angiography and is large. The graft exhibits minimal luminal irregularities. SVG graft was visualized by angiography and is normal in caliber. The graft exhibits mild . The left ventricular systolic function is normal. LV end diastolic pressure is normal. The left ventricular ejection fraction is 55-65% by visual estimate.   1. Complex 3 vessel obstructive CAD.   2. Patent LIMA to the LAD. There is severe disease in the distal LAD. The vessel is small in caliber and not suitable for PCI 3. Patent SVG to OM1 4. Patent SVG to RV marginal branch. The RCA is occluded with left to right collaterals from the LCx to the distal RCA 5. Normal LV function 6. Normal LV filling pressures 7. Normal right heart pressures   Plan: The native LCX is severely diseased proximally and in the mid vessel with aneurysmal dilation and heavy calcification. This supplies the second and third OM branches and collaterals to the distal RCA. This is a potential target for intervention. It will require atherectomy. Given progressive symptoms will admit to telemetry. Start IV heparin. Load with Plavix. Planned staged complex PCI of the LCx tomorrow.    Cardiac cath 04/09/21:      Mid LM lesion is 35% stenosed.   Ost LAD to Mid LAD lesion is 100% stenosed.   Dist LAD-1 lesion is 60% stenosed.   Dist LAD-2 lesion is 90% stenosed.   Prox RCA to Dist RCA lesion is 100% stenosed.   1st Mrg lesion is 100% stenosed.   2nd Mrg lesion is 75% stenosed.   Prox Cx lesion is 99% stenosed.   Prox Cx to Mid Cx lesion is 95% stenosed.   LIMA and is normal in caliber.   and is normal in caliber.   and is large.   The graft exhibits no disease.   The graft exhibits minimal luminal irregularities.   The graft exhibits mild .   The left ventricular systolic function is normal.   LV end diastolic pressure is normal.   The left ventricular ejection fraction is 55-65% by visual estimate.   Severe 3 vessel obstructive CAD Continued patency of bypass grafts including LIMA to LAD, SVG to OM1, SVG to RV marginal vessel Normal LV function Normal LVEDP   Plan: I suspect his culprit is the native LCx. This vessel is severely diseased, calcified and aneurysmal. Flow appears worse compared to prior study. We have attempted PCI of this in the past but unable to cross with a wire. There are no targets  for PCI. Will continue medical therapy. Intolerant of higher Ranexa dose. Will increase amlodipine.    Echo 04/10/21:   IMPRESSIONS    1. Left ventricular ejection fraction, by estimation, is 60 to 65%. The  left ventricle has normal function. The left ventricle has no regional  wall motion abnormalities. Left ventricular diastolic parameters are  consistent with Grade I diastolic  dysfunction (impaired relaxation).   2. Right ventricular systolic function is normal. The right ventricular  size is normal.   3. The mitral valve is abnormal. Trivial mitral valve regurgitation. No  evidence of mitral stenosis.   4. The aortic valve was not well visualized. Aortic valve regurgitation  is not visualized. Mild to moderate aortic valve sclerosis/calcification  is present,  without any evidence of aortic stenosis.   5. The inferior vena cava is normal in size with greater than 50%  respiratory variability, suggesting right atrial pressure of 3 mmHg.   Recent Labs: No results found for requested labs within last 365 days.  Recent Lipid Panel    Component Value Date/Time   CHOL 95 04/10/2021 0231   TRIG 102 04/10/2021 0231   HDL 27 (L) 04/10/2021 0231   CHOLHDL 3.5 04/10/2021 0231   VLDL 20 04/10/2021 0231   Clark 48 04/10/2021 0231    History of Present Illness    89 year old male with the above past medical history including CAD s/p CABG x 3 (LIMA to LAD, SVG to mid and distal OM, SVG to AM) in 2006, paroxysmal atrial tachycardia, mild aortic stenosis, hypertension, hyperlipidemia, PAD, type 2 diabetes, and GERD.  Myoview in 2015 showed inferior ischemia.  Follow-up cardiac catheterization showed all grafts patent with severe disease in the native left circumflex that predated his CABG.  He was hospitalized again in October 2020. Cardiac catheterization in 2020 the setting of chest pain revealed elevated right heart pressures, stable coronary anatomy with attempt to open chronically  occluded left circumflex, however, this was unsuccessful due to inability to cross the lesion with a wire. ABIs and arterial duplex in 2022 revealed PAD, occluded SFAs.  He was hospitalized October 2022 in the setting of NSTEMI.  Repeat cardiac catheterization showed continued patency of 3 of 3 grafts, severely diseased, calcified, and aneurysmal left circumflex artery. Flow appeared worse compared to prior study in the left circumflex, however, there are no targets for PCI.  Ongoing medical therapy was recommended.  He has not tolerated higher doses of Ranexa.  Additionally, he has not tolerated nitrates in the past due to side effects.  He was last seen in office on 08/08/2022 was stable from a cardiac standpoint.  He was referred to vascular surgery by wound care clinic due to nonhealing wound to his right foot.  Peripheral angiogram demonstrated long segment femoropopliteal disease with anterior tibial artery reconstitution, he was not considered a candidate for percutaneous intervention.  He was hospitalized in March 2023, records not available for review.  Echocardiogram at [facility] in 08/2022 showed EF 50 to 55%, basal inferior wall hypokinesis, impaired diastolic dysfunction, normal RV systolic function, mild aortic stenosis.  He presents today for follow-up. Since his last visit he has been stable overall from a cardiac standpoint.  He notes that he was hospitalized approximately 2 weeks ago at [facility] in the setting of generalized weakness.  Echocardiogram was completed, results as above.  He was discharged to [facility].  Since his discharge he continues to note generalized weakness. Over the past 2 to 3 days he has had increased dyspnea on exertion.  It seems that this may be similar to his prior anginal equivalent.  He denies chest pain.  He reports weight loss, poor oral intake, occasional lightheadedness.  He also has stable nonpitting bilateral lower extremity edema, he  denies PND, orthopnea.    Home Medications    Current Outpatient Medications  Medication Sig Dispense Refill   Accu-Chek Softclix Lancets lancets      albuterol (VENTOLIN HFA) 108 (90 Base) MCG/ACT inhaler Inhale 2 puffs into the lungs every 6 (six) hours as needed for wheezing or shortness of breath. 8 g 2   amLODipine (NORVASC) 5 MG tablet Take 2 tablets (10 mg ) once daily as directed. 180 tablet 3  aspirin 81 MG tablet Take 81 mg by mouth daily.     atorvastatin (LIPITOR) 80 MG tablet Take 1 tablet (80 mg total) by mouth daily. 90 tablet 3   BREZTRI AEROSPHERE 160-9-4.8 MCG/ACT AERO Inhale 2 puffs into the lungs 2 (two) times daily.     dapagliflozin propanediol (FARXIGA) 10 MG TABS tablet Take 10 mg by mouth daily.     famotidine (PEPCID) 40 MG tablet Take 40 mg by mouth daily.     ferrous sulfate 324 (65 Fe) MG TBEC Take 1 tablet by mouth daily.     finasteride (PROSCAR) 5 MG tablet Take 5 mg by mouth daily.     Fluticasone Propionate 0.05 % LOTN Apply topically once a week.     furosemide (LASIX) 40 MG tablet Take 40 mg by mouth every other day.     gabapentin (NEURONTIN) 100 MG capsule Take 100 mg by mouth 2 (two) times daily.     GEMTESA 75 MG TABS Take 1 tablet by mouth daily as needed.     glucose blood (ACCU-CHEK AVIVA PLUS) test strip      Ketotifen Fumarate (ITCHY EYE DROPS OP) Apply 2 drops to eye daily as needed (itchyness).     levothyroxine (SYNTHROID) 100 MCG tablet Take 1 tablet (100 mcg total) by mouth daily before breakfast.     Magnesium 200 MG CHEW Chew by mouth.     metFORMIN (GLUCOPHAGE) 500 MG tablet Take 1 tablet (500 mg total) by mouth 2 (two) times daily with a meal. Resume on 04/13/21.     mupirocin ointment (BACTROBAN) 2 % Apply topically as needed for rash.     nitroGLYCERIN (NITROSTAT) 0.4 MG SL tablet Place 1 tablet (0.4 mg total) under the tongue every 5 (five) minutes as needed for chest pain. 90 tablet 3   ondansetron (ZOFRAN) 4 MG tablet Take 1  tablet (4 mg total) by mouth every 8 (eight) hours as needed for nausea or vomiting. 20 tablet 0   pantoprazole (PROTONIX) 40 MG tablet Take 1 tablet (40 mg total) by mouth daily. 30 tablet 0   Probiotic Product (PROBIOTIC-10 PO) Take by mouth.     ranolazine (RANEXA) 500 MG 12 hr tablet Take 1 tablet (500 mg total) by mouth 2 (two) times daily. 180 tablet 3   Turmeric (QC TUMERIC COMPLEX PO) Take by mouth.     vitamin B-12 (CYANOCOBALAMIN) 1000 MCG tablet Take 1 tablet (1,000 mcg total) by mouth daily.     acetaminophen (TYLENOL) 500 MG tablet Take 1,000 mg by mouth every 6 (six) hours as needed for moderate pain or mild pain. (Patient not taking: Reported on 09/29/2022)     clindamycin (CLEOCIN) 300 MG capsule Take 300 mg by mouth 3 (three) times daily. (Patient not taking: Reported on 09/29/2022)     glimepiride (AMARYL) 2 MG tablet Take 2 mg by mouth daily with breakfast. Take 1 tablet in the am and half a tablet in the pm (Patient not taking: Reported on 09/29/2022)     losartan (COZAAR) 100 MG tablet Take 100 mg by mouth daily. (Patient not taking: Reported on 09/29/2022)     meclizine (ANTIVERT) 25 MG tablet Take 25 mg by mouth 3 (three) times daily as needed for dizziness. (Patient not taking: Reported on 09/29/2022) 30 tablet 0   No current facility-administered medications for this visit.     Review of Systems    He denies chest pain, palpitations, pnd, orthopnea, n, v, syncope, weight gain, or  early satiety. All other systems reviewed and are otherwise negative except as noted above.   Physical Exam    VS:  BP (!) 116/48 (BP Location: Left Arm, Patient Position: Sitting, Cuff Size: Normal)   Pulse 60   Ht 5\' 10"  (1.778 m)   Wt 168 lb (76.2 kg)   SpO2 96%   BMI 24.11 kg/m  GEN: Well nourished, well developed, in no acute distress. HEENT: normal. Neck: Supple, no JVD, carotid bruits, or masses. Cardiac: RRR, no murmurs, rubs, or gallops. No clubbing, cyanosis, nonpitting bilateral lower  extremity edema.  Radials/DP/PT 2+ and equal bilaterally.  Respiratory:  Respirations regular and unlabored, clear to auscultation bilaterally. GI: Soft, nontender, nondistended, BS + x 4. MS: no deformity or atrophy. Skin: warm and dry, no rash. Neuro:  Strength and sensation are intact. Psych: Normal affect.  Accessory Clinical Findings    ECG personally reviewed by me today -NSR, 60 bpm - no acute changes.   Lab Results  Component Value Date   WBC 5.4 06/06/2021   HGB 9.5 (L) 06/06/2021   HCT 28.4 (L) 06/06/2021   MCV 95.9 06/06/2021   PLT 178 06/06/2021   Lab Results  Component Value Date   CREATININE 0.77 06/07/2021   BUN 26 (H) 06/07/2021   NA 134 (L) 06/07/2021   K 3.7 06/07/2021   CL 96 (L) 06/07/2021   CO2 30 06/07/2021   Lab Results  Component Value Date   ALT 19 06/04/2021   AST 21 06/04/2021   ALKPHOS 67 06/04/2021   BILITOT 0.9 06/04/2021   Lab Results  Component Value Date   CHOL 95 04/10/2021   HDL 27 (L) 04/10/2021   LDLCALC 48 04/10/2021   TRIG 102 04/10/2021   CHOLHDL 3.5 04/10/2021    Lab Results  Component Value Date   HGBA1C 7.0 (H) 06/05/2021    Assessment & Plan   1. CAD/dyspnea on exertion: s/p CABG x 3 (LIMA to LAD, SVG to mid and distal OM, SVG to AM) in 2006.  Most recent cath in 2022 showed continued patency of 3 of 3 grafts, severely diseased, calcified, and aneurysmal left circumflex artery. Flow appeared worse compared to prior study in the left circumflex, however, there are no targets for PCI.  He notes a recent increase in dyspnea on exertion, similar to prior anginal equivalent.  He has stable nonpitting bilateral lower extremity edema.  Options are limited for medical management as he has not tolerated higher doses of Ranexa in the past.  Additionally, he has an allergy to nitrates.  Will increase amlodipine to 10 mg daily.  Will check CBC, CMET, BNP today.  Will request records from most recent hospitalization at [facility].  Will discuss possible need for ischemic evaluation with Dr. [name], will defer for now.  Discussed ED precautions.  Continue aspirin, amlodipine as above, Ranexa, and Lipitor.  2. Aortic stenosis: Recent echo at [facility] in in 08/2022 showed EF 50 to 55%, basal inferior wall hypokinesis, impaired diastolic dysfunction, normal RV systolic function, mild aortic stenosis.  He has had recent dyspnea on exertion, occasional lightheadedness, denies presyncope, syncope.  Will continue to monitor with routine echocardiogram echocardiogram.  3. Hypertension: BP well controlled.  Increase amlodipine as above, otherwise, continue current antihypertensive regimen.   4. Hyperlipidemia: LDL was 70 in 07/2022.  Continue Lipitor.  5. Paroxysmal atrial tachycardia: Denies any recent palpitations.  EKG stable in office today.  6. PAD: Follows with vascular surgery, denies worsening claudication.  Continue aspirin, Lipitor.  7. Type 2 diabetes: A1c was 8.4 in 07/2022.  Monitored and managed per PCP.  8. Disposition: Follow-up in 1 month.      Lenna Sciara, NP 09/29/2022, 1:27 PM

## 2022-09-29 NOTE — Patient Instructions (Addendum)
Medication Instructions:  Increase Amlodipine 10 mg daily  *If you need a refill on your cardiac medications before your next appointment, please call your pharmacy*   Lab Work: Your physician recommends that you complete lab work today. BNP, BNP, CMET   If you have labs (blood work) drawn today and your tests are completely normal, you will receive your results only by: MyChart Message (if you have MyChart) OR A paper copy in the mail If you have any lab test that is abnormal or we need to change your treatment, we will call you to review the results.   Testing/Procedures: NONE ordered at this time of appointment     Follow-Up: At [facility], you and your health needs are our priority.  As part of our continuing mission to provide you with exceptional heart care, we have created designated Provider Care Teams.  These Care Teams include your primary Cardiologist (physician) and Advanced Practice Providers (APPs -  Physician Assistants and Nurse Practitioners) who all work together to provide you with the care you need, when you need it.  We recommend signing up for the patient portal called "MyChart".  Sign up information is provided on this After Visit Summary.  MyChart is used to connect with patients for Virtual Visits (Telemedicine).  Patients are able to view lab/test results, encounter notes, upcoming appointments, etc.  Non-urgent messages can be sent to your provider as well.   To learn more about what you can do with MyChart, go to [email or web address].    Your next appointment:   1 month(s)  Provider:   Dr. [name] or Diona Browner, NP  Other Instructions

## 2022-09-30 LAB — COMPREHENSIVE METABOLIC PANEL
ALT: 31 IU/L (ref 0–44)
AST: 28 IU/L (ref 0–40)
Albumin/Globulin Ratio: 1.1 — ABNORMAL LOW (ref 1.2–2.2)
Albumin: 3.5 g/dL — ABNORMAL LOW (ref 3.7–4.7)
Alkaline Phosphatase: 116 IU/L (ref 44–121)
BUN/Creatinine Ratio: 24 (ref 10–24)
BUN: 21 mg/dL (ref 8–27)
Bilirubin Total: 0.4 mg/dL (ref 0.0–1.2)
CO2: 21 mmol/L (ref 20–29)
Calcium: 9.1 mg/dL (ref 8.6–10.2)
Chloride: 98 mmol/L (ref 96–106)
Creatinine, Ser: 0.86 mg/dL (ref 0.76–1.27)
Globulin, Total: 3.2 g/dL (ref 1.5–4.5)
Glucose: 215 mg/dL — ABNORMAL HIGH (ref 70–99)
Potassium: 5 mmol/L (ref 3.5–5.2)
Sodium: 139 mmol/L (ref 134–144)
Total Protein: 6.7 g/dL (ref 6.0–8.5)
eGFR: 84 mL/min/{1.73_m2} (ref >59)

## 2022-09-30 LAB — CBC
Hematocrit: 35.5 % — ABNORMAL LOW (ref 37.5–51.0)
Hemoglobin: 11.5 g/dL — ABNORMAL LOW (ref 13.0–17.7)
MCH: 31 pg (ref 26.6–33.0)
MCHC: 32.4 g/dL (ref 31.5–35.7)
MCV: 96 fL (ref 79–97)
Platelets: 386 10*3/uL (ref 150–450)
RBC: 3.71 x10E6/uL — ABNORMAL LOW (ref 4.14–5.80)
RDW: 12.3 % (ref 11.6–15.4)
WBC: 10 10*3/uL (ref 3.4–10.8)

## 2022-09-30 LAB — BRAIN NATRIURETIC PEPTIDE: BNP: 145.3 pg/mL — ABNORMAL HIGH (ref 0.0–100.0)

## 2022-10-03 DIAGNOSIS — E11621 Type 2 diabetes mellitus with foot ulcer: Secondary | ICD-10-CM | POA: Diagnosis not present

## 2022-10-03 DIAGNOSIS — L97412 Non-pressure chronic ulcer of right heel and midfoot with fat layer exposed: Secondary | ICD-10-CM | POA: Diagnosis not present

## 2022-10-03 DIAGNOSIS — E1151 Type 2 diabetes mellitus with diabetic peripheral angiopathy without gangrene: Secondary | ICD-10-CM | POA: Diagnosis not present

## 2022-10-03 DIAGNOSIS — E1169 Type 2 diabetes mellitus with other specified complication: Secondary | ICD-10-CM | POA: Diagnosis not present

## 2022-10-03 DIAGNOSIS — L89623 Pressure ulcer of left heel, stage 3: Secondary | ICD-10-CM | POA: Diagnosis not present

## 2022-10-03 DIAGNOSIS — M86171 Other acute osteomyelitis, right ankle and foot: Secondary | ICD-10-CM | POA: Diagnosis not present

## 2022-10-03 DIAGNOSIS — L97516 Non-pressure chronic ulcer of other part of right foot with bone involvement without evidence of necrosis: Secondary | ICD-10-CM | POA: Diagnosis not present

## 2022-10-03 DIAGNOSIS — L97514 Non-pressure chronic ulcer of other part of right foot with necrosis of bone: Secondary | ICD-10-CM | POA: Diagnosis not present

## 2022-10-03 DIAGNOSIS — E11610 Type 2 diabetes mellitus with diabetic neuropathic arthropathy: Secondary | ICD-10-CM | POA: Diagnosis not present

## 2022-10-04 DIAGNOSIS — U071 COVID-19: Secondary | ICD-10-CM | POA: Diagnosis not present

## 2022-10-04 DIAGNOSIS — R531 Weakness: Secondary | ICD-10-CM | POA: Diagnosis not present

## 2022-10-04 DIAGNOSIS — R0789 Other chest pain: Secondary | ICD-10-CM | POA: Diagnosis not present

## 2022-10-04 DIAGNOSIS — I10 Essential (primary) hypertension: Secondary | ICD-10-CM | POA: Diagnosis not present

## 2022-10-05 ENCOUNTER — Telehealth: Payer: Self-pay

## 2022-10-05 NOTE — Telephone Encounter (Signed)
Left a detailed message with lab results. Pt was advised to continue his current medication and follow up as planned.

## 2022-10-11 DIAGNOSIS — I739 Peripheral vascular disease, unspecified: Secondary | ICD-10-CM | POA: Diagnosis not present

## 2022-10-11 DIAGNOSIS — L97519 Non-pressure chronic ulcer of other part of right foot with unspecified severity: Secondary | ICD-10-CM | POA: Diagnosis not present

## 2022-10-11 DIAGNOSIS — M86171 Other acute osteomyelitis, right ankle and foot: Secondary | ICD-10-CM | POA: Diagnosis not present

## 2022-10-12 ENCOUNTER — Other Ambulatory Visit: Payer: Self-pay | Admitting: Cardiology

## 2022-10-14 ENCOUNTER — Telehealth: Payer: Self-pay | Admitting: *Deleted

## 2022-10-14 DIAGNOSIS — E11621 Type 2 diabetes mellitus with foot ulcer: Secondary | ICD-10-CM | POA: Diagnosis not present

## 2022-10-14 DIAGNOSIS — M86171 Other acute osteomyelitis, right ankle and foot: Secondary | ICD-10-CM | POA: Diagnosis not present

## 2022-10-14 DIAGNOSIS — L97412 Non-pressure chronic ulcer of right heel and midfoot with fat layer exposed: Secondary | ICD-10-CM | POA: Diagnosis not present

## 2022-10-14 DIAGNOSIS — L97516 Non-pressure chronic ulcer of other part of right foot with bone involvement without evidence of necrosis: Secondary | ICD-10-CM | POA: Diagnosis not present

## 2022-10-14 DIAGNOSIS — L89623 Pressure ulcer of left heel, stage 3: Secondary | ICD-10-CM | POA: Diagnosis not present

## 2022-10-14 DIAGNOSIS — E1169 Type 2 diabetes mellitus with other specified complication: Secondary | ICD-10-CM | POA: Diagnosis not present

## 2022-10-14 NOTE — Progress Notes (Signed)
  Care Coordination Note  10/14/2022 Name: Alexander Choi MRN: [id number] DOB: 06/21/1933  Alexander Choi is a 89 y.o. year old male who is a primary care patient of Paulina Fusi, MD and is actively engaged with the care management team. I reached out to [name] by phone today to assist with re-scheduling a follow up visit with the RN Case Manager  Follow up plan: Per pt son [name] [facility]) pt is [facility] - requests we contact them back in 1 month  Burman Nieves, [facility] Care Coordination Care Guide Direct Dial: [phone]

## 2022-10-15 DIAGNOSIS — E1151 Type 2 diabetes mellitus with diabetic peripheral angiopathy without gangrene: Secondary | ICD-10-CM | POA: Diagnosis not present

## 2022-10-15 DIAGNOSIS — I2510 Atherosclerotic heart disease of native coronary artery without angina pectoris: Secondary | ICD-10-CM | POA: Diagnosis not present

## 2022-10-15 DIAGNOSIS — M869 Osteomyelitis, unspecified: Secondary | ICD-10-CM | POA: Diagnosis not present

## 2022-10-15 DIAGNOSIS — U071 COVID-19: Secondary | ICD-10-CM | POA: Diagnosis not present

## 2022-10-21 DIAGNOSIS — L97514 Non-pressure chronic ulcer of other part of right foot with necrosis of bone: Secondary | ICD-10-CM | POA: Diagnosis not present

## 2022-10-21 DIAGNOSIS — E1151 Type 2 diabetes mellitus with diabetic peripheral angiopathy without gangrene: Secondary | ICD-10-CM | POA: Diagnosis not present

## 2022-10-21 DIAGNOSIS — E11621 Type 2 diabetes mellitus with foot ulcer: Secondary | ICD-10-CM | POA: Diagnosis not present

## 2022-10-21 DIAGNOSIS — L97516 Non-pressure chronic ulcer of other part of right foot with bone involvement without evidence of necrosis: Secondary | ICD-10-CM | POA: Diagnosis not present

## 2022-10-21 DIAGNOSIS — L97422 Non-pressure chronic ulcer of left heel and midfoot with fat layer exposed: Secondary | ICD-10-CM | POA: Diagnosis not present

## 2022-10-21 DIAGNOSIS — E1169 Type 2 diabetes mellitus with other specified complication: Secondary | ICD-10-CM | POA: Diagnosis not present

## 2022-10-21 DIAGNOSIS — L97412 Non-pressure chronic ulcer of right heel and midfoot with fat layer exposed: Secondary | ICD-10-CM | POA: Diagnosis not present

## 2022-10-21 DIAGNOSIS — M86171 Other acute osteomyelitis, right ankle and foot: Secondary | ICD-10-CM | POA: Diagnosis not present

## 2022-10-21 DIAGNOSIS — L89623 Pressure ulcer of left heel, stage 3: Secondary | ICD-10-CM | POA: Diagnosis not present

## 2022-10-24 ENCOUNTER — Encounter: Payer: Self-pay | Admitting: Neurology

## 2022-10-24 ENCOUNTER — Ambulatory Visit: Payer: Medicare PPO | Admitting: Neurology

## 2022-10-24 VITALS — BP 95/60 | HR 76 | Ht 70.0 in | Wt 168.0 lb

## 2022-10-24 DIAGNOSIS — R269 Unspecified abnormalities of gait and mobility: Secondary | ICD-10-CM

## 2022-10-24 DIAGNOSIS — R54 Age-related physical debility: Secondary | ICD-10-CM | POA: Diagnosis not present

## 2022-10-24 DIAGNOSIS — I214 Non-ST elevation (NSTEMI) myocardial infarction: Secondary | ICD-10-CM | POA: Diagnosis not present

## 2022-10-24 DIAGNOSIS — I5032 Chronic diastolic (congestive) heart failure: Secondary | ICD-10-CM | POA: Diagnosis not present

## 2022-10-24 DIAGNOSIS — R2681 Unsteadiness on feet: Secondary | ICD-10-CM | POA: Diagnosis not present

## 2022-10-24 DIAGNOSIS — G6289 Other specified polyneuropathies: Secondary | ICD-10-CM

## 2022-10-24 DIAGNOSIS — M6281 Muscle weakness (generalized): Secondary | ICD-10-CM | POA: Diagnosis not present

## 2022-10-24 DIAGNOSIS — E119 Type 2 diabetes mellitus without complications: Secondary | ICD-10-CM | POA: Diagnosis not present

## 2022-10-24 DIAGNOSIS — I739 Peripheral vascular disease, unspecified: Secondary | ICD-10-CM | POA: Diagnosis not present

## 2022-10-24 DIAGNOSIS — R2689 Other abnormalities of gait and mobility: Secondary | ICD-10-CM | POA: Diagnosis not present

## 2022-10-24 DIAGNOSIS — M86171 Other acute osteomyelitis, right ankle and foot: Secondary | ICD-10-CM | POA: Diagnosis not present

## 2022-10-24 NOTE — Progress Notes (Signed)
Chief Complaint  Patient presents with   Follow-up    Rm 14 Mixed axonal-demyelinating neuropathy   Gait abnormality   ASSESSMENT AND PLAN  Alexander Choi is a 89 y.o. male   Peripheral neuropathy Diabetic ulcer of right foot Type 2 diabetes -Progressive lower extremity sensory loss, weakness, gait abnormalities,  -EMG nerve conduction study on August 11, 2021  showed evidence of peripheral neuropathy, with mixed axonal and demyelinating features, as evidenced by prolonged F-wave latency at bilateral ulnar motor responses, there was only mild active denervation at distal leg muscles  -Less likely to be explained by his diabetes, which has always been under good control, A1c was less than 7  Examination showed significant deconditioning of the patient, generalized lower extremity muscle atrophy, bilateral foot drop, areflexia, positive  right first dorsal interossei muscle atrophy, weakness,    -Laboratory evaluation showed no treatable etiology  -MRI of the cervical spine showed mild degenerative changes, mild foraminal stenosis at C4-5  -MRI lumbar spine showed degenerative changes, mild foraminal stenosis at L3-4, L4-5 I have discussed with patient and his son about possibility of lumbar puncture, potential IVIG treatment, but because of his aging, slow progressive course, significant deconditioning, poor functional status, even with IVIG treatment, may only has slow improvement, His son will discuss with family members call back,   Continue physical therapy   DIAGNOSTIC DATA (LABS, IMAGING, TESTING) - I reviewed patient records, labs, notes, testing and imaging myself where available.  MRI cervical spine without contrast demonstrating: - At C4-5 uncovertebral joint and facet hypertrophy with mild spinal stenosis and mild bilateral foraminal stenosis. - At C5-6 disc bulging with mild spinal stenosis and no foraminal stenosis.  MRI lumbar spine (without)  demonstrating: - At L4-5: disc bulging and facet hypertrophy with mild right and moderate left foraminal stenosis. - At L3-4: disc bulging and facet hypertrophy with mild biforaminal stenosis.  MEDICAL HISTORY:  Alexander Choi is a 89 year old male, seen in request by his primary care physician Dr. Paulina Fusi, for evaluation of fall, following up for hospital discharge on June 07, 2021,  I reviewed and summarized the referring note. PMHX. HLD DM  CKD CAD Prostate Cancer  Patient is hard of hearing, patient uses walker at home, I reviewed hospital record on December 9-12 2022, after napping in his recliner for few hours, while trying to get up, he felt lightheaded, his l legs gave out underneath him, he fell, could not get up, no loss of consciousness, EMS was called, was reported that he was confused,  I personally reviewed CT head, MRI of the brain without contrast on June 04, 2021: No acute abnormality, no evidence of stroke, mild small vessel disease  CT angiogram of head and neck showed no large vessel disease Laboratory evaluation showed A1c 7.0, CBC showed hemoglobin of 9.5, normal BMP, creatinine of 0.86, negative troponin, normal TSH, ammonia,  He does have long history of diabetes, bilateral feet numbness, 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  As evident by prolonged bilateral ulnar F-wave latency, with only mild active denervation  at distal left muscles, this would not explain explained by his well-controlled diabetes, A1c was always under 7  On examination he has distal weakness, bilateral foot drop, positive Romberg signs, areflexia, also have urinary urgency, occasionally incontinence,  UPDATE October 18 2021: He lives by himself, has two sons live close to him, did not have MRI cervical and lumbar  I failed to contact his 2 sons by calling listed phone numbers Laboratory evaluation showed normal ANA, RPR, ESR C-reactive protein, normal protein electrophoresis  UPDATE October 24 2022: He is accompanied by his son at today's clinical visit, he moved to a facility in March 2024, he has increased gait abnormality, could not walk, he has trouble with urinary incontinence, irritable bowel syndrome, wear diaper, bowel incontinence sometimes,.  He does not have strength to walk anymore, increased lower extremity weakness, some hands weakness  Personally reviewed MRI cervical spine in June 2023 showed mild degenerative changes there was no evidence of spinal cord compression, there was mild spinal stenosis at C4-5  MRI lumbar spine in June 2023 showed degenerative changes, no significant stenosis  PHYSICAL EXAM:   Today's Vitals   10/24/22 1500  BP: 95/60  Pulse: 76  Weight: 167 lb 15.9 oz (76.2 kg)  Height: 5\' 10"  (1.778 m)   Body mass index is 24.1 kg/m.   Physical Exam  General: The patient is alert and cooperative at the time of the examination.  Hard of hearing.  Skin: Swelling to right lower extremity, wearing soft cast with post-op shoe for wound  Neurologic Exam  Mental status: The patient is alert and oriented x 3 at the time of the examination. The patient has apparent normal recent and remote memory, with an apparently normal attention span and concentration ability.  Cranial nerves: Facial symmetry is present. Speech is normal, no aphasia or dysarthria is noted. Extraocular movements are full. Visual  fields are full.  Motor: Right first dorsal interossei muscle mild atrophy, mild finger abduction, grip weakness bilaterally, mild to moderate bilateral hip flexion weakness, right leg weaker than right from soft cast, mild to moderate right ankle dorsiflexion weakness, diffuse lower extremity muscle atrophy  Sensory examination: Length dependent soft touch sensation to upper and lower extremities to knee level/wrist level  Coordination: The patient has good finger-nose-finger bilaterally, difficulty performing heel-to-shin  Gait and station: Needs assistance to get up from seated position, flailed foot, difficulty to initiate gait,  Reflexes: are decreased to absent  REVIEW OF SYSTEMS:  Full 14 system review of systems performed and notable only for as above  See HPI  ALLERGIES: Allergies  Allergen Reactions   Ace Inhibitors Other (See Comments)    unknown   Nitrates, Organic Swelling    Swelling in hands   Shellfish Allergy Nausea Only    Pt states scallops-not all shellfish-GI symptoms/ nausea -50+ years ago   Penicillins Rash    HOME MEDICATIONS: Current Outpatient Medications  Medication Sig Dispense Refill   Accu-Chek Softclix Lancets lancets      acetaminophen (TYLENOL) 500 MG tablet Take 1,000 mg by mouth every 6 (six) hours as needed for moderate pain or mild pain.     albuterol (VENTOLIN HFA) 108 (90 Base) MCG/ACT inhaler Inhale 2 puffs into the lungs every 6 (six) hours as needed for wheezing or shortness of breath. 8 g 2   amLODipine (NORVASC) 5 MG tablet Take 2 tablets (10 mg ) once  daily as directed. 180 tablet 3   aspirin 81 MG tablet Take 81 mg by mouth daily.     atorvastatin (LIPITOR) 80 MG tablet Take 1 tablet (80 mg total) by mouth daily. 90 tablet 3   BREZTRI AEROSPHERE 160-9-4.8 MCG/ACT AERO Inhale 2 puffs into the lungs 2 (two) times daily.     dapagliflozin propanediol (FARXIGA) 10 MG TABS tablet Take 10 mg by mouth daily.     famotidine (PEPCID) 40 MG  tablet Take 40 mg by mouth daily.     ferrous sulfate 324 (65 Fe) MG TBEC Take 1 tablet by mouth daily.     finasteride (PROSCAR) 5 MG tablet Take 5 mg by mouth daily.     furosemide (LASIX) 40 MG tablet Take 40 mg by mouth every other day.     gabapentin (NEURONTIN) 100 MG capsule Take 100 mg by mouth 2 (two) times daily.     glucose blood (ACCU-CHEK AVIVA PLUS) test strip      levothyroxine (SYNTHROID) 100 MCG tablet Take 1 tablet (100 mcg total) by mouth daily before breakfast.     metFORMIN (GLUCOPHAGE) 500 MG tablet Take 1 tablet (500 mg total) by mouth 2 (two) times daily with a meal. Resume on 04/13/21.     ondansetron (ZOFRAN) 4 MG tablet Take 1 tablet (4 mg total) by mouth every 8 (eight) hours as needed for nausea or vomiting. 20 tablet 0   pantoprazole (PROTONIX) 40 MG tablet Take 1 tablet (40 mg total) by mouth daily. 30 tablet 0   ranolazine (RANEXA) 500 MG 12 hr tablet TAKE 1 TABLET TWICE DAILY 180 tablet 3   vitamin B-12 (CYANOCOBALAMIN) 1000 MCG tablet Take 1 tablet (1,000 mcg total) by mouth daily.     No current facility-administered medications for this visit.    PAST MEDICAL HISTORY: Past Medical History:  Diagnosis Date   [facility]cer [facility])    Chronic kidney disease    kidney stones   Coronary artery disease    with CABG in 2006 with LIMA to LAD, SVG to mid and distal OM, SVG to AM. Normal Myoview in May of 2013   Diabetes mellitus    Type 2   GERD (gastroesophageal reflux disease)    Hematuria    Hypercholesterolemia    Hypertension    Irritable bowel syndrome    Loose stools    Prostate cancer (HCC)    Urinary obstruction     PAST SURGICAL HISTORY: Past Surgical History:  Procedure Laterality Date   CHOLECYSTECTOMY     CORONARY ARTERY BYPASS GRAFT  2006   LEFT HEART CATH AND CORS/GRAFTS ANGIOGRAPHY N/A 04/09/2021   Procedure: LEFT HEART CATH AND CORS/GRAFTS ANGIOGRAPHY;  Surgeon: Swaziland, Peter M, MD;  Location: [facility] INVASIVE CV LAB;  Service: Cardiovascular;   Laterality: N/A;   LEFT HEART CATHETERIZATION WITH CORONARY/GRAFT ANGIOGRAM N/A 04/11/2014   Procedure: LEFT HEART CATHETERIZATION WITH Isabel Caprice;  Surgeon: Peter M Swaziland, MD;  Location: [facility] CATH LAB;  Service: Cardiovascular;  Laterality: N/A;   OTHER SURGICAL HISTORY     bowel obstruction   RADIOACTIVE SEED IMPLANT     for prostate cancer   RIGHT/LEFT HEART CATH AND CORONARY/GRAFT ANGIOGRAPHY N/A 12/12/2019   Procedure: RIGHT/LEFT HEART CATH AND CORONARY/GRAFT ANGIOGRAPHY;  Surgeon: Swaziland, Peter M, MD;  Location: [facility] INVASIVE CV LAB;  Service: Cardiovascular;  Laterality: N/A;   SKIN CANCER EXCISION     TEMPORARY PACEMAKER N/A 12/13/2019   Procedure: TEMPORARY PACEMAKER;  Surgeon: Swaziland, Peter M,  MD;  Location: [facility] INVASIVE CV LAB;  Service: Cardiovascular;  Laterality: N/A;    FAMILY HISTORY: Family History  Problem Relation Age of Onset   Cancer Mother        Leukemia   Hypertension Mother    Cancer Father        Colon   Heart attack Father        Had Several in his 21's   Hypertension Father    Diabetes Father     SOCIAL HISTORY: Social History   Socioeconomic History   Marital status: Widowed    Spouse name: Not on file   Number of children: 3   Years of education: Not on file   Highest education level: Not on file  Occupational History   Occupation: state trooper    Comment: retired   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  Tobacco Use   Smoking status: Former    Packs/day: 3    Types: Cigarettes   Smokeless tobacco: Never  Vaping Use   Vaping Use: Never used  Substance and Sexual Activity   Alcohol use: No   Drug use: No   Sexual activity: Not Currently  Other Topics Concern   Not on file  Social History Narrative   Not on file   Social Determinants of Health   Financial Resource Strain: Low Risk  (05/05/2022)   Overall Financial Resource Strain (CARDIA)    Difficulty of Paying Living Expenses: Not hard at all  Food Insecurity: No Food Insecurity  (05/05/2022)   Hunger Vital Sign    Worried About Running Out of Food in the Last Year: Never true    Ran Out of Food in the Last Year: Never true  Transportation Needs: No Transportation Needs (05/05/2022)   PRAPARE - Administrator, Civil Service (Medical): No    Lack of Transportation (Non-Medical): No  Physical Activity: Inactive (05/05/2022)   Exercise Vital Sign    Days of Exercise per Week: 0 days    Minutes of Exercise per Session: 0 min  Stress: No Stress Concern Present (05/05/2022)   [employer] of Occupational Health - Occupational Stress Questionnaire    Feeling of Stress : Not at all  Social Connections: Unknown (05/05/2022)   Social Connection and Isolation Panel [NHANES]    Frequency of Communication with Friends and Family: More than three times a week    Frequency of Social Gatherings with Friends and Family: More than three times a week    Attends Religious Services: Not on file    Active Member of Clubs or Organizations: Not on file    Attends Banker Meetings: Not on file    Marital Status: Not on file  Intimate Partner Violence: Not At Risk (05/05/2022)   Humiliation, Afraid, Rape, and Kick questionnaire    Fear of Current or Ex-Partner: No    Emotionally Abused: No    Physically Abused: No    Sexually Abused: No   Levert Feinstein, M.D. Ph.D.  [facility] [address] [place], [place] [address] Phone: [phone] Fax:      [phone]

## 2022-10-25 ENCOUNTER — Telehealth: Payer: Self-pay | Admitting: Neurology

## 2022-10-25 DIAGNOSIS — M869 Osteomyelitis, unspecified: Secondary | ICD-10-CM | POA: Diagnosis not present

## 2022-10-25 DIAGNOSIS — M86171 Other acute osteomyelitis, right ankle and foot: Secondary | ICD-10-CM | POA: Diagnosis not present

## 2022-10-25 DIAGNOSIS — I739 Peripheral vascular disease, unspecified: Secondary | ICD-10-CM | POA: Diagnosis not present

## 2022-10-25 DIAGNOSIS — Z792 Long term (current) use of antibiotics: Secondary | ICD-10-CM | POA: Diagnosis not present

## 2022-10-25 NOTE — Telephone Encounter (Signed)
Pt along with Cynthia(not on DPR, she identified herself to be a friend of 15 years to pt. And his [employer] teacher ) Aram Beecham called with pt on speaker phone.  What was explained is that pt did not recall the name of the Dr he saw today nor was he very clear on the plan of care.  Pt recalls there was mention of a LP being done, Aram Beecham explained that pt would like better clarity on the reason for that and what exactly that is.  Pt's son Burman Bruington is on DPR, pt is asking that this son be called by RN re: the details of today's appointment and what test are suggested/upcoming for pt.  Dannielle Huh Rocco's cell#(which is best to call ) is [phone]

## 2022-10-25 NOTE — Telephone Encounter (Signed)
Call to son Wyatt Thorstenson to give clarification on 10/24/22 office visit. No answer. Left voicemail to call office.

## 2022-10-26 NOTE — Telephone Encounter (Signed)
Spoke to Alexander Choi, and explained the message from phone room and he states they are still discussing everything and he would be in contact with Alexander Choi today to update her. He states he would called back if he had any more questions. He thanked [place] for letting him know they reached out.

## 2022-10-27 DIAGNOSIS — B3741 Candidal cystitis and urethritis: Secondary | ICD-10-CM | POA: Diagnosis not present

## 2022-10-27 DIAGNOSIS — N390 Urinary tract infection, site not specified: Secondary | ICD-10-CM | POA: Diagnosis not present

## 2022-10-27 DIAGNOSIS — M6281 Muscle weakness (generalized): Secondary | ICD-10-CM | POA: Diagnosis not present

## 2022-10-27 DIAGNOSIS — R739 Hyperglycemia, unspecified: Secondary | ICD-10-CM | POA: Diagnosis not present

## 2022-10-27 DIAGNOSIS — R54 Age-related physical debility: Secondary | ICD-10-CM | POA: Diagnosis not present

## 2022-10-27 DIAGNOSIS — I739 Peripheral vascular disease, unspecified: Secondary | ICD-10-CM | POA: Diagnosis not present

## 2022-10-27 DIAGNOSIS — E119 Type 2 diabetes mellitus without complications: Secondary | ICD-10-CM | POA: Diagnosis not present

## 2022-10-27 DIAGNOSIS — I5032 Chronic diastolic (congestive) heart failure: Secondary | ICD-10-CM | POA: Diagnosis not present

## 2022-10-27 DIAGNOSIS — R110 Nausea: Secondary | ICD-10-CM | POA: Diagnosis not present

## 2022-10-27 DIAGNOSIS — M86171 Other acute osteomyelitis, right ankle and foot: Secondary | ICD-10-CM | POA: Diagnosis not present

## 2022-10-27 DIAGNOSIS — R2681 Unsteadiness on feet: Secondary | ICD-10-CM | POA: Diagnosis not present

## 2022-10-27 DIAGNOSIS — I214 Non-ST elevation (NSTEMI) myocardial infarction: Secondary | ICD-10-CM | POA: Diagnosis not present

## 2022-10-27 DIAGNOSIS — R2689 Other abnormalities of gait and mobility: Secondary | ICD-10-CM | POA: Diagnosis not present

## 2022-10-27 DIAGNOSIS — R531 Weakness: Secondary | ICD-10-CM | POA: Diagnosis not present

## 2022-10-29 DIAGNOSIS — R54 Age-related physical debility: Secondary | ICD-10-CM | POA: Diagnosis not present

## 2022-10-29 DIAGNOSIS — R2681 Unsteadiness on feet: Secondary | ICD-10-CM | POA: Diagnosis not present

## 2022-10-29 DIAGNOSIS — I5032 Chronic diastolic (congestive) heart failure: Secondary | ICD-10-CM | POA: Diagnosis not present

## 2022-10-29 DIAGNOSIS — E119 Type 2 diabetes mellitus without complications: Secondary | ICD-10-CM | POA: Diagnosis not present

## 2022-10-29 DIAGNOSIS — M6281 Muscle weakness (generalized): Secondary | ICD-10-CM | POA: Diagnosis not present

## 2022-10-29 DIAGNOSIS — I739 Peripheral vascular disease, unspecified: Secondary | ICD-10-CM | POA: Diagnosis not present

## 2022-10-29 DIAGNOSIS — I214 Non-ST elevation (NSTEMI) myocardial infarction: Secondary | ICD-10-CM | POA: Diagnosis not present

## 2022-10-29 DIAGNOSIS — M86171 Other acute osteomyelitis, right ankle and foot: Secondary | ICD-10-CM | POA: Diagnosis not present

## 2022-10-29 DIAGNOSIS — R2689 Other abnormalities of gait and mobility: Secondary | ICD-10-CM | POA: Diagnosis not present

## 2022-10-29 NOTE — Progress Notes (Unsigned)
Cardiology Office Note:    Date:  11/01/2022   ID:  [name], DOB 22-Aug-1932, MRN [id number]  PCP:  Paulina Fusi, MD  Cardiologist:  Otilia Kareem [place], MD   Referring MD: Paulina Fusi, MD   Chief Complaint  Patient presents with   Medication Management    Needs to discuss blood suGAR     History of Present Illness:    Alexander Choi is a 89 y.o. male with a hx of CAD status post CABG in 2006, Myoview in October 2015 that showed inferior ischemia and led to a heart catheterization that showed all grafts patent with severe disease in the native left circumflex that predated his CABG.  He also has hypertension, hyperlipidemia, DM.    He was admitted in October 2020 at [facility] for bowel obstruction treated with surgical lysis of adhesions.  1 week after discharge he fell in his garage and required shoulder surgery in December 2020.  He is aggressively treated for indigestion with Tagamet and Protonix without improvement.  He underwent EGD that showed mild inflammation.  He has symptoms after eating a meal but also occur with activity.  Due to some chest discomfort with radiation to his left arm, he underwent right and left heart catheterization.  Right heart pressures were normal and his angiogram was stable.  There was an attempt to open the chronically occluded left circumflex but this was unsuccessful due to inability to cross the lesion with a wire.  In retrospect the vessel was really unchanged dating back before his CABG.  He was seen in the ER 01/09/2020 for chest pain but ruled out with negative troponins.  Chest pain resolved after GI cocktail.  In April 2022 he was hospitalized at [facility] for internal bleeding.  He underwent ABIs and arterial duplex found to have PAD and referred to Dr. Allyson Sabal. He had abnormal dopplers indicating occluded SFAs.  He presented in October 2022 with worsening chest pain. HS troponin trended from 79 --> 878. EKG appeared  unchanged from prior tracings. Repeat heart catheterization was performed and showed continued patency of 3/3 grafts and severely disease, calcified and aneurysmal LCX. Flow appeared worse compared to prior study in the LCX, however, no targets for PCI. Continued medical therapy was recommended. He did not tolerate higher doses of ranexa. Amlodipine increased from 5 mg to 7.5 mg. Intolerant of nitrates due to headache, vomiting, and hand swelling. Resumed 500 mg ranexa BID.   Was seen in October by APP. He was  dealing with a wound on the bottom and side of his right foot.  He does being seen regularly at wound care at [facility]. Wound care is through [facility] and they referred him to a vascular surgeon.  Did see Dr Cherly Hensen who stated fem to distal bypass was an option if wound did not heal but surgery would be higher risk based on age and co morbidities. Angiogram demonstrated long segment fem-pop disease with anterior tibial artery reconstitution. Not a candidate for percutaneous intervention.   He was seen on April 4 by Bernadene Person NP. He was hospitalized in March 2023 with generalized weakness, records not available for review.  Echocardiogram at [facility] in 08/2022 showed EF 50 to 55%, basal inferior wall hypokinesis, impaired diastolic dysfunction, normal RV systolic function, mild aortic stenosis.  He was discharged to [facility] nursing home.  Since his discharge he continues to note generalized weakness. Noted increased dyspnea on exertion.  It  seems that this may be similar to his prior anginal equivalent.  His amlodipine dose was increased.   Since he was seen in April he contracted Covid 19 about 3 weeks ago. Also developed a UTI one week ago. Still has a nonhealing ulcer on his left foot that is painful. He has lost 25 lbs since Feb. Appetite is poor. Now on SGLT 2 inhibitor and off glipizide. No edema. Energy level is very poor. Just starting to work with PT. He has chronic  SOB.    Past Medical History:  Diagnosis Date   Cancer [facility])    Chronic kidney disease    kidney stones   Coronary artery disease    with CABG in 2006 with LIMA to LAD, SVG to mid and distal OM, SVG to AM. Normal Myoview in May of 2013   Diabetes mellitus    Type 2   GERD (gastroesophageal reflux disease)    Hematuria    Hypercholesterolemia    Hypertension    Irritable bowel syndrome    Loose stools    Prostate cancer (HCC)    Urinary obstruction     Past Surgical History:  Procedure Laterality Date   CHOLECYSTECTOMY     CORONARY ARTERY BYPASS GRAFT  2006   LEFT HEART CATH AND CORS/GRAFTS ANGIOGRAPHY N/A 04/09/2021   Procedure: LEFT HEART CATH AND CORS/GRAFTS ANGIOGRAPHY;  Surgeon: [place], Dillinger Aston M, MD;  Location: [facility] INVASIVE CV LAB;  Service: Cardiovascular;  Laterality: N/A;   LEFT HEART CATHETERIZATION WITH CORONARY/GRAFT ANGIOGRAM N/A 04/11/2014   Procedure: LEFT HEART CATHETERIZATION WITH Isabel Caprice;  Surgeon: Diante Barley M [place], MD;  Location: [facility] CATH LAB;  Service: Cardiovascular;  Laterality: N/A;   OTHER SURGICAL HISTORY     bowel obstruction   RADIOACTIVE SEED IMPLANT     for prostate cancer   RIGHT/LEFT HEART CATH AND CORONARY/GRAFT ANGIOGRAPHY N/A 12/12/2019   Procedure: RIGHT/LEFT HEART CATH AND CORONARY/GRAFT ANGIOGRAPHY;  Surgeon: [place], Maliah Pyles M, MD;  Location: [facility] INVASIVE CV LAB;  Service: Cardiovascular;  Laterality: N/A;   SKIN CANCER EXCISION     TEMPORARY PACEMAKER N/A 12/13/2019   Procedure: TEMPORARY PACEMAKER;  Surgeon: [place], Ronell Boldin M, MD;  Location: [facility] INVASIVE CV LAB;  Service: Cardiovascular;  Laterality: N/A;    Current Medications: Current Meds  Medication Sig   Accu-Chek Softclix Lancets lancets    acetaminophen (TYLENOL) 500 MG tablet Take 1,000 mg by mouth every 6 (six) hours as needed for moderate pain or mild pain.   albuterol (VENTOLIN HFA) 108 (90 Base) MCG/ACT inhaler Inhale 2 puffs into the lungs every 6 (six) hours as  needed for wheezing or shortness of breath.   aspirin 81 MG tablet Take 81 mg by mouth daily.   atorvastatin (LIPITOR) 80 MG tablet Take 1 tablet (80 mg total) by mouth daily.   BREZTRI AEROSPHERE 160-9-4.8 MCG/ACT AERO Inhale 2 puffs into the lungs 2 (two) times daily.   cephALEXin (KEFLEX) 500 MG capsule Take 500 mg by mouth 4 (four) times daily.   dapagliflozin propanediol (FARXIGA) 10 MG TABS tablet Take 10 mg by mouth daily.   famotidine (PEPCID) 40 MG tablet Take 40 mg by mouth daily.   ferrous sulfate 324 (65 Fe) MG TBEC Take 1 tablet by mouth daily.   finasteride (PROSCAR) 5 MG tablet Take 5 mg by mouth daily.   furosemide (LASIX) 40 MG tablet Take 20 mg by mouth daily.   gabapentin (NEURONTIN) 100 MG capsule Take 100 mg by mouth 2 (two) times  daily.   glucose blood (ACCU-CHEK AVIVA PLUS) test strip    levothyroxine (SYNTHROID) 100 MCG tablet Take 1 tablet (100 mcg total) by mouth daily before breakfast.   magnesium hydroxide (MILK OF MAGNESIA) 800 MG/5ML suspension Take 30 mLs by mouth daily as needed for constipation.   metFORMIN (GLUCOPHAGE) 500 MG tablet Take 1 tablet (500 mg total) by mouth 2 (two) times daily with a meal. Resume on 04/13/21.   metoprolol tartrate (LOPRESSOR) 25 MG tablet Take 25 mg by mouth 2 (two) times daily.   Multiple Vitamin (MULTIVITAMIN) tablet Take 1 tablet by mouth daily.   nitroGLYCERIN (NITROSTAT) 0.4 MG SL tablet Place 0.4 mg under the tongue every 5 (five) minutes as needed for chest pain.   ondansetron (ZOFRAN) 4 MG tablet Take 1 tablet (4 mg total) by mouth every 8 (eight) hours as needed for nausea or vomiting.   pantoprazole (PROTONIX) 40 MG tablet Take 1 tablet (40 mg total) by mouth daily.   ranolazine (RANEXA) 500 MG 12 hr tablet TAKE 1 TABLET TWICE DAILY   sacubitril-valsartan (ENTRESTO) 24-26 MG Take 1 tablet by mouth 2 (two) times daily.   senna (SENOKOT) 8.6 MG tablet Take 1 tablet by mouth daily.   Vibegron 75 MG TABS Take 1 tablet by  mouth daily.   vitamin B-12 (CYANOCOBALAMIN) 1000 MCG tablet Take 1 tablet (1,000 mcg total) by mouth daily.   [DISCONTINUED] amLODipine (NORVASC) 5 MG tablet Take 2 tablets (10 mg ) once daily as directed.     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  Tobacco Use   Smoking status: Former    Packs/day: 3    Types: Cigarettes   Smokeless tobacco: Never  Vaping Use   Vaping Use: Never used  Substance and Sexual Activity   Alcohol use: No   Drug use: No   Sexual activity: Not Currently  Other Topics Concern   Not on file  Social History Narrative   Not on file   Social Determinants of Health   Financial Resource Strain: Low Risk  (05/05/2022)   Overall Financial Resource Strain (CARDIA)    Difficulty of Paying Living Expenses: Not hard at all  Food Insecurity: No Food Insecurity (05/05/2022)   Hunger Vital Sign    Worried About Running Out of Food in the Last Year: Never true    Ran Out of Food in the Last Year: Never true  Transportation Needs: No Transportation Needs (05/05/2022)   PRAPARE - Administrator, Civil Service (Medical): No    Lack of Transportation (Non-Medical): No  Physical Activity: Inactive (05/05/2022)   Exercise Vital Sign    Days of Exercise per Week: 0 days    Minutes of Exercise per Session: 0 min  Stress: No Stress Concern Present (05/05/2022)   [employer] of Occupational Health - Occupational Stress Questionnaire    Feeling of Stress : Not at all  Social Connections: Unknown (05/05/2022)   Social Connection and Isolation Panel [NHANES]    Frequency of Communication with Friends and Family: More than  three times a week    Frequency of Social  Gatherings with Friends and Family: More than three times a week    Attends Religious Services: Not on file    Active Member of Clubs or Organizations: Not on file    Attends Banker Meetings: Not on file    Marital Status: Not on file     Family History: The patient's family history includes Cancer in his father and mother; Diabetes in his father; Heart attack in his father; Hypertension in his father and mother.  ROS:   Please see the history of present illness.     All other systems reviewed and are negative.  EKGs/Labs/Other Studies Reviewed:    The following studies were reviewed today:  Heart monitor 01/15/20: Normal sinus rhythm Occasional PACs. Occasional brief runs of PAT longest lasting 18.5 seconds.   Staged PCI 12/13/19: Dist LAD-1 lesion is 60% stenosed. Prox Cx lesion is 99% stenosed. Prox Cx to Mid Cx lesion is 90% stenosed. 2nd Mrg lesion is 75% stenosed. Unsuccessful PCI due to inability to cross with a wire.   1. Unsuccessful PCI of the LCx due to inability to cross the lesion with a wire.    Plan: medical therapy. ASA only. Will increase amlodipine to 5 mg daily. Consider Ranexa if symptoms persists. Anticipate DC tomorrow am.   Heart cath 12/12/19: Mid LM lesion is 35% stenosed. Ost LAD to Mid LAD lesion is 100% stenosed. Prox Cx lesion is 99% stenosed. Prox Cx to Mid Cx lesion is 90% stenosed. 1st Mrg lesion is 100% stenosed. 2nd Mrg lesion is 75% stenosed. Prox RCA to Dist RCA lesion is 100% stenosed. LIMA graft was visualized by angiography and is normal in caliber. The graft exhibits no disease. Dist LAD-1 lesion is 60% stenosed. Dist LAD-2 lesion is 90% stenosed. SVG graft was visualized by angiography and is large. The graft exhibits minimal luminal irregularities. SVG graft was visualized by angiography and is normal in caliber. The graft exhibits mild . The left ventricular systolic  function is normal. LV end diastolic pressure is normal. The left ventricular ejection fraction is 55-65% by visual estimate.   1. Complex 3 vessel obstructive CAD.  2. Patent LIMA to the LAD. There is severe disease in the distal LAD. The vessel is small in caliber and not suitable for PCI 3. Patent SVG to OM1 4. Patent SVG to RV marginal branch. The RCA is occluded with left to right collaterals from the LCx to the distal RCA 5. Normal LV function 6. Normal LV filling pressures 7. Normal right heart pressures   Plan: The native LCX is severely diseased proximally and in the mid vessel with aneurysmal dilation and heavy calcification. This supplies the second and third OM branches and collaterals to the distal RCA. This is a potential target for intervention. It will require atherectomy. Given progressive symptoms will admit to telemetry. Start IV heparin. Load with Plavix. Planned staged complex PCI of the LCx tomorrow.   Cardiac cath 04/09/21:  LEFT HEART CATH AND CORS/GRAFTS ANGIOGRAPHY   Conclusion      Mid LM lesion is 35% stenosed.   Ost LAD to Mid LAD lesion is 100% stenosed.   Dist LAD-1 lesion is 60% stenosed.   Dist LAD-2 lesion is 90% stenosed.   Prox RCA to Dist RCA lesion is 100% stenosed.   1st Mrg lesion is 100% stenosed.   2nd Mrg lesion is 75% stenosed.   Prox Cx lesion is 99% stenosed.   Prox Cx to Mid Cx lesion is 95%  stenosed.   LIMA and is normal in caliber.   and is normal in caliber.   and is large.   The graft exhibits no disease.   The graft exhibits minimal luminal irregularities.   The graft exhibits mild .   The left ventricular systolic function is normal.   LV end diastolic pressure is normal.   The left ventricular ejection fraction is 55-65% by visual estimate.   Severe 3 vessel obstructive CAD Continued patency of bypass grafts including LIMA to LAD, SVG to OM1, SVG to RV marginal vessel Normal LV function Normal LVEDP   Plan: I suspect  his culprit is the native LCx. This vessel is severely diseased, calcified and aneurysmal. Flow appears worse compared to prior study. We have attempted PCI of this in the past but unable to cross with a wire. There are no targets for PCI. Will continue medical therapy. Intolerant of higher Ranexa dose. Will increase amlodipine.    Echo 04/10/21:  IMPRESSIONS     1. Left ventricular ejection fraction, by estimation, is 60 to 65%. The  left ventricle has normal function. The left ventricle has no regional  wall motion abnormalities. Left ventricular diastolic parameters are  consistent with Grade I diastolic  dysfunction (impaired relaxation).   2. Right ventricular systolic function is normal. The right ventricular  size is normal.   3. The mitral valve is abnormal. Trivial mitral valve regurgitation. No  evidence of mitral stenosis.   4. The aortic valve was not well visualized. Aortic valve regurgitation  is not visualized. Mild to moderate aortic valve sclerosis/calcification  is present, without any evidence of aortic stenosis.   5. The inferior vena cava is normal in size with greater than 50%  respiratory variability, suggesting right atrial pressure of 3 mmHg.   EKG:  EKG is not ordered today.  Recent Labs: 09/29/2022: ALT 31; BNP 145.3; BUN 21; Creatinine, Ser 0.86; Hemoglobin 11.5; Platelets 386; Potassium 5.0; Sodium 139  Recent Lipid Panel    Component Value Date/Time   CHOL 95 04/10/2021 0231   TRIG 102 04/10/2021 0231   HDL 27 (L) 04/10/2021 0231   CHOLHDL 3.5 04/10/2021 0231   VLDL 20 04/10/2021 0231   LDLCALC 48 04/10/2021 0231   Dated 04/16/21: A1c 6.8%.  Dated 05/03/22: A1c 7.9%. cholesterol 131, triglycerides 166, HDL 38, LDL 65. CMET normal  Physical Exam:    VS:  BP 106/64 (BP Location: Left Arm, Patient Position: Sitting, Cuff Size: Normal)   Pulse 69   Ht 5\' 10"  (1.778 m)   Wt 162 lb (73.5 kg)   SpO2 91%   BMI 23.24 kg/m     Wt Readings from Last 3  Encounters:  11/01/22 162 lb (73.5 kg)  10/24/22 167 lb 15.9 oz (76.2 kg)  09/29/22 168 lb (76.2 kg)     GEN:  Well nourished, elderly, appears ill.  HEENT: Normal NECK: No JVD; No carotid bruits LYMPHATICS: No lymphadenopathy CARDIAC: RRR, soft systolic murmur RESPIRATORY:  bibasilar dry crackles.  ABDOMEN: Soft, non-tender, non-distended MUSCULOSKELETAL:  No edema; No deformity  SKIN: Warm and dry NEUROLOGIC:  Alert and oriented x 3 PSYCHIATRIC:  Normal affect   ASSESSMENT:    1. Essential hypertension   2. Hyperlipidemia LDL goal <70   3. Peripheral arterial disease (HCC)   4. Coronary artery disease involving coronary bypass graft of native heart with angina pectoris (HCC)       PLAN:    CAD status post CABG 2006 (LIMA-LAD, SVG-RV  marginal branch, SVG-OM1) -  heart catheterization in October 2022 with patent grafts - Chronically occluded LCX - previously unable to cross with wire.  - continue medical management with ranexa and amlodipine, ASA, statin - not on BB - intolerant of nitrates.  - no significant angina today. I am concerned he is overmedicated with his failing health. Will reduce amlodipine to 5 mg daily.  - will also reduce lasix to 20 mg daily. Patient appears dry.   2. Hyperlipidemia with LDL goal < 70 - Continue 80 mg lipitor  3. DM2 - Per PCP - on metformin and [place].  - if recurrent UTI will need to hold [place].   4. Hypertension - reduce amlodipine today  5. Paroxysmal atrial tachycardia - Noted on telemetry in hospital and heart monitor 2021 - not on a BB - no palpitations   6. PAD- bilateral SFA occlusion. - chronic ulcer left heel.     Follow up in 3 months will APP  Medication Adjustments/Labs and Tests Ordered: Current medicines are reviewed at length with the patient today.  Concerns regarding medicines are outlined above.  No orders of the defined types were placed in this encounter.    Meds ordered this encounter   Medications   amLODipine (NORVASC) 5 MG tablet    Sig: Take 1 tablet (5 mg total) by mouth daily.    Dispense:  180 tablet    Refill:  3      Signed, Katoya Amato [place], MD  11/01/2022 1:37 PM    [facility]

## 2022-10-30 DIAGNOSIS — R2689 Other abnormalities of gait and mobility: Secondary | ICD-10-CM | POA: Diagnosis not present

## 2022-10-30 DIAGNOSIS — M86171 Other acute osteomyelitis, right ankle and foot: Secondary | ICD-10-CM | POA: Diagnosis not present

## 2022-10-30 DIAGNOSIS — I739 Peripheral vascular disease, unspecified: Secondary | ICD-10-CM | POA: Diagnosis not present

## 2022-10-30 DIAGNOSIS — I214 Non-ST elevation (NSTEMI) myocardial infarction: Secondary | ICD-10-CM | POA: Diagnosis not present

## 2022-10-30 DIAGNOSIS — R2681 Unsteadiness on feet: Secondary | ICD-10-CM | POA: Diagnosis not present

## 2022-10-30 DIAGNOSIS — M6281 Muscle weakness (generalized): Secondary | ICD-10-CM | POA: Diagnosis not present

## 2022-10-30 DIAGNOSIS — E119 Type 2 diabetes mellitus without complications: Secondary | ICD-10-CM | POA: Diagnosis not present

## 2022-10-30 DIAGNOSIS — I5032 Chronic diastolic (congestive) heart failure: Secondary | ICD-10-CM | POA: Diagnosis not present

## 2022-10-30 DIAGNOSIS — R54 Age-related physical debility: Secondary | ICD-10-CM | POA: Diagnosis not present

## 2022-10-31 DIAGNOSIS — E119 Type 2 diabetes mellitus without complications: Secondary | ICD-10-CM | POA: Diagnosis not present

## 2022-10-31 DIAGNOSIS — R2681 Unsteadiness on feet: Secondary | ICD-10-CM | POA: Diagnosis not present

## 2022-10-31 DIAGNOSIS — R54 Age-related physical debility: Secondary | ICD-10-CM | POA: Diagnosis not present

## 2022-10-31 DIAGNOSIS — M6281 Muscle weakness (generalized): Secondary | ICD-10-CM | POA: Diagnosis not present

## 2022-10-31 DIAGNOSIS — I214 Non-ST elevation (NSTEMI) myocardial infarction: Secondary | ICD-10-CM | POA: Diagnosis not present

## 2022-10-31 DIAGNOSIS — I739 Peripheral vascular disease, unspecified: Secondary | ICD-10-CM | POA: Diagnosis not present

## 2022-10-31 DIAGNOSIS — M86171 Other acute osteomyelitis, right ankle and foot: Secondary | ICD-10-CM | POA: Diagnosis not present

## 2022-10-31 DIAGNOSIS — R2689 Other abnormalities of gait and mobility: Secondary | ICD-10-CM | POA: Diagnosis not present

## 2022-10-31 DIAGNOSIS — I5032 Chronic diastolic (congestive) heart failure: Secondary | ICD-10-CM | POA: Diagnosis not present

## 2022-11-01 ENCOUNTER — Ambulatory Visit: Payer: Medicare PPO | Attending: Cardiology | Admitting: Cardiology

## 2022-11-01 ENCOUNTER — Encounter: Payer: Self-pay | Admitting: Cardiology

## 2022-11-01 VITALS — BP 106/64 | HR 69 | Ht 70.0 in | Wt 162.0 lb

## 2022-11-01 DIAGNOSIS — I214 Non-ST elevation (NSTEMI) myocardial infarction: Secondary | ICD-10-CM | POA: Diagnosis not present

## 2022-11-01 DIAGNOSIS — I10 Essential (primary) hypertension: Secondary | ICD-10-CM

## 2022-11-01 DIAGNOSIS — I5032 Chronic diastolic (congestive) heart failure: Secondary | ICD-10-CM | POA: Diagnosis not present

## 2022-11-01 DIAGNOSIS — I25709 Atherosclerosis of coronary artery bypass graft(s), unspecified, with unspecified angina pectoris: Secondary | ICD-10-CM

## 2022-11-01 DIAGNOSIS — R54 Age-related physical debility: Secondary | ICD-10-CM | POA: Diagnosis not present

## 2022-11-01 DIAGNOSIS — R2689 Other abnormalities of gait and mobility: Secondary | ICD-10-CM | POA: Diagnosis not present

## 2022-11-01 DIAGNOSIS — M86171 Other acute osteomyelitis, right ankle and foot: Secondary | ICD-10-CM | POA: Diagnosis not present

## 2022-11-01 DIAGNOSIS — E785 Hyperlipidemia, unspecified: Secondary | ICD-10-CM

## 2022-11-01 DIAGNOSIS — I739 Peripheral vascular disease, unspecified: Secondary | ICD-10-CM

## 2022-11-01 DIAGNOSIS — R2681 Unsteadiness on feet: Secondary | ICD-10-CM | POA: Diagnosis not present

## 2022-11-01 DIAGNOSIS — M6281 Muscle weakness (generalized): Secondary | ICD-10-CM | POA: Diagnosis not present

## 2022-11-01 DIAGNOSIS — E119 Type 2 diabetes mellitus without complications: Secondary | ICD-10-CM | POA: Diagnosis not present

## 2022-11-01 MED ORDER — AMLODIPINE BESYLATE 5 MG PO TABS
5.0000 mg | ORAL_TABLET | Freq: Every day | ORAL | 3 refills | Status: DC
Start: 2022-11-01 — End: 2023-02-11

## 2022-11-01 NOTE — Patient Instructions (Signed)
Medication Instructions:  Decrease Furosemide to 20 mg daily Decrease Amlodipine to 5 mg daily  Continue all other medications *If you need a refill on your cardiac medications before your next appointment, please call your pharmacy*   Lab Work: None ordered   Testing/Procedures: None ordered   Follow-Up: At [facility], you and your health needs are our priority.  As part of our continuing mission to provide you with exceptional heart care, we have created designated Provider Care Teams.  These Care Teams include your primary Cardiologist (physician) and Advanced Practice Providers (APPs -  Physician Assistants and Nurse Practitioners) who all work together to provide you with the care you need, when you need it.  We recommend signing up for the patient portal called "MyChart".  Sign up information is provided on this After Visit Summary.  MyChart is used to connect with patients for Virtual Visits (Telemedicine).  Patients are able to view lab/test results, encounter notes, upcoming appointments, etc.  Non-urgent messages can be sent to your provider as well.   To learn more about what you can do with MyChart, go to [email or web address].    Your next appointment:      Provider:  Bernadene Person NP

## 2022-11-02 DIAGNOSIS — E119 Type 2 diabetes mellitus without complications: Secondary | ICD-10-CM | POA: Diagnosis not present

## 2022-11-02 DIAGNOSIS — R2681 Unsteadiness on feet: Secondary | ICD-10-CM | POA: Diagnosis not present

## 2022-11-02 DIAGNOSIS — I5032 Chronic diastolic (congestive) heart failure: Secondary | ICD-10-CM | POA: Diagnosis not present

## 2022-11-02 DIAGNOSIS — R54 Age-related physical debility: Secondary | ICD-10-CM | POA: Diagnosis not present

## 2022-11-02 DIAGNOSIS — M6281 Muscle weakness (generalized): Secondary | ICD-10-CM | POA: Diagnosis not present

## 2022-11-02 DIAGNOSIS — I214 Non-ST elevation (NSTEMI) myocardial infarction: Secondary | ICD-10-CM | POA: Diagnosis not present

## 2022-11-02 DIAGNOSIS — M86171 Other acute osteomyelitis, right ankle and foot: Secondary | ICD-10-CM | POA: Diagnosis not present

## 2022-11-02 DIAGNOSIS — R2689 Other abnormalities of gait and mobility: Secondary | ICD-10-CM | POA: Diagnosis not present

## 2022-11-02 DIAGNOSIS — I739 Peripheral vascular disease, unspecified: Secondary | ICD-10-CM | POA: Diagnosis not present

## 2022-11-03 DIAGNOSIS — R2689 Other abnormalities of gait and mobility: Secondary | ICD-10-CM | POA: Diagnosis not present

## 2022-11-03 DIAGNOSIS — R2681 Unsteadiness on feet: Secondary | ICD-10-CM | POA: Diagnosis not present

## 2022-11-03 DIAGNOSIS — I5032 Chronic diastolic (congestive) heart failure: Secondary | ICD-10-CM | POA: Diagnosis not present

## 2022-11-03 DIAGNOSIS — M86171 Other acute osteomyelitis, right ankle and foot: Secondary | ICD-10-CM | POA: Diagnosis not present

## 2022-11-03 DIAGNOSIS — R54 Age-related physical debility: Secondary | ICD-10-CM | POA: Diagnosis not present

## 2022-11-03 DIAGNOSIS — E119 Type 2 diabetes mellitus without complications: Secondary | ICD-10-CM | POA: Diagnosis not present

## 2022-11-03 DIAGNOSIS — M6281 Muscle weakness (generalized): Secondary | ICD-10-CM | POA: Diagnosis not present

## 2022-11-03 DIAGNOSIS — I214 Non-ST elevation (NSTEMI) myocardial infarction: Secondary | ICD-10-CM | POA: Diagnosis not present

## 2022-11-03 DIAGNOSIS — I739 Peripheral vascular disease, unspecified: Secondary | ICD-10-CM | POA: Diagnosis not present

## 2022-11-04 DIAGNOSIS — R54 Age-related physical debility: Secondary | ICD-10-CM | POA: Diagnosis not present

## 2022-11-04 DIAGNOSIS — E119 Type 2 diabetes mellitus without complications: Secondary | ICD-10-CM | POA: Diagnosis not present

## 2022-11-04 DIAGNOSIS — M86171 Other acute osteomyelitis, right ankle and foot: Secondary | ICD-10-CM | POA: Diagnosis not present

## 2022-11-04 DIAGNOSIS — I5032 Chronic diastolic (congestive) heart failure: Secondary | ICD-10-CM | POA: Diagnosis not present

## 2022-11-04 DIAGNOSIS — I214 Non-ST elevation (NSTEMI) myocardial infarction: Secondary | ICD-10-CM | POA: Diagnosis not present

## 2022-11-04 DIAGNOSIS — I739 Peripheral vascular disease, unspecified: Secondary | ICD-10-CM | POA: Diagnosis not present

## 2022-11-04 DIAGNOSIS — M6281 Muscle weakness (generalized): Secondary | ICD-10-CM | POA: Diagnosis not present

## 2022-11-04 DIAGNOSIS — R2681 Unsteadiness on feet: Secondary | ICD-10-CM | POA: Diagnosis not present

## 2022-11-04 DIAGNOSIS — R2689 Other abnormalities of gait and mobility: Secondary | ICD-10-CM | POA: Diagnosis not present

## 2022-11-05 DIAGNOSIS — M86171 Other acute osteomyelitis, right ankle and foot: Secondary | ICD-10-CM | POA: Diagnosis not present

## 2022-11-05 DIAGNOSIS — E119 Type 2 diabetes mellitus without complications: Secondary | ICD-10-CM | POA: Diagnosis not present

## 2022-11-05 DIAGNOSIS — M6281 Muscle weakness (generalized): Secondary | ICD-10-CM | POA: Diagnosis not present

## 2022-11-05 DIAGNOSIS — R2681 Unsteadiness on feet: Secondary | ICD-10-CM | POA: Diagnosis not present

## 2022-11-05 DIAGNOSIS — I739 Peripheral vascular disease, unspecified: Secondary | ICD-10-CM | POA: Diagnosis not present

## 2022-11-05 DIAGNOSIS — R54 Age-related physical debility: Secondary | ICD-10-CM | POA: Diagnosis not present

## 2022-11-05 DIAGNOSIS — I214 Non-ST elevation (NSTEMI) myocardial infarction: Secondary | ICD-10-CM | POA: Diagnosis not present

## 2022-11-05 DIAGNOSIS — R2689 Other abnormalities of gait and mobility: Secondary | ICD-10-CM | POA: Diagnosis not present

## 2022-11-05 DIAGNOSIS — I5032 Chronic diastolic (congestive) heart failure: Secondary | ICD-10-CM | POA: Diagnosis not present

## 2022-11-07 DIAGNOSIS — R2681 Unsteadiness on feet: Secondary | ICD-10-CM | POA: Diagnosis not present

## 2022-11-07 DIAGNOSIS — M86171 Other acute osteomyelitis, right ankle and foot: Secondary | ICD-10-CM | POA: Diagnosis not present

## 2022-11-07 DIAGNOSIS — I214 Non-ST elevation (NSTEMI) myocardial infarction: Secondary | ICD-10-CM | POA: Diagnosis not present

## 2022-11-07 DIAGNOSIS — M6281 Muscle weakness (generalized): Secondary | ICD-10-CM | POA: Diagnosis not present

## 2022-11-07 DIAGNOSIS — R54 Age-related physical debility: Secondary | ICD-10-CM | POA: Diagnosis not present

## 2022-11-07 DIAGNOSIS — I739 Peripheral vascular disease, unspecified: Secondary | ICD-10-CM | POA: Diagnosis not present

## 2022-11-07 DIAGNOSIS — E119 Type 2 diabetes mellitus without complications: Secondary | ICD-10-CM | POA: Diagnosis not present

## 2022-11-07 DIAGNOSIS — I5032 Chronic diastolic (congestive) heart failure: Secondary | ICD-10-CM | POA: Diagnosis not present

## 2022-11-07 DIAGNOSIS — R2689 Other abnormalities of gait and mobility: Secondary | ICD-10-CM | POA: Diagnosis not present

## 2022-11-08 DIAGNOSIS — R2681 Unsteadiness on feet: Secondary | ICD-10-CM | POA: Diagnosis not present

## 2022-11-08 DIAGNOSIS — M86171 Other acute osteomyelitis, right ankle and foot: Secondary | ICD-10-CM | POA: Diagnosis not present

## 2022-11-08 DIAGNOSIS — M6281 Muscle weakness (generalized): Secondary | ICD-10-CM | POA: Diagnosis not present

## 2022-11-08 DIAGNOSIS — I5032 Chronic diastolic (congestive) heart failure: Secondary | ICD-10-CM | POA: Diagnosis not present

## 2022-11-08 DIAGNOSIS — I214 Non-ST elevation (NSTEMI) myocardial infarction: Secondary | ICD-10-CM | POA: Diagnosis not present

## 2022-11-08 DIAGNOSIS — R2689 Other abnormalities of gait and mobility: Secondary | ICD-10-CM | POA: Diagnosis not present

## 2022-11-08 DIAGNOSIS — I739 Peripheral vascular disease, unspecified: Secondary | ICD-10-CM | POA: Diagnosis not present

## 2022-11-08 DIAGNOSIS — R54 Age-related physical debility: Secondary | ICD-10-CM | POA: Diagnosis not present

## 2022-11-08 DIAGNOSIS — E119 Type 2 diabetes mellitus without complications: Secondary | ICD-10-CM | POA: Diagnosis not present

## 2022-11-09 DIAGNOSIS — R54 Age-related physical debility: Secondary | ICD-10-CM | POA: Diagnosis not present

## 2022-11-09 DIAGNOSIS — I214 Non-ST elevation (NSTEMI) myocardial infarction: Secondary | ICD-10-CM | POA: Diagnosis not present

## 2022-11-09 DIAGNOSIS — I5032 Chronic diastolic (congestive) heart failure: Secondary | ICD-10-CM | POA: Diagnosis not present

## 2022-11-09 DIAGNOSIS — L89620 Pressure ulcer of left heel, unstageable: Secondary | ICD-10-CM | POA: Diagnosis not present

## 2022-11-09 DIAGNOSIS — I739 Peripheral vascular disease, unspecified: Secondary | ICD-10-CM | POA: Diagnosis not present

## 2022-11-09 DIAGNOSIS — L89616 Pressure-induced deep tissue damage of right heel: Secondary | ICD-10-CM | POA: Diagnosis not present

## 2022-11-09 DIAGNOSIS — I70245 Atherosclerosis of native arteries of left leg with ulceration of other part of foot: Secondary | ICD-10-CM | POA: Diagnosis not present

## 2022-11-09 DIAGNOSIS — I70235 Atherosclerosis of native arteries of right leg with ulceration of other part of foot: Secondary | ICD-10-CM | POA: Diagnosis not present

## 2022-11-09 DIAGNOSIS — R2689 Other abnormalities of gait and mobility: Secondary | ICD-10-CM | POA: Diagnosis not present

## 2022-11-09 DIAGNOSIS — M86171 Other acute osteomyelitis, right ankle and foot: Secondary | ICD-10-CM | POA: Diagnosis not present

## 2022-11-09 DIAGNOSIS — M6281 Muscle weakness (generalized): Secondary | ICD-10-CM | POA: Diagnosis not present

## 2022-11-09 DIAGNOSIS — R2681 Unsteadiness on feet: Secondary | ICD-10-CM | POA: Diagnosis not present

## 2022-11-09 DIAGNOSIS — L89302 Pressure ulcer of unspecified buttock, stage 2: Secondary | ICD-10-CM | POA: Diagnosis not present

## 2022-11-09 DIAGNOSIS — E119 Type 2 diabetes mellitus without complications: Secondary | ICD-10-CM | POA: Diagnosis not present

## 2022-11-10 DIAGNOSIS — R2689 Other abnormalities of gait and mobility: Secondary | ICD-10-CM | POA: Diagnosis not present

## 2022-11-10 DIAGNOSIS — M86171 Other acute osteomyelitis, right ankle and foot: Secondary | ICD-10-CM | POA: Diagnosis not present

## 2022-11-10 DIAGNOSIS — I214 Non-ST elevation (NSTEMI) myocardial infarction: Secondary | ICD-10-CM | POA: Diagnosis not present

## 2022-11-10 DIAGNOSIS — R54 Age-related physical debility: Secondary | ICD-10-CM | POA: Diagnosis not present

## 2022-11-10 DIAGNOSIS — I5032 Chronic diastolic (congestive) heart failure: Secondary | ICD-10-CM | POA: Diagnosis not present

## 2022-11-10 DIAGNOSIS — I739 Peripheral vascular disease, unspecified: Secondary | ICD-10-CM | POA: Diagnosis not present

## 2022-11-10 DIAGNOSIS — E119 Type 2 diabetes mellitus without complications: Secondary | ICD-10-CM | POA: Diagnosis not present

## 2022-11-10 DIAGNOSIS — R2681 Unsteadiness on feet: Secondary | ICD-10-CM | POA: Diagnosis not present

## 2022-11-10 DIAGNOSIS — M6281 Muscle weakness (generalized): Secondary | ICD-10-CM | POA: Diagnosis not present

## 2022-11-11 DIAGNOSIS — I5032 Chronic diastolic (congestive) heart failure: Secondary | ICD-10-CM | POA: Diagnosis not present

## 2022-11-11 DIAGNOSIS — R54 Age-related physical debility: Secondary | ICD-10-CM | POA: Diagnosis not present

## 2022-11-11 DIAGNOSIS — R2681 Unsteadiness on feet: Secondary | ICD-10-CM | POA: Diagnosis not present

## 2022-11-11 DIAGNOSIS — M6281 Muscle weakness (generalized): Secondary | ICD-10-CM | POA: Diagnosis not present

## 2022-11-11 DIAGNOSIS — I739 Peripheral vascular disease, unspecified: Secondary | ICD-10-CM | POA: Diagnosis not present

## 2022-11-11 DIAGNOSIS — E119 Type 2 diabetes mellitus without complications: Secondary | ICD-10-CM | POA: Diagnosis not present

## 2022-11-11 DIAGNOSIS — M86171 Other acute osteomyelitis, right ankle and foot: Secondary | ICD-10-CM | POA: Diagnosis not present

## 2022-11-11 DIAGNOSIS — I214 Non-ST elevation (NSTEMI) myocardial infarction: Secondary | ICD-10-CM | POA: Diagnosis not present

## 2022-11-11 DIAGNOSIS — R2689 Other abnormalities of gait and mobility: Secondary | ICD-10-CM | POA: Diagnosis not present

## 2022-11-12 DIAGNOSIS — M86171 Other acute osteomyelitis, right ankle and foot: Secondary | ICD-10-CM | POA: Diagnosis not present

## 2022-11-12 DIAGNOSIS — I214 Non-ST elevation (NSTEMI) myocardial infarction: Secondary | ICD-10-CM | POA: Diagnosis not present

## 2022-11-12 DIAGNOSIS — R2681 Unsteadiness on feet: Secondary | ICD-10-CM | POA: Diagnosis not present

## 2022-11-12 DIAGNOSIS — I739 Peripheral vascular disease, unspecified: Secondary | ICD-10-CM | POA: Diagnosis not present

## 2022-11-12 DIAGNOSIS — I5032 Chronic diastolic (congestive) heart failure: Secondary | ICD-10-CM | POA: Diagnosis not present

## 2022-11-12 DIAGNOSIS — R2689 Other abnormalities of gait and mobility: Secondary | ICD-10-CM | POA: Diagnosis not present

## 2022-11-12 DIAGNOSIS — M6281 Muscle weakness (generalized): Secondary | ICD-10-CM | POA: Diagnosis not present

## 2022-11-12 DIAGNOSIS — R54 Age-related physical debility: Secondary | ICD-10-CM | POA: Diagnosis not present

## 2022-11-12 DIAGNOSIS — E119 Type 2 diabetes mellitus without complications: Secondary | ICD-10-CM | POA: Diagnosis not present

## 2022-11-13 DIAGNOSIS — R2689 Other abnormalities of gait and mobility: Secondary | ICD-10-CM | POA: Diagnosis not present

## 2022-11-13 DIAGNOSIS — M86171 Other acute osteomyelitis, right ankle and foot: Secondary | ICD-10-CM | POA: Diagnosis not present

## 2022-11-13 DIAGNOSIS — I5032 Chronic diastolic (congestive) heart failure: Secondary | ICD-10-CM | POA: Diagnosis not present

## 2022-11-13 DIAGNOSIS — I739 Peripheral vascular disease, unspecified: Secondary | ICD-10-CM | POA: Diagnosis not present

## 2022-11-13 DIAGNOSIS — E119 Type 2 diabetes mellitus without complications: Secondary | ICD-10-CM | POA: Diagnosis not present

## 2022-11-13 DIAGNOSIS — R2681 Unsteadiness on feet: Secondary | ICD-10-CM | POA: Diagnosis not present

## 2022-11-13 DIAGNOSIS — R54 Age-related physical debility: Secondary | ICD-10-CM | POA: Diagnosis not present

## 2022-11-13 DIAGNOSIS — I214 Non-ST elevation (NSTEMI) myocardial infarction: Secondary | ICD-10-CM | POA: Diagnosis not present

## 2022-11-13 DIAGNOSIS — M6281 Muscle weakness (generalized): Secondary | ICD-10-CM | POA: Diagnosis not present

## 2022-11-14 DIAGNOSIS — M6281 Muscle weakness (generalized): Secondary | ICD-10-CM | POA: Diagnosis not present

## 2022-11-14 DIAGNOSIS — I214 Non-ST elevation (NSTEMI) myocardial infarction: Secondary | ICD-10-CM | POA: Diagnosis not present

## 2022-11-14 DIAGNOSIS — R2689 Other abnormalities of gait and mobility: Secondary | ICD-10-CM | POA: Diagnosis not present

## 2022-11-14 DIAGNOSIS — E119 Type 2 diabetes mellitus without complications: Secondary | ICD-10-CM | POA: Diagnosis not present

## 2022-11-14 DIAGNOSIS — R54 Age-related physical debility: Secondary | ICD-10-CM | POA: Diagnosis not present

## 2022-11-14 DIAGNOSIS — I739 Peripheral vascular disease, unspecified: Secondary | ICD-10-CM | POA: Diagnosis not present

## 2022-11-14 DIAGNOSIS — R2681 Unsteadiness on feet: Secondary | ICD-10-CM | POA: Diagnosis not present

## 2022-11-14 DIAGNOSIS — M86171 Other acute osteomyelitis, right ankle and foot: Secondary | ICD-10-CM | POA: Diagnosis not present

## 2022-11-14 DIAGNOSIS — I5032 Chronic diastolic (congestive) heart failure: Secondary | ICD-10-CM | POA: Diagnosis not present

## 2022-11-15 DIAGNOSIS — M6281 Muscle weakness (generalized): Secondary | ICD-10-CM | POA: Diagnosis not present

## 2022-11-15 DIAGNOSIS — R2689 Other abnormalities of gait and mobility: Secondary | ICD-10-CM | POA: Diagnosis not present

## 2022-11-15 DIAGNOSIS — E119 Type 2 diabetes mellitus without complications: Secondary | ICD-10-CM | POA: Diagnosis not present

## 2022-11-15 DIAGNOSIS — I739 Peripheral vascular disease, unspecified: Secondary | ICD-10-CM | POA: Diagnosis not present

## 2022-11-15 DIAGNOSIS — I214 Non-ST elevation (NSTEMI) myocardial infarction: Secondary | ICD-10-CM | POA: Diagnosis not present

## 2022-11-15 DIAGNOSIS — I5032 Chronic diastolic (congestive) heart failure: Secondary | ICD-10-CM | POA: Diagnosis not present

## 2022-11-15 DIAGNOSIS — R2681 Unsteadiness on feet: Secondary | ICD-10-CM | POA: Diagnosis not present

## 2022-11-15 DIAGNOSIS — M86171 Other acute osteomyelitis, right ankle and foot: Secondary | ICD-10-CM | POA: Diagnosis not present

## 2022-11-15 DIAGNOSIS — R54 Age-related physical debility: Secondary | ICD-10-CM | POA: Diagnosis not present

## 2022-11-16 DIAGNOSIS — M6281 Muscle weakness (generalized): Secondary | ICD-10-CM | POA: Diagnosis not present

## 2022-11-16 DIAGNOSIS — I214 Non-ST elevation (NSTEMI) myocardial infarction: Secondary | ICD-10-CM | POA: Diagnosis not present

## 2022-11-16 DIAGNOSIS — R2681 Unsteadiness on feet: Secondary | ICD-10-CM | POA: Diagnosis not present

## 2022-11-16 DIAGNOSIS — L89616 Pressure-induced deep tissue damage of right heel: Secondary | ICD-10-CM | POA: Diagnosis not present

## 2022-11-16 DIAGNOSIS — I739 Peripheral vascular disease, unspecified: Secondary | ICD-10-CM | POA: Diagnosis not present

## 2022-11-16 DIAGNOSIS — L89302 Pressure ulcer of unspecified buttock, stage 2: Secondary | ICD-10-CM | POA: Diagnosis not present

## 2022-11-16 DIAGNOSIS — I70235 Atherosclerosis of native arteries of right leg with ulceration of other part of foot: Secondary | ICD-10-CM | POA: Diagnosis not present

## 2022-11-16 DIAGNOSIS — I70245 Atherosclerosis of native arteries of left leg with ulceration of other part of foot: Secondary | ICD-10-CM | POA: Diagnosis not present

## 2022-11-16 DIAGNOSIS — I5032 Chronic diastolic (congestive) heart failure: Secondary | ICD-10-CM | POA: Diagnosis not present

## 2022-11-16 DIAGNOSIS — R54 Age-related physical debility: Secondary | ICD-10-CM | POA: Diagnosis not present

## 2022-11-16 DIAGNOSIS — E119 Type 2 diabetes mellitus without complications: Secondary | ICD-10-CM | POA: Diagnosis not present

## 2022-11-16 DIAGNOSIS — L89620 Pressure ulcer of left heel, unstageable: Secondary | ICD-10-CM | POA: Diagnosis not present

## 2022-11-16 DIAGNOSIS — M86171 Other acute osteomyelitis, right ankle and foot: Secondary | ICD-10-CM | POA: Diagnosis not present

## 2022-11-16 DIAGNOSIS — R2689 Other abnormalities of gait and mobility: Secondary | ICD-10-CM | POA: Diagnosis not present

## 2022-11-17 DIAGNOSIS — I2510 Atherosclerotic heart disease of native coronary artery without angina pectoris: Secondary | ICD-10-CM | POA: Diagnosis not present

## 2022-11-17 DIAGNOSIS — R5381 Other malaise: Secondary | ICD-10-CM | POA: Diagnosis not present

## 2022-11-17 DIAGNOSIS — R2689 Other abnormalities of gait and mobility: Secondary | ICD-10-CM | POA: Diagnosis not present

## 2022-11-17 DIAGNOSIS — N390 Urinary tract infection, site not specified: Secondary | ICD-10-CM | POA: Diagnosis not present

## 2022-11-17 DIAGNOSIS — E119 Type 2 diabetes mellitus without complications: Secondary | ICD-10-CM | POA: Diagnosis not present

## 2022-11-17 DIAGNOSIS — I5032 Chronic diastolic (congestive) heart failure: Secondary | ICD-10-CM | POA: Diagnosis not present

## 2022-11-17 DIAGNOSIS — E1151 Type 2 diabetes mellitus with diabetic peripheral angiopathy without gangrene: Secondary | ICD-10-CM | POA: Diagnosis not present

## 2022-11-17 DIAGNOSIS — M6281 Muscle weakness (generalized): Secondary | ICD-10-CM | POA: Diagnosis not present

## 2022-11-17 DIAGNOSIS — M86171 Other acute osteomyelitis, right ankle and foot: Secondary | ICD-10-CM | POA: Diagnosis not present

## 2022-11-17 DIAGNOSIS — I739 Peripheral vascular disease, unspecified: Secondary | ICD-10-CM | POA: Diagnosis not present

## 2022-11-17 DIAGNOSIS — R54 Age-related physical debility: Secondary | ICD-10-CM | POA: Diagnosis not present

## 2022-11-17 DIAGNOSIS — I214 Non-ST elevation (NSTEMI) myocardial infarction: Secondary | ICD-10-CM | POA: Diagnosis not present

## 2022-11-17 DIAGNOSIS — R2681 Unsteadiness on feet: Secondary | ICD-10-CM | POA: Diagnosis not present

## 2022-11-19 DIAGNOSIS — I5032 Chronic diastolic (congestive) heart failure: Secondary | ICD-10-CM | POA: Diagnosis not present

## 2022-11-19 DIAGNOSIS — R2689 Other abnormalities of gait and mobility: Secondary | ICD-10-CM | POA: Diagnosis not present

## 2022-11-19 DIAGNOSIS — R54 Age-related physical debility: Secondary | ICD-10-CM | POA: Diagnosis not present

## 2022-11-19 DIAGNOSIS — I739 Peripheral vascular disease, unspecified: Secondary | ICD-10-CM | POA: Diagnosis not present

## 2022-11-19 DIAGNOSIS — I214 Non-ST elevation (NSTEMI) myocardial infarction: Secondary | ICD-10-CM | POA: Diagnosis not present

## 2022-11-19 DIAGNOSIS — M6281 Muscle weakness (generalized): Secondary | ICD-10-CM | POA: Diagnosis not present

## 2022-11-19 DIAGNOSIS — E119 Type 2 diabetes mellitus without complications: Secondary | ICD-10-CM | POA: Diagnosis not present

## 2022-11-19 DIAGNOSIS — M86171 Other acute osteomyelitis, right ankle and foot: Secondary | ICD-10-CM | POA: Diagnosis not present

## 2022-11-19 DIAGNOSIS — R2681 Unsteadiness on feet: Secondary | ICD-10-CM | POA: Diagnosis not present

## 2022-11-20 DIAGNOSIS — I739 Peripheral vascular disease, unspecified: Secondary | ICD-10-CM | POA: Diagnosis not present

## 2022-11-20 DIAGNOSIS — R2689 Other abnormalities of gait and mobility: Secondary | ICD-10-CM | POA: Diagnosis not present

## 2022-11-20 DIAGNOSIS — R54 Age-related physical debility: Secondary | ICD-10-CM | POA: Diagnosis not present

## 2022-11-20 DIAGNOSIS — M6281 Muscle weakness (generalized): Secondary | ICD-10-CM | POA: Diagnosis not present

## 2022-11-20 DIAGNOSIS — I5032 Chronic diastolic (congestive) heart failure: Secondary | ICD-10-CM | POA: Diagnosis not present

## 2022-11-20 DIAGNOSIS — I214 Non-ST elevation (NSTEMI) myocardial infarction: Secondary | ICD-10-CM | POA: Diagnosis not present

## 2022-11-20 DIAGNOSIS — R2681 Unsteadiness on feet: Secondary | ICD-10-CM | POA: Diagnosis not present

## 2022-11-20 DIAGNOSIS — E119 Type 2 diabetes mellitus without complications: Secondary | ICD-10-CM | POA: Diagnosis not present

## 2022-11-20 DIAGNOSIS — M86171 Other acute osteomyelitis, right ankle and foot: Secondary | ICD-10-CM | POA: Diagnosis not present

## 2022-11-21 DIAGNOSIS — E119 Type 2 diabetes mellitus without complications: Secondary | ICD-10-CM | POA: Diagnosis not present

## 2022-11-21 DIAGNOSIS — I214 Non-ST elevation (NSTEMI) myocardial infarction: Secondary | ICD-10-CM | POA: Diagnosis not present

## 2022-11-21 DIAGNOSIS — R2681 Unsteadiness on feet: Secondary | ICD-10-CM | POA: Diagnosis not present

## 2022-11-21 DIAGNOSIS — R54 Age-related physical debility: Secondary | ICD-10-CM | POA: Diagnosis not present

## 2022-11-21 DIAGNOSIS — R2689 Other abnormalities of gait and mobility: Secondary | ICD-10-CM | POA: Diagnosis not present

## 2022-11-21 DIAGNOSIS — M86171 Other acute osteomyelitis, right ankle and foot: Secondary | ICD-10-CM | POA: Diagnosis not present

## 2022-11-21 DIAGNOSIS — I5032 Chronic diastolic (congestive) heart failure: Secondary | ICD-10-CM | POA: Diagnosis not present

## 2022-11-21 DIAGNOSIS — I739 Peripheral vascular disease, unspecified: Secondary | ICD-10-CM | POA: Diagnosis not present

## 2022-11-21 DIAGNOSIS — M6281 Muscle weakness (generalized): Secondary | ICD-10-CM | POA: Diagnosis not present

## 2022-11-22 DIAGNOSIS — I214 Non-ST elevation (NSTEMI) myocardial infarction: Secondary | ICD-10-CM | POA: Diagnosis not present

## 2022-11-22 DIAGNOSIS — I739 Peripheral vascular disease, unspecified: Secondary | ICD-10-CM | POA: Diagnosis not present

## 2022-11-22 DIAGNOSIS — M86171 Other acute osteomyelitis, right ankle and foot: Secondary | ICD-10-CM | POA: Diagnosis not present

## 2022-11-22 DIAGNOSIS — M6281 Muscle weakness (generalized): Secondary | ICD-10-CM | POA: Diagnosis not present

## 2022-11-22 DIAGNOSIS — R2689 Other abnormalities of gait and mobility: Secondary | ICD-10-CM | POA: Diagnosis not present

## 2022-11-22 DIAGNOSIS — R2681 Unsteadiness on feet: Secondary | ICD-10-CM | POA: Diagnosis not present

## 2022-11-22 DIAGNOSIS — I5032 Chronic diastolic (congestive) heart failure: Secondary | ICD-10-CM | POA: Diagnosis not present

## 2022-11-22 DIAGNOSIS — E119 Type 2 diabetes mellitus without complications: Secondary | ICD-10-CM | POA: Diagnosis not present

## 2022-11-22 DIAGNOSIS — R54 Age-related physical debility: Secondary | ICD-10-CM | POA: Diagnosis not present

## 2022-11-23 DIAGNOSIS — E119 Type 2 diabetes mellitus without complications: Secondary | ICD-10-CM | POA: Diagnosis not present

## 2022-11-23 DIAGNOSIS — I70245 Atherosclerosis of native arteries of left leg with ulceration of other part of foot: Secondary | ICD-10-CM | POA: Diagnosis not present

## 2022-11-23 DIAGNOSIS — I70235 Atherosclerosis of native arteries of right leg with ulceration of other part of foot: Secondary | ICD-10-CM | POA: Diagnosis not present

## 2022-11-23 DIAGNOSIS — L89302 Pressure ulcer of unspecified buttock, stage 2: Secondary | ICD-10-CM | POA: Diagnosis not present

## 2022-11-23 DIAGNOSIS — L89616 Pressure-induced deep tissue damage of right heel: Secondary | ICD-10-CM | POA: Diagnosis not present

## 2022-11-23 DIAGNOSIS — L89620 Pressure ulcer of left heel, unstageable: Secondary | ICD-10-CM | POA: Diagnosis not present

## 2022-11-30 DIAGNOSIS — L89302 Pressure ulcer of unspecified buttock, stage 2: Secondary | ICD-10-CM | POA: Diagnosis not present

## 2022-11-30 DIAGNOSIS — L89616 Pressure-induced deep tissue damage of right heel: Secondary | ICD-10-CM | POA: Diagnosis not present

## 2022-11-30 DIAGNOSIS — L89620 Pressure ulcer of left heel, unstageable: Secondary | ICD-10-CM | POA: Diagnosis not present

## 2022-11-30 DIAGNOSIS — I70235 Atherosclerosis of native arteries of right leg with ulceration of other part of foot: Secondary | ICD-10-CM | POA: Diagnosis not present

## 2022-11-30 DIAGNOSIS — I70245 Atherosclerosis of native arteries of left leg with ulceration of other part of foot: Secondary | ICD-10-CM | POA: Diagnosis not present

## 2022-12-05 IMAGING — CR DG CHEST 2V
2 series · 2 of 2 positions shown · non-contrast
Comparison: 01/08/2020

CLINICAL DATA: Sudden onset of chest pain

EXAM:
CHEST - 2 VIEW

[chest pa]
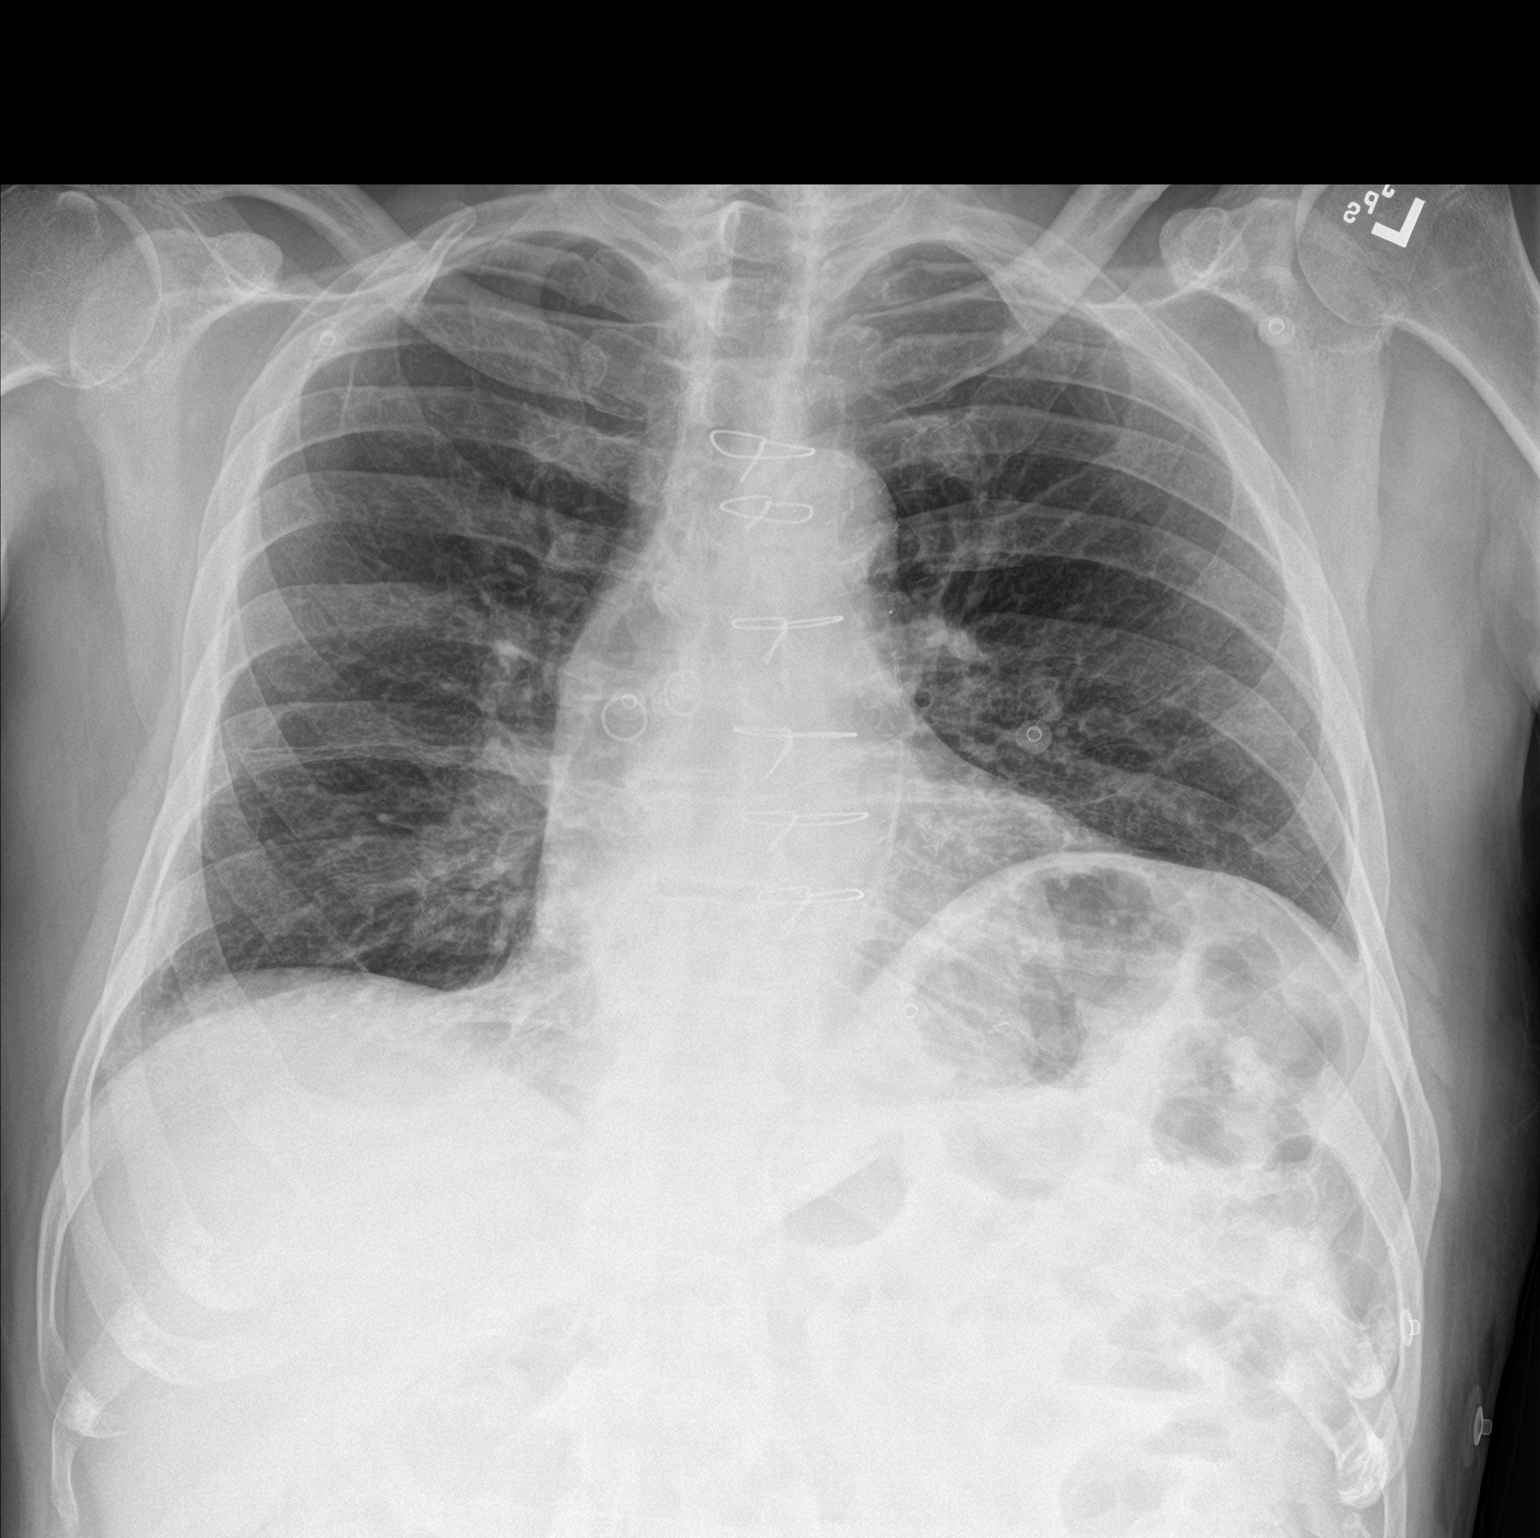

[chest lat]
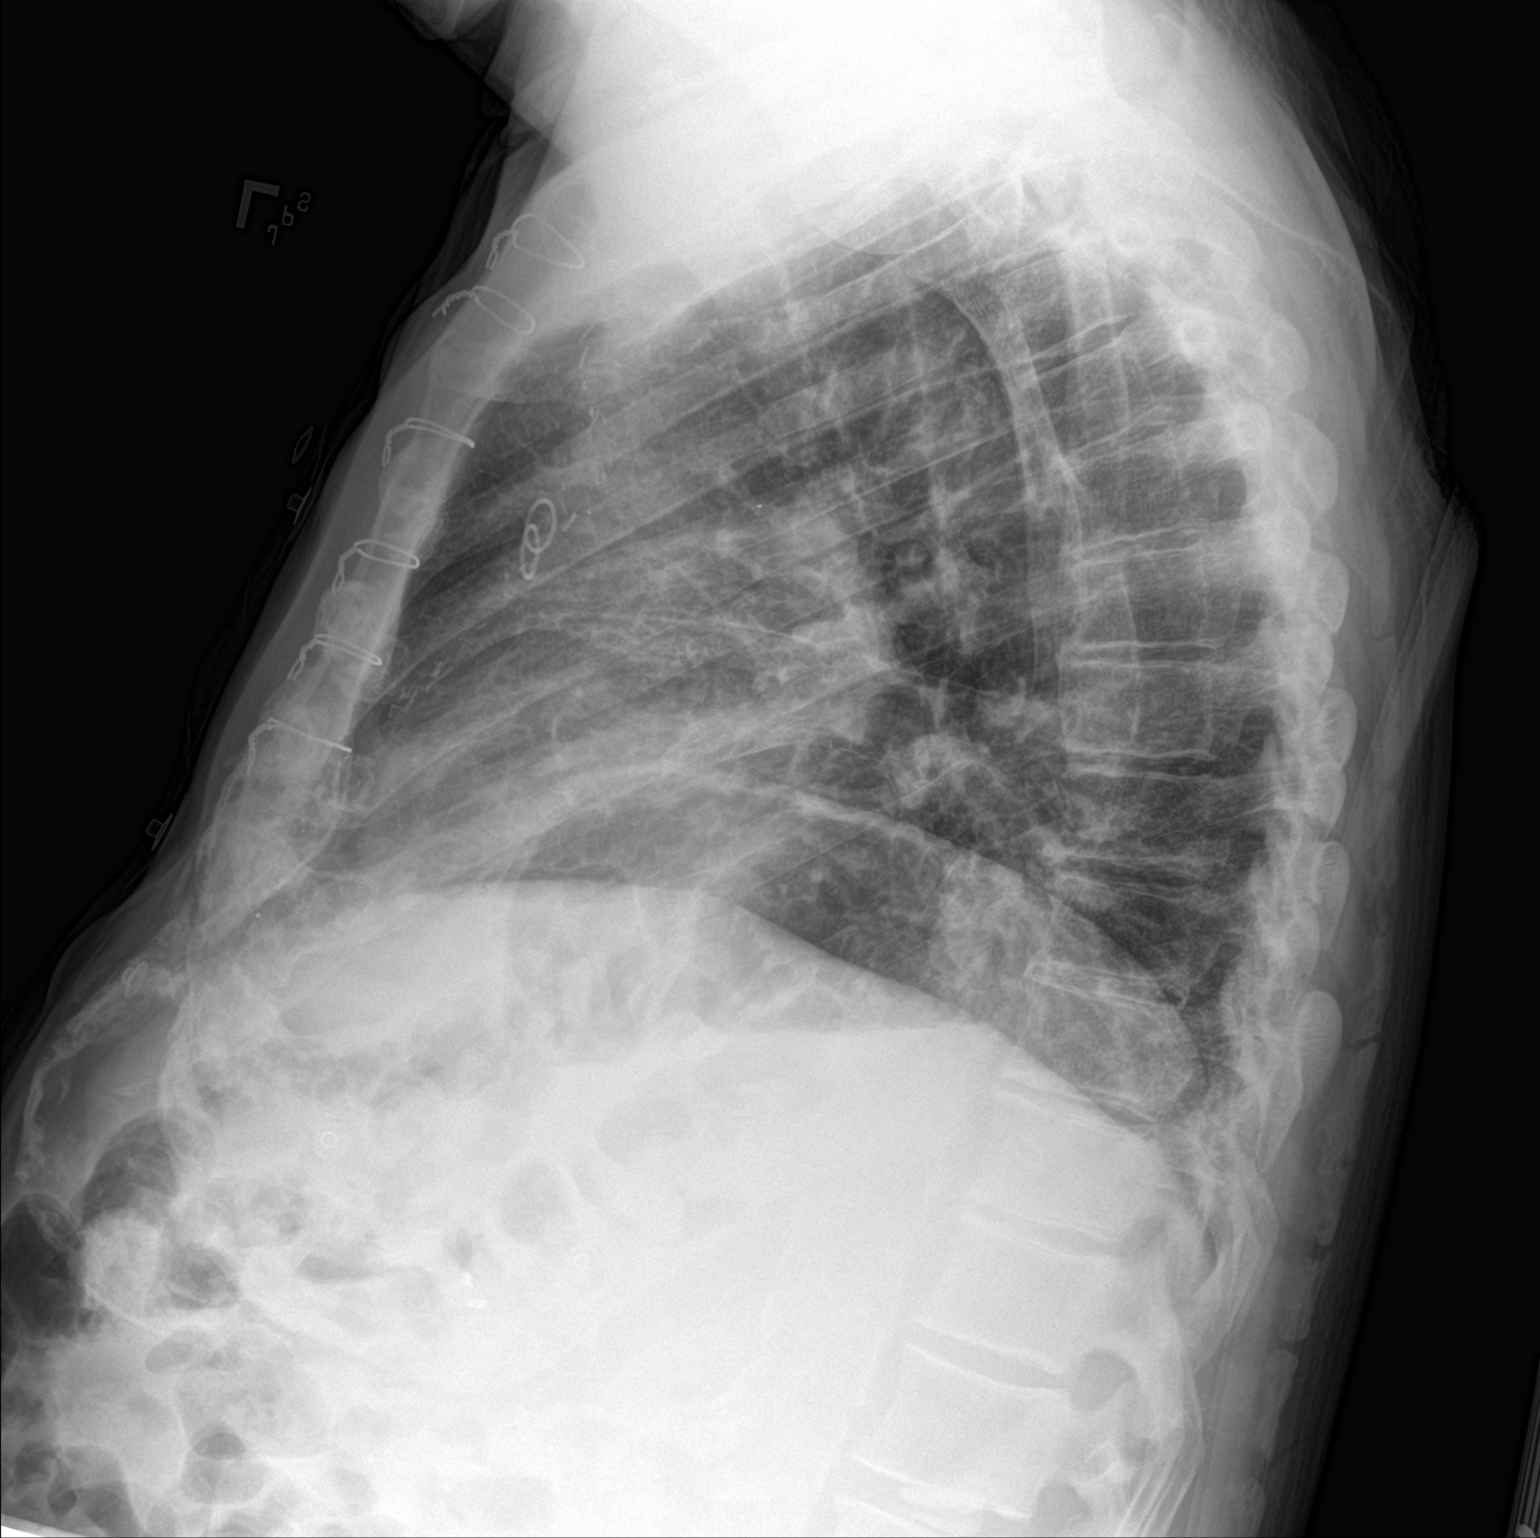

[2 of 2 positions shown; findings below may reference images not displayed]

FINDINGS: Chronic elevation of the left diaphragm. Chronic generalized
interstitial coarsening. Normal heart size and stable mediastinal
contours. CABG. There is no edema, consolidation, effusion, or
pneumothorax.
IMPRESSION: Stable from prior.  No evidence of acute disease.

## 2022-12-07 DIAGNOSIS — N3946 Mixed incontinence: Secondary | ICD-10-CM | POA: Diagnosis not present

## 2022-12-07 DIAGNOSIS — N3001 Acute cystitis with hematuria: Secondary | ICD-10-CM | POA: Diagnosis not present

## 2022-12-07 DIAGNOSIS — C61 Malignant neoplasm of prostate: Secondary | ICD-10-CM | POA: Diagnosis not present

## 2022-12-14 DIAGNOSIS — L89620 Pressure ulcer of left heel, unstageable: Secondary | ICD-10-CM | POA: Diagnosis not present

## 2022-12-14 DIAGNOSIS — L89302 Pressure ulcer of unspecified buttock, stage 2: Secondary | ICD-10-CM | POA: Diagnosis not present

## 2022-12-14 DIAGNOSIS — L89616 Pressure-induced deep tissue damage of right heel: Secondary | ICD-10-CM | POA: Diagnosis not present

## 2022-12-14 DIAGNOSIS — I70235 Atherosclerosis of native arteries of right leg with ulceration of other part of foot: Secondary | ICD-10-CM | POA: Diagnosis not present

## 2022-12-14 DIAGNOSIS — I70245 Atherosclerosis of native arteries of left leg with ulceration of other part of foot: Secondary | ICD-10-CM | POA: Diagnosis not present

## 2022-12-21 DIAGNOSIS — I70235 Atherosclerosis of native arteries of right leg with ulceration of other part of foot: Secondary | ICD-10-CM | POA: Diagnosis not present

## 2022-12-21 DIAGNOSIS — L89620 Pressure ulcer of left heel, unstageable: Secondary | ICD-10-CM | POA: Diagnosis not present

## 2022-12-21 DIAGNOSIS — I70245 Atherosclerosis of native arteries of left leg with ulceration of other part of foot: Secondary | ICD-10-CM | POA: Diagnosis not present

## 2022-12-21 DIAGNOSIS — M86171 Other acute osteomyelitis, right ankle and foot: Secondary | ICD-10-CM | POA: Diagnosis not present

## 2022-12-21 DIAGNOSIS — R2681 Unsteadiness on feet: Secondary | ICD-10-CM | POA: Diagnosis not present

## 2022-12-21 DIAGNOSIS — R2689 Other abnormalities of gait and mobility: Secondary | ICD-10-CM | POA: Diagnosis not present

## 2022-12-21 DIAGNOSIS — R54 Age-related physical debility: Secondary | ICD-10-CM | POA: Diagnosis not present

## 2022-12-21 DIAGNOSIS — L89616 Pressure-induced deep tissue damage of right heel: Secondary | ICD-10-CM | POA: Diagnosis not present

## 2022-12-21 DIAGNOSIS — L89302 Pressure ulcer of unspecified buttock, stage 2: Secondary | ICD-10-CM | POA: Diagnosis not present

## 2022-12-21 DIAGNOSIS — M6281 Muscle weakness (generalized): Secondary | ICD-10-CM | POA: Diagnosis not present

## 2022-12-22 DIAGNOSIS — R2681 Unsteadiness on feet: Secondary | ICD-10-CM | POA: Diagnosis not present

## 2022-12-22 DIAGNOSIS — M6281 Muscle weakness (generalized): Secondary | ICD-10-CM | POA: Diagnosis not present

## 2022-12-22 DIAGNOSIS — R54 Age-related physical debility: Secondary | ICD-10-CM | POA: Diagnosis not present

## 2022-12-22 DIAGNOSIS — M86171 Other acute osteomyelitis, right ankle and foot: Secondary | ICD-10-CM | POA: Diagnosis not present

## 2022-12-22 DIAGNOSIS — R2689 Other abnormalities of gait and mobility: Secondary | ICD-10-CM | POA: Diagnosis not present

## 2022-12-23 DIAGNOSIS — I739 Peripheral vascular disease, unspecified: Secondary | ICD-10-CM | POA: Diagnosis not present

## 2022-12-23 DIAGNOSIS — D649 Anemia, unspecified: Secondary | ICD-10-CM | POA: Diagnosis not present

## 2022-12-23 DIAGNOSIS — E039 Hypothyroidism, unspecified: Secondary | ICD-10-CM | POA: Diagnosis not present

## 2022-12-23 DIAGNOSIS — E441 Mild protein-calorie malnutrition: Secondary | ICD-10-CM | POA: Diagnosis not present

## 2022-12-26 DIAGNOSIS — J449 Chronic obstructive pulmonary disease, unspecified: Secondary | ICD-10-CM | POA: Diagnosis not present

## 2022-12-26 DIAGNOSIS — I5032 Chronic diastolic (congestive) heart failure: Secondary | ICD-10-CM | POA: Diagnosis not present

## 2022-12-26 DIAGNOSIS — R2689 Other abnormalities of gait and mobility: Secondary | ICD-10-CM | POA: Diagnosis not present

## 2022-12-26 DIAGNOSIS — R2681 Unsteadiness on feet: Secondary | ICD-10-CM | POA: Diagnosis not present

## 2022-12-26 DIAGNOSIS — R54 Age-related physical debility: Secondary | ICD-10-CM | POA: Diagnosis not present

## 2022-12-26 DIAGNOSIS — M6281 Muscle weakness (generalized): Secondary | ICD-10-CM | POA: Diagnosis not present

## 2022-12-27 DIAGNOSIS — R300 Dysuria: Secondary | ICD-10-CM | POA: Diagnosis not present

## 2022-12-28 DIAGNOSIS — L89620 Pressure ulcer of left heel, unstageable: Secondary | ICD-10-CM | POA: Diagnosis not present

## 2022-12-28 DIAGNOSIS — I70245 Atherosclerosis of native arteries of left leg with ulceration of other part of foot: Secondary | ICD-10-CM | POA: Diagnosis not present

## 2022-12-28 DIAGNOSIS — I70235 Atherosclerosis of native arteries of right leg with ulceration of other part of foot: Secondary | ICD-10-CM | POA: Diagnosis not present

## 2022-12-28 DIAGNOSIS — L89616 Pressure-induced deep tissue damage of right heel: Secondary | ICD-10-CM | POA: Diagnosis not present

## 2022-12-28 DIAGNOSIS — L89302 Pressure ulcer of unspecified buttock, stage 2: Secondary | ICD-10-CM | POA: Diagnosis not present

## 2022-12-30 DIAGNOSIS — M6281 Muscle weakness (generalized): Secondary | ICD-10-CM | POA: Diagnosis not present

## 2022-12-30 DIAGNOSIS — I5032 Chronic diastolic (congestive) heart failure: Secondary | ICD-10-CM | POA: Diagnosis not present

## 2022-12-30 DIAGNOSIS — J449 Chronic obstructive pulmonary disease, unspecified: Secondary | ICD-10-CM | POA: Diagnosis not present

## 2022-12-30 DIAGNOSIS — R2681 Unsteadiness on feet: Secondary | ICD-10-CM | POA: Diagnosis not present

## 2022-12-30 DIAGNOSIS — R54 Age-related physical debility: Secondary | ICD-10-CM | POA: Diagnosis not present

## 2022-12-30 DIAGNOSIS — R2689 Other abnormalities of gait and mobility: Secondary | ICD-10-CM | POA: Diagnosis not present

## 2022-12-31 DIAGNOSIS — R2681 Unsteadiness on feet: Secondary | ICD-10-CM | POA: Diagnosis not present

## 2022-12-31 DIAGNOSIS — M6281 Muscle weakness (generalized): Secondary | ICD-10-CM | POA: Diagnosis not present

## 2022-12-31 DIAGNOSIS — R2689 Other abnormalities of gait and mobility: Secondary | ICD-10-CM | POA: Diagnosis not present

## 2022-12-31 DIAGNOSIS — J449 Chronic obstructive pulmonary disease, unspecified: Secondary | ICD-10-CM | POA: Diagnosis not present

## 2022-12-31 DIAGNOSIS — I5032 Chronic diastolic (congestive) heart failure: Secondary | ICD-10-CM | POA: Diagnosis not present

## 2022-12-31 DIAGNOSIS — R54 Age-related physical debility: Secondary | ICD-10-CM | POA: Diagnosis not present

## 2023-01-02 DIAGNOSIS — I5032 Chronic diastolic (congestive) heart failure: Secondary | ICD-10-CM | POA: Diagnosis not present

## 2023-01-02 DIAGNOSIS — R54 Age-related physical debility: Secondary | ICD-10-CM | POA: Diagnosis not present

## 2023-01-02 DIAGNOSIS — M6281 Muscle weakness (generalized): Secondary | ICD-10-CM | POA: Diagnosis not present

## 2023-01-02 DIAGNOSIS — R2689 Other abnormalities of gait and mobility: Secondary | ICD-10-CM | POA: Diagnosis not present

## 2023-01-02 DIAGNOSIS — J449 Chronic obstructive pulmonary disease, unspecified: Secondary | ICD-10-CM | POA: Diagnosis not present

## 2023-01-02 DIAGNOSIS — R2681 Unsteadiness on feet: Secondary | ICD-10-CM | POA: Diagnosis not present

## 2023-01-04 DIAGNOSIS — I70235 Atherosclerosis of native arteries of right leg with ulceration of other part of foot: Secondary | ICD-10-CM | POA: Diagnosis not present

## 2023-01-04 DIAGNOSIS — L89620 Pressure ulcer of left heel, unstageable: Secondary | ICD-10-CM | POA: Diagnosis not present

## 2023-01-04 DIAGNOSIS — L89616 Pressure-induced deep tissue damage of right heel: Secondary | ICD-10-CM | POA: Diagnosis not present

## 2023-01-04 DIAGNOSIS — L89302 Pressure ulcer of unspecified buttock, stage 2: Secondary | ICD-10-CM | POA: Diagnosis not present

## 2023-01-04 DIAGNOSIS — I70245 Atherosclerosis of native arteries of left leg with ulceration of other part of foot: Secondary | ICD-10-CM | POA: Diagnosis not present

## 2023-01-11 DIAGNOSIS — I70235 Atherosclerosis of native arteries of right leg with ulceration of other part of foot: Secondary | ICD-10-CM | POA: Diagnosis not present

## 2023-01-11 DIAGNOSIS — L89616 Pressure-induced deep tissue damage of right heel: Secondary | ICD-10-CM | POA: Diagnosis not present

## 2023-01-11 DIAGNOSIS — I70245 Atherosclerosis of native arteries of left leg with ulceration of other part of foot: Secondary | ICD-10-CM | POA: Diagnosis not present

## 2023-01-11 DIAGNOSIS — L89620 Pressure ulcer of left heel, unstageable: Secondary | ICD-10-CM | POA: Diagnosis not present

## 2023-01-11 DIAGNOSIS — L89302 Pressure ulcer of unspecified buttock, stage 2: Secondary | ICD-10-CM | POA: Diagnosis not present

## 2023-01-18 DIAGNOSIS — I70245 Atherosclerosis of native arteries of left leg with ulceration of other part of foot: Secondary | ICD-10-CM | POA: Diagnosis not present

## 2023-01-18 DIAGNOSIS — L89616 Pressure-induced deep tissue damage of right heel: Secondary | ICD-10-CM | POA: Diagnosis not present

## 2023-01-18 DIAGNOSIS — L89620 Pressure ulcer of left heel, unstageable: Secondary | ICD-10-CM | POA: Diagnosis not present

## 2023-01-24 DIAGNOSIS — E441 Mild protein-calorie malnutrition: Secondary | ICD-10-CM | POA: Diagnosis not present

## 2023-01-24 DIAGNOSIS — I2510 Atherosclerotic heart disease of native coronary artery without angina pectoris: Secondary | ICD-10-CM | POA: Diagnosis not present

## 2023-01-24 DIAGNOSIS — E039 Hypothyroidism, unspecified: Secondary | ICD-10-CM | POA: Diagnosis not present

## 2023-01-24 DIAGNOSIS — R5381 Other malaise: Secondary | ICD-10-CM | POA: Diagnosis not present

## 2023-01-25 DIAGNOSIS — L89620 Pressure ulcer of left heel, unstageable: Secondary | ICD-10-CM | POA: Diagnosis not present

## 2023-01-25 DIAGNOSIS — L89616 Pressure-induced deep tissue damage of right heel: Secondary | ICD-10-CM | POA: Diagnosis not present

## 2023-01-25 DIAGNOSIS — I70245 Atherosclerosis of native arteries of left leg with ulceration of other part of foot: Secondary | ICD-10-CM | POA: Diagnosis not present

## 2023-01-29 NOTE — Progress Notes (Deleted)
Cardiology Office Note:  .   Date:  01/29/2023  ID:  Alexander Choi, DOB [birth year], MRN [id number] PCP: Paulina Fusi, MD  [facility] Providers Cardiologist:  Peter Swaziland, MD { Click to update primary MD,subspecialty MD or APP then REFRESH:1}   History of Present Illness: .   Alexander Choi is a 89 y.o. male with a history of CAD s/p CABG x 3 (LIMA to LAD, SVG to mid and distal OM, SVG to AM) in 2006, paroxysmal atrial tachycardia, mild aortic stenosis, hypertension, hyperlipidemia, PAD, type 2 diabetes, and GERD who presents for follow-up related to CAD.   In 2015 [name] had a Myoview that showed inferior ischemia.  Follow-up cardiac catheterization showed all grafts patent with severe disease these in the native left circumflex that predated his CABG.  In October 2020 he was hospitalized again.  Cardiac catheterization at that time revealed elevated right heart pressures, stable coronary anatomy with attempt to open chronically occluded left circumflex, however this was unsuccessful due to inability to cross the lesion with a wire.  ABIs and arterial duplex in 2022 revealed PAD, occluded SFAs.  He was hospitalized in October 2022 in the setting of NSTEMI.  Repeat cardiac catheterization showed continued patency of 3 of 3 grafts, severely diseased, calcified and aneurysmal left circumflex artery.  Flow appeared worse compared to prior study in the left circumflex, however there were no targets for PCI.  Ongoing medical therapy was recommended.  He has not tolerated higher doses of Ranexa.  Additionally he has not tolerated nitrates in the past due to side effects.  He was seen in office on 08/08/2022 and was referred to vascular surgery by wound care clinic due to nonhealing wound to his right foot.  Peripheral angiogram demonstrated long segment femoral-popliteal disease with anterior tibial artery reconstitution.  He was not considered a candidate for percutaneous intervention.  He  was hospitalized in March 2023, records not available for review.  Echocardiogram at [facility] in 08/2022 showed EF 50 to 55%, basal inferior wall hypokinesis, impaired diastolic dysfunction, normal RV systolic function and mild aortic stenosis.  [facility].  Since his discharge he reported continued generalized weakness and reported increased dyspnea on exertion felt to be similar to his prior anginal equivalent.  In 09/2022 his amlodipine was increased.  Following his office visit in April he contracted COVID-19 and developed a UTI.  At his office visit in 5/24 with Dr. Swaziland he was still noted as having a nonhealing ulcer on his left foot and he had lost 25 pounds since February.  Given concern for overmedication with failing health Dr. Swaziland reduced his amlodipine to 5 mg daily and reduced his Lasix to 20 mg daily.  CAD: s/p CABG x3 (LIMA to LAD, SVG to mid and distal OMG, SVG to AM) in 2006. Most recent cath in 2022 showed continued patency of 3 of 3 grafts, severely diseased, calcified and aneurysmal left circumflex artery. Flow appeared worse compared to prior study in the left circumflex, however there are no targets for PCI.  Options are limited for medical management as he has not tolerated higher doses of Ranexa in the past.  Additionally he has an allergy to nitrates.  At last office visit Dr. Swaziland reduced his amlodipine to 5 mg daily and his Lasix to 20 mg daily. Discuss ED precautions  Continue aspirin, amlodipine, Ranexa and Lipitor. Aortic stenosis: Echocardiogram from 08/2022 at [facility] indicated EF  50 to 55%, basal inferior wall hypokinesis, impaired diastolic dysfunction, normal RV systolic function, mild aortic stenosis. Hypertension: Blood pressure today  Continue  Hyperlipidemia: Panel in 07/2022 indicated LDL of 70.  Continue Lipitor. PAD: Follows with vascular surgery. Worsening claudication?  Continue aspirin and lipitor  Paroxysmal  atrial tachycardia:  Recent palpitations? EKG  Type 2 diabetes mellitus: In 07/2022 A1c was 8.4. Monitored and managed by PCP.   ROS: ***  Studies Reviewed: .       Cardiac Studies & Procedures   CARDIAC CATHETERIZATION  CARDIAC CATHETERIZATION 04/09/2021  Narrative   Mid LM lesion is 35% stenosed.   Ost LAD to Mid LAD lesion is 100% stenosed.   Dist LAD-1 lesion is 60% stenosed.   Dist LAD-2 lesion is 90% stenosed.   Prox RCA to Dist RCA lesion is 100% stenosed.   1st Mrg lesion is 100% stenosed.   2nd Mrg lesion is 75% stenosed.   Prox Cx lesion is 99% stenosed.   Prox Cx to Mid Cx lesion is 95% stenosed.   LIMA and is normal in caliber.   and is normal in caliber.   and is large.   The graft exhibits no disease.   The graft exhibits minimal luminal irregularities.   The graft exhibits mild .   The left ventricular systolic function is normal.   LV end diastolic pressure is normal.   The left ventricular ejection fraction is 55-65% by visual estimate.  Severe 3 vessel obstructive CAD Continued patency of bypass grafts including LIMA to LAD, SVG to OM1, SVG to RV marginal vessel Normal LV function Normal LVEDP  Plan: I suspect his culprit is the native LCx. This vessel is severely diseased, calcified and aneurysmal. Flow appears worse compared to prior study. We have attempted PCI of this in the past but unable to cross with a wire. There are no targets for PCI. Will continue medical therapy. Intolerant of higher Ranexa dose. Will increase amlodipine.  Findings Coronary Findings Diagnostic  Dominance: Right  Left Main Mid LM lesion is 35% stenosed.  Left Anterior Descending Ost LAD to Mid LAD lesion is 100% stenosed. The lesion is severely calcified. Dist LAD-1 lesion is 60% stenosed. Dist LAD-2 lesion is 90% stenosed.  First Diagonal Branch Collaterals 1st Diag filled by collaterals from 1st Mrg.  Left Circumflex Prox Cx lesion is 99% stenosed. The lesion  is severely calcified. Prox Cx to Mid Cx lesion is 95% stenosed. The lesion is severely calcified.  First Obtuse Marginal Branch Vessel is large in size. 1st Mrg lesion is 100% stenosed.  Second Obtuse Marginal Branch 2nd Mrg lesion is 75% stenosed.  Right Coronary Artery Prox RCA to Dist RCA lesion is 100% stenosed. The lesion is severely calcified.  Right Posterior Atrioventricular Artery Collaterals RPAV filled by collaterals from Dist Cx.  LIMA LIMA Graft To Dist LAD LIMA and is normal in caliber.  The graft exhibits no disease.  Saphenous Graft To 1st Mrg And is large.  The graft exhibits minimal luminal irregularities.  Saphenous Graft To RV Branch And is normal in caliber.  The graft exhibits mild .  Intervention  No interventions have been documented.   CARDIAC CATHETERIZATION  CARDIAC CATHETERIZATION 12/13/2019  Narrative  Dist LAD-1 lesion is 60% stenosed.  Prox Cx lesion is 99% stenosed.  Prox Cx to Mid Cx lesion is 90% stenosed.  2nd Mrg lesion is 75% stenosed.  Unsuccessful PCI due to inability to cross with a wire.  1. Unsuccessful  PCI of the LCx due to inability to cross the lesion with a wire.  Plan: medical therapy. ASA only. Will increase amlodipine to 5 mg daily. Consider Ranexa if symptoms persists. Anticipate DC tomorrow am.  Findings Coronary Findings Diagnostic  Dominance: Right  Left Main  Left Anterior Descending Severely Calcified.  First Diagonal Branch Collaterals 1st Diag filled by collaterals from 1st Mrg.  Left Circumflex Severely Calcified. Severely Calcified.  First Obtuse Marginal Branch The vessel is large in size.  Second Obtuse Marginal Branch  Right Coronary Artery Severely Calcified.  Right Posterior Atrioventricular Artery Collaterals RPAV filled by collaterals from Dist Cx.  LIMA LIMA Graft To Dist LAD LIMA is normal in caliber, and is anatomically normal.  Saphenous Graft To 1st Mrg is large.  The graft exhibits minimal luminal irregularities.  Saphenous Graft To RV Branch is normal in caliber. There is mild disease in the graft.  Intervention  Prox Cx lesion Angioplasty (Also treats lesions: Prox Cx to Mid Cx) The intervention was unsuccessful due to an inability to cross the lesion with wire. There is a 99% residual stenosis post intervention.  Prox Cx to Mid Cx lesion Angioplasty (Also treats lesions: Prox Cx) See details in Prox Cx lesion. There is a 90% residual stenosis post intervention.     ECHOCARDIOGRAM  ECHOCARDIOGRAM COMPLETE 04/10/2021  Narrative ECHOCARDIOGRAM REPORT    Patient Name:   AAHIL FREDIN Date of Exam: 04/10/2021 Medical Rec #:  [id number]       Height:       70.0 in Accession #:    [id number]      Weight:       182.3 lb Date of Birth:  06-Feb-1933        BSA:          2.007 m Patient Age:    88 years        BP:           112/54 mmHg Patient Gender: M               HR:           76 bpm. Exam Location:  Inpatient  Procedure: 2D Echo, Cardiac Doppler, Color Doppler and Intracardiac Opacification Agent  Indications:    Chest Pain  History:        Patient has prior history of Echocardiogram examinations. CAD; Prior CABG and Cath yesterday.  Sonographer:    Roosvelt Maser RDCS Referring Phys: [id number] HAO MENG  IMPRESSIONS   1. Left ventricular ejection fraction, by estimation, is 60 to 65%. The left ventricle has normal function. The left ventricle has no regional wall motion abnormalities. Left ventricular diastolic parameters are consistent with Grade I diastolic dysfunction (impaired relaxation). 2. Right ventricular systolic function is normal. The right ventricular size is normal. 3. The mitral valve is abnormal. Trivial mitral valve regurgitation. No evidence of mitral stenosis. 4. The aortic valve was not well visualized. Aortic valve regurgitation is not visualized. Mild to moderate aortic valve sclerosis/calcification is  present, without any evidence of aortic stenosis. 5. The inferior vena cava is normal in size with greater than 50% respiratory variability, suggesting right atrial pressure of 3 mmHg.  FINDINGS Left Ventricle: Left ventricular ejection fraction, by estimation, is 60 to 65%. The left ventricle has normal function. The left ventricle has no regional wall motion abnormalities. The left ventricular internal cavity size was normal in size. There is no left ventricular hypertrophy. Left ventricular diastolic parameters are consistent with  Grade I diastolic dysfunction (impaired relaxation).  Right Ventricle: The right ventricular size is normal. No increase in right ventricular wall thickness. Right ventricular systolic function is normal.  Left Atrium: Left atrial size was normal in size.  Right Atrium: Right atrial size was normal in size.  Pericardium: There is no evidence of pericardial effusion.  Mitral Valve: The mitral valve is abnormal. There is mild thickening of the mitral valve leaflet(s). There is mild calcification of the mitral valve leaflet(s). Mild mitral annular calcification. Trivial mitral valve regurgitation. No evidence of mitral valve stenosis.  Tricuspid Valve: The tricuspid valve is normal in structure. Tricuspid valve regurgitation is not demonstrated. No evidence of tricuspid stenosis.  Aortic Valve: The aortic valve was not well visualized. Aortic valve regurgitation is not visualized. Mild to moderate aortic valve sclerosis/calcification is present, without any evidence of aortic stenosis. Aortic valve mean gradient measures 3.0 mmHg. Aortic valve peak gradient measures 5.0 mmHg. Aortic valve area, by VTI measures 2.30 cm.  Pulmonic Valve: The pulmonic valve was normal in structure. Pulmonic valve regurgitation is not visualized. No evidence of pulmonic stenosis.  Aorta: The aortic root is normal in size and structure.  Venous: The inferior vena cava is normal in  size with greater than 50% respiratory variability, suggesting right atrial pressure of 3 mmHg.  IAS/Shunts: No atrial level shunt detected by color flow Doppler.   LEFT VENTRICLE PLAX 2D LVOT diam:     2.00 cm   Diastology LV SV:         49        LV e' medial:    5.11 cm/s LV SV Index:   24        LV E/e' medial:  7.2 LVOT Area:     3.14 cm  LV e' lateral:   9.79 cm/s LV E/e' lateral: 3.8   RIGHT VENTRICLE RV Basal diam:  3.30 cm  LEFT ATRIUM              Index        RIGHT ATRIUM           Index LA Vol (A2C):   107.0 ml 53.32 ml/m  RA Area:     15.90 cm LA Vol (A4C):   59.0 ml  29.40 ml/m  RA Volume:   35.00 ml  17.44 ml/m LA Biplane Vol: 84.9 ml  42.31 ml/m AORTIC VALVE AV Area (Vmax):    2.83 cm AV Area (Vmean):   2.61 cm AV Area (VTI):     2.30 cm AV Vmax:           112.00 cm/s AV Vmean:          76.800 cm/s AV VTI:            0.213 m AV Peak Grad:      5.0 mmHg AV Mean Grad:      3.0 mmHg LVOT Vmax:         101.00 cm/s LVOT Vmean:        63.800 cm/s LVOT VTI:          0.156 m LVOT/AV VTI ratio: 0.73  MITRAL VALVE MV Area (PHT): 2.93 cm     SHUNTS MV Decel Time: 259 msec     Systemic VTI:  0.16 m MV E velocity: 36.80 cm/s   Systemic Diam: 2.00 cm MV A velocity: 106.00 cm/s MV E/A ratio:  0.35  Charlton Haws MD Electronically signed by Charlton Haws MD Signature Date/Time: 04/10/2021/2:34:43  PM    Final    MONITORS  LONG TERM MONITOR-LIVE TELEMETRY (3-14 DAYS) 12/16/2019  Narrative  Normal sinus rhythm  Occasional PACs.  Occasional brief runs of PAT longest lasting 18.5 seconds.           *** Risk Assessment/Calculations:   {Does this patient have ATRIAL FIBRILLATION?:[id number]} No BP recorded.  {Refresh Note OR Click here to enter BP  :1}***       Physical Exam:   VS:  There were no vitals taken for this visit.   Wt Readings from Last 3 Encounters:  11/01/22 162 lb (73.5 kg)  10/24/22 167 lb 15.9 oz (76.2 kg)  09/29/22 168 lb  (76.2 kg)    GEN: Well nourished, well developed in no acute distress NECK: No JVD; No carotid bruits CARDIAC: ***RRR, no murmurs, rubs, gallops RESPIRATORY:  Clear to auscultation without rales, wheezing or rhonchi  ABDOMEN: Soft, non-tender, non-distended EXTREMITIES:  No edema; No deformity   ASSESSMENT AND PLAN: .   ***    {Are you ordering a CV Procedure (e.g. stress test, cath, DCCV, TEE, etc)?   Press F2        :[id number]}  Dispo: ***  Signed, Rip Harbour, NP

## 2023-01-30 ENCOUNTER — Ambulatory Visit: Payer: Medicare PPO | Admitting: Nurse Practitioner

## 2023-02-01 DIAGNOSIS — I70245 Atherosclerosis of native arteries of left leg with ulceration of other part of foot: Secondary | ICD-10-CM | POA: Diagnosis not present

## 2023-02-01 DIAGNOSIS — L89620 Pressure ulcer of left heel, unstageable: Secondary | ICD-10-CM | POA: Diagnosis not present

## 2023-02-01 DIAGNOSIS — L89616 Pressure-induced deep tissue damage of right heel: Secondary | ICD-10-CM | POA: Diagnosis not present

## 2023-02-01 DIAGNOSIS — L89323 Pressure ulcer of left buttock, stage 3: Secondary | ICD-10-CM | POA: Diagnosis not present

## 2023-02-08 DIAGNOSIS — L89616 Pressure-induced deep tissue damage of right heel: Secondary | ICD-10-CM | POA: Diagnosis not present

## 2023-02-08 DIAGNOSIS — I70245 Atherosclerosis of native arteries of left leg with ulceration of other part of foot: Secondary | ICD-10-CM | POA: Diagnosis not present

## 2023-02-08 DIAGNOSIS — L89323 Pressure ulcer of left buttock, stage 3: Secondary | ICD-10-CM | POA: Diagnosis not present

## 2023-02-08 DIAGNOSIS — L89620 Pressure ulcer of left heel, unstageable: Secondary | ICD-10-CM | POA: Diagnosis not present

## 2023-02-20 ENCOUNTER — Ambulatory Visit: Payer: Medicare PPO | Admitting: Cardiology

## 2023-02-26 DEATH — deceased
